# Patient Record
Sex: Male | Born: 1955 | Race: White | Hispanic: No | Marital: Single | Smoking: Current every day smoker
Health system: Southern US, Community
[De-identification: ages and names within clinical notes are randomized; demographics above are authoritative.]

## PROBLEM LIST (undated history)

## (undated) DIAGNOSIS — I319 Disease of pericardium, unspecified: Secondary | ICD-10-CM

## (undated) DIAGNOSIS — I5020 Unspecified systolic (congestive) heart failure: Secondary | ICD-10-CM

## (undated) DIAGNOSIS — J984 Other disorders of lung: Secondary | ICD-10-CM

## (undated) DIAGNOSIS — Z7901 Long term (current) use of anticoagulants: Secondary | ICD-10-CM

## (undated) DIAGNOSIS — N400 Enlarged prostate without lower urinary tract symptoms: Secondary | ICD-10-CM

## (undated) DIAGNOSIS — I639 Cerebral infarction, unspecified: Secondary | ICD-10-CM

## (undated) DIAGNOSIS — M5030 Other cervical disc degeneration, unspecified cervical region: Secondary | ICD-10-CM

## (undated) DIAGNOSIS — I7123 Aneurysm of the descending thoracic aorta, without rupture: Secondary | ICD-10-CM

## (undated) DIAGNOSIS — F329 Major depressive disorder, single episode, unspecified: Secondary | ICD-10-CM

## (undated) DIAGNOSIS — Q245 Malformation of coronary vessels: Secondary | ICD-10-CM

## (undated) DIAGNOSIS — E785 Hyperlipidemia, unspecified: Secondary | ICD-10-CM

## (undated) DIAGNOSIS — M51379 Other intervertebral disc degeneration, lumbosacral region without mention of lumbar back pain or lower extremity pain: Secondary | ICD-10-CM

## (undated) DIAGNOSIS — M419 Scoliosis, unspecified: Secondary | ICD-10-CM

## (undated) DIAGNOSIS — F32A Depression, unspecified: Secondary | ICD-10-CM

## (undated) DIAGNOSIS — I499 Cardiac arrhythmia, unspecified: Secondary | ICD-10-CM

## (undated) DIAGNOSIS — Z87442 Personal history of urinary calculi: Secondary | ICD-10-CM

## (undated) DIAGNOSIS — R51 Headache: Secondary | ICD-10-CM

## (undated) DIAGNOSIS — I712 Thoracic aortic aneurysm, without rupture, unspecified: Secondary | ICD-10-CM

## (undated) DIAGNOSIS — M4320 Fusion of spine, site unspecified: Secondary | ICD-10-CM

## (undated) DIAGNOSIS — R519 Headache, unspecified: Secondary | ICD-10-CM

## (undated) DIAGNOSIS — M1990 Unspecified osteoarthritis, unspecified site: Secondary | ICD-10-CM

## (undated) DIAGNOSIS — B1920 Unspecified viral hepatitis C without hepatic coma: Secondary | ICD-10-CM

## (undated) DIAGNOSIS — K759 Inflammatory liver disease, unspecified: Secondary | ICD-10-CM

## (undated) DIAGNOSIS — N529 Male erectile dysfunction, unspecified: Secondary | ICD-10-CM

## (undated) DIAGNOSIS — A310 Pulmonary mycobacterial infection: Secondary | ICD-10-CM

## (undated) DIAGNOSIS — T8859XA Other complications of anesthesia, initial encounter: Secondary | ICD-10-CM

## (undated) DIAGNOSIS — Z8619 Personal history of other infectious and parasitic diseases: Secondary | ICD-10-CM

## (undated) DIAGNOSIS — J449 Chronic obstructive pulmonary disease, unspecified: Secondary | ICD-10-CM

## (undated) DIAGNOSIS — K5790 Diverticulosis of intestine, part unspecified, without perforation or abscess without bleeding: Secondary | ICD-10-CM

## (undated) DIAGNOSIS — J189 Pneumonia, unspecified organism: Secondary | ICD-10-CM

## (undated) DIAGNOSIS — I739 Peripheral vascular disease, unspecified: Secondary | ICD-10-CM

## (undated) DIAGNOSIS — F319 Bipolar disorder, unspecified: Secondary | ICD-10-CM

## (undated) DIAGNOSIS — E46 Unspecified protein-calorie malnutrition: Secondary | ICD-10-CM

## (undated) DIAGNOSIS — I2510 Atherosclerotic heart disease of native coronary artery without angina pectoris: Secondary | ICD-10-CM

## (undated) DIAGNOSIS — M069 Rheumatoid arthritis, unspecified: Secondary | ICD-10-CM

## (undated) DIAGNOSIS — I700 Atherosclerosis of aorta: Secondary | ICD-10-CM

## (undated) DIAGNOSIS — E119 Type 2 diabetes mellitus without complications: Secondary | ICD-10-CM

## (undated) DIAGNOSIS — T4145XA Adverse effect of unspecified anesthetic, initial encounter: Secondary | ICD-10-CM

## (undated) DIAGNOSIS — I4891 Unspecified atrial fibrillation: Secondary | ICD-10-CM

## (undated) DIAGNOSIS — K219 Gastro-esophageal reflux disease without esophagitis: Secondary | ICD-10-CM

## (undated) DIAGNOSIS — I7143 Infrarenal abdominal aortic aneurysm, without rupture: Secondary | ICD-10-CM

## (undated) DIAGNOSIS — I10 Essential (primary) hypertension: Secondary | ICD-10-CM

## (undated) DIAGNOSIS — Z8611 Personal history of tuberculosis: Secondary | ICD-10-CM

## (undated) DIAGNOSIS — F17200 Nicotine dependence, unspecified, uncomplicated: Secondary | ICD-10-CM

## (undated) DIAGNOSIS — R0600 Dyspnea, unspecified: Secondary | ICD-10-CM

## (undated) HISTORY — DX: Major depressive disorder, single episode, unspecified: F32.9

## (undated) HISTORY — PX: OTHER SURGICAL HISTORY: SHX169

## (undated) HISTORY — DX: Depression, unspecified: F32.A

## (undated) HISTORY — DX: Hyperlipidemia, unspecified: E78.5

## (undated) HISTORY — DX: Bipolar disorder, unspecified: F31.9

## (undated) HISTORY — DX: Fusion of spine, site unspecified: M43.20

## (undated) HISTORY — PX: VASECTOMY: SHX75

## (undated) HISTORY — DX: Unspecified osteoarthritis, unspecified site: M19.90

## (undated) HISTORY — DX: Unspecified viral hepatitis C without hepatic coma: B19.20

---

## 2002-09-03 HISTORY — PX: BACK SURGERY: SHX140

## 2002-09-03 HISTORY — PX: ANTERIOR CERVICAL DECOMP/DISCECTOMY FUSION: SHX1161

## 2005-02-14 ENCOUNTER — Ambulatory Visit: Payer: Self-pay | Admitting: Pain Medicine

## 2005-02-27 ENCOUNTER — Ambulatory Visit: Payer: Self-pay | Admitting: Pain Medicine

## 2005-03-13 ENCOUNTER — Ambulatory Visit: Payer: Self-pay | Admitting: Pain Medicine

## 2005-04-03 ENCOUNTER — Ambulatory Visit: Payer: Self-pay | Admitting: Physician Assistant

## 2005-04-19 ENCOUNTER — Ambulatory Visit: Payer: Self-pay | Admitting: Family Medicine

## 2005-05-02 ENCOUNTER — Emergency Department: Payer: Self-pay | Admitting: Internal Medicine

## 2005-05-24 ENCOUNTER — Ambulatory Visit (HOSPITAL_COMMUNITY): Admission: RE | Admit: 2005-05-24 | Discharge: 2005-05-24 | Payer: Self-pay | Admitting: Neurosurgery

## 2005-06-12 ENCOUNTER — Ambulatory Visit (HOSPITAL_COMMUNITY): Admission: RE | Admit: 2005-06-12 | Discharge: 2005-06-13 | Payer: Self-pay | Admitting: Neurosurgery

## 2005-07-04 ENCOUNTER — Encounter: Admission: RE | Admit: 2005-07-04 | Discharge: 2005-07-04 | Payer: Self-pay | Admitting: Neurosurgery

## 2007-04-28 IMAGING — CR DG CHEST 2V
1 series · 2 of 2 positions shown · non-contrast
Comparison: none

REASON FOR EXAM: COUGH
COMMENTS:

[Series 1076: postero_anterior · 0.11mm/px · 2 of 2 slices shown]
[im 1/2]
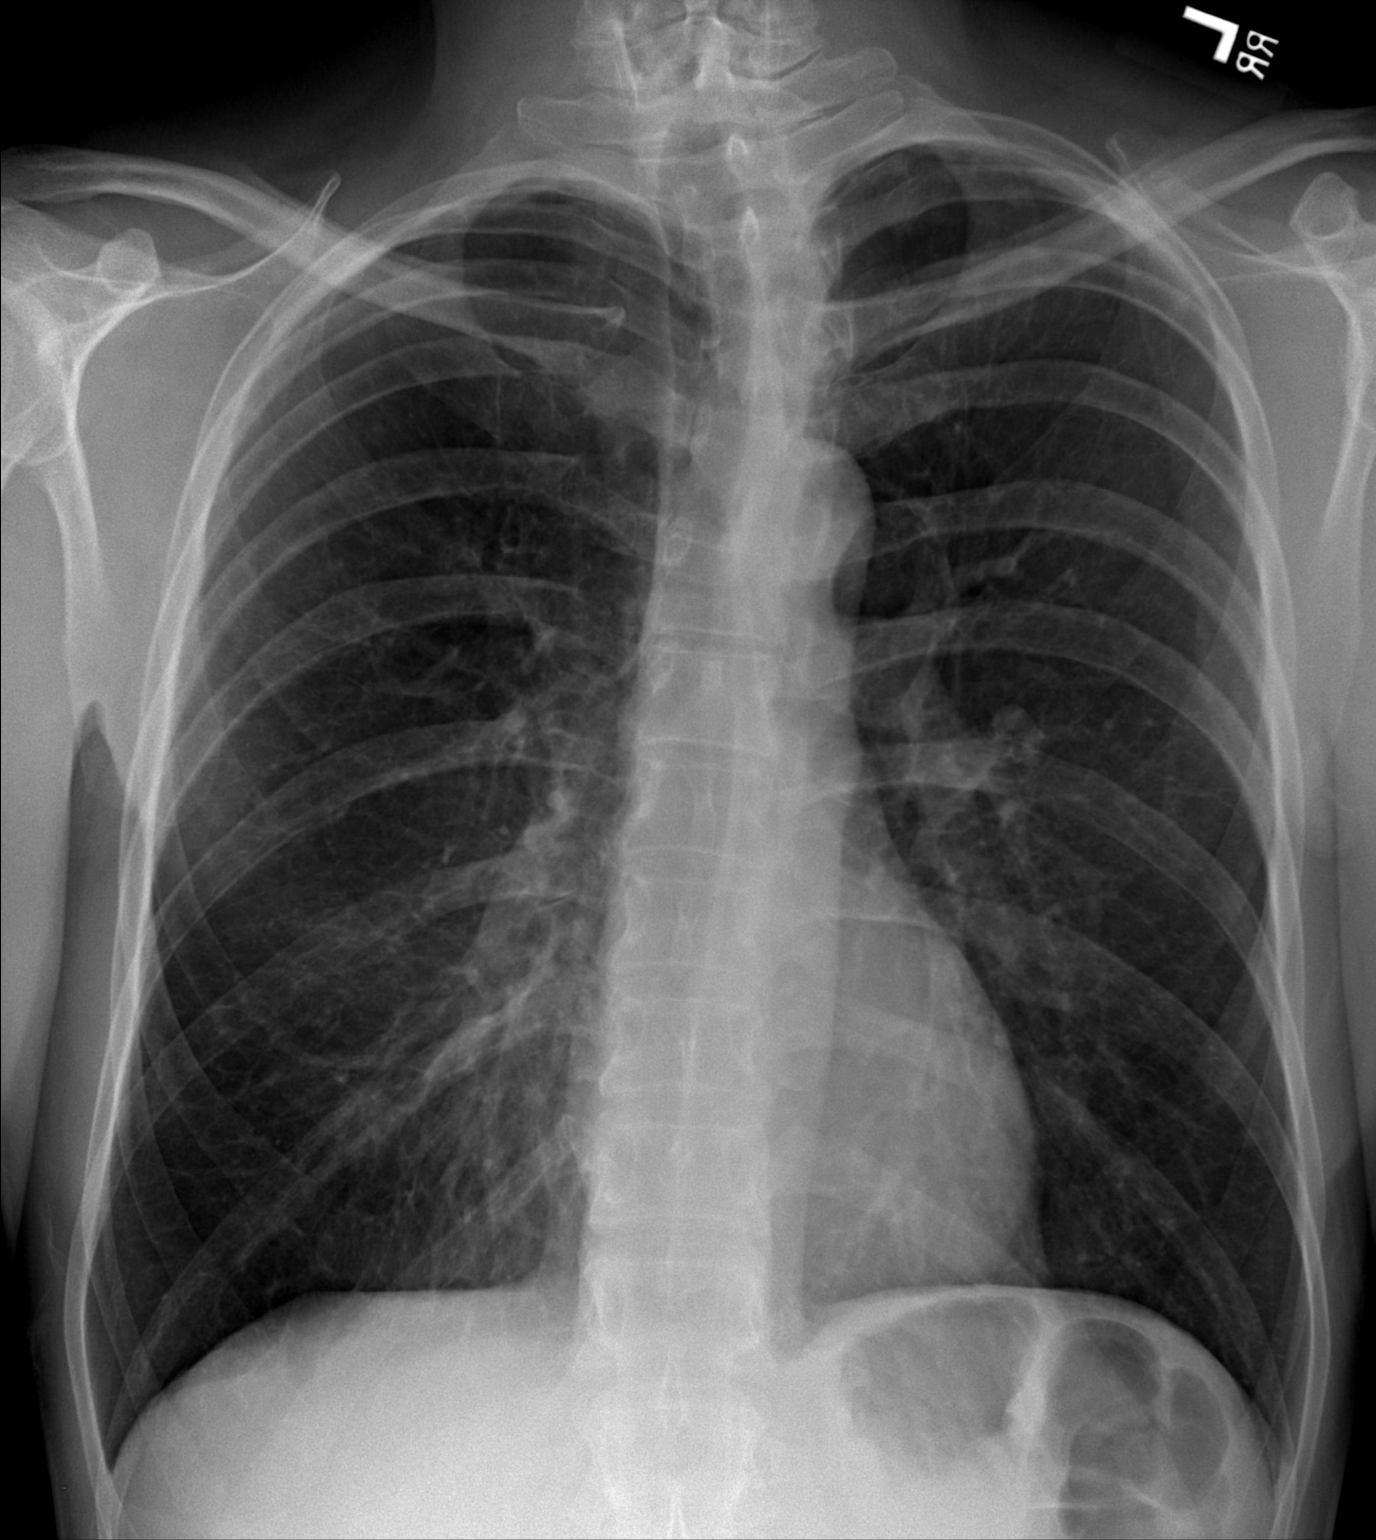
[im 2/2]
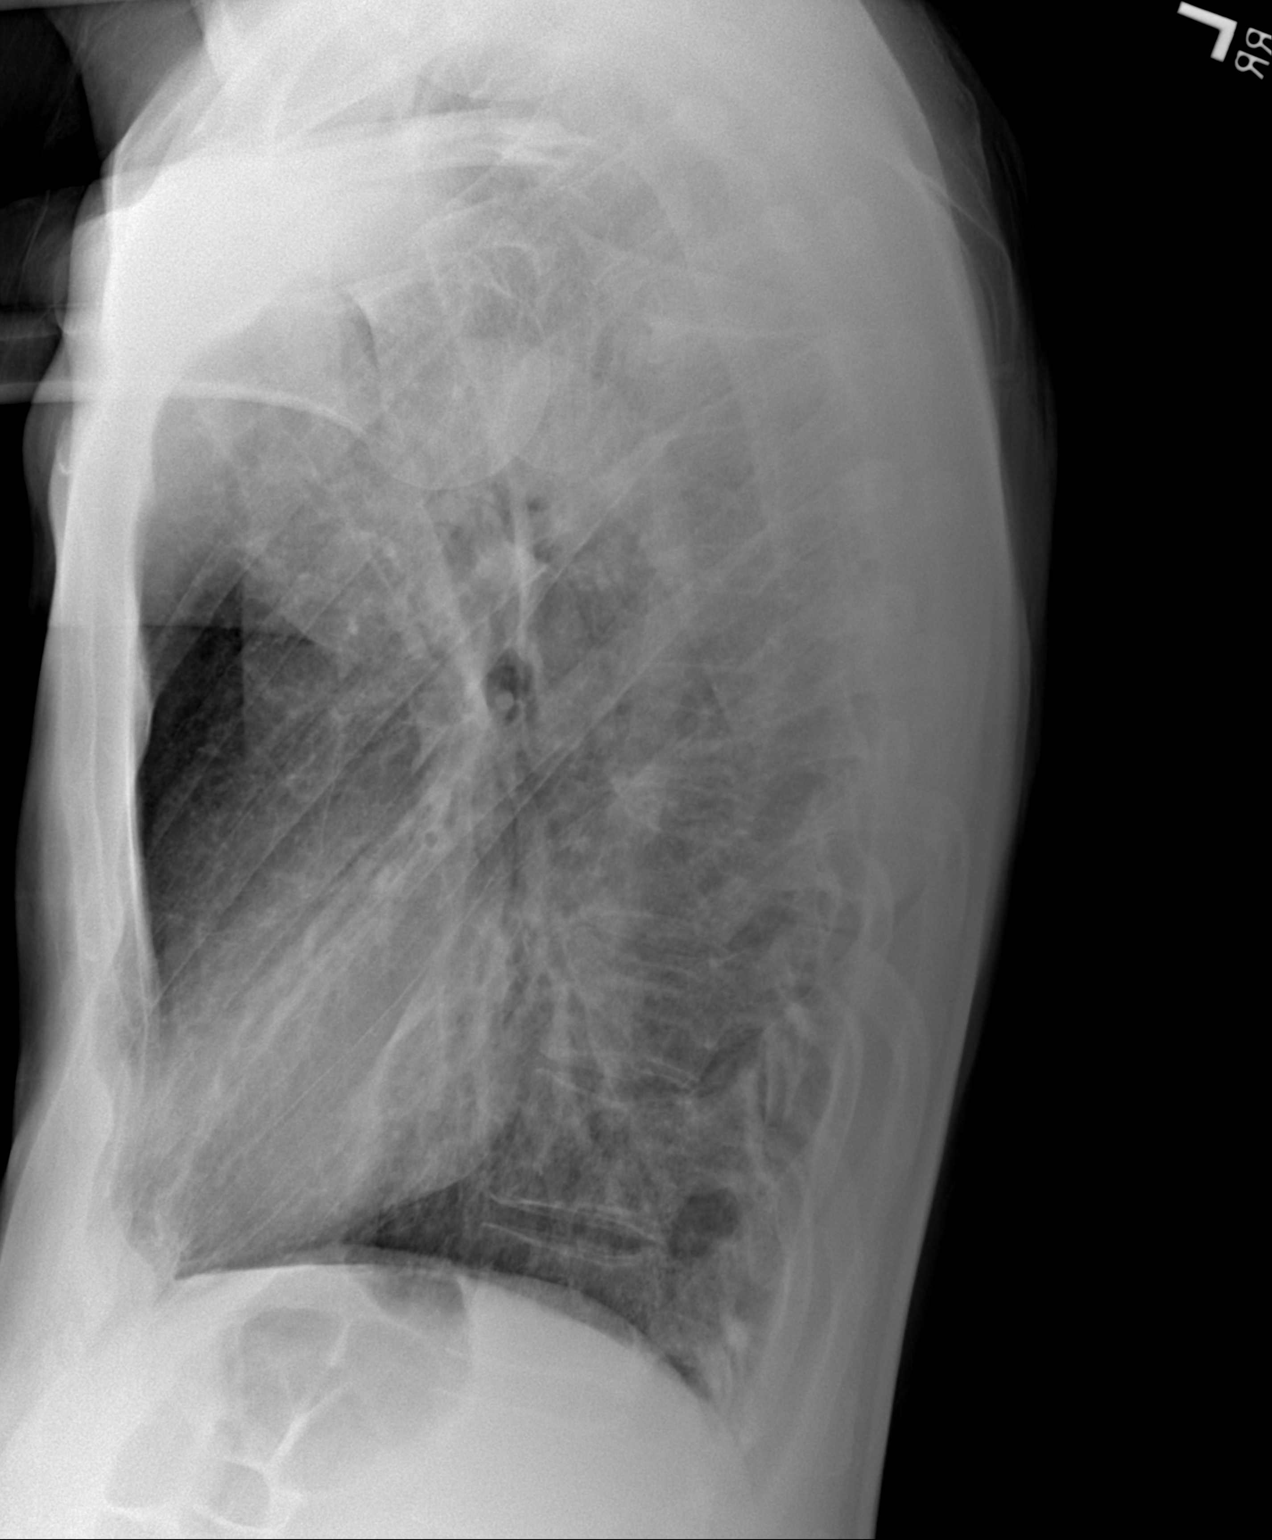

[2 of 2 positions shown; findings below may reference images not displayed]

PROCEDURE:     DXR - DXR CHEST PA (OR AP) AND LATERAL  - April 19, 2005  [DATE]

RESULT:        The lung fields are clear.  No pneumonia, pneumothorax or
pleural effusion is seen.  The chest appears hyperexpanded compatible with a
history of COPD or asthma.   The heart and pulmonary vasculature show no
significant abnormalities.  There is a slight thoracic scoliosis.
IMPRESSION: 1.     The lung fields are clear.
2.     The chest is mildly hyperexpanded compatible with a history of asthma
or COPD.

## 2007-05-14 ENCOUNTER — Ambulatory Visit: Payer: Self-pay | Admitting: Pain Medicine

## 2007-05-23 ENCOUNTER — Ambulatory Visit: Payer: Self-pay | Admitting: Pain Medicine

## 2007-06-02 ENCOUNTER — Ambulatory Visit: Payer: Self-pay | Admitting: Pain Medicine

## 2009-05-06 ENCOUNTER — Emergency Department: Payer: Self-pay | Admitting: Emergency Medicine

## 2009-10-10 ENCOUNTER — Inpatient Hospital Stay: Payer: Self-pay | Admitting: Psychiatry

## 2010-02-02 ENCOUNTER — Inpatient Hospital Stay: Payer: Self-pay | Admitting: Psychiatry

## 2011-04-26 ENCOUNTER — Encounter: Payer: Self-pay | Admitting: Anesthesiology

## 2011-05-05 ENCOUNTER — Encounter: Payer: Self-pay | Admitting: Anesthesiology

## 2013-05-26 DIAGNOSIS — G8929 Other chronic pain: Secondary | ICD-10-CM | POA: Diagnosis not present

## 2013-05-26 DIAGNOSIS — R5381 Other malaise: Secondary | ICD-10-CM | POA: Diagnosis not present

## 2013-05-26 DIAGNOSIS — F312 Bipolar disorder, current episode manic severe with psychotic features: Secondary | ICD-10-CM | POA: Diagnosis not present

## 2013-05-26 DIAGNOSIS — M542 Cervicalgia: Secondary | ICD-10-CM | POA: Diagnosis not present

## 2013-05-27 DIAGNOSIS — R5381 Other malaise: Secondary | ICD-10-CM | POA: Diagnosis not present

## 2013-05-27 DIAGNOSIS — R946 Abnormal results of thyroid function studies: Secondary | ICD-10-CM | POA: Diagnosis not present

## 2013-06-23 DIAGNOSIS — M542 Cervicalgia: Secondary | ICD-10-CM | POA: Diagnosis not present

## 2013-06-23 DIAGNOSIS — G540 Brachial plexus disorders: Secondary | ICD-10-CM | POA: Diagnosis not present

## 2013-06-23 DIAGNOSIS — G541 Lumbosacral plexus disorders: Secondary | ICD-10-CM | POA: Diagnosis not present

## 2013-06-23 DIAGNOSIS — M545 Low back pain: Secondary | ICD-10-CM | POA: Diagnosis not present

## 2013-06-23 DIAGNOSIS — G608 Other hereditary and idiopathic neuropathies: Secondary | ICD-10-CM | POA: Diagnosis not present

## 2013-06-23 DIAGNOSIS — R209 Unspecified disturbances of skin sensation: Secondary | ICD-10-CM | POA: Diagnosis not present

## 2013-06-23 DIAGNOSIS — Z79899 Other long term (current) drug therapy: Secondary | ICD-10-CM | POA: Diagnosis not present

## 2013-06-23 DIAGNOSIS — M961 Postlaminectomy syndrome, not elsewhere classified: Secondary | ICD-10-CM | POA: Diagnosis not present

## 2013-06-26 DIAGNOSIS — N529 Male erectile dysfunction, unspecified: Secondary | ICD-10-CM | POA: Diagnosis not present

## 2013-06-26 DIAGNOSIS — F312 Bipolar disorder, current episode manic severe with psychotic features: Secondary | ICD-10-CM | POA: Diagnosis not present

## 2013-06-26 DIAGNOSIS — M539 Dorsopathy, unspecified: Secondary | ICD-10-CM | POA: Diagnosis not present

## 2013-06-26 DIAGNOSIS — B009 Herpesviral infection, unspecified: Secondary | ICD-10-CM | POA: Diagnosis not present

## 2013-07-02 ENCOUNTER — Encounter: Payer: Self-pay | Admitting: Anesthesiology

## 2013-07-02 DIAGNOSIS — M542 Cervicalgia: Secondary | ICD-10-CM | POA: Diagnosis not present

## 2013-07-02 DIAGNOSIS — M2560 Stiffness of unspecified joint, not elsewhere classified: Secondary | ICD-10-CM | POA: Diagnosis not present

## 2013-07-02 DIAGNOSIS — M62838 Other muscle spasm: Secondary | ICD-10-CM | POA: Diagnosis not present

## 2013-07-02 DIAGNOSIS — IMO0001 Reserved for inherently not codable concepts without codable children: Secondary | ICD-10-CM | POA: Diagnosis not present

## 2013-07-04 ENCOUNTER — Encounter: Payer: Self-pay | Admitting: Anesthesiology

## 2013-07-04 DIAGNOSIS — IMO0001 Reserved for inherently not codable concepts without codable children: Secondary | ICD-10-CM | POA: Diagnosis not present

## 2013-07-04 DIAGNOSIS — M542 Cervicalgia: Secondary | ICD-10-CM | POA: Diagnosis not present

## 2013-07-04 DIAGNOSIS — M62838 Other muscle spasm: Secondary | ICD-10-CM | POA: Diagnosis not present

## 2013-07-04 DIAGNOSIS — M2560 Stiffness of unspecified joint, not elsewhere classified: Secondary | ICD-10-CM | POA: Diagnosis not present

## 2013-07-10 DIAGNOSIS — M5030 Other cervical disc degeneration, unspecified cervical region: Secondary | ICD-10-CM | POA: Diagnosis not present

## 2013-07-22 DIAGNOSIS — M961 Postlaminectomy syndrome, not elsewhere classified: Secondary | ICD-10-CM | POA: Diagnosis not present

## 2013-07-22 DIAGNOSIS — G541 Lumbosacral plexus disorders: Secondary | ICD-10-CM | POA: Diagnosis not present

## 2013-07-22 DIAGNOSIS — M5030 Other cervical disc degeneration, unspecified cervical region: Secondary | ICD-10-CM | POA: Diagnosis not present

## 2013-07-22 DIAGNOSIS — S139XXA Sprain of joints and ligaments of unspecified parts of neck, initial encounter: Secondary | ICD-10-CM | POA: Diagnosis not present

## 2013-07-22 DIAGNOSIS — H81399 Other peripheral vertigo, unspecified ear: Secondary | ICD-10-CM | POA: Diagnosis not present

## 2013-07-22 DIAGNOSIS — Z9181 History of falling: Secondary | ICD-10-CM | POA: Diagnosis not present

## 2013-07-22 DIAGNOSIS — G608 Other hereditary and idiopathic neuropathies: Secondary | ICD-10-CM | POA: Diagnosis not present

## 2013-07-22 DIAGNOSIS — G540 Brachial plexus disorders: Secondary | ICD-10-CM | POA: Diagnosis not present

## 2013-07-28 DIAGNOSIS — R6889 Other general symptoms and signs: Secondary | ICD-10-CM | POA: Diagnosis not present

## 2013-08-03 ENCOUNTER — Encounter: Payer: Self-pay | Admitting: Anesthesiology

## 2013-08-11 DIAGNOSIS — M542 Cervicalgia: Secondary | ICD-10-CM | POA: Diagnosis not present

## 2013-08-19 DIAGNOSIS — Z79899 Other long term (current) drug therapy: Secondary | ICD-10-CM | POA: Diagnosis not present

## 2013-08-19 DIAGNOSIS — M5137 Other intervertebral disc degeneration, lumbosacral region: Secondary | ICD-10-CM | POA: Diagnosis not present

## 2013-08-19 DIAGNOSIS — M542 Cervicalgia: Secondary | ICD-10-CM | POA: Diagnosis not present

## 2013-08-19 DIAGNOSIS — G608 Other hereditary and idiopathic neuropathies: Secondary | ICD-10-CM | POA: Diagnosis not present

## 2013-08-19 DIAGNOSIS — M545 Low back pain: Secondary | ICD-10-CM | POA: Diagnosis not present

## 2013-08-19 DIAGNOSIS — M961 Postlaminectomy syndrome, not elsewhere classified: Secondary | ICD-10-CM | POA: Diagnosis not present

## 2013-08-19 DIAGNOSIS — S335XXA Sprain of ligaments of lumbar spine, initial encounter: Secondary | ICD-10-CM | POA: Diagnosis not present

## 2013-09-15 DIAGNOSIS — M542 Cervicalgia: Secondary | ICD-10-CM | POA: Diagnosis not present

## 2013-09-16 DIAGNOSIS — R51 Headache: Secondary | ICD-10-CM | POA: Diagnosis not present

## 2013-09-16 DIAGNOSIS — M542 Cervicalgia: Secondary | ICD-10-CM | POA: Diagnosis not present

## 2013-10-26 DIAGNOSIS — J209 Acute bronchitis, unspecified: Secondary | ICD-10-CM | POA: Diagnosis not present

## 2014-07-06 DIAGNOSIS — R300 Dysuria: Secondary | ICD-10-CM | POA: Diagnosis not present

## 2014-08-11 DIAGNOSIS — B182 Chronic viral hepatitis C: Secondary | ICD-10-CM | POA: Diagnosis not present

## 2014-08-12 ENCOUNTER — Ambulatory Visit: Payer: Self-pay | Admitting: Gastroenterology

## 2014-08-12 DIAGNOSIS — K8020 Calculus of gallbladder without cholecystitis without obstruction: Secondary | ICD-10-CM | POA: Diagnosis not present

## 2014-08-12 DIAGNOSIS — B182 Chronic viral hepatitis C: Secondary | ICD-10-CM | POA: Diagnosis not present

## 2014-08-12 DIAGNOSIS — B1920 Unspecified viral hepatitis C without hepatic coma: Secondary | ICD-10-CM | POA: Diagnosis not present

## 2014-09-03 DIAGNOSIS — Z87442 Personal history of urinary calculi: Secondary | ICD-10-CM

## 2014-09-03 HISTORY — DX: Personal history of urinary calculi: Z87.442

## 2014-11-18 DIAGNOSIS — B182 Chronic viral hepatitis C: Secondary | ICD-10-CM | POA: Diagnosis not present

## 2014-11-23 ENCOUNTER — Emergency Department: Payer: Self-pay | Admitting: Emergency Medicine

## 2014-11-23 DIAGNOSIS — Z720 Tobacco use: Secondary | ICD-10-CM | POA: Diagnosis not present

## 2014-11-23 DIAGNOSIS — N23 Unspecified renal colic: Secondary | ICD-10-CM | POA: Diagnosis not present

## 2014-11-23 DIAGNOSIS — N201 Calculus of ureter: Secondary | ICD-10-CM | POA: Diagnosis not present

## 2014-11-23 DIAGNOSIS — N400 Enlarged prostate without lower urinary tract symptoms: Secondary | ICD-10-CM | POA: Diagnosis not present

## 2014-11-23 DIAGNOSIS — N132 Hydronephrosis with renal and ureteral calculous obstruction: Secondary | ICD-10-CM | POA: Diagnosis not present

## 2015-05-26 ENCOUNTER — Telehealth: Payer: Self-pay

## 2015-05-26 ENCOUNTER — Other Ambulatory Visit: Payer: Self-pay

## 2015-05-26 DIAGNOSIS — B182 Chronic viral hepatitis C: Secondary | ICD-10-CM

## 2015-05-26 NOTE — Telephone Encounter (Signed)
-----   Message from Otilio Jefferson sent at 02/21/2015  2:23 PM EDT ----- Regarding: Re: Result Follow-up Sent: 05/23/2015   Office Visit: 11/18/2014  Call pt to remind him he needs his viral load repeated. GF  > From: Delilah Mulgrew > To: Ceola Para > Sent: 11/23/2014 3:11 PM > Pt notified. Will contact pt in 6 months for a repeat HCV viral load. GF  > From: Lucilla Lame MD > To: Lamarkus Nebel > Sent: 11/22/2014 6:12 PM > Let the patient know the HCV was negative.

## 2015-05-26 NOTE — Telephone Encounter (Signed)
Called and advise pt he is due for his repeat viral load and Hepatic function. Order faxed to [facility] on [address] per pt request. He will go tomorrow.

## 2015-06-01 ENCOUNTER — Telehealth: Payer: Self-pay

## 2015-06-01 NOTE — Telephone Encounter (Signed)
Returned [employer] call regarding this pt's repeat labs. Advised him I have faxed [facility] ([address]) the lab order and informed pt he needs to get this done.

## 2015-06-01 NOTE — Telephone Encounter (Signed)
-----   Message from Maudie Flakes sent at 05/25/2015  2:05 PM EDT ----- Call Alveta Heimlich from [employer] at [phone] x [phone]

## 2015-06-08 DIAGNOSIS — B182 Chronic viral hepatitis C: Secondary | ICD-10-CM | POA: Diagnosis not present

## 2015-06-10 ENCOUNTER — Telehealth: Payer: Self-pay

## 2015-06-10 NOTE — Telephone Encounter (Signed)
Pt notified of labs results for HCV viral load and Hepatic function.

## 2015-12-23 ENCOUNTER — Ambulatory Visit (INDEPENDENT_AMBULATORY_CARE_PROVIDER_SITE_OTHER): Payer: Medicare Other | Admitting: Family Medicine

## 2015-12-23 ENCOUNTER — Encounter: Payer: Self-pay | Admitting: Family Medicine

## 2015-12-23 VITALS — BP 130/72 | HR 95 | Temp 98.8°F | Resp 16 | Ht 74.0 in | Wt 178.6 lb

## 2015-12-23 DIAGNOSIS — E785 Hyperlipidemia, unspecified: Secondary | ICD-10-CM | POA: Diagnosis not present

## 2015-12-23 DIAGNOSIS — N529 Male erectile dysfunction, unspecified: Secondary | ICD-10-CM | POA: Diagnosis not present

## 2015-12-23 DIAGNOSIS — Z125 Encounter for screening for malignant neoplasm of prostate: Secondary | ICD-10-CM

## 2015-12-23 DIAGNOSIS — M79672 Pain in left foot: Secondary | ICD-10-CM

## 2015-12-23 DIAGNOSIS — Z8619 Personal history of other infectious and parasitic diseases: Secondary | ICD-10-CM | POA: Diagnosis not present

## 2015-12-23 MED ORDER — SILDENAFIL CITRATE 20 MG PO TABS: ORAL_TABLET | ORAL | Status: DC

## 2015-12-23 NOTE — Assessment & Plan Note (Signed)
Treated with harvoni by Dr. Allen Norris.

## 2015-12-23 NOTE — Patient Instructions (Signed)
Erectile Dysfunction  Erectile dysfunction is the inability to get or sustain a good enough erection to have sexual intercourse. Erectile dysfunction may involve:   Inability to get an erection.   Lack of enough hardness to allow penetration.   Loss of the erection before sex is finished.   Premature ejaculation.  CAUSES   Certain drugs, such as:    Pain relievers.    Antihistamines.    Antidepressants.    Blood pressure medicines.    Water pills (diuretics).    Ulcer medicines.    Muscle relaxants.    Illegal drugs.   Excessive drinking.   Psychological causes, such as:    Anxiety.    Depression.    Sadness.    Exhaustion.    Performance fear.    Stress.   Physical causes, such as:    Artery problems. This may include diabetes, smoking, liver disease, or atherosclerosis.    High blood pressure.    Hormonal problems, such as low testosterone.    Obesity.    Nerve problems. This may include back or pelvic injuries, diabetes mellitus, multiple sclerosis, or Parkinson disease.  SYMPTOMS   Inability to get an erection.   Lack of enough hardness to allow penetration.   Loss of the erection before sex is finished.   Premature ejaculation.   Normal erections at some times, but with frequent unsatisfactory episodes.   Orgasms that are not satisfactory in sensation or frequency.   Low sexual satisfaction in either partner because of erection problems.   A curved penis occurring with erection. The curve may cause pain or may be too curved to allow for intercourse.   Never having nighttime erections.  DIAGNOSIS  Your caregiver can often diagnose this condition by:   Performing a physical exam to find other diseases or specific problems with the penis.   Asking you detailed questions about the problem.   Performing blood tests to check for diabetes mellitus or to measure hormone levels.   Performing urine tests to find other underlying health conditions.   Performing an ultrasound exam to check for  scarring.   Performing a test to check blood flow to the penis.   Doing a sleep study at home to measure nighttime erections.  TREATMENT    You may be prescribed medicines by mouth.   You may be given medicine injections into the penis.   You may be prescribed a vacuum pump with a ring.   Penile implant surgery may be performed. You may receive:    An inflatable implant.    A semirigid implant.   Blood vessel surgery may be performed.  HOME CARE INSTRUCTIONS   If you are prescribed oral medicine, you should take the medicine as prescribed. Do not increase the dosage without first discussing it with your physician.   If you are using self-injections, be careful to avoid any veins that are on the surface of the penis. Apply pressure to the injection site for 5 minutes.   If you are using a vacuum pump, make sure you have read the instructions before using it. Discuss any questions with your physician before taking the pump home.  SEEK MEDICAL CARE IF:   You experience pain that is not responsive to the pain medicine you have been prescribed.   You experience nausea or vomiting.  SEEK IMMEDIATE MEDICAL CARE IF:    When taking oral or injectable medications, you experience an erection that lasts longer than 4 hours. If your   physician is unavailable, go to the nearest emergency room for evaluation. An erection that lasts much longer than 4 hours can result in permanent damage to your penis.   You have pain that is severe.   You develop redness, severe pain, or severe swelling of your penis.   You have redness spreading up into your groin or lower abdomen.   You are unable to pass your urine.     This information is not intended to replace advice given to you by your health care provider. Make sure you discuss any questions you have with your health care provider.     Document Released: 08/17/2000 Document Revised: 04/22/2013 Document Reviewed: 01/22/2013  Elsevier Interactive Patient Education 2016  [employer]vier [employer]

## 2015-12-23 NOTE — Assessment & Plan Note (Signed)
Symptoms consistent with Morton's neuroma. Pt had seen podiatry in the past- will refer there today.

## 2015-12-23 NOTE — Progress Notes (Signed)
Subjective:    Patient ID: Ricardo Folks Sr., male    DOB: [birth year], 58 y.o.   MRN: [id number]  HPI: Ricardo ILACQUA Sr. is a 61 y.o. male presenting on 12/23/2015 for Foot Pain and Erectile Dysfunction   HPI  Pt presents for foot pain- L foot. Symptoms have been present for about 6 years- but has not had it looked at in several year.  He would like a referral to a podiatrist. Was previously getting injections in the foot. L foot is stabbing pain. Has difficulty wearing shoes. No pattern to pain. Hurts more after wearing shoes.   Erectile dysfunction: Has trouble maintaining an erection. Has been slowly depreciating over past 5 years. Libidio is present. Does have history of BPH. No chest pain or SOB. No known heart issues.   Treated for Hep C- with harvoni by Dr. Allen Norris last year. 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 Not currently seeing psychiatry for depression or bipolar disorder.   Past Medical History  Diagnosis Date  . Hyperlipidemia   . Bipolar disorder ([place])   . Depression   . Fusion of spine   . Hepatitis C   . Chronic kidney disease     kidney stone   Social History   Social History  . Marital Status: Married    Spouse Name: N/A  . Number of Children: N/A  . Years of Education: N/A   Occupational History  . Not on file.   Social History Main Topics  . Smoking status: Current Every Day Smoker -- 0.50 packs/day for 0 years  . Smokeless tobacco: Not on file  . Alcohol Use: Yes  . Drug Use: No  . Sexual Activity: Not on file   Other Topics Concern  . Not on file   Social History Narrative  . No narrative on file   Family History  Problem Relation Age of Onset  . Cancer Mother     liver  . Heart disease Father   . Diabetes Father    No current outpatient prescriptions on file prior to visit.   No current facility-administered medications on file prior to visit.    Review of Systems  Constitutional: Negative for fever and chills.  Genitourinary: Negative for  urgency and frequency.       Erectile dysfunction   Musculoskeletal: Positive for arthralgias (foot pain).   Per HPI unless specifically indicated above     Objective:    BP 130/72 mmHg  Pulse 95  Temp(Src) 98.8 F (37.1 C) (Oral)  Resp 16  Ht 6\' 2"  (1.88 m)  Wt 178 lb 9.6 oz (81.012 kg)  BMI 22.92 kg/m2  Wt Readings from Last 3 Encounters:  12/23/15 178 lb 9.6 oz (81.012 kg)    Physical Exam  Constitutional: He is oriented to person, place, and time. He appears well-developed and well-nourished. No distress.  HENT:  Head: Normocephalic and atraumatic.  Neck: Neck supple. No thyromegaly present.  Cardiovascular: Normal rate, regular rhythm and normal heart sounds.  Exam reveals no gallop and no friction rub.   No murmur heard. Pulmonary/Chest: Effort normal and breath sounds normal. He has no wheezes.  Abdominal: Soft. Bowel sounds are normal. He exhibits no distension. There is no tenderness. There is no rebound.  Musculoskeletal: Normal range of motion. He exhibits no edema or tenderness.       Left foot: There is normal range of motion, no tenderness, no bony tenderness and no swelling.  Neurological: He is alert and  oriented to person, place, and time. He has normal strength and normal reflexes.  Skin: Skin is warm and dry. No rash noted. No erythema.  Psychiatric: He has a normal mood and affect. His behavior is normal. Thought content normal.   No results found for this or any previous visit.    Assessment & Plan:   Problem List Items Addressed This Visit      Genitourinary   Erectile dysfunction - Primary    Check lipid panel, PSA, CMET. Last Testosterone was normal Trial of sildenafil. Nonpharmacologic interventions reviewed. Risks of medication reviewed. Side effects reviewed.       Relevant Medications   sildenafil (REVATIO) 20 MG tablet   Other Relevant Orders   PSA     Other   Left foot pain    Symptoms consistent with Morton's neuroma. Pt had seen  podiatry in the past- will refer there today.       Relevant Orders   Ambulatory referral to Podiatry   History of hepatitis C    Treated with harvoni by Dr. Allen Norris.       Relevant Orders   Comprehensive metabolic panel    Other Visit Diagnoses    Screening for prostate cancer        Relevant Orders    PSA    Mild hyperlipidemia        Relevant Medications    sildenafil (REVATIO) 20 MG tablet    Other Relevant Orders    Lipid Profile       Meds ordered this encounter  Medications  . sildenafil (REVATIO) 20 MG tablet    Sig: Take 3-5 pills as needed prior to sex.    Dispense:  30 tablet    Refill:  0    Order Specific Question:  Supervising Provider    Answer:  Arlis Porta [id number]      Follow up plan: Return in about 4 weeks (around 01/20/2016), or if symptoms worsen or fail to improve, for pending labwork. [name]

## 2015-12-23 NOTE — Assessment & Plan Note (Addendum)
Check lipid panel, PSA, CMET. Last Testosterone was normal Trial of sildenafil. Nonpharmacologic interventions reviewed. Risks of medication reviewed. Side effects reviewed.

## 2015-12-24 LAB — COMPREHENSIVE METABOLIC PANEL
ALBUMIN: 4.5 g/dL (ref 3.5–5.5)
ALT: 14 IU/L (ref 0–44)
AST: 18 IU/L (ref 0–40)
Albumin/Globulin Ratio: 1.6 (ref 1.2–2.2)
Alkaline Phosphatase: 81 IU/L (ref 39–117)
BILIRUBIN TOTAL: 0.6 mg/dL (ref 0.0–1.2)
BUN/Creatinine Ratio: 14 (ref 9–20)
BUN: 15 mg/dL (ref 6–24)
CO2: 24 mmol/L (ref 18–29)
Calcium: 9.5 mg/dL (ref 8.7–10.2)
Chloride: 96 mmol/L (ref 96–106)
Creatinine, Ser: 1.1 mg/dL (ref 0.76–1.27)
GFR calc Af Amer: 85 mL/min/{1.73_m2} (ref >59)
GFR calc non Af Amer: 74 mL/min/{1.73_m2} (ref >59)
GLUCOSE: 98 mg/dL (ref 65–99)
Globulin, Total: 2.8 g/dL (ref 1.5–4.5)
POTASSIUM: 4.1 mmol/L (ref 3.5–5.2)
Sodium: 139 mmol/L (ref 134–144)
Total Protein: 7.3 g/dL (ref 6.0–8.5)

## 2015-12-24 LAB — LIPID PANEL
Chol/HDL Ratio: 7.5 ratio units — ABNORMAL HIGH (ref 0.0–5.0)
Cholesterol, Total: 217 mg/dL — ABNORMAL HIGH (ref 100–199)
HDL: 29 mg/dL — ABNORMAL LOW (ref >39)
LDL Calculated: 138 mg/dL — ABNORMAL HIGH (ref 0–99)
Triglycerides: 248 mg/dL — ABNORMAL HIGH (ref 0–149)
VLDL CHOLESTEROL CAL: 50 mg/dL — AB (ref 5–40)

## 2015-12-24 LAB — PSA: PROSTATE SPECIFIC AG, SERUM: 3.8 ng/mL (ref 0.0–4.0)

## 2015-12-26 ENCOUNTER — Other Ambulatory Visit: Payer: Self-pay | Admitting: Family Medicine

## 2015-12-26 DIAGNOSIS — E785 Hyperlipidemia, unspecified: Secondary | ICD-10-CM

## 2015-12-26 MED ORDER — ATORVASTATIN CALCIUM 20 MG PO TABS: 20.0000 mg | ORAL_TABLET | Freq: Every day | ORAL | Status: DC

## 2016-01-19 ENCOUNTER — Ambulatory Visit (INDEPENDENT_AMBULATORY_CARE_PROVIDER_SITE_OTHER): Payer: Medicare Other

## 2016-01-19 ENCOUNTER — Encounter: Payer: Self-pay | Admitting: Podiatry

## 2016-01-19 ENCOUNTER — Ambulatory Visit (INDEPENDENT_AMBULATORY_CARE_PROVIDER_SITE_OTHER): Payer: Medicare Other | Admitting: Podiatry

## 2016-01-19 VITALS — BP 146/93 | HR 95 | Resp 18

## 2016-01-19 DIAGNOSIS — R52 Pain, unspecified: Secondary | ICD-10-CM

## 2016-01-19 DIAGNOSIS — D3610 Benign neoplasm of peripheral nerves and autonomic nervous system, unspecified: Secondary | ICD-10-CM

## 2016-01-19 DIAGNOSIS — G5762 Lesion of plantar nerve, left lower limb: Secondary | ICD-10-CM

## 2016-01-19 NOTE — Progress Notes (Signed)
   Subjective:    Patient ID: Ricardo Crute Folks Sr., Ricardo Cervantes    DOB: 1956-04-28, 60 y.o.   MRN: [id number]  HPI  60 year old Ricardo Cervantes presents the office today for concerns of a "pinched nerve". He states that he had a previous alcohol sclerosing injectio into the third interspace of left foot which did help however due to insurance issues he did not follow-up. Since then over the last couple years the pain has worsened. Denies any recent injury or trauma. He presents today requesting to "deaden the nerve". No other complaints.   Review of Systems  All other systems reviewed and are negative.      Objective:   Physical Exam General: AAO x3, NAD  Dermatological: Skin is warm, dry and supple bilateral. Nails x 10 are well manicured; remaining integument appears unremarkable at this time. There are no open sores, no preulcerative lesions, no rash or signs of infection present.  Vascular: Dorsalis Pedis artery and Posterior Tibial artery pedal pulses are 2/4 bilateral with immedate capillary fill time. Pedal hair growth present. No varicosities and no lower extremity edema present bilateral. There is no pain with calf compression, swelling, warmth, erythema.   Neruologic: Grossly intact via light touch bilateral. Vibratory intact via tuning fork bilateral. Protective threshold with Semmes Wienstein monofilament intact to all pedal sites bilateral. Patellar and Achilles deep tendon reflexes 2+ bilateral. No Babinski or clonus noted bilateral.   Musculoskeletal: Tenderness present in the 3rd interspace on the left foot and a neuroma is palpable and there is reproduction of numbness/thigling to the 3rd and 4th digits. No area of pinpointment bony tenderness. Muscular strength 5/5 in all groups tested bilateral.  Gait: Unassisted, Nonantalgic.      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 No evidence of acute fracture or stress fracture.  -Discussed both conservative versus surgical treatment options. This point elected to proceed with out call sclerosing injection understanding risks and complications. Today he underwent his first injection without any complications of the total 1 mL. Postoperative condition care was discussed. Neuroma pads were also dispensed. Follow up in 3 weeks or sooner if needed. Call if questions concerns the meantime.  Celesta Gentile, DPM

## 2016-02-09 ENCOUNTER — Ambulatory Visit (INDEPENDENT_AMBULATORY_CARE_PROVIDER_SITE_OTHER): Payer: Medicare Other | Admitting: Podiatry

## 2016-02-09 ENCOUNTER — Encounter: Payer: Self-pay | Admitting: Podiatry

## 2016-02-09 ENCOUNTER — Telehealth: Payer: Self-pay | Admitting: Family Medicine

## 2016-02-09 DIAGNOSIS — D3610 Benign neoplasm of peripheral nerves and autonomic nervous system, unspecified: Secondary | ICD-10-CM

## 2016-02-09 DIAGNOSIS — N529 Male erectile dysfunction, unspecified: Secondary | ICD-10-CM

## 2016-02-09 DIAGNOSIS — G5762 Lesion of plantar nerve, left lower limb: Secondary | ICD-10-CM | POA: Diagnosis not present

## 2016-02-09 MED ORDER — SILDENAFIL CITRATE 20 MG PO TABS: ORAL_TABLET | ORAL | Status: DC

## 2016-02-09 NOTE — Telephone Encounter (Signed)
Done

## 2016-02-09 NOTE — Telephone Encounter (Signed)
Pt needs a refill on sildenafil 20 mg sent to [facility].  His call back number is [phone]

## 2016-02-13 NOTE — Progress Notes (Signed)
Patient ID: Matthew Folks Sr., male   DOB: 1956/06/22, 60 y.o.   MRN: [id number]  Subjective: 60 year old male presents the office they for follow-up evaluation of left third interspace neuroma. He has had no significant improvement of the first alcohol injection however is not worse. He states he has not been wearing shoes he has not had any other treatments. Denies any systemic complaints such as fevers, chills, nausea, vomiting. No acute changes since last appointment, and no other complaints at this time.   Objective: AAO x3, NAD DP/PT pulses palpable bilaterally, CRT less than 3 seconds Continuation tenderness of the third interspace of left foot and there is a neuroma palpable. There is numbness and tingling subjective to the third and fourth toes. There is clicking sensation upon palpation of the interspace. No pinpoint bony tenderness.  MMT 5/5, ROM WNL. No edema, erythema, increase in warmth to bilateral lower extremities.  No open lesions or pre-ulcerative lesions.  No pain with calf compression, swelling, warmth, erythema  Assessment: Left third interspace neuroma  Plan: -All treatment options discussed with the patient including all alternatives, risks, complications.  -He wishes to undergo second dehydrated alcohol sclerosing injection. Today he underwent a second injection for total once cc without complications. Post injection care was discussed. Discussed with him to wear shoes as well as the neuroma pads. -Follow-up 3 weeks or sooner if needed -Patient encouraged to call the office with any questions, concerns, change in symptoms.   Celesta Gentile, DPM

## 2016-03-01 ENCOUNTER — Encounter: Payer: Self-pay | Admitting: Podiatry

## 2016-03-01 ENCOUNTER — Ambulatory Visit (INDEPENDENT_AMBULATORY_CARE_PROVIDER_SITE_OTHER): Payer: Medicare Other | Admitting: Podiatry

## 2016-03-01 DIAGNOSIS — D3610 Benign neoplasm of peripheral nerves and autonomic nervous system, unspecified: Secondary | ICD-10-CM

## 2016-03-01 NOTE — Progress Notes (Signed)
Patient ID: Ricardo Folks Sr., male   DOB: July 20, 1956, 60 y.o.   MRN: [id number]  Subjective: 60 year old male presents the office they for follow-up evaluation of left third interspace neuroma. He states that he is doing better and is not as tender. No swelling or redness. No recent injury or trauma. No swelling. Denies any systemic complaints such as fevers, chills, nausea, vomiting. No acute changes since last appointment, and no other complaints at this time.   Objective: AAO x3, NAD DP/PT pulses palpable bilaterally, CRT less than 3 seconds Decreased tenderness of the third interspace of left foot. There is numbness and tingling subjective to the third and fourth toes but this is improved as well. There is clicking sensation upon palpation of the interspace. No pinpoint bony tenderness.  MMT 5/5, ROM WNL. No edema, erythema, increase in warmth to bilateral lower extremities.  No open lesions or pre-ulcerative lesions.  No pain with calf compression, swelling, warmth, erythema  Assessment: Left third interspace neuroma  Plan: -All treatment options discussed with the patient including all alternatives, risks, complications.  -He wishes to undergo 3rd dehydrated alcohol sclerosing injection. Today he underwent a 3rd injection for total 1cc without complications. Post injection care was discussed. Discussed with him to wear shoes as well as the neuroma pads. -Follow-up 3 weeks or sooner if needed -Patient encouraged to call the office with any questions, concerns, change in symptoms.   Celesta Gentile, DPM

## 2016-03-07 ENCOUNTER — Ambulatory Visit
Admission: RE | Admit: 2016-03-07 | Discharge: 2016-03-07 | Disposition: A | Discharge status: Home / Self Care | Payer: Medicare Other | Source: Ambulatory Visit | Attending: Family Medicine | Admitting: Family Medicine

## 2016-03-07 ENCOUNTER — Ambulatory Visit (INDEPENDENT_AMBULATORY_CARE_PROVIDER_SITE_OTHER): Payer: Medicare Other | Admitting: Family Medicine

## 2016-03-07 VITALS — BP 122/91 | HR 84 | Temp 97.9°F | Resp 16 | Ht 74.0 in | Wt 178.0 lb

## 2016-03-07 DIAGNOSIS — M5090 Cervical disc disorder, unspecified, unspecified cervical region: Secondary | ICD-10-CM | POA: Diagnosis not present

## 2016-03-07 DIAGNOSIS — M7542 Impingement syndrome of left shoulder: Secondary | ICD-10-CM | POA: Insufficient documentation

## 2016-03-07 DIAGNOSIS — G8929 Other chronic pain: Secondary | ICD-10-CM | POA: Diagnosis not present

## 2016-03-07 DIAGNOSIS — M542 Cervicalgia: Secondary | ICD-10-CM

## 2016-03-07 DIAGNOSIS — M25512 Pain in left shoulder: Secondary | ICD-10-CM | POA: Diagnosis not present

## 2016-03-07 MED ORDER — PREDNISONE 10 MG PO TABS: ORAL_TABLET | ORAL | Status: DC

## 2016-03-07 MED ORDER — NAPROXEN 500 MG PO TABS: 500.0000 mg | ORAL_TABLET | Freq: Two times a day (BID) | ORAL | Status: DC

## 2016-03-07 MED ORDER — CYCLOBENZAPRINE HCL 10 MG PO TABS
10.0000 mg | ORAL_TABLET | Freq: Three times a day (TID) | ORAL | Status: DC | PRN
Start: 2016-03-07 — End: 2017-01-01

## 2016-03-07 NOTE — Patient Instructions (Signed)
Neck pain: We will get an XR to make sure nothing changed. Take flexeril as needed for muscle pain- it will make you sleepy- so please don't take and then drive. We have also referred you to physical therapy- they will contact you to make appt.   Shoulder pain: We will get an XR of your shoulder. Try prednisone to help with pain. Then start taking naproxen twice daily as needed. We will also refer you to orthopedics to have your shoulder evaluated.

## 2016-03-07 NOTE — Progress Notes (Signed)
Subjective:    Patient ID: Ricardo Folks Sr., male    DOB: 06-21-1956, 60 y.o.   MRN: [id number]  HPI: Ricardo BENOIT Sr. is a 60 y.o. male presenting on 03/07/2016 for Shoulder Pain   HPI Pt presents for neck and shoulder pain. Has had shoulder issues for 3 mos since a fall down the stairs. Caught his arm on the railing.Caught himself with L arm on railing. Doesn't remember if he felt a lock/click/ pop. Did twist the arm. Cannot lift the arm above shoulder height. Pain is getting worse.   Neck issues are chronic- 15 years or more. Having more pain and HA. 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 Has numbness tingling in L arm- present x 10 years. No worsening. Neck pain is worsening. Pain is constant. Takes advil to help with HA but does not impact the pain. Initial surgery was done at [facility] neurosurgery and spine.  Past Medical History  Diagnosis Date  . Hyperlipidemia   . Bipolar disorder ([place])   . Depression   . Fusion of spine   . Hepatitis C   . Chronic kidney disease     kidney stone    Current Outpatient Prescriptions on File Prior to Visit  Medication Sig  . sildenafil (REVATIO) 20 MG tablet Take 3-5 pills as needed prior to sex.   No current facility-administered medications on file prior to visit.    Review of Systems  Constitutional: Negative for fever and chills.  HENT: Negative.   Respiratory: Negative for chest tightness, shortness of breath and wheezing.   Cardiovascular: Negative for chest pain, palpitations and leg swelling.  Gastrointestinal: Negative for nausea, vomiting and abdominal pain.  Endocrine: Negative.   Genitourinary: Negative for dysuria, urgency, discharge, penile pain and testicular pain.  Musculoskeletal: Positive for arthralgias and neck pain. Negative for back pain and joint swelling.  Skin: Negative.   Neurological: Negative for dizziness, weakness, numbness and headaches.  Psychiatric/Behavioral:  Negative for sleep disturbance and dysphoric mood.   Per HPI unless specifically indicated above     Objective:    BP 122/91 mmHg  Pulse 84  Temp(Src) 97.9 F (36.6 C) (Oral)  Resp 16  Ht 6\' 2"  (1.88 m)  Wt 178 lb (80.74 kg)  BMI 22.84 kg/m2  Wt Readings from Last 3 Encounters:  03/07/16 178 lb (80.74 kg)  12/23/15 178 lb 9.6 oz (81.012 kg)    Physical Exam  Constitutional: He is oriented to person, place, and time. He appears well-developed and well-nourished. No distress.  HENT:  Head: Normocephalic and atraumatic.  Neck: Neck supple. Muscular tenderness (base of the neck R side. ) present. No spinous process tenderness present. No rigidity. Decreased range of motion (unable to fully extend neck 2/2 previous surgery. ) present. No edema and no erythema present. No thyromegaly present.  Cardiovascular: Normal rate, regular rhythm and normal heart sounds.  Exam reveals no gallop and no friction rub.   No murmur heard. Pulmonary/Chest: Effort normal and breath sounds normal. He has no wheezes.  Abdominal: Soft. Bowel sounds are normal. He exhibits no distension. There is no tenderness. There is no rebound.  Musculoskeletal: He exhibits no edema.       Right shoulder: Normal.       Left shoulder: He exhibits decreased range of motion and tenderness. He exhibits no bony tenderness, no swelling, no crepitus and normal strength.  + painful arc test.  + Neer test.   Neurological:  He is alert and oriented to person, place, and time. He has normal reflexes.  Skin: Skin is warm and dry. No rash noted. No erythema.  Psychiatric: He has a normal mood and affect. His behavior is normal. Thought content normal.   Results for orders placed or performed in visit on 12/23/15  Comprehensive metabolic panel  Result Value Ref Range   Glucose 98 65 - 99 mg/dL   BUN 15 6 - 24 mg/dL   Creatinine, Ser 1.10 0.76 - 1.27 mg/dL   GFR calc non Af Amer 74 >59 mL/min/1.73   GFR calc Af Amer 85 >59  mL/min/1.73   BUN/Creatinine Ratio 14 9 - 20   Sodium 139 134 - 144 mmol/L   Potassium 4.1 3.5 - 5.2 mmol/L   Chloride 96 96 - 106 mmol/L   CO2 24 18 - 29 mmol/L   Calcium 9.5 8.7 - 10.2 mg/dL   Total Protein 7.3 6.0 - 8.5 g/dL   Albumin 4.5 3.5 - 5.5 g/dL   Globulin, Total 2.8 1.5 - 4.5 g/dL   Albumin/Globulin Ratio 1.6 1.2 - 2.2   Bilirubin Total 0.6 0.0 - 1.2 mg/dL   Alkaline Phosphatase 81 39 - 117 IU/L   AST 18 0 - 40 IU/L   ALT 14 0 - 44 IU/L  Lipid Profile  Result Value Ref Range   Cholesterol, Total 217 (H) 100 - 199 mg/dL   Triglycerides 248 (H) 0 - 149 mg/dL   HDL 29 (L) >39 mg/dL   VLDL Cholesterol Cal 50 (H) 5 - 40 mg/dL   LDL Calculated 138 (H) 0 - 99 mg/dL   Chol/HDL Ratio 7.5 (H) 0.0 - 5.0 ratio units  PSA  Result Value Ref Range   Prostate Specific Ag, Serum 3.8 0.0 - 4.0 ng/mL      Assessment & Plan:   Problem List Items Addressed This Visit    None    Visit Diagnoses    Shoulder impingement, left    -  Primary    XR to r/o fracture from fall. Prednisone for pain. Refer to ortho for management.     Relevant Medications    predniSONE (DELTASONE) 10 MG tablet    Other Relevant Orders    DG Shoulder Left (Completed)    Ambulatory referral to Orthopedic Surgery    Cervical disc disease        XR to check for progression. Start PT for pain. Naproxen as needed for pain management.     Relevant Medications    naproxen (NAPROSYN) 500 MG tablet    cyclobenzaprine (FLEXERIL) 10 MG tablet    Other Relevant Orders    DG Cervical Spine 2 or 3 views (Completed)    Ambulatory referral to Physical Therapy    Ambulatory referral to Orthopedic Surgery    Neck pain, chronic        PT for pain management.     Relevant Medications    predniSONE (DELTASONE) 10 MG tablet    naproxen (NAPROSYN) 500 MG tablet    cyclobenzaprine (FLEXERIL) 10 MG tablet    Other Relevant Orders    Ambulatory referral to Physical Therapy       Meds ordered this encounter  Medications   . predniSONE (DELTASONE) 10 MG tablet    Sig: Take 6 pills by mouth day 1, 5 pills day 2, 4 pills day 3, 3 pills day 4, 2 pills day 5, 1 pill day 6. STOP.    Dispense:  21 tablet  Refill:  0    Order Specific Question:  Supervising Provider    Answer:  Arlis Porta [id number]  . naproxen (NAPROSYN) 500 MG tablet    Sig: Take 1 tablet (500 mg total) by mouth 2 (two) times daily with a meal.    Dispense:  30 tablet    Refill:  0    Order Specific Question:  Supervising Provider    Answer:  Arlis Porta [phone]  . cyclobenzaprine (FLEXERIL) 10 MG tablet    Sig: Take 1 tablet (10 mg total) by mouth 3 (three) times daily as needed for muscle spasms.    Dispense:  30 tablet    Refill:  0    Order Specific Question:  Supervising Provider    Answer:  Arlis Porta [id number]      Follow up plan: Return if symptoms worsen or fail to improve.

## 2016-03-08 ENCOUNTER — Ambulatory Visit: Payer: Medicare Other | Attending: Family Medicine

## 2016-03-08 DIAGNOSIS — M6281 Muscle weakness (generalized): Secondary | ICD-10-CM | POA: Insufficient documentation

## 2016-03-08 DIAGNOSIS — M542 Cervicalgia: Secondary | ICD-10-CM | POA: Diagnosis not present

## 2016-03-08 NOTE — Patient Instructions (Signed)
  Upper Cervical Flexion / Extension   Sit with lumbar towel roll to promote better posture.   Do not follow first picture.    Give yourself a double chin. Hold ___5_ seconds. Repeat _10___ times per set. Do 3____ sets per session. Do ___1_ sessions per day.  http://orth.exer.us/351   Copyright  VHI. All rights reserved.     Place 2 tennis balls in a sock at the base of your head while lying down on your back in bed.  Hold for 2 min to 5 min.

## 2016-03-08 NOTE — Therapy (Signed)
[place] [facility] [address], [place], [address] Phone: [phone]   Fax:  [phone]  Physical Therapy Evaluation  Patient Details  Name: Ricardo HUGHART Sr. MRN: [id number] Date of Birth: April 20, 1956 Referring Provider: Luciana Axe, NP  Encounter Date: 03/08/2016      PT End of Session - 03/08/16 1456    Visit Number 1   Number of Visits 13   Date for PT Re-Evaluation 04/19/16   Authorization Type 1   Authorization Time Period of 10   PT Start Time 1456   PT Stop Time 1600   PT Time Calculation (min) 64 min   Activity Tolerance Patient tolerated treatment well   Behavior During Therapy [facility] for tasks assessed/performed      Past Medical History  Diagnosis Date  . Hyperlipidemia   . Bipolar disorder ([place])   . Depression   . Fusion of spine   . Hepatitis C   . Chronic kidney disease     kidney stone  . Arthritis     Rheumatoid    Past Surgical History  Procedure Laterality Date  . Back surgery  2004    There were no vitals filed for this visit.       Subjective Assessment - 03/08/16 1459    Subjective Neck pain: 9/10 at worst. 5/10 currently (nagging pain; pt currently sitting). Patient also adds having posterior headaches from the base of his skull.    Pertinent History Cervical disc disease, chronic neck pain. Gradual onset. 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 Had C6/C7 fusion 10 years ago which did not help with the pain. The bones have not fused. The reason for the fusion was deteriorating discs and bone spurs. Had and x-ray yesterday which did not reveal any changes. Pt does not know what that means. Pt states feeling numbness in his L 4th and 5th digits.  Pt also adds feeling L anterior shoulder, tricep pain and numbess at his 4th and 5th digits prior to his fall 3 months ago. Pt slipped off the step and slowed his fall with his L hand  on the rail and injured his L shoulder.  Pt also adds that he has gone to PT before and has found some improvement with the PT working out some knots in his neck, as well as using the TENS unit.  Pt adds that he fractured his L scapula 30 years ago.   Hx of C6/C7 fusion   Patient Stated Goals Feel a little bit better.    Currently in Pain? Yes   Pain Score 5    Pain Location Neck   Pain Descriptors / Indicators Aching;Stabbing;Numbness;Nagging   Pain Type Chronic pain   Pain Onset More than a month ago   Pain Frequency Constant   Aggravating Factors  weather, looking to the L, looking up.            [facility] PT Assessment - 03/08/16 1513    Assessment   Medical Diagnosis Cervical disc disease; chronic neck pain   Referring Provider Amy Overton Mam, NP   Onset Date/Surgical Date 03/07/16  Date PT referral signed. Chronic condition   Prior Therapy Pt had prior PT which involved manual therapy and E-stim which helped.    Precautions   Precaution Comments C6/C7 fusion   Restrictions   Other Position/Activity Restrictions No known restrictions   Balance Screen   Has the  patient fallen in the past 6 months Yes   How many times? 1  3 months ago. Please see subjective    Has the patient had a decrease in activity level because of a fear of falling?  No   Is the patient reluctant to leave their home because of a fear of falling?  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    AROM   Cervical Flexion Full   Cervical Extension limited with pain,  movement preference around C5/C6 area   Cervical - Right Side Bend limited   Cervical - Left Side Bend limited with a feeling of restriction L posterior neck   Cervical - Right Rotation WFL   Cervical - Left Rotation limited with L posterior cervical and central neck pain   Strength   Right Shoulder Flexion 4+/5   Right Shoulder External Rotation 4+/5   Left Shoulder Flexion 4+/5   Left Shoulder External Rotation 4/5   Right Elbow Flexion 4+/5   Right Elbow Extension 5/5   Left Elbow Flexion 4/5   Left Elbow Extension 4-/5  secondary to L shoulder pain   Palpation   Palpation comment Increased R rhomboid muscle tone compared to L, TTP base of neck        Objectives  Manual therapy   STM to L and R rhomboid muscle   Improved L cervical rotation and extension with less pain.   There-ex  Directed patient with seated chin tucks 10x with scapular retraction Then chin tucks sitting on a chair with lumbar towel support for more upright posture 5x (felt better for pt) Supine suboccipital release using 2 tennis balls x 3 min  Reviewed HEP. Pt demonstrated and verbalized understanding.   Improved exercise technique, movement at target joints, use of target muscles after mod verbal, visual, tactile cues.                    PT Education - 03/08/16 1940    Education provided Yes   Education Details Ther-ex, HEP, plan of care   Person(s) Educated Patient   Methods Explanation;Demonstration;Tactile cues;Verbal cues;Handout   Comprehension Returned demonstration;Verbalized understanding             PT Long Term Goals - 03/08/16 1948    PT LONG TERM GOAL #1   Title Patient will have a decrease in neck pain to 5/10 or less at worst to promote ability to look up as well as to the L.    Baseline 9/10 neck pain at worst.    Time 6   Period Weeks   Status New   PT LONG TERM GOAL #2   Title Patient will improve his Neck Disability Index Score by at least 7 points  as a demonstration of improved function.    Baseline 40%   Time 6   Period Weeks   Status New   PT LONG TERM GOAL #3   Title Patient will improve bilateral UE strength to promote ability to perform functional tasks.    Time 6  Period Weeks   Status New   PT LONG TERM GOAL #4   Title Patient will be able to perform cervical extension as well as L cervical rotation with minimal to no complain of neck pain.    Baseline Pain with cervical extension as well as L cervical rotation.   Time 6   Period Weeks   Status New               Plan - 03/08/16 1942    Clinical Impression Statement Patient is a 59 year old male who came to physical therapy secondary to chronic neck pain. He also presents with movement preference to around the C5/C6 area, bilaterally protracted shoulders and neck posture, limited cerivcal AROM with reproduction of symptoms with cervical extension and L rotation; UE weakness, and difficulty performing activities which involve looking up or to the L. Patient will benefit from skilled physical therapy services to address the aforementioned deficits.    Rehab Potential Good   Clinical Impairments Affecting Rehab Potential Chronicity of condition, L UE symptoms which might have worsened from his fall 3 months ago   PT Frequency 2x / week   PT Duration 6 weeks   PT Treatment/Interventions Therapeutic exercise;Therapeutic activities;Manual techniques;Aquatic Therapy;Electrical Stimulation;Iontophoresis 4mg /ml Dexamethasone;Neuromuscular re-education;Dry needling;Patient/family education   PT Next Visit Plan manual therapy, posture, scapular strengthening, pectoral stretches   Consulted and Agree with Plan of Care Patient      Patient will benefit from skilled therapeutic intervention in order to improve the following deficits and impairments:  Pain, Decreased strength, Postural dysfunction  Visit Diagnosis: Cervicalgia - Plan: PT plan of care cert/re-cert  Muscle  weakness (generalized) - Plan: PT plan of care cert/re-cert      G-Codes - [id number] 1956    Functional Assessment Tool Used Neck Disability Index, clinical presentation, patient interview   Functional Limitation Carrying, moving and handling objects   Carrying, Moving and Handling Objects Current Status [id number]) At least 40 percent but less than 60 percent impaired, limited or restricted   Carrying, Moving and Handling Objects Goal Status [id number]) At least 1 percent but less than 20 percent impaired, limited or restricted       Problem List Patient Active Problem List   Diagnosis Date Noted  . Neuroma 01/19/2016  . Erectile dysfunction 12/23/2015  . Left foot pain 12/23/2015  . History of hepatitis C 12/23/2015   Thank you for your referral.  Joneen Boers PT, DPT   03/08/2016, 8:13 PM  [place] [facility] [address] [place], [address] Phone: [phone]   Fax:  [phone]  Name: Ricardo MORDECAI Sr. MRN: [id number] Date of Birth: Apr 30, 1956

## 2016-03-12 ENCOUNTER — Ambulatory Visit: Payer: Medicare Other

## 2016-03-12 DIAGNOSIS — M6281 Muscle weakness (generalized): Secondary | ICD-10-CM | POA: Diagnosis not present

## 2016-03-12 DIAGNOSIS — M542 Cervicalgia: Secondary | ICD-10-CM

## 2016-03-12 NOTE — Therapy (Signed)
[place] [facility] [address], [place], [address] Phone: [phone]   Fax:  [phone]  Physical Therapy Treatment  Patient Details  Name: Ricardo KARNITZ Sr. MRN: [id number] Date of Birth: Jan 16, 1956 Referring Provider: Luciana Axe, NP  Encounter Date: 03/12/2016      PT End of Session - 03/12/16 1647    Visit Number 2   Number of Visits 13   Date for PT Re-Evaluation 04/19/16   Authorization Type 2   Authorization Time Period of 10   PT Start Time 1647   PT Stop Time 1733   PT Time Calculation (min) 46 min   Activity Tolerance Patient tolerated treatment well   Behavior During Therapy [facility] for tasks assessed/performed      Past Medical History  Diagnosis Date  . Hyperlipidemia   . Bipolar disorder ([place])   . Depression   . Fusion of spine   . Hepatitis C   . Chronic kidney disease     kidney stone  . Arthritis     Rheumatoid    Past Surgical History  Procedure Laterality Date  . Back surgery  2004    There were no vitals filed for this visit.      Subjective Assessment - 03/12/16 1648    Subjective Neck is not real bad. Moderate. 6-7/10 neck pain currently.  Finds the most relief from physical therapy.    Pertinent History Cervical disc disease, chronic neck pain. Gradual onset. 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 Had C6/C7 fusion 10 years ago which did not help with the pain. The bones have not fused. The reason for the fusion was deteriorating discs and bone spurs. Had and x-ray yesterday which did not reveal any changes. Pt does not know what that means. Pt states feeling numbness in his L 4th and 5th digits.  Pt also adds feeling L anterior shoulder, tricep pain and numbess at his 4th and 5th digits prior to his fall 3 months ago. Pt slipped off the step and slowed his fall with his L hand on the rail and injured his L shoulder.  Pt also  adds that he has gone to PT before and has found some improvement with the PT working out some knots in his neck, as well as using the TENS unit.  Pt adds that he fractured his L scapula 30 years ago.   Hx of C6/C7 fusion   Patient Stated Goals Feel a little bit better.    Currently in Pain? Yes   Pain Score 7   6-7/10 neck pain   Pain Onset More than a month ago        Objectives  Manual therapy   STM to R rhomboid muscle  Improved L cervical rotation and extension with less pain. Manual suboccipital release Supine STM to suboccipital muscles   Decreased posterior cervical headache Supine STM to posterior upper and mid cervical spine  Decreased headache    There-ex  Directed patient with supine serratus reach with arms at 90 degrees flexion 10x2 each UE Standing bilateral rows with scapular retraction resisting red band 10x Standing L shoulder extension resisting red band 10x  Standing pulleys: shoulder flexion/extension with fixed cervical spine 10x2 each way to promote upper thoracic and lower cervical mobility Seated manually resisted L scapular retraction targeting the lower trap muscles 10x5 seconds  Slight decreased L UT muscle discomfort.  Improved exercise technique, movement at target joints, use of target muscles after mod verbal, visual, tactile cues.      Decreased pain with L cervical rotation after STM to R rhomboid muscle, and decreased posterior cervical headache after STM to posterior cervical spine muscles. Decreased L UT muscle discomfort after utilizing L lower trap muscle.            PT Education - 03/12/16 1852    Education provided Yes   Education Details ther-ex   [employer]) Educated Patient   Methods Explanation;Demonstration;Tactile cues;Verbal cues   Comprehension Verbalized understanding;Returned demonstration             PT Long Term Goals - 03/08/16 1948    PT LONG TERM GOAL #1   Title Patient will have a  decrease in neck pain to 5/10 or less at worst to promote ability to look up as well as to the L.    Baseline 9/10 neck pain at worst.    Time 6   Period Weeks   Status New   PT LONG TERM GOAL #2   Title Patient will improve his Neck Disability Index Score by at least 7 points as a demonstration of improved function.    Baseline 40%   Time 6   Period Weeks   Status New   PT LONG TERM GOAL #3   Title Patient will improve bilateral UE strength to promote ability to perform functional tasks.    Time 6   Period Weeks   Status New   PT LONG TERM GOAL #4   Title Patient will be able to perform cervical extension as well as L cervical rotation with minimal to no complain of neck pain.    Baseline Pain with cervical extension as well as L cervical rotation.   Time 6   Period Weeks   Status New               Plan - 03/12/16 1853    Clinical Impression Statement Decreased pain with L cervical rotation after STM to R rhomboid muscle, and decreased posterior cervical headache after STM to posterior cervical spine muscles. Decreased L UT muscle discomfort after utilizing L lower trap muscle.     Rehab Potential Good   Clinical Impairments Affecting Rehab Potential Chronicity of condition, L UE symptoms which might have worsened from his fall 3 months ago   PT Frequency 2x / week   PT Duration 6 weeks   PT Treatment/Interventions Therapeutic exercise;Therapeutic activities;Manual techniques;Aquatic Therapy;Electrical Stimulation;Iontophoresis 4mg /ml Dexamethasone;Neuromuscular re-education;Dry needling;Patient/family education   PT Next Visit Plan manual therapy, posture, scapular strengthening, pectoral stretches   Consulted and Agree with Plan of Care Patient      Patient will benefit from skilled therapeutic intervention in order to improve the following deficits and impairments:  Pain, Decreased strength, Postural dysfunction  Visit Diagnosis: Cervicalgia  Muscle weakness  (generalized)     Problem List Patient Active Problem List   Diagnosis Date Noted  . Neuroma 01/19/2016  . Erectile dysfunction 12/23/2015  . Left foot pain 12/23/2015  . History of hepatitis C 12/23/2015    Joneen Boers PT, DPT   03/12/2016, 7:00 PM  [place] [facility] [address], [place], [address] Phone: [phone]   Fax:  [phone]  Name: Ricardo GLUTH Sr. MRN: [id number] Date of Birth: [birth year]

## 2016-03-13 DIAGNOSIS — M5412 Radiculopathy, cervical region: Secondary | ICD-10-CM | POA: Diagnosis not present

## 2016-03-13 DIAGNOSIS — M542 Cervicalgia: Secondary | ICD-10-CM | POA: Diagnosis not present

## 2016-03-13 DIAGNOSIS — M7542 Impingement syndrome of left shoulder: Secondary | ICD-10-CM | POA: Diagnosis not present

## 2016-03-14 ENCOUNTER — Ambulatory Visit: Payer: Medicare Other

## 2016-03-14 DIAGNOSIS — M6281 Muscle weakness (generalized): Secondary | ICD-10-CM | POA: Diagnosis not present

## 2016-03-14 DIAGNOSIS — M542 Cervicalgia: Secondary | ICD-10-CM

## 2016-03-14 NOTE — Therapy (Signed)
[facility] [address] [place], [address] Phone: [phone]   Fax:  [phone]  Physical Therapy Treatment  Patient Details  Name: Ricardo MCGAHEE Sr. MRN: [id number] Date of Birth: 09-07-1955 Referring Provider: Luciana Axe, NP  Encounter Date: 03/14/2016      PT End of Session - 03/14/16 0954    Visit Number 3   Number of Visits 13   Date for PT Re-Evaluation 04/19/16   Authorization Type 3   Authorization Time Period of 10   PT Start Time 0954   PT Stop Time 1040   PT Time Calculation (min) 46 min   Activity Tolerance Patient tolerated treatment well   Behavior During Therapy [facility] for tasks assessed/performed      Past Medical History  Diagnosis Date  . Hyperlipidemia   . Bipolar disorder ([place])   . Depression   . Fusion of spine   . Hepatitis C   . Chronic kidney disease     kidney stone  . Arthritis     Rheumatoid    Past Surgical History  Procedure Laterality Date  . Back surgery  2004    There were no vitals filed for this visit.      Subjective Assessment - 03/14/16 0956    Subjective Neck is not too bad. A little loser than it was when I first started. 5/10 neck pain currently.    Pertinent History Cervical disc disease, chronic neck pain. Gradual onset. 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 Had C6/C7 fusion 10 years ago which did not help with the pain. The bones have not fused. The reason for the fusion was deteriorating discs and bone spurs. Had and x-ray yesterday which did not reveal any changes. Pt does not know what that means. Pt states feeling numbness in his L 4th and 5th digits.  Pt also adds feeling L anterior shoulder, tricep pain and numbess at his 4th and 5th digits prior to his fall 3 months ago. Pt slipped off the step and slowed his fall with his L hand on the rail and injured his L shoulder.  Pt also adds that  he has gone to PT before and has found some improvement with the PT working out some knots in his neck, as well as using the TENS unit.  Pt adds that he fractured his L scapula 30 years ago.   Hx of C6/C7 fusion   Patient Stated Goals Feel a little bit better.    Currently in Pain? Yes   Pain Score 5    Pain Onset More than a month ago                  Objectives  There-ex  Seated manually resisted L scapular retraction targeting the lower trap muscles 2x5 with 5 seconds then 10x5 seconds Supine chin tucks 5x5 seconds Standing bilateral rows resisting red band 10x5 seconds  Seated thoracic extension over chair 10x5 seconds for 2 sets Standing pulleys: shoulder flexion/extension with fixed cervical spine 10x2 each way to promote upper thoracic and lower cervical mobility  Improved exercise technique, movement at target joints, use of target muscles after mod verbal, visual, tactile cues.      Manual therapy   STM to R rhomboid muscle   Supine STM to suboccipital muscles  Supine STM to posterior upper and mid cervical spine   Slight decrease in symptoms after  manual therapy. Good upper thoracic extension felt with thoracic extension over chair and bilateral row exercises.            PT Education - 03/14/16 1909    Education provided Yes   Education Details ther-ex   [employer]) Educated Patient   Methods Explanation;Demonstration;Tactile cues;Verbal cues   Comprehension Verbalized understanding;Returned demonstration             PT Long Term Goals - 03/08/16 1948    PT LONG TERM GOAL #1   Title Patient will have a decrease in neck pain to 5/10 or less at worst to promote ability to look up as well as to the L.    Baseline 9/10 neck pain at worst.    Time 6   Period Weeks   Status New   PT LONG TERM GOAL #2   Title Patient will improve his Neck Disability Index Score by at least 7 points as a demonstration of improved  function.    Baseline 40%   Time 6   Period Weeks   Status New   PT LONG TERM GOAL #3   Title Patient will improve bilateral UE strength to promote ability to perform functional tasks.    Time 6   Period Weeks   Status New   PT LONG TERM GOAL #4   Title Patient will be able to perform cervical extension as well as L cervical rotation with minimal to no complain of neck pain.    Baseline Pain with cervical extension as well as L cervical rotation.   Time 6   Period Weeks   Status New               Plan - 03/14/16 1910    Clinical Impression Statement Slight decrease in symptoms after manual therapy. Good upper thoracic extension felt with thoracic extension over chair and bilateral row exercises.    Rehab Potential Good   Clinical Impairments Affecting Rehab Potential Chronicity of condition, L UE symptoms which might have worsened from his fall 3 months ago   PT Frequency 2x / week   PT Duration 6 weeks   PT Treatment/Interventions Therapeutic exercise;Therapeutic activities;Manual techniques;Aquatic Therapy;Electrical Stimulation;Iontophoresis 4mg /ml Dexamethasone;Neuromuscular re-education;Dry needling;Patient/family education   PT Next Visit Plan manual therapy, posture, scapular strengthening, pectoral stretches   Consulted and Agree with Plan of Care Patient      Patient will benefit from skilled therapeutic intervention in order to improve the following deficits and impairments:  Pain, Decreased strength, Postural dysfunction  Visit Diagnosis: Cervicalgia  Muscle weakness (generalized)     Problem List Patient Active Problem List   Diagnosis Date Noted  . Neuroma 01/19/2016  . Erectile dysfunction 12/23/2015  . Left foot pain 12/23/2015  . History of hepatitis C 12/23/2015    Joneen Boers PT, DPT    03/14/2016, 7:59 PM  [facility] [address], [place], [address] Phone:  [phone]   Fax:  [phone]  Name: Ricardo DAMM Sr. MRN: [id number] Date of Birth: 05/18/1956

## 2016-03-19 ENCOUNTER — Telehealth: Payer: Self-pay

## 2016-03-19 ENCOUNTER — Ambulatory Visit: Payer: Medicare Other

## 2016-03-19 NOTE — Telephone Encounter (Signed)
No show. Called patient who said that he forgot. Currently babysitting his grandchildren and it slipped his mind. Will be able to make it to his next appointment.

## 2016-03-22 ENCOUNTER — Ambulatory Visit: Payer: Medicare Other | Admitting: Podiatry

## 2016-03-22 ENCOUNTER — Ambulatory Visit: Payer: Medicare Other

## 2016-03-26 ENCOUNTER — Telehealth: Payer: Self-pay

## 2016-03-26 ENCOUNTER — Ambulatory Visit: Payer: Medicare Other

## 2016-03-26 NOTE — Telephone Encounter (Signed)
Called patient pertaining to his appointments. Pt states that he has to cancel the rest of his sessions because he is out of town due to family business. Will call back to reschedule when able.

## 2016-11-26 ENCOUNTER — Ambulatory Visit (INDEPENDENT_AMBULATORY_CARE_PROVIDER_SITE_OTHER): Payer: Medicare Other | Admitting: Nurse Practitioner

## 2016-11-26 ENCOUNTER — Encounter: Payer: Self-pay | Admitting: Nurse Practitioner

## 2016-11-26 VITALS — BP 121/88 | HR 88 | Ht 74.0 in | Wt 186.6 lb

## 2016-11-26 DIAGNOSIS — M79604 Pain in right leg: Secondary | ICD-10-CM

## 2016-11-26 DIAGNOSIS — Z87891 Personal history of nicotine dependence: Secondary | ICD-10-CM

## 2016-11-26 DIAGNOSIS — Z716 Tobacco abuse counseling: Secondary | ICD-10-CM

## 2016-11-26 DIAGNOSIS — Z720 Tobacco use: Secondary | ICD-10-CM | POA: Diagnosis not present

## 2016-11-26 DIAGNOSIS — F17200 Nicotine dependence, unspecified, uncomplicated: Secondary | ICD-10-CM

## 2016-11-26 NOTE — Progress Notes (Signed)
I have reviewed this encounter including the documentation in this note and/or discussed this patient with the provider, Cassell Smiles, AGPCNP-BC. I am certifying that I agree with the content of this note as supervising physician.  Nobie Putnam, [facility] 11/26/2016, 7:35 PM

## 2016-11-26 NOTE — Progress Notes (Addendum)
Subjective:    Patient ID: Ricardo Folks Sr., male    DOB: [birth year], 61 y.o.   MRN: [id number]  Ricardo MINASYAN Sr. is a 61 y.o. male presenting on 11/26/2016 for Leg Pain (Right side onset week more in calf and standing for prolonged period of times makes feet and toes numb)   HPI  Leg pain in back of right leg that started 1 week ago.  Pain starts as fatigue, then severe burning pain, followed by numbness of toes and foot.  The pain will go away completely if he sits for 10-15 minutes. Activity is limited, but pain can start only with standing for 10 minutes if he is outside standing to smoke a cigarette.  He is only able to walk 25 feet before onset of symptoms.  He denies swelling, redness, heat at the site of pain, pain at rest, shooting pain down leg, weakness, back pain, or sores on his legs.    Social History  Substance Use Topics  . Smoking status: Current Every Day Smoker    Packs/day: 0.50    Years: 40.00  . Smokeless tobacco: Never Used     Comment: refused by patient  . Alcohol use Yes     Comment: 4 drinks per week   3 ppd for 14 years.  In 2011 cut back and has now reduced to 1/2-3/4 ppd. He has some interest in quitting, but doesn't see it happening.  We discussed medical therapy options.  He has failed wellbutrin and Chantix in the past r/t depression and SI.  When he tries to use nicotine replacement, he admits he simply adds more nicotine to his current smoking habit.  Review of Systems  Respiratory: Negative for chest tightness and shortness of breath.   Cardiovascular: Negative for chest pain, palpitations and leg swelling.  Neurological: Negative for dizziness, facial asymmetry, speech difficulty, weakness, light-headedness and numbness.   Per HPI unless specifically indicated above     Objective:    BP 121/88   Pulse 88   Ht 6\' 2"  (1.88 m)   Wt 186 lb 9.6 oz (84.6 kg)   BMI 23.96 kg/m   Wt Readings from Last 3 Encounters:  11/26/16 186 lb 9.6 oz  (84.6 kg)  03/07/16 178 lb (80.7 kg)  12/23/15 178 lb 9.6 oz (81 kg)    Physical Exam  Constitutional: He is oriented to person, place, and time. He appears well-developed and well-nourished. No distress.  HENT:  Head: Normocephalic and atraumatic.  Neck: Normal range of motion. Neck supple. No JVD present.  Cardiovascular: Normal rate, regular rhythm, S1 normal and S2 normal.   Pulses:      Carotid pulses are 2+ on the right side, and 2+ on the left side.      Radial pulses are 1+ on the right side, and 1+ on the left side.       Femoral pulses are 2+ on the right side.      Dorsalis pedis pulses are 1+ on the right side, and 1+ on the left side.       Posterior tibial pulses are 2+ on the right side, and 2+ on the left side.  Abdominal: Soft. Normal appearance, normal aorta and bowel sounds are normal. He exhibits no abdominal bruit. There is no tenderness.  Musculoskeletal: Normal range of motion.  Neurological: He is alert and oriented to person, place, and time. He has normal reflexes.  Skin: Skin is warm, dry and intact. No  cyanosis. Nails show no clubbing.  Bilateral LE skin dry and flaky.  Psychiatric: He has a normal mood and affect. His behavior is normal. Judgment and thought content normal.        Assessment & Plan:   Problem List Items Addressed This Visit    None    Visit Diagnoses    Leg pain, posterior, right    -  Primary   Relevant Orders   VAS [place] ABI WITH/WO TBI   Ambulatory referral to Vascular Surgery   [place] Lower Ext Art Bilat 1. Pain described is consistent with intermittent claudication. 2. Vascular ankle-brachial index to evaluate blood flow to lower extremeties. 3. Bilateral lower extremity doppler study. 4. Referral to vascular surgery for further evaluation and work-up. 5. Return to clinic within 1 month for further evaluation if tests above are negative. 6. Discussion today >5 minutes (<10 minutes) specifically on counseling on risks of tobacco  use, complications, treatment, smoking cessation.          Follow up plan: Return in 1 month if symptoms worsen or fail to improve.   Cassell Smiles, DNP, AGNP-BC Adult Gerontology Nurse Practitioner [facility] 11/26/2016, 1:28 PM

## 2016-11-26 NOTE — Patient Instructions (Signed)
Bill, Thank you for coming in to clinic today.  1. Your pain in your leg is probably arterial in nature based on the way you describe it.  - ankle-brachial index measures blood flow differences between your arms and legs. This is the first step to finding out if you have enough blood going to your right leg and foot. - Ultrasound dopplers of your legs will show any blockages and blood flow through your lower legs. - Referral to Vascular surgery for any additional evaluation at [facility]  If you have chest pain, difficulty bleeding, or facial drooping, arm weakness, speech changes, then seek care at the Emergency Room via ambulance.  Please schedule a follow-up appointment with Cassell Smiles, AGNP in 1 months as needed.  If you have any other questions or concerns, please feel free to call the clinic or send a message through [place]. You may also schedule an earlier appointment if necessary.  Cassell Smiles, DNP, AGNP-BC Adult Gerontology Nurse Practitioner [facility], [facility]   Peripheral Vascular Disease Peripheral vascular disease (PVD) is a disease of the blood vessels that are not part of your heart and brain. A simple term for PVD is poor circulation. In most cases, PVD narrows the blood vessels that carry blood from your heart to the rest of your body. This can result in a decreased supply of blood to your arms, legs, and internal organs, like your stomach or kidneys. However, it most often affects a person's lower legs and feet. There are two types of PVD.  Organic PVD. This is the more common type. It is caused by damage to the structure of blood vessels.  Functional PVD. This is caused by conditions that make blood vessels contract and tighten (spasm). Without treatment, PVD tends to get worse over time. PVD can also lead to acute ischemic limb. This is when an arm or limb suddenly has trouble getting enough blood. This is a medical  emergency. What are the causes? Each type of PVD has many different causes. The most common cause of PVD is buildup of a fatty material (plaque) inside of your arteries (atherosclerosis). Small amounts of plaque can break off from the walls of the blood vessels and become lodged in a smaller artery. This blocks blood flow and can cause acute ischemic limb. Other common causes of PVD include:  Blood clots that form inside of blood vessels.  Injuries to blood vessels.  Diseases that cause inflammation of blood vessels or cause blood vessel spasms.  Health behaviors and health history that increase your risk of developing PVD. What increases the risk? You may have a greater risk of PVD if you:  Have a family history of PVD.  Have certain medical conditions, including:  High cholesterol.  Diabetes.  High blood pressure (hypertension).  Coronary heart disease.  Past problems with blood clots.  Past injury, such as burns or a broken bone. These may have damaged blood vessels in your limbs.  Buerger disease. This is caused by inflamed blood vessels in your hands and feet.  Some forms of arthritis.  Rare birth defects that affect the arteries in your legs.  Use tobacco.  Do not get enough exercise.  Are obese.  Are age 59 or older. What are the signs or symptoms? PVD may cause many different symptoms. Your symptoms depend on what part of your body is not getting enough blood. Some common signs and symptoms include:  Cramps in your lower legs.  This may be a symptom of poor leg circulation (claudication).  Pain and weakness in your legs while you are physically active that goes away when you rest (intermittent claudication).  Leg pain when at rest.  Leg numbness, tingling, or weakness.  Coldness in a leg or foot, especially when compared with the other leg.  Skin or hair changes. These can include:  Hair loss.  Shiny skin.  Pale or bluish skin.  Thick  toenails.  Inability to get or maintain an erection (erectile dysfunction). People with PVD are more prone to developing ulcers and sores on their toes, feet, or legs. These may take longer than normal to heal. How is this diagnosed? Your health care provider may diagnose PVD from your signs and symptoms. The health care provider will also do a physical exam. You may have tests to find out what is causing your PVD and determine its severity. Tests may include:  Blood pressure recordings from your arms and legs and measurements of the strength of your pulses (pulse volume recordings).  Imaging studies using sound waves to take pictures of the blood flow through your blood vessels (Doppler ultrasound).  Injecting a dye into your blood vessels before having imaging studies using:  X-rays (angiogram or arteriogram).  Computer-generated X-rays (CT angiogram).  A powerful electromagnetic field and a computer (magnetic resonance angiogram or MRA). How is this treated? Treatment for PVD depends on the cause of your condition and the severity of your symptoms. It also depends on your age. Underlying causes need to be treated and controlled. These include long-lasting (chronic) conditions, such as diabetes, high cholesterol, and high blood pressure. You may need to first try making lifestyle changes and taking medicines. Surgery may be needed if these do not work. Lifestyle changes may include:  Quitting smoking.  Exercising regularly.  Following a low-fat, low-cholesterol diet. Medicines may include:  Blood thinners to prevent blood clots.  Medicines to improve blood flow.  Medicines to improve your blood cholesterol levels. Surgical procedures may include:  A procedure that uses an inflated balloon to open a blocked artery and improve blood flow (angioplasty).  A procedure to put in a tube (stent) to keep a blocked artery open (stent implant).  Surgery to reroute blood flow around a  blocked artery (peripheral bypass surgery).  Surgery to remove dead tissue from an infected wound on the affected limb.  Amputation. This is surgical removal of the affected limb. This may be necessary in cases of acute ischemic limb that are not improved through medical or surgical treatments. Follow these instructions at home:  Take medicines only as directed by your health care provider.  Do not use any tobacco products, including cigarettes, chewing tobacco, or electronic cigarettes. If you need help quitting, ask your health care provider.  Lose weight if you are overweight, and maintain a healthy weight as directed by your health care provider.  Eat a diet that is low in fat and cholesterol. If you need help, ask your health care provider.  Exercise regularly. Ask your health care provider to suggest some good activities for you.  Use compression stockings or other mechanical devices as directed by your health care provider.  Take good care of your feet.  Wear comfortable shoes that fit well.  Check your feet often for any cuts or sores. Contact a health care provider if:  You have cramps in your legs while walking.  You have leg pain when you are at rest.  You have  coldness in a leg or foot.  Your skin changes.  You have erectile dysfunction.  You have cuts or sores on your feet that are not healing. Get help right away if:  Your arm or leg turns cold and blue.  Your arms or legs become red, warm, swollen, painful, or numb.  You have chest pain or trouble breathing.  You suddenly have weakness in your face, arm, or leg.  You become very confused or lose the ability to speak.  You suddenly have a very bad headache or lose your vision. This information is not intended to replace advice given to you by your health care provider. Make sure you discuss any questions you have with your health care provider. Document Released: 09/27/2004 Document Revised: 01/26/2016  Document Reviewed: 01/28/2014 Elsevier Interactive Patient Education  2017 [employer]  Intermittent Claudication Intermittent claudication is pain in your leg that occurs when you walk or exercise and goes away when you rest. The pain can occur in one or both legs. What are the causes? Intermittent claudication is caused by the buildup of plaque within the major arteries in the body (atherosclerosis). The plaque, which makes arteries stiff and narrow, prevents enough blood from reaching your leg muscles. The pain occurs when you walk or exercise because your muscles need more blood when you are moving and exercising. What increases the risk? Risk factors include:  A family history of atherosclerosis.  A personal history of stroke or heart disease.  Older age.  Being inactive or overweight.  Smoking cigarettes.  Having another health condition such as:  Diabetes.  High blood pressure.  High cholesterol. What are the signs or symptoms? Your hip or leg may:  Ache.  Cramp.  Feel tight.  Feel weak.  Feel heavy. Over time, you may feel pain in your calf, thigh, or hip. How is this diagnosed? Your health care provider may diagnose intermittent claudication based on your symptoms and medical history. Your health care provider may also do tests to learn more about your condition. These may include:  Blood tests.  An ultrasound.  Imaging tests such as angiography, magnetic resonance angiography (MRA), and computed tomography angiography (CTA). How is this treated? You may be treated for problems such as:  High blood pressure.  High cholesterol.  Diabetes. Other treatments may include:  Lifestyle changes such as:  Starting an exercise program.  Losing weight.  Quitting smoking.  Medicines to help restore blood flow through your legs.  Blood vessel surgery (angioplasty) to restore blood flow if your intermittent claudication is caused by severe peripheral  artery disease. Follow these instructions at home:  Manage any other health conditions you have.  Eat a diet low in saturated fats and calories to maintain a healthy weight.  Quit smoking, if you smoke.  Take medicines only as directed by your health care provider.  If your health care provider recommended an exercise program for you, follow it as directed. Your exercise program may involve:  Walking three or more times a week.  Walking until you have certain symptoms of intermittent claudication.  Resting until symptoms go away.  Gradually increasing walking time to about 50 minutes a day. Contact a health care provider if: Your condition is not getting better or is getting worse. Get help right away if:  You have chest pain.  You have difficulty breathing.  You develop arm weakness.  You have trouble speaking.  Your face begins to droop. This information is not intended to  replace advice given to you by your health care provider. Make sure you discuss any questions you have with your health care provider. Document Released: 06/22/2004 Document Revised: 01/26/2016 Document Reviewed: 11/26/2013 Elsevier Interactive Patient Education  2017 [employer].

## 2016-12-24 ENCOUNTER — Telehealth: Payer: Self-pay | Admitting: Nurse Practitioner

## 2016-12-31 ENCOUNTER — Ambulatory Visit (INDEPENDENT_AMBULATORY_CARE_PROVIDER_SITE_OTHER): Payer: Medicare Other | Admitting: Vascular Surgery

## 2016-12-31 ENCOUNTER — Other Ambulatory Visit: Payer: Self-pay | Admitting: Nurse Practitioner

## 2016-12-31 ENCOUNTER — Encounter (INDEPENDENT_AMBULATORY_CARE_PROVIDER_SITE_OTHER): Payer: Self-pay

## 2016-12-31 ENCOUNTER — Other Ambulatory Visit (INDEPENDENT_AMBULATORY_CARE_PROVIDER_SITE_OTHER): Payer: Medicare Other

## 2016-12-31 ENCOUNTER — Encounter (INDEPENDENT_AMBULATORY_CARE_PROVIDER_SITE_OTHER): Payer: Self-pay | Admitting: Vascular Surgery

## 2016-12-31 ENCOUNTER — Ambulatory Visit (INDEPENDENT_AMBULATORY_CARE_PROVIDER_SITE_OTHER): Payer: Medicare Other

## 2016-12-31 DIAGNOSIS — M79604 Pain in right leg: Secondary | ICD-10-CM

## 2016-12-31 DIAGNOSIS — I70211 Atherosclerosis of native arteries of extremities with intermittent claudication, right leg: Secondary | ICD-10-CM

## 2016-12-31 DIAGNOSIS — I70292 Other atherosclerosis of native arteries of extremities, left leg: Secondary | ICD-10-CM | POA: Insufficient documentation

## 2016-12-31 NOTE — Progress Notes (Signed)
MRN : [id number]  Ricardo WESSINGER Sr. is a 61 y.o. (August 11, 1956) male who presents with chief complaint of  Chief Complaint  Patient presents with  . New Patient (Initial Visit)  .  History of Present Illness:  The patient is seen for evaluation of painful lower extremities and diminished pulses. Patient notes the pain is always associated with activity and is very consistent day today. Typically, the pain occurs at less than one block, progress is as activity continues to the point that the patient must stop walking. Resting including standing still for several minutes allowed resumption of the activity and the ability to walk a similar distance before stopping again. Uneven terrain and inclined shorten the distance. The pain has been progressive over the past several years. The patient states the inability to walk is now having a profound negative impact on quality of life and daily activities.  The patient denies rest pain or dangling of an extremity off the side of the bed during the night for relief. No open wounds or sores at this time. No prior interventions or surgeries.  No history of back problems or DJD of the lumbar sacral spine.   The patient denies changes in claudication symptoms or new rest pain symptoms.  No new ulcers or wounds of the foot.  The patient's blood pressure has been stable and relatively well controlled. The patient denies amaurosis fugax or recent TIA symptoms. There are no recent neurological changes noted. The patient denies history of DVT, PE or superficial thrombophlebitis. The patient denies recent episodes of angina or shortness of breath.   Current Meds  Medication Sig  . sildenafil (REVATIO) 20 MG tablet Take 3-5 pills as needed prior to sex.    Past Medical History:  Diagnosis Date  . Arthritis    Rheumatoid  . Bipolar disorder ([place])   . Chronic kidney disease    kidney stone  . Depression   . Fusion of spine   . Hepatitis C   .  Hyperlipidemia     Past Surgical History:  Procedure Laterality Date  . BACK SURGERY  2004    Social History Social History  Substance Use Topics  . Smoking status: Current Every Day Smoker    Packs/day: 0.50    Years: 40.00  . Smokeless tobacco: Never Used     Comment: refused by patient  . Alcohol use Yes     Comment: 4 drinks per week    Family History Family History  Problem Relation Age of Onset  . Cancer Mother     liver  . Heart disease Father   . Diabetes Father   No family history of bleeding/clotting disorders, porphyria or autoimmune disease   No Known Allergies   REVIEW OF SYSTEMS (Negative unless checked)  Constitutional: [] Weight loss  [] Fever  [] Chills Cardiac: [] Chest pain   [] Chest pressure   [] Palpitations   [] Shortness of breath when laying flat   [] Shortness of breath with exertion. Vascular:  [x] Pain in legs with walking   [x] Pain in legs at rest  [] History of DVT   [] Phlebitis   [] Swelling in legs   [] Varicose veins   [] Non-healing ulcers Pulmonary:   [] Uses home oxygen   [] Productive cough   [] Hemoptysis   [] Wheeze  [] COPD   [] Asthma Neurologic:  [] Dizziness   [] Seizures   [] History of stroke   [] History of TIA  [] Aphasia   [] Vissual changes   [] Weakness or numbness in arm   [] Weakness or  numbness in leg Musculoskeletal:   [] Joint swelling   [] Joint pain   [] Low back pain Hematologic:  [] Easy bruising  [] Easy bleeding   [] Hypercoagulable state   [] Anemic Gastrointestinal:  [] Diarrhea   [] Vomiting  [] Gastroesophageal reflux/heartburn   [] Difficulty swallowing. Genitourinary:  [] Chronic kidney disease   [] Difficult urination  [] Frequent urination   [] Blood in urine Skin:  [] Rashes   [] Ulcers  Psychological:  [] History of anxiety   []  History of major depression.  Physical Examination  Vitals:   12/31/16 1419  BP: 130/89  Pulse: 86  Resp: 17  Weight: 182 lb (82.6 kg)  Height: 6\' 2"  (1.88 m)   Body mass index is 23.37 kg/m. Gen: WD/WN,  NAD Head: [place]/AT, No temporalis wasting.  Ear/Nose/Throat: Hearing grossly intact, nares w/o erythema or drainage, poor dentition Eyes: PER, EOMI, sclera nonicteric.  Neck: Supple, no masses.  No bruit or JVD.  Pulmonary:  Good air movement, clear to auscultation bilaterally, no use of accessory muscles.  Cardiac: RRR, normal S1, S2, no Murmurs. Vascular: right foot pale  Cool to touch withpoor cap refil Vessel Right Left  Radial Palpable Palpable  Ulnar Palpable Palpable  Brachial Palpable Palpable  Carotid Palpable Palpable  Femoral Palpable Palpable  Popliteal Not Palpable Palpable  PT Not Palpable Palpable  DP Not Palpable Palpable   Gastrointestinal: soft, non-distended. No guarding/no peritoneal signs.  Musculoskeletal: M/S 5/5 throughout.  No deformity or atrophy.  Neurologic: CN 2-12 intact. Pain and light touch intact in extremities.  Symmetrical.  Speech is fluent. Motor exam as listed above. Psychiatric: Judgment intact, Mood & affect appropriate for pt's clinical situation. Dermatologic: No rashes or ulcers noted.  No changes consistent with cellulitis. Lymph : No Cervical lymphadenopathy, no lichenification or skin changes of chronic lymphedema.  CBC No results found for: WBC, HGB, HCT, MCV, PLT  BMET    Component Value Date/Time   NA 139 12/23/2015 1606   K 4.1 12/23/2015 1606   CL 96 12/23/2015 1606   CO2 24 12/23/2015 1606   GLUCOSE 98 12/23/2015 1606   BUN 15 12/23/2015 1606   CREATININE 1.10 12/23/2015 1606   CALCIUM 9.5 12/23/2015 1606   GFRNONAA 74 12/23/2015 1606   GFRAA 85 12/23/2015 1606   CrCl cannot be calculated (Patient's most recent lab result is older than the maximum 21 days allowed.).  COAG No results found for: INR, PROTIME  Radiology No results found.  Assessment/Plan 1. Atherosclerosis of native artery of right lower extremity with intermittent claudication (HCC) Recommend:  The patient has evidence of severe atherosclerotic  changes of both lower extremities with rest pain that is associated with preulcerative changes and impending tissue loss of the foot.  This represents a limb threatening ischemia and places the patient at the risk for limb loss.  Patient should undergo angiography of the lower extremities with the hope for intervention for limb salvage.  The risks and benefits as well as the alternative therapies was discussed in detail with the patient.  All questions were answered.  Patient agrees to proceed with angiography.  The patient will follow up with me in the office after the procedure.    A total of 70 minutes was spent with this patient and greater than 50% was spent in counseling and coordination of care with the patient.  Discussion included the treatment options for vascular disease including indications for surgery and intervention.  Also discussed is the appropriate timing of treatment.  In addition medical therapy was discussed.  2. Pain of right lower extremity See #1    [name], MD  12/31/2016 8:56 PM

## 2017-01-01 ENCOUNTER — Other Ambulatory Visit (INDEPENDENT_AMBULATORY_CARE_PROVIDER_SITE_OTHER): Payer: Self-pay | Admitting: Vascular Surgery

## 2017-01-03 ENCOUNTER — Encounter
Admission: RE | Admit: 2017-01-03 | Discharge: 2017-01-03 | Disposition: A | Discharge status: Home / Self Care | Payer: Medicare Other | Source: Ambulatory Visit | Attending: Vascular Surgery | Admitting: Vascular Surgery

## 2017-01-03 DIAGNOSIS — B182 Chronic viral hepatitis C: Secondary | ICD-10-CM | POA: Diagnosis not present

## 2017-01-03 DIAGNOSIS — F329 Major depressive disorder, single episode, unspecified: Secondary | ICD-10-CM | POA: Insufficient documentation

## 2017-01-03 DIAGNOSIS — I70211 Atherosclerosis of native arteries of extremities with intermittent claudication, right leg: Secondary | ICD-10-CM | POA: Insufficient documentation

## 2017-01-03 DIAGNOSIS — M79604 Pain in right leg: Secondary | ICD-10-CM | POA: Diagnosis not present

## 2017-01-03 DIAGNOSIS — Z8249 Family history of ischemic heart disease and other diseases of the circulatory system: Secondary | ICD-10-CM | POA: Diagnosis not present

## 2017-01-03 DIAGNOSIS — F17210 Nicotine dependence, cigarettes, uncomplicated: Secondary | ICD-10-CM | POA: Insufficient documentation

## 2017-01-03 DIAGNOSIS — Z01812 Encounter for preprocedural laboratory examination: Secondary | ICD-10-CM | POA: Diagnosis not present

## 2017-01-03 DIAGNOSIS — N289 Disorder of kidney and ureter, unspecified: Secondary | ICD-10-CM | POA: Diagnosis not present

## 2017-01-03 DIAGNOSIS — Z833 Family history of diabetes mellitus: Secondary | ICD-10-CM | POA: Diagnosis not present

## 2017-01-03 DIAGNOSIS — E785 Hyperlipidemia, unspecified: Secondary | ICD-10-CM | POA: Insufficient documentation

## 2017-01-03 DIAGNOSIS — Z808 Family history of malignant neoplasm of other organs or systems: Secondary | ICD-10-CM | POA: Diagnosis not present

## 2017-01-03 DIAGNOSIS — Z9889 Other specified postprocedural states: Secondary | ICD-10-CM | POA: Diagnosis not present

## 2017-01-03 HISTORY — DX: Disease of pericardium, unspecified: I31.9

## 2017-01-03 HISTORY — DX: Peripheral vascular disease, unspecified: I73.9

## 2017-01-03 HISTORY — DX: Personal history of urinary calculi: Z87.442

## 2017-01-03 LAB — CREATININE, SERUM
Creatinine, Ser: 1.1 mg/dL (ref 0.61–1.24)
GFR calc non Af Amer: >60 mL/min (ref >60)

## 2017-01-03 LAB — BUN: BUN: 28 mg/dL — AB (ref 6–20)

## 2017-01-04 ENCOUNTER — Encounter: Admission: RE | Payer: Self-pay | Source: Ambulatory Visit

## 2017-01-04 ENCOUNTER — Ambulatory Visit: Admission: RE | Admit: 2017-01-04 | Payer: Medicare Other | Source: Ambulatory Visit | Admitting: Vascular Surgery

## 2017-01-04 SURGERY — LOWER EXTREMITY ANGIOGRAPHY
Anesthesia: Moderate Sedation | Laterality: Right

## 2017-01-04 MED ORDER — CEFAZOLIN SODIUM-DEXTROSE 1-4 GM/50ML-% IV SOLN: 1.0000 g | Freq: Once | INTRAVENOUS | Status: DC

## 2017-01-07 ENCOUNTER — Encounter (INDEPENDENT_AMBULATORY_CARE_PROVIDER_SITE_OTHER): Payer: Self-pay

## 2017-01-07 ENCOUNTER — Ambulatory Visit (INDEPENDENT_AMBULATORY_CARE_PROVIDER_SITE_OTHER): Payer: Medicare Other | Admitting: Nurse Practitioner

## 2017-01-07 ENCOUNTER — Encounter: Payer: Self-pay | Admitting: Nurse Practitioner

## 2017-01-07 VITALS — BP 129/84 | HR 94 | Temp 98.0°F | Resp 16 | Ht 74.0 in | Wt 185.0 lb

## 2017-01-07 DIAGNOSIS — N5089 Other specified disorders of the male genital organs: Secondary | ICD-10-CM

## 2017-01-07 NOTE — Progress Notes (Signed)
I have reviewed this encounter including the documentation in this note and/or discussed this patient with the provider, Cassell Smiles, AGPCNP-BC. I am certifying that I agree with the content of this note as supervising physician.  Nobie Putnam, [facility] 01/07/2017, 11:53 PM

## 2017-01-07 NOTE — Patient Instructions (Addendum)
Ricardo Cervantes, Thank you for coming in to clinic today.  1. You likely have epididymitis.  We are recommending an urgent referral to urology.   Please schedule a follow-up appointment with Cassell Smiles, AGNP in 3-5 days if symptoms worsen before urology appointment.  If you have any other questions or concerns, please feel free to call the clinic or send a message through [facility]. You may also schedule an earlier appointment if necessary.  Cassell Smiles, DNP, AGNP-BC Adult Gerontology Nurse Practitioner [place]   Epididymitis Epididymitis is swelling (inflammation) of the epididymis. The epididymis is a cord-like structure that is located along the top and back part of the testicle. It collects and stores sperm from the testicle. This condition can also cause pain and swelling of the testicle and scrotum. Symptoms usually start suddenly (acute epididymitis). Sometimes epididymitis starts gradually and lasts for a while (chronic epididymitis). This type may be harder to treat. What are the causes? In men 38 and younger, this condition is usually caused by a bacterial infection or sexually transmitted disease (STD), such as:  Gonorrhea.  Chlamydia. In men 6 and older who do not have anal sex, this condition is usually caused by bacteria from a blockage or abnormalities in the urinary system. These can result from:  Having a tube placed into the bladder (urinary catheter).  Having an enlarged or inflamed prostate gland.  Having recent urinary tract surgery. In men who have a condition that weakens the body's defense system (immune system), such as HIV, this condition can be caused by:  Other bacteria, including tuberculosis and syphilis.  Viruses.  Fungi. Sometimes this condition occurs without infection. That may happen if urine flows backward into the epididymis after heavy lifting or straining. What increases the risk? This condition is more likely to  develop in men:  Who have unprotected sex with more than one partner.  Who have anal sex.  Who have recently had surgery.  Who have a urinary catheter.  Who have urinary problems.  Who have a suppressed immune system. What are the signs or symptoms? This condition usually begins suddenly with chills, fever, and pain behind the scrotum and in the testicle. Other symptoms include:  Swelling of the scrotum, testicle, or both.  Pain whenejaculatingor urinating.  Pain in the back or belly.  Nausea.  Itching and discharge from the penis.  Frequent need to pass urine.  Redness and tenderness of the scrotum. How is this diagnosed? Your health care provider can diagnose this condition based on your symptoms and medical history. Your health care provider will also do a physical exam to ask about your symptoms and check your scrotum and testicle for swelling, pain, and redness. You may also have other tests, including:  Examination of discharge from the penis.  Urine tests for infections, such as STDs. Your health care provider may test you for other STDs, including HIV. How is this treated? Treatment for this condition depends on the cause. If your condition is caused by a bacterial infection, oral antibiotic medicine may be prescribed. If the bacterial infection has spread to your blood, you may need to receive IV antibiotics. Nonbacterial epididymitis is treated with home care that includes bed rest and elevation of the scrotum. Surgery may be needed to treat:  Bacterial epididymitis that causes pus to build up in the scrotum (abscess).  Chronic epididymitis that has not responded to other treatments. Follow these instructions at home: Medicines   Take over-the-counter and prescription  medicines only as told by your health care provider.  If you were prescribed an antibiotic medicine, take it as told by your health care provider. Do not stop taking the antibiotic even if  your condition improves. Sexual Activity   If your epididymitis was caused by an STD, avoid sexual activity until your treatment is complete.  Inform your sexual partner or partners if you test positive for an STD. They may need to be treated.Do not engage in sexual activity with your partner or partners until their treatment is completed. General instructions   Return to your normal activities as told by your health care provider. Ask your health care provider what activities are safe for you.  Keep your scrotum elevated and supported while resting. Ask your health care provider if you should wear a scrotal support, such as a jockstrap. Wear it as told by your health care provider.  If directed, apply ice to the affected area:  Put ice in a plastic bag.  Place a towel between your skin and the bag.  Leave the ice on for 20 minutes, 2-3 times per day.  Try taking a sitz bath to help with discomfort. This is a warm water bath that is taken while you are sitting down. The water should only come up to your hips and should cover your buttocks. Do this 3-4 times per day or as told by your health care provider.  Keep all follow-up visits as told by your health care provider. This is important. Contact a health care provider if:  You have a fever.  Your pain medicine is not helping.  Your pain is getting worse.  Your symptoms do not improve within three days. This information is not intended to replace advice given to you by your health care provider. Make sure you discuss any questions you have with your health care provider. Document Released: 08/17/2000 Document Revised: 01/26/2016 Document Reviewed: 01/05/2015 Elsevier Interactive Patient Education  2017 [employer].

## 2017-01-07 NOTE — Progress Notes (Signed)
Subjective:    Patient ID: Ricardo Folks Sr., male    DOB: [birth year], 61 y.o.   MRN: [id number]  Ricardo ZOBRIST Sr. is a 61 y.o. male presenting on 01/07/2017 for Testicle Pain (Patient here today C/O testicular pain since Wednesday, denies any injuries, blood in urine or any abnormal discharge. )   HPI Testicular pain  R testicle is larger - is swollen or has a growth on distal, posterolateral aspect of testicle.  Noticed first on Wednesday with sudden onset of 9/10 pain that persisted at that intensity through Friday.  Yesterday morning pain was better and now is described as a dull ache 2/10.  He has not had sexual intercourse in the last several months.  He reports being checked for STDs last and having none.  He has only had one sexual contact since that check and has not noticed any drainage or sores since then.  He does masturbate daily.  He has had no pain with ejaculation and no change in erectile function or ejaculate character with blood or other color/consistency.  History of prostate enlargement. Denies urological symptoms of blood in urine, urinary frequency or urgency.  He notes no changes except pain and swelling.  Denies fever, chills, sweats, nausea, vomiting, diarrhea.   Brief follow-up for intermittent claudication: - Confirmed femoral artery blockage.  Scheduled for angiography and possible intervention last Friday and had it cancelled by [facility].  Not yet rescheduled.  Social History  Substance Use Topics  . Smoking status: Current Every Day Smoker    Packs/day: 0.50    Years: 40.00  . Smokeless tobacco: Never Used     Comment: refused by patient  . Alcohol use Yes     Comment: 4 drinks per week    Review of Systems Per HPI unless specifically indicated above     Objective:    BP 129/84 (BP Location: Left Arm, Patient Position: Sitting, Cuff Size: Large)   Pulse 94   Temp 98 F (36.7 C)   Resp 16   Ht 6\' 2"  (1.88 m)   Wt 185 lb (83.9 kg)    SpO2 96%   BMI 23.75 kg/m   Wt Readings from Last 3 Encounters:  01/07/17 185 lb (83.9 kg)  12/31/16 182 lb (82.6 kg)  11/26/16 186 lb 9.6 oz (84.6 kg)    Physical Exam  Abdominal: Hernia confirmed negative in the right inguinal area and confirmed negative in the left inguinal area.  Genitourinary: Penis normal.    Right testis shows no mass, no swelling and no tenderness. Right testis is descended. Cremasteric reflex is not absent on the right side. Left testis shows no mass, no swelling and no tenderness. Left testis is descended. Cremasteric reflex is not absent on the left side.  Lymphadenopathy:       Right: No inguinal adenopathy present.       Left: No inguinal adenopathy present.   Results for orders placed or performed during the hospital encounter of 01/03/17  BUN  Result Value Ref Range   BUN 28 (H) 6 - 20 mg/dL  Creatinine, serum  Result Value Ref Range   Creatinine, Ser 1.10 0.61 - 1.24 mg/dL   GFR calc non Af Amer >60 >60 mL/min   GFR calc Af Amer >60 >60 mL/min      Assessment & Plan:   Problem List Items Addressed This Visit    None    Visit Diagnoses    Swollen testicle    -  Primary Acute pain onset with swollen testicle.  Possible epididymitis vs hydrocele.  No changes in sexual function or ejaculate.  Plan: 1. Defer antibiotics r/t resolving nature of pain.  Will treat empirically if worsening symptoms. 2. Urgent referral placed to urology.  Pt given instructions to call clinic with worsening pain, fever, chills, sweats in next 3-5 days if before urology appointment.   Relevant Orders   Ambulatory referral to Urology       Follow up plan: Return 3-5 days if symptoms worsen before urology appointment.   Cassell Smiles, DNP, AGPCNP-BC Adult Gerontology Primary Care Nurse Practitioner [facility] 01/07/2017, 9:04 AM

## 2017-01-08 ENCOUNTER — Ambulatory Visit (INDEPENDENT_AMBULATORY_CARE_PROVIDER_SITE_OTHER): Payer: Medicare Other | Admitting: Urology

## 2017-01-08 ENCOUNTER — Encounter: Payer: Self-pay | Admitting: Urology

## 2017-01-08 VITALS — BP 118/80 | HR 82 | Ht 74.0 in | Wt 185.5 lb

## 2017-01-08 DIAGNOSIS — N5089 Other specified disorders of the male genital organs: Secondary | ICD-10-CM | POA: Diagnosis not present

## 2017-01-08 DIAGNOSIS — N453 Epididymo-orchitis: Secondary | ICD-10-CM

## 2017-01-08 DIAGNOSIS — I70211 Atherosclerosis of native arteries of extremities with intermittent claudication, right leg: Secondary | ICD-10-CM

## 2017-01-08 NOTE — Progress Notes (Signed)
01/08/2017 11:17 AM   Ricardo Folks Sr. 1955-12-27 [id number]  Referring provider: Mikey College, NP [place], [place] [address]  Chief Complaint  Patient presents with  . Groin Swelling    HPI: 61 year old male presents today for left-sided testicular pain. The patient states this pain began sometime about 6 days prior. Over the course of the next couple days the pain got significantly worse. The patient denies any associated symptoms including fever, chills, or dysuria. He has not had any hematuria. The patient does not have a history of urinary tract infections. He was seen by a primary care provider on Monday per (yesterday) and noted to have a small  abnormal area "posterior lateral" on his left testicle.  The patient has no significant past medical history, he does take significant amounts of ibuprofen for his neck. He has been taking up to 800 mg 3 times a day.   PMH: Past Medical History:  Diagnosis Date  . Arthritis    Rheumatoid  . Bipolar disorder ([place])   . Chronic kidney disease    kidney stone  . Depression   . Fusion of spine   . Hepatitis C   . History of kidney stones   . Hyperlipidemia   . Pericarditis    20 years ago  . Peripheral vascular disease [facility])     Surgical History: Past Surgical History:  Procedure Laterality Date  . BACK SURGERY  2004   acdf    Home Medications:  Allergies as of 01/08/2017   No Known Allergies     Medication List       Accurate as of 01/08/17 11:17 AM. Always use your most recent med list.          ADVIL 200 MG Caps Generic drug:  Ibuprofen Take 800 mg by mouth every 8 (eight) hours as needed (for pain.).   sildenafil 20 MG tablet Commonly known as:  REVATIO Take 3-5 pills as needed prior to sex.       Allergies: No Known Allergies  Family History: Family History  Problem Relation Age of Onset  . Cancer Mother     liver  . Heart disease Father   . Diabetes Father   . Prostate  cancer Neg Hx   . Bladder Cancer Neg Hx   . Kidney cancer Neg Hx     Social History:  reports that he has been smoking.  He has a 20.00 pack-year smoking history. He has never used smokeless tobacco. He reports that he drinks alcohol. He reports that he does not use drugs.  ROS: UROLOGY Frequent Urination?: No Hard to postpone urination?: Yes Burning/pain with urination?: No Get up at night to urinate?: Yes Leakage of urine?: Yes Urine stream starts and stops?: No Trouble starting stream?: No Do you have to strain to urinate?: No Blood in urine?: No Urinary tract infection?: No Sexually transmitted disease?: No Injury to kidneys or bladder?: No Painful intercourse?: No Weak stream?: No Erection problems?: Yes Penile pain?: No  Gastrointestinal Nausea?: No Vomiting?: No Indigestion/heartburn?: No Diarrhea?: No Constipation?: No  Constitutional Fever: No Night sweats?: No Weight loss?: No Fatigue?: No  Skin Skin rash/lesions?: No Itching?: No  Eyes Blurred vision?: No Double vision?: No  Ears/Nose/Throat Sore throat?: No Sinus problems?: No  Hematologic/Lymphatic Swollen glands?: No Easy bruising?: No  Cardiovascular Leg swelling?: No Chest pain?: No  Respiratory Cough?: No Shortness of breath?: No  Endocrine Excessive thirst?: No  Musculoskeletal Back pain?:  Yes Joint pain?: No  Neurological Headaches?: Yes Dizziness?: No  Psychologic Depression?: No Anxiety?: No  Physical Exam: BP 118/80 (BP Location: Left Arm, Patient Position: Sitting, Cuff Size: Normal)   Pulse 82   Ht 6\' 2"  (1.88 m)   Wt 84.1 kg (185 lb 8 oz)   BMI 23.82 kg/m   Constitutional:  Alert and oriented, No acute distress. HEENT: [place] AT, moist mucus membranes.  Trachea midline, no masses. Cardiovascular: No clubbing, cyanosis, or edema. Respiratory: Normal respiratory effort, no increased work of breathing. GI: Abdomen is soft, nontender, nondistended, no abdominal  masses GU: No CVA tenderness.  The patient has a thickened and tender left epididymis with a small up minimal cyst at the tail of the epididymis. He has no left-sided testicular pain. He has no abnormalities of his right testicle or epididymis. Skin: No rashes, bruises or suspicious lesions. Lymph: No cervical or inguinal adenopathy. Neurologic: Grossly intact, no focal deficits, moving all 4 extremities. Psychiatric: Normal mood and affect.  Laboratory Data: No results found for: WBC, HGB, HCT, MCV, PLT  Lab Results  Component Value Date   CREATININE 1.10 01/03/2017    No results found for: PSA  No results found for: TESTOSTERONE  No results found for: HGBA1C  Urinalysis No results found for: COLORURINE, APPEARANCEUR, LABSPEC, PHURINE, GLUCOSEU, HGBUR, BILIRUBINUR, KETONESUR, PROTEINUR, UROBILINOGEN, NITRITE, LEUKOCYTESUR  Pertinent Imaging: None  Assessment & Plan:  The patient has left-sided epididymitis. I don't think that this is bacterial in origin. It is most likely reflux given his severe obstructive voiding symptoms.  I discussed the treatment for this would be nonsteroidal anti-inflammatories which he is already taking, and likely has had a significant impact on her symptoms as they are improving quite quickly, as well as ice and tight fitting underpants. We also discussed the utility of tamsulosin which the patient is currently declined. However, I did reassure him that this was nothing more significant. I suspect that this will improve if he follows the above instructions. He needs no additional follow-up for this.  1. Testicle swelling  - Urinalysis, Complete  2. Orchitis and epididymitis    Return if symptoms worsen or fail to improve.  Ardis Hughs, [facility] [address] [place], [place] [address] [phone]

## 2017-01-09 LAB — URINALYSIS, COMPLETE
Bilirubin, UA: NEGATIVE
Glucose, UA: NEGATIVE
Ketones, UA: NEGATIVE
LEUKOCYTES UA: NEGATIVE
NITRITE UA: NEGATIVE
PH UA: 6 (ref 5.0–7.5)
Protein, UA: NEGATIVE
RBC UA: NEGATIVE
Specific Gravity, UA: <1.005 — ABNORMAL LOW (ref 1.005–1.030)
Urobilinogen, Ur: 0.2 mg/dL (ref 0.2–1.0)

## 2017-01-10 ENCOUNTER — Other Ambulatory Visit (INDEPENDENT_AMBULATORY_CARE_PROVIDER_SITE_OTHER): Payer: Self-pay | Admitting: Vascular Surgery

## 2017-01-10 NOTE — Telephone Encounter (Signed)
Error

## 2017-01-14 ENCOUNTER — Inpatient Hospital Stay: Admission: RE | Admit: 2017-01-14 | Payer: Medicare Other | Source: Ambulatory Visit

## 2017-01-15 ENCOUNTER — Ambulatory Visit
Admission: RE | Admit: 2017-01-15 | Discharge: 2017-01-15 | Disposition: A | Discharge status: Home / Self Care | Payer: Medicare Other | Source: Ambulatory Visit | Attending: Vascular Surgery | Admitting: Vascular Surgery

## 2017-01-15 ENCOUNTER — Encounter
Admission: RE | Disposition: A | Discharge status: Home / Self Care | Payer: Self-pay | Source: Ambulatory Visit | Attending: Vascular Surgery

## 2017-01-15 DIAGNOSIS — Z800 Family history of malignant neoplasm of digestive organs: Secondary | ICD-10-CM | POA: Diagnosis not present

## 2017-01-15 DIAGNOSIS — I70221 Atherosclerosis of native arteries of extremities with rest pain, right leg: Secondary | ICD-10-CM | POA: Insufficient documentation

## 2017-01-15 DIAGNOSIS — Z8619 Personal history of other infectious and parasitic diseases: Secondary | ICD-10-CM | POA: Diagnosis not present

## 2017-01-15 DIAGNOSIS — Z8249 Family history of ischemic heart disease and other diseases of the circulatory system: Secondary | ICD-10-CM | POA: Diagnosis not present

## 2017-01-15 DIAGNOSIS — Z9889 Other specified postprocedural states: Secondary | ICD-10-CM | POA: Insufficient documentation

## 2017-01-15 DIAGNOSIS — Z833 Family history of diabetes mellitus: Secondary | ICD-10-CM | POA: Diagnosis not present

## 2017-01-15 DIAGNOSIS — E785 Hyperlipidemia, unspecified: Secondary | ICD-10-CM | POA: Diagnosis not present

## 2017-01-15 DIAGNOSIS — F17210 Nicotine dependence, cigarettes, uncomplicated: Secondary | ICD-10-CM | POA: Insufficient documentation

## 2017-01-15 DIAGNOSIS — N189 Chronic kidney disease, unspecified: Secondary | ICD-10-CM | POA: Diagnosis not present

## 2017-01-15 DIAGNOSIS — Z87442 Personal history of urinary calculi: Secondary | ICD-10-CM | POA: Insufficient documentation

## 2017-01-15 DIAGNOSIS — M069 Rheumatoid arthritis, unspecified: Secondary | ICD-10-CM | POA: Diagnosis not present

## 2017-01-15 HISTORY — PX: ABDOMINAL AORTOGRAM W/LOWER EXTREMITY: CATH118223

## 2017-01-15 HISTORY — PX: LOWER EXTREMITY ANGIOGRAPHY: CATH118251

## 2017-01-15 SURGERY — LOWER EXTREMITY ANGIOGRAPHY
Anesthesia: Moderate Sedation | Laterality: Right

## 2017-01-15 MED ORDER — ONDANSETRON HCL 4 MG/2ML IJ SOLN: 4.0000 mg | Freq: Four times a day (QID) | INTRAMUSCULAR | Status: DC | PRN

## 2017-01-15 MED ORDER — MIDAZOLAM HCL 5 MG/5ML IJ SOLN
INTRAMUSCULAR | Status: AC
  Filled 2017-01-15: qty 5

## 2017-01-15 MED ORDER — HYDROMORPHONE HCL 1 MG/ML IJ SOLN: 0.5000 mg | INTRAMUSCULAR | Status: DC | PRN

## 2017-01-15 MED ORDER — FENTANYL CITRATE (PF) 100 MCG/2ML IJ SOLN
INTRAMUSCULAR | Status: AC
  Filled 2017-01-15: qty 4

## 2017-01-15 MED ORDER — METOPROLOL TARTRATE 5 MG/5ML IV SOLN: 2.0000 mg | INTRAVENOUS | Status: DC | PRN

## 2017-01-15 MED ORDER — PHENOL 1.4 % MT LIQD: 1.0000 | OROMUCOSAL | Status: DC | PRN

## 2017-01-15 MED ORDER — SODIUM CHLORIDE 0.9 % IV SOLN: 500.0000 mL | Freq: Once | INTRAVENOUS | Status: DC | PRN

## 2017-01-15 MED ORDER — MIDAZOLAM HCL 2 MG/2ML IJ SOLN
INTRAMUSCULAR | Status: DC | PRN
  Administered 2017-01-15 (×3): 1 mg via INTRAVENOUS
  Administered 2017-01-15 (×2): 2 mg via INTRAVENOUS
  Administered 2017-01-15 (×2): 1 mg via INTRAVENOUS

## 2017-01-15 MED ORDER — FAMOTIDINE 20 MG PO TABS: 40.0000 mg | ORAL_TABLET | ORAL | Status: DC | PRN

## 2017-01-15 MED ORDER — HEPARIN SODIUM (PORCINE) 1000 UNIT/ML IJ SOLN
INTRAMUSCULAR | Status: AC
  Filled 2017-01-15: qty 1

## 2017-01-15 MED ORDER — SODIUM CHLORIDE 0.9 % IV SOLN
INTRAVENOUS | Status: AC | PRN
  Administered 2017-01-15: 500 mL via INTRAVENOUS

## 2017-01-15 MED ORDER — LIDOCAINE HCL (PF) 1 % IJ SOLN
INTRAMUSCULAR | Status: AC
  Filled 2017-01-15: qty 30

## 2017-01-15 MED ORDER — SODIUM CHLORIDE 0.9 % IV SOLN: 1.0000 mL/kg/h | INTRAVENOUS | Status: DC

## 2017-01-15 MED ORDER — FENTANYL CITRATE (PF) 100 MCG/2ML IJ SOLN
INTRAMUSCULAR | Status: DC | PRN
  Administered 2017-01-15: 25 ug via INTRAVENOUS
  Administered 2017-01-15 (×2): 50 ug via INTRAVENOUS
  Administered 2017-01-15: 25 ug via INTRAVENOUS
  Administered 2017-01-15 (×3): 50 ug via INTRAVENOUS

## 2017-01-15 MED ORDER — CEFAZOLIN SODIUM-DEXTROSE 1-4 GM/50ML-% IV SOLN
1.0000 g | Freq: Once | INTRAVENOUS | Status: AC
Start: 2017-01-15 — End: 2017-01-15
  Administered 2017-01-15: 1 g via INTRAVENOUS

## 2017-01-15 MED ORDER — CLOPIDOGREL BISULFATE 75 MG PO TABS
ORAL_TABLET | ORAL | Status: AC
  Filled 2017-01-15: qty 4

## 2017-01-15 MED ORDER — OXYCODONE HCL 5 MG PO TABS: 5.0000 mg | ORAL_TABLET | ORAL | Status: DC | PRN

## 2017-01-15 MED ORDER — LABETALOL HCL 5 MG/ML IV SOLN: 10.0000 mg | INTRAVENOUS | Status: DC | PRN

## 2017-01-15 MED ORDER — CLOPIDOGREL BISULFATE 75 MG PO TABS: 75.0000 mg | ORAL_TABLET | Freq: Every day | ORAL | 11 refills | Status: DC

## 2017-01-15 MED ORDER — HEPARIN SODIUM (PORCINE) 1000 UNIT/ML IJ SOLN
INTRAMUSCULAR | Status: DC | PRN
  Administered 2017-01-15: 5000 [IU] via INTRAVENOUS

## 2017-01-15 MED ORDER — FENTANYL CITRATE (PF) 100 MCG/2ML IJ SOLN
INTRAMUSCULAR | Status: AC
  Filled 2017-01-15: qty 2

## 2017-01-15 MED ORDER — NITROGLYCERIN 1 MG/10 ML FOR IR/CATH LAB
INTRA_ARTERIAL | Status: DC | PRN
  Administered 2017-01-15: 250 ug via INTRA_ARTERIAL
  Administered 2017-01-15: 750 ug via INTRA_ARTERIAL

## 2017-01-15 MED ORDER — DIPHENHYDRAMINE HCL 50 MG/ML IJ SOLN
INTRAMUSCULAR | Status: DC | PRN
  Administered 2017-01-15 (×2): 25 mg via INTRAVENOUS

## 2017-01-15 MED ORDER — GUAIFENESIN-DM 100-10 MG/5ML PO SYRP: 15.0000 mL | ORAL_SOLUTION | ORAL | Status: DC | PRN

## 2017-01-15 MED ORDER — HEPARIN (PORCINE) IN NACL 2-0.9 UNIT/ML-% IJ SOLN
INTRAMUSCULAR | Status: AC
  Filled 2017-01-15: qty 1000

## 2017-01-15 MED ORDER — ACETAMINOPHEN 325 MG PO TABS: 325.0000 mg | ORAL_TABLET | ORAL | Status: DC | PRN

## 2017-01-15 MED ORDER — SODIUM CHLORIDE 0.9 % IV SOLN
INTRAVENOUS | Status: DC
  Administered 2017-01-15: 13:00:00 via INTRAVENOUS

## 2017-01-15 MED ORDER — HYDRALAZINE HCL 20 MG/ML IJ SOLN: 5.0000 mg | INTRAMUSCULAR | Status: DC | PRN

## 2017-01-15 MED ORDER — SODIUM CHLORIDE 0.9 % IJ SOLN
INTRAMUSCULAR | Status: AC
  Filled 2017-01-15: qty 50

## 2017-01-15 MED ORDER — CLOPIDOGREL BISULFATE 300 MG PO TABS
300.0000 mg | ORAL_TABLET | ORAL | Status: AC
  Administered 2017-01-15: 300 mg via ORAL

## 2017-01-15 MED ORDER — METHYLPREDNISOLONE SODIUM SUCC 125 MG IJ SOLR: 125.0000 mg | INTRAMUSCULAR | Status: DC | PRN

## 2017-01-15 MED ORDER — ALUM & MAG HYDROXIDE-SIMETH 200-200-20 MG/5ML PO SUSP: 15.0000 mL | ORAL | Status: DC | PRN

## 2017-01-15 MED ORDER — ACETAMINOPHEN 325 MG RE SUPP: 325.0000 mg | RECTAL | Status: DC | PRN

## 2017-01-15 MED ORDER — DIPHENHYDRAMINE HCL 50 MG/ML IJ SOLN
INTRAMUSCULAR | Status: AC
  Filled 2017-01-15: qty 1

## 2017-01-15 MED ORDER — NITROGLYCERIN 5 MG/ML IV SOLN
INTRAVENOUS | Status: AC
  Filled 2017-01-15: qty 10

## 2017-01-15 SURGICAL SUPPLY — 37 items
BALLN LUTONIX 4X150X130 (BALLOONS) ×4
BALLN LUTONIX 5X150X130 (BALLOONS) ×8
BALLN LUTONIX DCB 4X80X130 (BALLOONS) ×4
BALLN LUTONIX DCB 6X100X130 (BALLOONS) ×4
BALLN ULTRVRSE 4X300X150 (BALLOONS) ×4
BALLN ULTRVRSE 5X300X130 (BALLOONS) ×4
BALLOON LUTONIX 4X150X130 (BALLOONS) ×2 IMPLANT
BALLOON LUTONIX 5X150X130 (BALLOONS) ×4 IMPLANT
BALLOON LUTONIX DCB 4X80X130 (BALLOONS) ×2 IMPLANT
BALLOON LUTONIX DCB 6X100X130 (BALLOONS) ×2 IMPLANT
BALLOON ULTRVRSE 4X300X150 (BALLOONS) ×2 IMPLANT
BALLOON ULTRVRSE 5X300X130 (BALLOONS) ×2 IMPLANT
CATH CROSSER 14S OTW 146CM (CATHETERS) ×4 IMPLANT
CATH G STR 4FX120.038 (CATHETERS) ×4 IMPLANT
CATH PIG 70CM (CATHETERS) ×4 IMPLANT
CATH SKICK ANG 70CM (CATHETERS) ×4 IMPLANT
DEVICE PRESTO INFLATION (MISCELLANEOUS) ×4 IMPLANT
DEVICE STARCLOSE SE CLOSURE (Vascular Products) ×4 IMPLANT
DEVICE TORQUE (MISCELLANEOUS) ×4 IMPLANT
GLIDEWIRE ANGLED SS 035X260CM (WIRE) ×8 IMPLANT
KIT FLOWMATE PROCEDURAL (MISCELLANEOUS) ×4 IMPLANT
LIFESTENT 6X120X130 (Permanent Stent) ×4 IMPLANT
LIFESTENT SOLO 6X200X135 (Permanent Stent) ×4 IMPLANT
NEEDLE ENTRY 21GA 7CM ECHOTIP (NEEDLE) ×4 IMPLANT
PACK ANGIOGRAPHY (CUSTOM PROCEDURE TRAY) ×4 IMPLANT
SET INTRO CAPELLA COAXIAL (SET/KITS/TRAYS/PACK) ×4 IMPLANT
SHEATH BRITE TIP 5FRX11 (SHEATH) ×4 IMPLANT
SHEATH FLEXOR ANSEL2 7FRX45 (SHEATH) ×4 IMPLANT
SHIELD X-DRAPE GOLD 12X17 (MISCELLANEOUS) ×8 IMPLANT
STENT LIFESTENT 5F 5X100X135 (Permanent Stent) ×4 IMPLANT
STENT LIFESTENT 5F 6X100X135 (Permanent Stent) ×3 IMPLANT
SYR MEDRAD MARK V 150ML (SYRINGE) ×4 IMPLANT
TUBING CONTRAST HIGH PRESS 72 (TUBING) ×4 IMPLANT
VALVE HEMO TOUHY BORST Y (VALVE) ×4 IMPLANT
WIRE HI TORQ VERSACORE 300 (WIRE) ×4 IMPLANT
WIRE J 3MM .035X145CM (WIRE) ×4 IMPLANT
WIRE SPARTACORE .014X300CM (WIRE) ×4 IMPLANT

## 2017-01-15 NOTE — Op Note (Signed)
[facility] Percutaneous Study/Intervention Procedural Note   Date of Surgery: 01/15/2017  Surgeon:  Katha Cabal, MD.  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 Introduction catheter into right lower extremity 3rd order catheter placement  2. Contrast injection right lower extremity for distal runoff   3. Crosser atherectomy of the right SFA and popliteal arteries 4.  Percutaneous transluminal angioplasty and stent placement right superficial femoral artery and popliteal              5.   Star close closure left common femoral arteriotomy  Anesthesia: Conscious sedation was administered by the radiology RN under my direct supervision. IV Versed plus fentanyl were utilized. Continuous ECG, pulse oximetry and blood pressure was monitored throughout the entire procedure. Conscious sedation was for a total of 108 minutes.  Sheath: 7 [place] Ansell left common femoral  Contrast: 105 cc  Fluoroscopy Time: 19.7 minutes  Indications: Ricardo Folks Sr. presents with worsening pain of the right lower extremity. Noninvasive studies demonstrated profound atherosclerotic occlusive disease. The risks and benefits are reviewed all questions answered patient agrees to proceed.  Procedure: Ricardo Folks Sr. is a 61 y.o. y.o. male who was identified and appropriate procedural time out was performed. The patient was then placed supine on the table and prepped and draped in the usual sterile fashion.   Ultrasound was placed in the sterile sleeve and the left groin was evaluated the left common femoral artery was echolucent and pulsatile indicating patency.  Image was recorded for the permanent record and under real-time visualization a microneedle was inserted into the common femoral artery microwire  followed by a micro-sheath.  A J-wire was then advanced through the micro-sheath and a  5 [place] sheath was then inserted over a J-wire. J-wire was then advanced and a 5 French pigtail catheter was positioned at the level of T12. AP projection of the aorta was then obtained. Pigtail catheter was repositioned to above the bifurcation and a LAO view of the pelvis was obtained.  Subsequently a pigtail catheter with the stiff angle Glidewire was used to cross the aortic bifurcation the catheter wire were advanced down into the right distal external iliac artery. Oblique view of the femoral bifurcation was then obtained and subsequently the wire was reintroduced and the pigtail catheter negotiated into the SFA representing third order catheter placement. Distal runoff was then performed.  5000 units of heparin was then given and allowed to circulate and a 7 [place] Ansell sheath was advanced up and over the bifurcation and positioned in the femoral artery  The 14 asked Crosser catheter was then prepped on the field and a angled micro- catheter was advanced into the cul-de-sac of the SFA under magnified imaging in the RAO projection. Using the Crosser catheter the occlusion of the SFA and popliteal was negotiated.  Injection of contrast through the Crosser side port confirmed intraluminal positioning.  [place] core wire was then advanced through the port of the Crosser catheter.  Distal runoff was then completed by hand injection.    A 4 x 200 all traverse balloon was used to angioplasty the superficial femoral and popliteal arteries. Inflations were to 14 atmospheres for 1 minutes. Follow-up imaging demonstrated no flow.  Subsequently, a series of Lutonix balloons were utilized beginning with a 4 mm diameter balloon and the popliteal and then increasing to 5 mm diameter within the SFA and 6 mm  at the origin of the SFA inflating to 12 atm for 2 full minutes. Follow-up imaging demonstrated very poor flow with greater  than 50% residual stenosis and therefore a 5 x 100 life stent followed by a 6 x 200 life stent and then a 6 x 100 life stent  was deployed and subsequently postdilated with a 5 mm balloon.  Distal runoff was then reassessed.  After review of these images the sheath is pulled into the left external iliac oblique of the common femoral is obtained and a Star close device deployed. There no immediate Complications.  Findings: The abdominal aorta is opacified with a bolus injection contrast. Renal arteries are patent. The aorta itself has diffuse disease but no hemodynamically significant lesions. The common and external iliac arteries are widely patent bilaterally.  The right common femoral is widely patent as is the profunda femoris.  The SFA and popliteal are occluded there is a flush occlusion at the origin of the SFA.  The distal popliteal reconstitutes and there is significant disease of the trifurcation, with occlusion of the anterior tibial and the posterior tibial. The peroneal peroneal demonstrates non-flow limiting disease throughout its course.   Following angioplasty there is persistent high-grade stenosis within the SFA and popliteal. Therefore, stents or deployed and postdilated to 5 mm with an excellent result. Distal runoff is reassessed and is maintained.   Disposition: Patient was taken to the recovery room in stable condition having tolerated the procedure well.  Ricardo Cervantes 01/15/2017,4:35 PM

## 2017-01-15 NOTE — H&P (Signed)
[facility] History & Physical Update  The patient was interviewed and re-examined.  The patient's previous History and Physical has been reviewed and is unchanged.  There is no change in the plan of care. We plan to proceed with the scheduled procedure.  [name], MD  01/15/2017, 1:45 PM

## 2017-01-15 NOTE — Discharge Instructions (Signed)
Groin Insertion Instructions-If you lose feeling or develop tingling or pain in your leg or foot after the procedure, please walk around first.  If the discomfort does not improve , contact your physician and proceed to the nearest emergency room.  Loss of feeling in your leg might mean that a blockage has formed in the artery and this can be appropriately treated.  Limit your activity for the next two days after your procedure.  Avoid stooping, bending, heavy lifting or exertion as this may put pressure on the insertion site.  Resume normal activities in 48 hours.  You may shower after 24 hours but avoid excessive warm water and do not scrub the site.  Remove clear dressing in 48 hours.  If you have had a closure device inserted, do not soak in a tub bath or a hot tub for at least one week. ° °No driving for 48 hours after discharge.  After the procedure, check the insertion site occasionally.  If any oozing occurs or there is apparent swelling, firm pressure over the site will prevent a bruise from forming.  You can not hurt anything by pressing directly on the site.  The pressure stops the bleeding by allowing a small clot to form.  If the bleeding continues after the pressure has been applied for more than 15 minutes, call 911 or go to the nearest emergency room.   ° °The x-ray dye causes you to pass a considerate amount of urine.  For this reason, you will be asked to drink plenty of liquids after the procedure to prevent dehydration.  You may resume you regular diet.  Avoid caffeine products.   ° °For pain at the site of your procedure, take non-aspirin medicines such as Tylenol. ° °Medications: A. Hold Metformin for 48 hours if applicable.  B. Continue taking all your present medications at home unless your doctor prescribes any changes. ° °Moderate Conscious Sedation, Adult, Care After °These instructions provide you with information about caring for yourself after your procedure. Your health care provider  may also give you more specific instructions. Your treatment has been planned according to current medical practices, but problems sometimes occur. Call your health care provider if you have any problems or questions after your procedure. °What can I expect after the procedure? °After your procedure, it is common: °· To feel sleepy for several hours. °· To feel clumsy and have poor balance for several hours. °· To have poor judgment for several hours. °· To vomit if you eat too soon. °Follow these instructions at home: °For at least 24 hours after the procedure:  ° °· Do not: °¨ Participate in activities where you could fall or become injured. °¨ Drive. °¨ Use heavy machinery. °¨ Drink alcohol. °¨ Take sleeping pills or medicines that cause drowsiness. °¨ Make important decisions or sign legal documents. °¨ Take care of children on your own. °· Rest. °Eating and drinking  °· Follow the diet recommended by your health care provider. °· If you vomit: °¨ Drink water, juice, or soup when you can drink without vomiting. °¨ Make sure you have little or no nausea before eating solid foods. °General instructions  °· Have a responsible adult stay with you until you are awake and alert. °· Take over-the-counter and prescription medicines only as told by your health care provider. °· If you smoke, do not smoke without supervision. °· Keep all follow-up visits as told by your health care provider. This is important. °Contact a health   care provider if: °· You keep feeling nauseous or you keep vomiting. °· You feel light-headed. °· You develop a rash. °· You have a fever. °Get help right away if: °· You have trouble breathing. °This information is not intended to replace advice given to you by your health care provider. Make sure you discuss any questions you have with your health care provider. °Document Released: 06/10/2013 Document Revised: 01/23/2016 Document Reviewed: 12/10/2015 °Elsevier Interactive Patient Education © 2017  [employer] ° °

## 2017-01-15 NOTE — Progress Notes (Signed)
Patient remains clinically stable post procedure,no bleeding nor hematoma at left groin site. Dr Delana Meyer out to speak with patient post op with questions answered. Given loading dose of of plavix with script given to begin daily dose. Pad off with no bleeding. Discharge teaching done with questions answered. To call office tomorrow for return appointment since office now closed.

## 2017-01-24 ENCOUNTER — Encounter (INDEPENDENT_AMBULATORY_CARE_PROVIDER_SITE_OTHER): Payer: Medicare Other

## 2017-01-24 ENCOUNTER — Encounter (INDEPENDENT_AMBULATORY_CARE_PROVIDER_SITE_OTHER): Payer: Medicare Other | Admitting: Vascular Surgery

## 2017-02-07 ENCOUNTER — Other Ambulatory Visit (INDEPENDENT_AMBULATORY_CARE_PROVIDER_SITE_OTHER): Payer: Self-pay | Admitting: Vascular Surgery

## 2017-02-07 ENCOUNTER — Encounter (INDEPENDENT_AMBULATORY_CARE_PROVIDER_SITE_OTHER): Payer: Self-pay | Admitting: Vascular Surgery

## 2017-02-07 ENCOUNTER — Encounter (INDEPENDENT_AMBULATORY_CARE_PROVIDER_SITE_OTHER): Payer: Self-pay

## 2017-02-07 ENCOUNTER — Ambulatory Visit (INDEPENDENT_AMBULATORY_CARE_PROVIDER_SITE_OTHER): Payer: Medicare Other | Admitting: Vascular Surgery

## 2017-02-07 ENCOUNTER — Other Ambulatory Visit (INDEPENDENT_AMBULATORY_CARE_PROVIDER_SITE_OTHER): Payer: Medicare Other

## 2017-02-07 VITALS — BP 135/86 | HR 70 | Resp 16 | Wt 181.0 lb

## 2017-02-07 DIAGNOSIS — I70211 Atherosclerosis of native arteries of extremities with intermittent claudication, right leg: Secondary | ICD-10-CM

## 2017-02-07 DIAGNOSIS — M79604 Pain in right leg: Secondary | ICD-10-CM | POA: Diagnosis not present

## 2017-02-07 NOTE — Progress Notes (Signed)
MRN : [id number]  Ricardo DUGUE Sr. is a 61 y.o. (1956/01/20) male who presents with chief complaint of  Chief Complaint  Patient presents with  . Follow-up  .  History of Present Illness: The patient returns to the office for followup and review of the noninvasive studies. He is s/p right leg crosser atherectomy with PTA and stent on 01/15/2017. There has been any improvement in the lower extremity symptoms.  The patient notes interval shortening of their claudication distance and development of mild rest pain symptoms. No new ulcers or wounds have occurred since the last visit.  There have been no significant changes to the patient's overall health care.  The patient denies amaurosis fugax or recent TIA symptoms. There are no recent neurological changes noted. The patient denies history of DVT, PE or superficial thrombophlebitis. The patient denies recent episodes of angina or shortness of breath.   ABI's Rt=0.60 and Lt=1.11 (previous ABI's Rt=0.55 and Lt=1.15)   Current Meds  Medication Sig  . clopidogrel (PLAVIX) 75 MG tablet Take 1 tablet (75 mg total) by mouth daily.  . Ibuprofen (ADVIL) 200 MG CAPS Take 800 mg by mouth every 8 (eight) hours as needed (for pain.).  [name] sildenafil (REVATIO) 20 MG tablet Take 3-5 pills as needed prior to sex.    Past Medical History:  Diagnosis Date  . Arthritis    Rheumatoid  . Bipolar disorder ([place])   . Chronic kidney disease    kidney stone  . Depression   . Fusion of spine   . Hepatitis C   . History of kidney stones   . Hyperlipidemia   . Pericarditis    20 years ago  . Peripheral vascular disease [facility])     Past Surgical History:  Procedure Laterality Date  . ABDOMINAL AORTOGRAM W/LOWER EXTREMITY N/A 01/15/2017   Procedure: Abdominal Aortogram w/Lower Extremity;  Surgeon: Katha Cabal, MD;  Location: [facility];  Service: Cardiovascular;  Laterality: N/A;  . BACK SURGERY  2004   acdf  . LOWER EXTREMITY  ANGIOGRAPHY Right 01/15/2017   Procedure: Lower Extremity Angiography;  Surgeon: Katha Cabal, MD;  Location: [facility];  Service: Cardiovascular;  Laterality: Right;    Social History Social History  Substance Use Topics  . Smoking status: Current Every Day Smoker    Packs/day: 0.50    Years: 40.00  . Smokeless tobacco: Never Used     Comment: refused by patient  . Alcohol use Yes     Comment: 4 drinks per week    Family History Family History  Problem Relation Age of Onset  . Cancer Mother        liver  . Heart disease Father   . Diabetes Father   . Prostate cancer Neg Hx   . Bladder Cancer Neg Hx   . Kidney cancer Neg Hx     No Known Allergies   REVIEW OF SYSTEMS (Negative unless checked)  Constitutional: [] Weight loss  [] Fever  [] Chills Cardiac: [] Chest pain   [] Chest pressure   [] Palpitations   [] Shortness of breath when laying flat   [] Shortness of breath with exertion. Vascular:  [x] Pain in legs with walking   [x] Pain in legs at rest  [] History of DVT   [] Phlebitis   [] Swelling in legs   [] Varicose veins   [] Non-healing ulcers Pulmonary:   [] Uses home oxygen   [] Productive cough   [] Hemoptysis   [] Wheeze  [] COPD   [] Asthma Neurologic:  []   Dizziness   [] Seizures   [] History of stroke   [] History of TIA  [] Aphasia   [] Vissual changes   [] Weakness or numbness in arm   [] Weakness or numbness in leg Musculoskeletal:   [] Joint swelling   [x] Joint pain   [] Low back pain Hematologic:  [] Easy bruising  [] Easy bleeding   [] Hypercoagulable state   [] Anemic Gastrointestinal:  [] Diarrhea   [] Vomiting  [] Gastroesophageal reflux/heartburn   [] Difficulty swallowing. Genitourinary:  [] Chronic kidney disease   [] Difficult urination  [] Frequent urination   [] Blood in urine Skin:  [] Rashes   [] Ulcers  Psychological:  [] History of anxiety   []  History of major depression.  Physical Examination  Vitals:   02/07/17 1142  BP: 135/86  Pulse: 70  Resp: 16  Weight: 181  lb (82.1 kg)   Body mass index is 23.24 kg/m. Gen: WD/WN, NAD Head: [place]/AT, No temporalis wasting.  Ear/Nose/Throat: Hearing grossly intact, nares w/o erythema or drainage Eyes: PER, EOMI, sclera nonicteric.  Neck: Supple, no large masses.   Pulmonary:  Good air movement, no audible wheezing bilaterally, no use of accessory muscles.  Cardiac: RRR, no JVD Vascular:  Right foot sluggish cap refill no ulcers Vessel Right Left  Radial Palpable Palpable  Ulnar Palpable Palpable  Brachial Palpable Palpable  Carotid Palpable Palpable  Femoral Palpable Palpable  Popliteal Not Palpable Palpable  PT Not Palpable Palpable  DP Not Palpable Palpable  Gastrointestinal: Non-distended. No guarding/no peritoneal signs.  Musculoskeletal: M/S 5/5 throughout.  No deformity or atrophy.  Neurologic: CN 2-12 intact. Symmetrical.  Speech is fluent. Motor exam as listed above. Psychiatric: Judgment intact, Mood & affect appropriate for pt's clinical situation. Dermatologic: No rashes or ulcers noted.  No changes consistent with cellulitis. Lymph : No lichenification or skin changes of chronic lymphedema.  CBC No results found for: WBC, HGB, HCT, MCV, PLT  BMET    Component Value Date/Time   NA 139 12/23/2015 1606   K 4.1 12/23/2015 1606   CL 96 12/23/2015 1606   CO2 24 12/23/2015 1606   GLUCOSE 98 12/23/2015 1606   BUN 28 (H) 01/03/2017 1154   BUN 15 12/23/2015 1606   CREATININE 1.10 01/03/2017 1154   CALCIUM 9.5 12/23/2015 1606   GFRNONAA >60 01/03/2017 1154   GFRAA >60 01/03/2017 1154   CrCl cannot be calculated (Patient's most recent lab result is older than the maximum 21 days allowed.).  COAG No results found for: INR, PROTIME  Radiology No results found.  Assessment/Plan 1. Atherosclerosis of native artery of right lower extremity with intermittent claudication (HCC) Recommend:  The patient has experienced increased symptoms and is now describing lifestyle limiting claudication  and mild rest pain.  He is not improved after his angiogram.   Given the severity of the patient's lower extremity symptoms the patient should undergo angiography and intervention.  Risk and benefits were reviewed the patient.  Indications for the procedure were reviewed.  All questions were answered, the patient agrees to proceed.   The patient should continue walking and begin a more formal exercise program.  The patient should continue antiplatelet therapy and aggressive treatment of the lipid abnormalities  The patient will follow up with me after the angiogram.   2. Pain of right lower extremity See #1    [name], MD  02/07/2017 12:06 PM

## 2017-02-11 ENCOUNTER — Other Ambulatory Visit (INDEPENDENT_AMBULATORY_CARE_PROVIDER_SITE_OTHER): Payer: Self-pay | Admitting: Vascular Surgery

## 2017-02-12 ENCOUNTER — Encounter
Admission: RE | Admit: 2017-02-12 | Discharge: 2017-02-12 | Disposition: A | Discharge status: Home / Self Care | Payer: Medicare Other | Source: Ambulatory Visit | Attending: Vascular Surgery | Admitting: Vascular Surgery

## 2017-02-12 DIAGNOSIS — F17210 Nicotine dependence, cigarettes, uncomplicated: Secondary | ICD-10-CM | POA: Diagnosis not present

## 2017-02-12 DIAGNOSIS — F319 Bipolar disorder, unspecified: Secondary | ICD-10-CM | POA: Diagnosis not present

## 2017-02-12 DIAGNOSIS — Z981 Arthrodesis status: Secondary | ICD-10-CM | POA: Diagnosis not present

## 2017-02-12 DIAGNOSIS — I70221 Atherosclerosis of native arteries of extremities with rest pain, right leg: Secondary | ICD-10-CM | POA: Diagnosis not present

## 2017-02-12 DIAGNOSIS — E785 Hyperlipidemia, unspecified: Secondary | ICD-10-CM | POA: Diagnosis present

## 2017-02-12 DIAGNOSIS — M069 Rheumatoid arthritis, unspecified: Secondary | ICD-10-CM | POA: Diagnosis not present

## 2017-02-12 DIAGNOSIS — Z800 Family history of malignant neoplasm of digestive organs: Secondary | ICD-10-CM

## 2017-02-12 DIAGNOSIS — Z8249 Family history of ischemic heart disease and other diseases of the circulatory system: Secondary | ICD-10-CM | POA: Diagnosis not present

## 2017-02-12 DIAGNOSIS — Z833 Family history of diabetes mellitus: Secondary | ICD-10-CM

## 2017-02-12 DIAGNOSIS — M79604 Pain in right leg: Secondary | ICD-10-CM | POA: Diagnosis present

## 2017-02-12 HISTORY — DX: Headache: R51

## 2017-02-12 HISTORY — DX: Headache, unspecified: R51.9

## 2017-02-12 LAB — CREATININE, SERUM
Creatinine, Ser: 1.06 mg/dL (ref 0.61–1.24)
GFR calc Af Amer: >60 mL/min (ref >60)
GFR calc non Af Amer: >60 mL/min (ref >60)

## 2017-02-12 LAB — BUN: BUN: 19 mg/dL (ref 6–20)

## 2017-02-12 MED ORDER — CEFAZOLIN SODIUM-DEXTROSE 1-4 GM/50ML-% IV SOLN
1.0000 g | Freq: Once | INTRAVENOUS | Status: DC
Start: 2017-02-13 — End: 2017-02-13

## 2017-02-13 ENCOUNTER — Encounter
Admission: RE | Disposition: A | Discharge status: Home / Self Care | Payer: Self-pay | Source: Ambulatory Visit | Attending: Vascular Surgery

## 2017-02-13 ENCOUNTER — Inpatient Hospital Stay
Admission: RE | Admit: 2017-02-13 | Discharge: 2017-02-14 | DRG: 272 | Disposition: A | Discharge status: Home / Self Care | Payer: Medicare Other | Source: Ambulatory Visit | Attending: Vascular Surgery | Admitting: Vascular Surgery

## 2017-02-13 ENCOUNTER — Encounter: Payer: Self-pay | Admitting: Vascular Surgery

## 2017-02-13 DIAGNOSIS — F319 Bipolar disorder, unspecified: Secondary | ICD-10-CM | POA: Diagnosis present

## 2017-02-13 DIAGNOSIS — M79604 Pain in right leg: Secondary | ICD-10-CM | POA: Diagnosis not present

## 2017-02-13 DIAGNOSIS — I70221 Atherosclerosis of native arteries of extremities with rest pain, right leg: Secondary | ICD-10-CM | POA: Diagnosis present

## 2017-02-13 DIAGNOSIS — F17210 Nicotine dependence, cigarettes, uncomplicated: Secondary | ICD-10-CM | POA: Diagnosis present

## 2017-02-13 DIAGNOSIS — Z800 Family history of malignant neoplasm of digestive organs: Secondary | ICD-10-CM | POA: Diagnosis not present

## 2017-02-13 DIAGNOSIS — Z833 Family history of diabetes mellitus: Secondary | ICD-10-CM | POA: Diagnosis not present

## 2017-02-13 DIAGNOSIS — M069 Rheumatoid arthritis, unspecified: Secondary | ICD-10-CM | POA: Diagnosis present

## 2017-02-13 DIAGNOSIS — E785 Hyperlipidemia, unspecified: Secondary | ICD-10-CM | POA: Diagnosis present

## 2017-02-13 DIAGNOSIS — Z8249 Family history of ischemic heart disease and other diseases of the circulatory system: Secondary | ICD-10-CM | POA: Diagnosis not present

## 2017-02-13 DIAGNOSIS — Z981 Arthrodesis status: Secondary | ICD-10-CM | POA: Diagnosis not present

## 2017-02-13 HISTORY — PX: LOWER EXTREMITY ANGIOGRAPHY: CATH118251

## 2017-02-13 SURGERY — LOWER EXTREMITY ANGIOGRAPHY
Anesthesia: Moderate Sedation | Laterality: Right

## 2017-02-13 MED ORDER — CEFAZOLIN SODIUM-DEXTROSE 2-4 GM/100ML-% IV SOLN
2.0000 g | Freq: Once | INTRAVENOUS | Status: AC
  Administered 2017-02-13: 2 g via INTRAVENOUS

## 2017-02-13 MED ORDER — SODIUM CHLORIDE 0.9 % IV SOLN
500.0000 mL | Freq: Once | INTRAVENOUS | Status: AC | PRN
  Administered 2017-02-13: 500 mL via INTRAVENOUS

## 2017-02-13 MED ORDER — HEPARIN SODIUM (PORCINE) 1000 UNIT/ML IJ SOLN
INTRAMUSCULAR | Status: AC
  Filled 2017-02-13: qty 1

## 2017-02-13 MED ORDER — MIDAZOLAM HCL 2 MG/2ML IJ SOLN
INTRAMUSCULAR | Status: DC | PRN
  Administered 2017-02-13 (×2): 2 mg via INTRAVENOUS
  Administered 2017-02-13 (×4): 1 mg via INTRAVENOUS

## 2017-02-13 MED ORDER — ALTEPLASE 2 MG IJ SOLR
INTRAMUSCULAR | Status: DC | PRN
  Administered 2017-02-13: 10 mg

## 2017-02-13 MED ORDER — OXYCODONE HCL 5 MG PO TABS
5.0000 mg | ORAL_TABLET | ORAL | Status: DC | PRN
  Administered 2017-02-14 (×2): 10 mg via ORAL
  Filled 2017-02-13 (×2): qty 2

## 2017-02-13 MED ORDER — MAGNESIUM CITRATE PO SOLN: 1.0000 | Freq: Once | ORAL | Status: DC | PRN

## 2017-02-13 MED ORDER — SODIUM CHLORIDE 0.9 % IJ SOLN
INTRAMUSCULAR | Status: AC
  Filled 2017-02-13: qty 50

## 2017-02-13 MED ORDER — PANTOPRAZOLE SODIUM 40 MG PO TBEC: 40.0000 mg | DELAYED_RELEASE_TABLET | Freq: Every day | ORAL | Status: DC

## 2017-02-13 MED ORDER — POLYETHYLENE GLYCOL 3350 17 G PO PACK: 17.0000 g | PACK | Freq: Every day | ORAL | Status: DC | PRN

## 2017-02-13 MED ORDER — IBUPROFEN 800 MG PO TABS
800.0000 mg | ORAL_TABLET | Freq: Three times a day (TID) | ORAL | Status: DC | PRN
  Filled 2017-02-13: qty 1

## 2017-02-13 MED ORDER — CEFAZOLIN SODIUM-DEXTROSE 1-4 GM/50ML-% IV SOLN
1.0000 g | Freq: Three times a day (TID) | INTRAVENOUS | Status: DC
  Administered 2017-02-13 – 2017-02-14 (×2): 1 g via INTRAVENOUS
  Filled 2017-02-13 (×2): qty 50

## 2017-02-13 MED ORDER — METHYLPREDNISOLONE SODIUM SUCC 125 MG IJ SOLR: 125.0000 mg | INTRAMUSCULAR | Status: DC | PRN

## 2017-02-13 MED ORDER — FENTANYL CITRATE (PF) 100 MCG/2ML IJ SOLN
INTRAMUSCULAR | Status: DC | PRN
  Administered 2017-02-13 (×2): 50 ug via INTRAVENOUS
  Administered 2017-02-13: 100 ug via INTRAVENOUS
  Administered 2017-02-13 (×3): 50 ug via INTRAVENOUS

## 2017-02-13 MED ORDER — DIPHENHYDRAMINE HCL 50 MG/ML IJ SOLN
INTRAMUSCULAR | Status: AC
  Filled 2017-02-13: qty 1

## 2017-02-13 MED ORDER — TIROFIBAN (AGGRASTAT) BOLUS VIA INFUSION
25.0000 ug/kg | Freq: Once | INTRAVENOUS | Status: AC
  Administered 2017-02-13: 2097.5 ug via INTRAVENOUS
  Filled 2017-02-13: qty 42

## 2017-02-13 MED ORDER — HEPARIN (PORCINE) IN NACL 2-0.9 UNIT/ML-% IJ SOLN
INTRAMUSCULAR | Status: AC
  Filled 2017-02-13: qty 500

## 2017-02-13 MED ORDER — DOCUSATE SODIUM 100 MG PO CAPS: 100.0000 mg | ORAL_CAPSULE | Freq: Every day | ORAL | Status: DC

## 2017-02-13 MED ORDER — HEPARIN SODIUM (PORCINE) 1000 UNIT/ML IJ SOLN
INTRAMUSCULAR | Status: DC | PRN
  Administered 2017-02-13 (×2): 1000 [IU] via INTRAVENOUS

## 2017-02-13 MED ORDER — IOPAMIDOL (ISOVUE-300) INJECTION 61%
INTRAVENOUS | Status: DC | PRN
  Administered 2017-02-13: 75 mL via INTRA_ARTERIAL

## 2017-02-13 MED ORDER — NITROGLYCERIN 5 MG/ML IV SOLN
INTRAVENOUS | Status: AC
  Filled 2017-02-13: qty 10

## 2017-02-13 MED ORDER — ONDANSETRON HCL 4 MG/2ML IJ SOLN: 4.0000 mg | Freq: Four times a day (QID) | INTRAMUSCULAR | Status: DC | PRN

## 2017-02-13 MED ORDER — FENTANYL CITRATE (PF) 100 MCG/2ML IJ SOLN
INTRAMUSCULAR | Status: AC
  Filled 2017-02-13: qty 2

## 2017-02-13 MED ORDER — NITROGLYCERIN 1 MG/10 ML FOR IR/CATH LAB
INTRA_ARTERIAL | Status: DC | PRN
  Administered 2017-02-13: 500 ug
  Administered 2017-02-13: 750 ug

## 2017-02-13 MED ORDER — SODIUM CHLORIDE 0.9 % IV SOLN
INTRAVENOUS | Status: AC | PRN
  Administered 2017-02-13: 250 mL via INTRAVENOUS

## 2017-02-13 MED ORDER — LIDOCAINE HCL (PF) 1 % IJ SOLN
INTRAMUSCULAR | Status: AC
  Filled 2017-02-13: qty 30

## 2017-02-13 MED ORDER — TIROFIBAN HCL IN NACL 5-0.9 MG/100ML-% IV SOLN
0.1500 ug/kg/min | INTRAVENOUS | Status: AC
  Administered 2017-02-13 – 2017-02-14 (×3): 0.15 ug/kg/min via INTRAVENOUS
  Filled 2017-02-13 (×5): qty 100

## 2017-02-13 MED ORDER — FAMOTIDINE 20 MG PO TABS: 40.0000 mg | ORAL_TABLET | ORAL | Status: DC | PRN

## 2017-02-13 MED ORDER — MIDAZOLAM HCL 5 MG/5ML IJ SOLN
INTRAMUSCULAR | Status: AC
  Filled 2017-02-13: qty 5

## 2017-02-13 MED ORDER — HYDROMORPHONE HCL 1 MG/ML IJ SOLN: 1.0000 mg | Freq: Once | INTRAMUSCULAR | Status: DC | PRN

## 2017-02-13 MED ORDER — SODIUM CHLORIDE 0.9 % IV SOLN: INTRAVENOUS | Status: DC

## 2017-02-13 MED ORDER — DIPHENHYDRAMINE HCL 50 MG/ML IJ SOLN
INTRAMUSCULAR | Status: DC | PRN
  Administered 2017-02-13 (×2): 25 mg via INTRAVENOUS

## 2017-02-13 MED ORDER — SORBITOL 70 % SOLN: 30.0000 mL | Freq: Every day | Status: DC | PRN

## 2017-02-13 MED ORDER — LABETALOL HCL 5 MG/ML IV SOLN: 10.0000 mg | INTRAVENOUS | Status: DC | PRN

## 2017-02-13 MED ORDER — METOPROLOL TARTRATE 5 MG/5ML IV SOLN: 5.0000 mg | Freq: Four times a day (QID) | INTRAVENOUS | Status: DC

## 2017-02-13 MED ORDER — SODIUM CHLORIDE 0.9 % IV SOLN
INTRAVENOUS | Status: DC
  Administered 2017-02-13: 10:00:00 via INTRAVENOUS

## 2017-02-13 MED ORDER — HYDRALAZINE HCL 20 MG/ML IJ SOLN: 5.0000 mg | INTRAMUSCULAR | Status: DC | PRN

## 2017-02-13 MED ORDER — ALUM & MAG HYDROXIDE-SIMETH 200-200-20 MG/5ML PO SUSP: 15.0000 mL | ORAL | Status: DC | PRN

## 2017-02-13 MED ORDER — ACETAMINOPHEN 325 MG RE SUPP: 325.0000 mg | RECTAL | Status: DC | PRN

## 2017-02-13 MED ORDER — HEPARIN (PORCINE) IN NACL 2-0.9 UNIT/ML-% IJ SOLN
INTRAMUSCULAR | Status: AC
  Filled 2017-02-13: qty 1000

## 2017-02-13 MED ORDER — ACETAMINOPHEN 325 MG PO TABS: 325.0000 mg | ORAL_TABLET | ORAL | Status: DC | PRN

## 2017-02-13 MED ORDER — MORPHINE SULFATE (PF) 4 MG/ML IV SOLN
2.0000 mg | INTRAVENOUS | Status: DC | PRN
Start: 2017-02-13 — End: 2017-02-14

## 2017-02-13 MED ORDER — CLOPIDOGREL BISULFATE 75 MG PO TABS: 75.0000 mg | ORAL_TABLET | Freq: Every day | ORAL | Status: DC

## 2017-02-13 MED ORDER — ALTEPLASE 2 MG IJ SOLR
INTRAMUSCULAR | Status: AC
  Filled 2017-02-13: qty 10

## 2017-02-13 SURGICAL SUPPLY — 34 items
BALLN LUTONIX 5X150X130 (BALLOONS) ×3
BALLN LUTONIX 6X150X130 (BALLOONS) ×9
BALLN LUTONIX DCB 4X80X130 (BALLOONS) ×3
BALLN LUTONIX DCB 7X60X130 (BALLOONS) ×3
BALLN ULTRVRSE 3X40X130C (BALLOONS) ×3
BALLOON LUTONIX 5X150X130 (BALLOONS) ×1 IMPLANT
BALLOON LUTONIX 6X150X130 (BALLOONS) ×3 IMPLANT
BALLOON LUTONIX DCB 4X80X130 (BALLOONS) ×1 IMPLANT
BALLOON LUTONIX DCB 7X60X130 (BALLOONS) ×1 IMPLANT
BALLOON ULTRVRSE 3X40X130C (BALLOONS) ×1 IMPLANT
CANISTER PENUMBRA MAX (MISCELLANEOUS) ×3 IMPLANT
CATH BEACON 5 .035 65 RIM TIP (CATHETERS) ×3 IMPLANT
CATH BEACON 5 .038 100 VERT TP (CATHETERS) ×3 IMPLANT
CATH CXI SUPP ANG 4FR 135 (MICROCATHETER) ×1 IMPLANT
CATH CXI SUPP ANG 4FR 135CM (MICROCATHETER) ×3
CATH INDIGO 6 ST-TIP 135CM (CATHETERS) ×3 IMPLANT
DEVICE PRESTO INFLATION (MISCELLANEOUS) ×3 IMPLANT
DEVICE STARCLOSE SE CLOSURE (Vascular Products) ×3 IMPLANT
DEVICE TORQUE (MISCELLANEOUS) ×3 IMPLANT
GLIDEWIRE ANGLED SS 035X260CM (WIRE) ×3 IMPLANT
GOWN STRL XL  DISP (MISCELLANEOUS) ×6 IMPLANT
NEEDLE ENTRY 21GA 7CM ECHOTIP (NEEDLE) ×3 IMPLANT
PACK ANGIOGRAPHY (CUSTOM PROCEDURE TRAY) ×3 IMPLANT
SET INTRO CAPELLA COAXIAL (SET/KITS/TRAYS/PACK) ×3 IMPLANT
SHEATH ANL2 6FRX45 HC (SHEATH) ×3 IMPLANT
SHEATH BRITE TIP 7FRX11 (SHEATH) ×3 IMPLANT
SHEATH PINNACLE ST 6F 45CM (SHEATH) ×3 IMPLANT
SHIELD RADPAD SCOOP 12X17 (MISCELLANEOUS) ×3 IMPLANT
STENT LIFESTENT 5F 7X30X135 (Permanent Stent) ×3 IMPLANT
TUBING ASPIRATION INDIGO (MISCELLANEOUS) ×3 IMPLANT
WIRE HI TORQ VERSACORE 300 (WIRE) ×3 IMPLANT
WIRE J 3MM .035X145CM (WIRE) ×3 IMPLANT
WIRE ROSEN-J .035X260CM (WIRE) ×3 IMPLANT
WIRE SPARTACORE .014X300CM (WIRE) ×3 IMPLANT

## 2017-02-13 NOTE — Progress Notes (Signed)
PAD in place. No hematoma noted. Minimal drainage. Patient is without complaint. Continues Aggrastat.

## 2017-02-13 NOTE — Op Note (Signed)
[facility] Percutaneous Study/Intervention Procedural Note   Date of Surgery: 02/13/2017  Surgeon: [name]  Pre-operative Diagnosis: Atherosclerotic occlusive disease bilateral lower extremities with right lower extremity rest pain  Post-operative diagnosis: Same  Procedure(s) Performed: 1. Introduction catheter into right lower extremity 3rd order catheter placement  2. Contrast injection right lower extremity for distal runoff  3. Percutaneous transluminal angioplasty and stent placement right superficial femoral artery to 7 mm with a 7 x 30 mm life stent at the origin of the SFA 4. Percutaneous transluminal angioplasty right peroneal to 3 mm  5. Mechanical thrombectomy right SFA popliteal and peroneal using the penumbra 6 catheter with 10 mg of TPA.             6.  Star close closure left common femoral arteriotomy  Anesthesia: Conscious sedation was administered under my direct supervision by the interventional radiology RN. IV Versed plus fentanyl were utilized. Continuous ECG, pulse oximetry and blood pressure was monitored throughout the entire procedure.  Conscious sedation was for a total of 2 hour 21 minutes.  Sheath: 6 French destination sheath left common femoral  Contrast: 75 cc  Fluoroscopy Time: 24.6 minutes  Indications: Matthew Folks Sr. presents with increasing pain of the right lower extremity. Proximally 1 month ago he underwent revascularization of his right lower extremity. At that time he was found to have an occlusion of his SFA at the origin which extended down to the mid popliteal. He was noted to have single vessel runoff at that time as well via the peroneal. Last week he presented to the office for routine follow-up complaining of the recurrence of his rest pain. Noninvasive studies demonstrated an ABI of 0.44. This suggests the patient is having limb  threatening ischemia. The risks and benefits are reviewed all questions answered patient agrees to proceed.  Procedure:Matthew Folks Sr. is a 61 y.o. y.o. male who was identified and appropriate procedural time out was performed. The patient was then placed supine on the table and prepped and draped in the usual sterile fashion.   Ultrasound was placed in the sterile sleeve and the left groin was evaluated the left common femoral artery was echolucent and pulsatile indicating patency. Image was recorded for the permanent record and under real-time visualization a microneedle was inserted into the common femoral artery followed by the microwire and then the micro-sheath. A J-wire was then advanced through the micro-sheath and a 5 [place] sheath was then inserted over a J-wire. J-wire was then advanced and a 6 [place] Ansell sheath was advanced into the distal aorta. Hand injection contrast was used to demonstrate the aortic bifurcation and an LAO projection. Subsequently a rim catheter with the stiff angle Glidewire was used to cross the aortic bifurcation the catheter wire were advanced down into the right distal external iliac artery. Oblique view of the femoral bifurcation was then obtained and subsequently the wire was reintroduced and the pigtail catheter negotiated into the SFA representing third order catheter placement. Distal runoff was then performed.  5000 units of heparin was then given and allowed to circulate and a destination sheath by [employer] 6 [place] was exchanged for the [employer]   Straight catheter and stiff angle Glidewire were then negotiated down into the distal popliteal. Catheter was then advanced. Hand injection contrast demonstrated the tibial anatomy in detail, which showed thrombus in the distal popliteal as well as thrombus within the peritoneal.  10 mg of TPA was then reconstituted in 20  cc and this was laced from the distal popliteal up to the origin of the SFA. 30 minute  12 time was then allowed. The penumbra cat 6 catheter was then advanced from the origin the SFA down into the popliteal was then used to perform thrombectomy within the peroneal as well. After several passes and both vessels were made follow-up imaging demonstrated that there now was forward flow. It also appeared there was a subtotal occlusion of the peroneal in its proximal one third at several locations as well as critical stenosis within the distal popliteal. There also appears to be a greater than 70% abnormality associated with residual thrombus at the origin of the SFA. There is diffuse small nonflow limiting segments of thrombus noted throughout the SFA and popliteal in the previously stented segments.  A first-quarter wires then advanced down and positioned with its tip in the distal peroneal and a 3 mm x 10 cm Ultraverse balloon was used to angioplasty the peroneal.. Each inflation was for 2 minutes at 12 atm. Follow-up imaging demonstrated excellent patency with preservation of the single-vessel distal runoff.  The detector was then repositioned and the SFA and popliteal was reimaged and the multiple stenoses within the popliteal extending more proximally as described above were present. Initially a 4 mm Lutonix balloon was used to angioplasty the distal popliteal subsequent layer 5 mm Lutonix balloon was used. A 6 mm Lutonix balloons were then used to angioplasty the entire length of the above-the-knee popliteal SFA. These inflations were to 12 atm for 1-2 minutes. After and plasty with 6 mm residual stenosis was still noted at the origin and a 7 mm x 30 mm life stent is deployed to the origin of the SFA. This is then postdilated with a 7 x 6 Lutonix balloon inflated to 12 atm for 1 minute. is noted in this area and after appropriate measurements are made a 6 x 80 Lutonix balloon was used to angioplasty the superficial femoral and popliteal arteries. Inflations were to 12 atmospheres for 2 minutes.  Follow-up imaging demonstrated patency with excellent result. Distal runoff was then reassessed and noted to be patent with sluggish flow throughout but no focal abnormalities identified. Nitroglycerin has been administered intra-arterially several times during the procedure another aliquot is given. And the decision is made to initiate him on Aggrastat therapy overnight.   After review of these images the sheath is pulled into the left external iliac oblique of the common femoral is obtained and a Star close device deployed. There no immediate Complications.  Findings: The distal aorta itself has diffuse disease but no hemodynamically significant lesions. The common and external iliac arteries are widely patent bilaterally.  The right common femoral is widely patent as is the profunda femoris.  The SFA does indeed have occlusion throughout the stented segment including the popliteal..  The distal popliteal demonstrates increasing disease and the trifurcation is heavily diseased with occlusion of the anterior tibial and posterior tibial. The peroneal demonstrates a subtotal occlusion in its proximal portion.  Following mechanical thrombectomy we have reestablished flow.   Following angioplasty peroneal there is now in-line flow and looks quite nice. Angioplasty and stent placement of the SFA at Hunter's canal yields an excellent result with less than 15% residual stenosis.  Summary: Successful recanalization right lower extremity for limb salvage   Disposition: Patient was taken to the recovery room in stable condition having tolerated the procedure well.  Belenda Cruise Rahim Astorga 02/13/2017,2:30 PM

## 2017-02-13 NOTE — Progress Notes (Signed)
Report called to [facility] on 2 A. Pt transported to room 248, awake following commands. Hemodynamically stable. Left groin accessed with two RNs at bedside on floor with no bleeding at this time. Pt left in care of 2A RN. Dr. Ronalee Belts aware of transport to floor, Per MD will see Pt this evening.

## 2017-02-13 NOTE — Progress Notes (Addendum)
Pt left groin site slowly oozing small amount of blood/ PAD remains in place- 54ml of additional air added / manual pressure held also per MD order/ Dr. Delana Meyer made aware/ Sr. Schner's PA also made aware/ stated she would assess pt/ orders to continue gtt and monitor site closely/ pt informed to notify RN if bleeding worsen

## 2017-02-13 NOTE — H&P (Signed)
[facility] History & Physical Update  The patient was interviewed and re-examined.  The patient's previous History and Physical has been reviewed and is unchanged.  There is no change in the plan of care. We plan to proceed with the scheduled procedure.  [name], MD  02/13/2017, 9:27 AM

## 2017-02-14 ENCOUNTER — Encounter: Payer: Self-pay | Admitting: Vascular Surgery

## 2017-02-14 DIAGNOSIS — I70221 Atherosclerosis of native arteries of extremities with rest pain, right leg: Secondary | ICD-10-CM | POA: Diagnosis not present

## 2017-02-14 LAB — BASIC METABOLIC PANEL
ANION GAP: 3 — AB (ref 5–15)
BUN: 22 mg/dL — ABNORMAL HIGH (ref 6–20)
CO2: 27 mmol/L (ref 22–32)
Calcium: 8.2 mg/dL — ABNORMAL LOW (ref 8.9–10.3)
Chloride: 104 mmol/L (ref 101–111)
Creatinine, Ser: 0.93 mg/dL (ref 0.61–1.24)
GFR calc non Af Amer: >60 mL/min (ref >60)
Glucose, Bld: 102 mg/dL — ABNORMAL HIGH (ref 65–99)
POTASSIUM: 4 mmol/L (ref 3.5–5.1)
SODIUM: 134 mmol/L — AB (ref 135–145)

## 2017-02-14 LAB — CBC
HEMATOCRIT: 36.9 % — AB (ref 40.0–52.0)
Hemoglobin: 12.6 g/dL — ABNORMAL LOW (ref 13.0–18.0)
MCH: 32.5 pg (ref 26.0–34.0)
MCHC: 34.3 g/dL (ref 32.0–36.0)
MCV: 94.8 fL (ref 80.0–100.0)
Platelets: 115 10*3/uL — ABNORMAL LOW (ref 150–440)
RBC: 3.89 MIL/uL — AB (ref 4.40–5.90)
RDW: 13.7 % (ref 11.5–14.5)
WBC: 5.5 10*3/uL (ref 3.8–10.6)

## 2017-02-14 MED ORDER — OXYCODONE HCL 5 MG PO TABS: 5.0000 mg | ORAL_TABLET | ORAL | 0 refills | Status: DC | PRN

## 2017-02-14 MED ORDER — APIXABAN 5 MG PO TABS
5.0000 mg | ORAL_TABLET | Freq: Two times a day (BID) | ORAL | Status: DC
  Administered 2017-02-14: 5 mg via ORAL
  Filled 2017-02-14: qty 1

## 2017-02-14 MED ORDER — LIDOCAINE-EPINEPHRINE (PF) 2 %-1:200000 IJ SOLN
INTRAMUSCULAR | Status: AC
  Filled 2017-02-14: qty 20

## 2017-02-14 MED ORDER — APIXABAN 5 MG PO TABS: 5.0000 mg | ORAL_TABLET | Freq: Two times a day (BID) | ORAL | 5 refills | Status: DC

## 2017-02-14 NOTE — Progress Notes (Signed)
Left groin site continues to ooze slowly. No complaints from pt other than generalized soreness from "being in bed all day and not moving around much".  PAD in place, drainage assessed with second RN. Will continue to monitor.

## 2017-02-14 NOTE — Discharge Summary (Signed)
[facility]    Discharge Summary    Patient ID:  Ricardo PAULE Sr. MRN: [id number] DOB/AGE: 11-07-1955 61 y.o.  Admit date: 02/13/2017 Discharge date: 02/14/2017 Date of Surgery: 02/13/2017 Surgeon: Surgeon(s): Jamoni Hewes, Dolores Lory, MD  Admission Diagnosis: Atherosclerosis of native artery of right lower extremity with rest pain [facility]) [I70.221]  Discharge Diagnoses:  Atherosclerosis of native artery of right lower extremity with rest pain ([place]) [I70.221]  Secondary Diagnoses: Past Medical History:  Diagnosis Date  . Arthritis    Rheumatoid  . Bipolar disorder ([place])   . Depression   . Fusion of spine   . Headache   . Hepatitis C    HEP "C"  . History of kidney stones   . Hyperlipidemia   . Pericarditis    20 years ago  . Peripheral vascular disease ([place])     Procedure(s): Lower Extremity Angiography  Discharged Condition: good  HPI:  Patient presented yesterday for revascularization of the right lower extremity. He was taken to special procedures where successful intervention was performed. There was a mild to moderate diffuse thrombus burden noted at the conclusion as well as sluggish flow secondary to distal outflow and therefore it was elected to initiate Aggrastat therapy.  Patient was maintained on Aggrastat per protocol and did well. This morning he notes his foot is feeling much better. By physical exam he has a 1+ palpable dorsalis pedis pulse on the right. His left groin has some skin edge ooze but there is no evidence of hematoma. He was treated with a lidocaine with epinephrine injection and mild pressure this resolved the using. Aggrastat was discontinued and he was placed on [facility] Course:  Ricardo Folks Sr. is a 61 y.o. male is S/P Right Procedure(s): Lower Extremity Angiography Extubated: POD # 0 Physical exam: 1+ palpable DP pulse on the right Post-op wounds clean, dry, intact or healing well Pt. Ambulating,  voiding and taking PO diet without difficulty. Pt pain controlled with PO pain meds. Labs as below Complications:none  Consults:    Significant Diagnostic Studies: CBC Lab Results  Component Value Date   WBC 5.5 02/14/2017   HGB 12.6 (L) 02/14/2017   HCT 36.9 (L) 02/14/2017   MCV 94.8 02/14/2017   PLT 115 (L) 02/14/2017    BMET    Component Value Date/Time   NA 134 (L) 02/14/2017 0354   NA 139 12/23/2015 1606   K 4.0 02/14/2017 0354   CL 104 02/14/2017 0354   CO2 27 02/14/2017 0354   GLUCOSE 102 (H) 02/14/2017 0354   BUN 22 (H) 02/14/2017 0354   BUN 15 12/23/2015 1606   CREATININE 0.93 02/14/2017 0354   CALCIUM 8.2 (L) 02/14/2017 0354   GFRNONAA >60 02/14/2017 0354   GFRAA >60 02/14/2017 0354   COAG No results found for: INR, PROTIME   Disposition:  Discharge to :Home Discharge Instructions    Call MD for:  redness, tenderness, or signs of infection (pain, swelling, bleeding, redness, odor or green/yellow discharge around incision site)    Complete by:  As directed    Call MD for:  severe or increased pain, loss or decreased feeling  in affected limb(s)    Complete by:  As directed    Call MD for:  temperature >100.5    Complete by:  As directed    Discharge instructions    Complete by:  As directed    OK to shower after 24 hours   Driving Restrictions  Complete by:  As directed    No driving for 48 hours   Lifting restrictions    Complete by:  As directed    No lifting > 20 pounds for 1 week   Resume previous diet    Complete by:  As directed      Allergies as of 02/14/2017   No Known Allergies     Medication List    STOP taking these medications   clopidogrel 75 MG tablet Commonly known as:  PLAVIX     TAKE these medications   ADVIL 200 MG Caps Generic drug:  Ibuprofen Take 800 mg by mouth every 8 (eight) hours as needed (for pain).   apixaban 5 MG Tabs tablet Commonly known as:  ELIQUIS Take 1 tablet (5 mg total) by mouth 2 (two)  times daily.   oxyCODONE 5 MG immediate release tablet Commonly known as:  Oxy IR/ROXICODONE Take 1-2 tablets (5-10 mg total) by mouth every 4 (four) hours as needed for moderate pain.   sildenafil 20 MG tablet Commonly known as:  REVATIO Take 3-5 pills as needed prior to sex. What changed:  how much to take  how to take this  when to take this  reasons to take this  additional instructions      Verbal and written Discharge instructions given to the patient. Wound care per Discharge AVS Follow-up Information    Kennan Detter, Dolores Lory, MD. Go on 04/01/2017.   Specialties:  Vascular Surgery, Cardiology, Radiology, Vascular Surgery Why:  with ABI, Appointment Time: 7:30am, this was the first available appointment. Thanks  Contact information: [place] [address] [phone]           Signed: [name], MD  02/14/2017, 7:05 PM

## 2017-02-14 NOTE — Discharge Instructions (Signed)
Per Dr. Delana Meyer, take a Bayer Aspirin (Enteric Coated) 81mg  tablet daily.  May get this over the counter.    Do not take Advil for pain until you follow up with Dr. Delana Meyer.  You may take Tylenol.

## 2017-02-14 NOTE — Progress Notes (Signed)
Patient alert and oriented, vss, no complaints of pain.  Given eliquis.  No questions at this time.  Escorted out of hospital.    Groin site intact.

## 2017-02-14 NOTE — Care Management (Signed)
Patient to discharge today with new Eliquis.  Vascular is to give patient "starter" samples.  CM provided patient with 30 day trial coupon for medicare patients.  Patient verbally confirms he has part D coverage and only has to pay 3 dollars copay for his medications.

## 2017-03-12 ENCOUNTER — Ambulatory Visit (INDEPENDENT_AMBULATORY_CARE_PROVIDER_SITE_OTHER): Payer: Medicare Other

## 2017-03-12 VITALS — BP 126/70 | HR 74 | Temp 97.7°F | Ht 74.0 in | Wt 178.0 lb

## 2017-03-12 DIAGNOSIS — Z0000 Encounter for general adult medical examination without abnormal findings: Secondary | ICD-10-CM

## 2017-03-12 NOTE — Patient Instructions (Addendum)
Mr. Schara , Thank you for taking time to come for your Medicare Wellness Visit. I appreciate your ongoing commitment to your health goals. Please review the following plan we discussed and let me know if I can assist you in the future.   Screening recommendations/referrals: Colonoscopy: due- provided with cologuard information to look over  Recommended yearly ophthalmology/optometry visit for glaucoma screening and checkup Recommended yearly dental visit for hygiene and checkup  Vaccinations: Influenza vaccine: due 05/2017 Pneumococcal vaccine: due at age 80 Tdap vaccine: due, check with your insurance company for coverage Shingles vaccine: due at age 70  Advanced directives:Advance directive discussed with you today. I have provided a copy for you to complete at home and have notarized. Once this is complete please bring a copy in to our office so we can scan it into your chart.  Conditions/risks identified: Smoking cessation disscussed  Next appointment: Follow up in one year for your annual wellness exam.   Preventive Care 40-64 Years, Male Preventive care refers to lifestyle choices and visits with your health care provider that can promote health and wellness. What does preventive care include?  A yearly physical exam. This is also called an annual well check.  Dental exams once or twice a year.  Routine eye exams. Ask your health care provider how often you should have your eyes checked.  Personal lifestyle choices, including:  Daily care of your teeth and gums.  Regular physical activity.  Eating a healthy diet.  Avoiding tobacco and drug use.  Limiting alcohol use.  Practicing safe sex.  Taking low-dose aspirin every day starting at age 31. What happens during an annual well check? The services and screenings done by your health care provider during your annual well check will depend on your age, overall health, lifestyle risk factors, and family history of  disease. Counseling  Your health care provider may ask you questions about your:  Alcohol use.  Tobacco use.  Drug use.  Emotional well-being.  Home and relationship well-being.  Sexual activity.  Eating habits.  Work and work Statistician. Screening  You may have the following tests or measurements:  Height, weight, and BMI.  Blood pressure.  Lipid and cholesterol levels. These may be checked every 5 years, or more frequently if you are over 73 years old.  Skin check.  Lung cancer screening. You may have this screening every year starting at age 62 if you have a 30-pack-year history of smoking and currently smoke or have quit within the past 15 years.  Fecal occult blood test (FOBT) of the stool. You may have this test every year starting at age 64.  Flexible sigmoidoscopy or colonoscopy. You may have a sigmoidoscopy every 5 years or a colonoscopy every 10 years starting at age 45.  Prostate cancer screening. Recommendations will vary depending on your family history and other risks.  Hepatitis C blood test.  Hepatitis B blood test.  Sexually transmitted disease (STD) testing.  Diabetes screening. This is done by checking your blood sugar (glucose) after you have not eaten for a while (fasting). You may have this done every 1-3 years. Discuss your test results, treatment options, and if necessary, the need for more tests with your health care provider. Vaccines  Your health care provider may recommend certain vaccines, such as:  Influenza vaccine. This is recommended every year.  Tetanus, diphtheria, and acellular pertussis (Tdap, Td) vaccine. You may need a Td booster every 10 years.  Zoster vaccine. You may need this  after age 73.  Pneumococcal 13-valent conjugate (PCV13) vaccine. You may need this if you have certain conditions and have not been vaccinated.  Pneumococcal polysaccharide (PPSV23) vaccine. You may need one or two doses if you smoke cigarettes  or if you have certain conditions. Talk to your health care provider about which screenings and vaccines you need and how often you need them. This information is not intended to replace advice given to you by your health care provider. Make sure you discuss any questions you have with your health care provider. Document Released: 09/16/2015 Document Revised: 05/09/2016 Document Reviewed: 06/21/2015 Elsevier Interactive Patient Education  2017 [employer] Prevention in the Home Falls can cause injuries. They can happen to people of all ages. There are many things you can do to make your home safe and to help prevent falls. What can I do on the outside of my home?  Regularly fix the edges of walkways and driveways and fix any cracks.  Remove anything that might make you trip as you walk through a door, such as a raised step or threshold.  Trim any bushes or trees on the path to your home.  Use bright outdoor lighting.  Clear any walking paths of anything that might make someone trip, such as rocks or tools.  Regularly check to see if handrails are loose or broken. Make sure that both sides of any steps have handrails.  Any raised decks and porches should have guardrails on the edges.  Have any leaves, snow, or ice cleared regularly.  Use sand or salt on walking paths during winter.  Clean up any spills in your garage right away. This includes oil or grease spills. What can I do in the bathroom?  Use night lights.  Install grab bars by the toilet and in the tub and shower. Do not use towel bars as grab bars.  Use non-skid mats or decals in the tub or shower.  If you need to sit down in the shower, use a plastic, non-slip stool.  Keep the floor dry. Clean up any water that spills on the floor as soon as it happens.  Remove soap buildup in the tub or shower regularly.  Attach bath mats securely with double-sided non-slip rug tape.  Do not have throw rugs and other  things on the floor that can make you trip. What can I do in the bedroom?  Use night lights.  Make sure that you have a light by your bed that is easy to reach.  Do not use any sheets or blankets that are too big for your bed. They should not hang down onto the floor.  Have a firm chair that has side arms. You can use this for support while you get dressed.  Do not have throw rugs and other things on the floor that can make you trip. What can I do in the kitchen?  Clean up any spills right away.  Avoid walking on wet floors.  Keep items that you use a lot in easy-to-reach places.  If you need to reach something above you, use a strong step stool that has a grab bar.  Keep electrical cords out of the way.  Do not use floor polish or wax that makes floors slippery. If you must use wax, use non-skid floor wax.  Do not have throw rugs and other things on the floor that can make you trip. What can I do with my stairs?  Do not leave  any items on the stairs.  Make sure that there are handrails on both sides of the stairs and use them. Fix handrails that are broken or loose. Make sure that handrails are as long as the stairways.  Check any carpeting to make sure that it is firmly attached to the stairs. Fix any carpet that is loose or worn.  Avoid having throw rugs at the top or bottom of the stairs. If you do have throw rugs, attach them to the floor with carpet tape.  Make sure that you have a light switch at the top of the stairs and the bottom of the stairs. If you do not have them, ask someone to add them for you. What else can I do to help prevent falls?  Wear shoes that:  Do not have high heels.  Have rubber bottoms.  Are comfortable and fit you well.  Are closed at the toe. Do not wear sandals.  If you use a stepladder:  Make sure that it is fully opened. Do not climb a closed stepladder.  Make sure that both sides of the stepladder are locked into place.  Ask  someone to hold it for you, if possible.  Clearly mark and make sure that you can see:  Any grab bars or handrails.  First and last steps.  Where the edge of each step is.  Use tools that help you move around (mobility aids) if they are needed. These include:  Canes.  Walkers.  Scooters.  Crutches.  Turn on the lights when you go into a dark area. Replace any light bulbs as soon as they burn out.  Set up your furniture so you have a clear path. Avoid moving your furniture around.  If any of your floors are uneven, fix them.  If there are any pets around you, be aware of where they are.  Review your medicines with your doctor. Some medicines can make you feel dizzy. This can increase your chance of falling. Ask your doctor what other things that you can do to help prevent falls. This information is not intended to replace advice given to you by your health care provider. Make sure you discuss any questions you have with your health care provider. Document Released: 06/16/2009 Document Revised: 01/26/2016 Document Reviewed: 09/24/2014 Elsevier Interactive Patient Education  2017 [employer].    Steps to Quit Smoking Smoking tobacco can be bad for your health. It can also affect almost every organ in your body. Smoking puts you and people around you at risk for many serious long-lasting (chronic) diseases. Quitting smoking is hard, but it is one of the best things that you can do for your health. It is never too late to quit. What are the benefits of quitting smoking? When you quit smoking, you lower your risk for getting serious diseases and conditions. They can include:  Lung cancer or lung disease.  Heart disease.  Stroke.  Heart attack.  Not being able to have children (infertility).  Weak bones (osteoporosis) and broken bones (fractures).  If you have coughing, wheezing, and shortness of breath, those symptoms may get better when you quit. You may also get  sick less often. If you are pregnant, quitting smoking can help to lower your chances of having a baby of low birth weight. What can I do to help me quit smoking? Talk with your doctor about what can help you quit smoking. Some things you can do (strategies) include:  Quitting smoking totally, instead  of slowly cutting back how much you smoke over a period of time.  Going to in-person counseling. You are more likely to quit if you go to many counseling sessions.  Using resources and support systems, such as: ? Database administrator with a Social worker. ? Phone quitlines. ? Careers information officer. ? Support groups or group counseling. ? Text messaging programs. ? Mobile phone apps or applications.  Taking medicines. Some of these medicines may have nicotine in them. If you are pregnant or breastfeeding, do not take any medicines to quit smoking unless your doctor says it is okay. Talk with your doctor about counseling or other things that can help you.  Talk with your doctor about using more than one strategy at the same time, such as taking medicines while you are also going to in-person counseling. This can help make quitting easier. What things can I do to make it easier to quit? Quitting smoking might feel very hard at first, but there is a lot that you can do to make it easier. Take these steps:  Talk to your family and friends. Ask them to support and encourage you.  Call phone quitlines, reach out to support groups, or work with a Social worker.  Ask people who smoke to not smoke around you.  Avoid places that make you want (trigger) to smoke, such as: ? Bars. ? Parties. ? Smoke-break areas at work.  Spend time with people who do not smoke.  Lower the stress in your life. Stress can make you want to smoke. Try these things to help your stress: ? Getting regular exercise. ? Deep-breathing exercises. ? Yoga. ? Meditating. ? Doing a body scan. To do this, close your eyes, focus on one  area of your body at a time from head to toe, and notice which parts of your body are tense. Try to relax the muscles in those areas.  Download or buy apps on your mobile phone or tablet that can help you stick to your quit plan. There are many free apps, such as QuitGuide from the [employer] Office manager for Disease Control and Prevention). You can find more support from smokefree.gov and other websites.  This information is not intended to replace advice given to you by your health care provider. Make sure you discuss any questions you have with your health care provider. Document Released: 06/16/2009 Document Revised: 04/17/2016 Document Reviewed: 01/04/2015 Elsevier Interactive Patient Education  2018 [employer].

## 2017-03-12 NOTE — Progress Notes (Signed)
Subjective:   Ricardo Folks Sr. is a 61 y.o. male who presents for an Initial Medicare Annual Wellness Visit.  Review of Systems   Cardiac Risk Factors include: male gender;advanced age (>45men, >15 women)    Objective:    Today's Vitals   03/12/17 1451  BP: 126/70  Pulse: 74  Temp: 97.7 F (36.5 C)  Weight: 178 lb (80.7 kg)  Height: 6\' 2"  (1.88 m)   Body mass index is 22.85 kg/m.  Current Medications (verified) Outpatient Encounter Prescriptions as of 03/12/2017  Medication Sig  . apixaban (ELIQUIS) 5 MG TABS tablet Take 1 tablet (5 mg total) by mouth 2 (two) times daily.  [name] aspirin EC 81 MG tablet Take 81 mg by mouth daily.  . sildenafil (REVATIO) 20 MG tablet Take 3-5 pills as needed prior to sex. (Patient taking differently: Take 60-100 mg by mouth daily as needed. Sexual activity)  . Ibuprofen (ADVIL) 200 MG CAPS Take 800 mg by mouth every 8 (eight) hours as needed (for pain).   [name] oxyCODONE (OXY IR/ROXICODONE) 5 MG immediate release tablet Take 1-2 tablets (5-10 mg total) by mouth every 4 (four) hours as needed for moderate pain. (Patient not taking: Reported on 03/12/2017)   No facility-administered encounter medications on file as of 03/12/2017.     Allergies (verified) Patient has no known allergies.   History: Past Medical History:  Diagnosis Date  . Arthritis    Rheumatoid  . Bipolar disorder ([place])   . Depression   . Fusion of spine   . Headache   . Hepatitis C    HEP "C"  . History of kidney stones   . Hyperlipidemia   . Pericarditis    20 years ago  . Peripheral vascular disease [facility])    Past Surgical History:  Procedure Laterality Date  . ABDOMINAL AORTOGRAM W/LOWER EXTREMITY N/A 01/15/2017   Procedure: Abdominal Aortogram w/Lower Extremity;  Surgeon: Katha Cabal, MD;  Location: [place] [facility];  Service: Cardiovascular;  Laterality: N/A;  . BACK SURGERY  2004   acdf  . LOWER EXTREMITY ANGIOGRAPHY Right 01/15/2017   Procedure:  Lower Extremity Angiography;  Surgeon: Katha Cabal, MD;  Location: [place] [facility];  Service: Cardiovascular;  Laterality: Right;  . LOWER EXTREMITY ANGIOGRAPHY Right 02/13/2017   Procedure: Lower Extremity Angiography;  Surgeon: Katha Cabal, MD;  Location: [place] [facility];  Service: Cardiovascular;  Laterality: Right;   Family History  Problem Relation Age of Onset  . Cancer Mother        liver  . Heart disease Father   . Diabetes Father   . Prostate cancer Neg Hx   . Bladder Cancer Neg Hx   . Kidney cancer Neg Hx    Social History   Occupational History  . Not on file.   Social History Main Topics  . Smoking status: Current Every Day Smoker    Packs/day: 1.00    Years: 40.00    Types: Cigarettes  . Smokeless tobacco: Never Used  . Alcohol use 1.2 oz/week    2 Shots of liquor per week     Comment: occasional   . Drug use: No     Comment: polysubstance approx 2 years ago  . Sexual activity: Not on file   Tobacco Counseling Ready to quit: Yes Counseling given: Yes   Activities of Daily Living In your present state of health, do you have any difficulty performing the following activities: 03/12/2017 02/13/2017  Hearing? N N  Vision? N N  Difficulty concentrating or making decisions? N N  Walking or climbing stairs? Y N  Dressing or bathing? N N  Doing errands, shopping? N N  Preparing Food and eating ? N -  Using the Toilet? N -  In the past six months, have you accidently leaked urine? N -  Do you have problems with loss of bowel control? N -  Managing your Medications? N -  Managing your Finances? N -  Housekeeping or managing your Housekeeping? N -  Some recent data might be hidden    Immunizations and Health Maintenance  There is no immunization history on file for this patient. There are no preventive care reminders to display for this patient.  Patient Care Team: Mikey College, NP as PCP - General (Nurse  Practitioner)  Indicate any recent Medical Services you may have received from other than [facility] providers in the past year (date may be approximate).    Assessment:   This is a routine wellness examination for Ricardo Cervantes.   Hearing/Vision screen Vision Screening Comments: Hasnt seen eye dr recently  Dietary issues and exercise activities discussed: Current Exercise Habits: The patient does not participate in regular exercise at present, Exercise limited by: Other - see comments (has blood clot in right leg)  Goals    . Quit smoking / using tobacco          Smoking cessation discussed      Depression Screen PHQ 2/9 Scores 03/12/2017 12/23/2015  PHQ - 2 Score 0 2  PHQ- 9 Score - 4    Fall Risk Fall Risk  03/12/2017 12/23/2015 12/23/2015  Falls in the past year? No No No    Cognitive Function:     6CIT Screen 03/12/2017  What Year? 0 points  What month? 0 points  What time? 0 points  Count back from 20 0 points  Months in reverse 0 points  Repeat phrase 0 points  Total Score 0    Screening Tests Health Maintenance  Topic Date Due  . INFLUENZA VACCINE  05/04/2017 (Originally 04/03/2017)  . COLONOSCOPY  09/03/2017 (Originally 10/24/2007)  . TETANUS/TDAP  11/27/2026 (Originally 10/23/1976)  . Hepatitis C Screening  Completed  . HIV Screening  Completed        Plan:    I have personally reviewed and addressed the Medicare Annual Wellness questionnaire and have noted the following in the patient's chart:  A. Medical and social history B. Use of alcohol, tobacco or illicit drugs  C. Current medications and supplements D. Functional ability and status E.  Nutritional status F.  Physical activity G. Advance directives H. List of other physicians I.  Hospitalizations, surgeries, and ER visits in previous 12 months J.  [place] such as hearing and vision if needed, cognitive and depression L. Referrals and appointments  In addition, I have reviewed and discussed  with patient certain preventive protocols, quality metrics, and best practice recommendations. A written personalized care plan for preventive services as well as general preventive health recommendations were provided to patient.   Signed,  Tyler Aas, LPN Nurse Health Advisor   MD Recommendations: none

## 2017-03-31 NOTE — Progress Notes (Signed)
MRN : [id number]  Ricardo TORTORELLA Sr. is a 61 y.o. ([birth year]) male who presents with chief complaint of leg pain.  History of Present Illness: The patient returns to the office for followup and review status post angiogram with intervention. This was a follow-up angiography after successful revascularization of his SFA in May and subsequent rapid rethrombosis. The second angiogram was performed 02/13/2017 and the procedure as listed below.  Procedure(s) Performed: 1. Introduction catheter into right lower extremity 3rd order catheter placement  2. Contrast injection right lower extremity for distal runoff  3. Percutaneous transluminal angioplasty and stent placement right superficial femoral artery to 7 mm with a 7 x 30 mm life stent at the origin of the SFA 4. Percutaneous transluminal angioplasty right peroneal to 3 mm  5. Mechanical thrombectomy right SFA popliteal and peroneal using the penumbra 6 catheter with 10 mg of TPA.             6.  Star close closure left common femoral arteriotomy  The patient notes that his leg felt great for approximately 1 week and then the sudden onset of severe pain returned. His claudication distance is now back to lifestyle limitations he notes his vacation in [place] was "miserable"   There have been no significant changes to the patient's overall health care.  The patient denies amaurosis fugax or recent TIA symptoms. There are no recent neurological changes noted. The patient denies history of DVT, PE or superficial thrombophlebitis. The patient denies recent episodes of angina or shortness of breath.   ABI's Rt= 0.55 and Lt= 1.05     No outpatient prescriptions have been marked as taking for the 04/01/17 encounter (Appointment) with Delana Meyer, Dolores Lory, MD.    Past Medical History:  Diagnosis Date  . Arthritis    Rheumatoid  . Bipolar disorder ([place])   . Depression   .  Fusion of spine   . Headache   . Hepatitis C    HEP "C"  . History of kidney stones   . Hyperlipidemia   . Pericarditis    20 years ago  . Peripheral vascular disease [facility])     Past Surgical History:  Procedure Laterality Date  . ABDOMINAL AORTOGRAM W/LOWER EXTREMITY N/A 01/15/2017   Procedure: Abdominal Aortogram w/Lower Extremity;  Surgeon: Katha Cabal, MD;  Location: [place] [facility];  Service: Cardiovascular;  Laterality: N/A;  . BACK SURGERY  2004   acdf  . LOWER EXTREMITY ANGIOGRAPHY Right 01/15/2017   Procedure: Lower Extremity Angiography;  Surgeon: Katha Cabal, MD;  Location: [place] [facility];  Service: Cardiovascular;  Laterality: Right;  . LOWER EXTREMITY ANGIOGRAPHY Right 02/13/2017   Procedure: Lower Extremity Angiography;  Surgeon: Katha Cabal, MD;  Location: [place] [facility];  Service: Cardiovascular;  Laterality: Right;    Social History Social History  Substance Use Topics  . Smoking status: Current Every Day Smoker    Packs/day: 1.00    Years: 40.00    Types: Cigarettes  . Smokeless tobacco: Never Used  . Alcohol use 1.2 oz/week    2 Shots of liquor per week     Comment: occasional     Family History Family History  Problem Relation Age of Onset  . Cancer Mother        liver  . Heart disease Father   . Diabetes Father   . Prostate cancer Neg Hx   . Bladder Cancer Neg Hx   . Kidney cancer  Neg Hx     No Known Allergies   REVIEW OF SYSTEMS (Negative unless checked)  Constitutional: [] Weight loss  [] Fever  [] Chills Cardiac: [] Chest pain   [] Chest pressure   [] Palpitations   [] Shortness of breath when laying flat   [] Shortness of breath with exertion. Vascular:  [x] Pain in legs with walking   [x] Pain in legs at rest  [] History of DVT   [] Phlebitis   [] Swelling in legs   [] Varicose veins   [] Non-healing ulcers Pulmonary:   [] Uses home oxygen   [] Productive cough   [] Hemoptysis   [] Wheeze  [] COPD   [] Asthma Neurologic:   [] Dizziness   [] Seizures   [] History of stroke   [] History of TIA  [] Aphasia   [] Vissual changes   [] Weakness or numbness in arm   [] Weakness or numbness in leg Musculoskeletal:   [] Joint swelling   [] Joint pain   [] Low back pain Hematologic:  [] Easy bruising  [] Easy bleeding   [] Hypercoagulable state   [] Anemic Gastrointestinal:  [] Diarrhea   [] Vomiting  [] Gastroesophageal reflux/heartburn   [] Difficulty swallowing. Genitourinary:  [] Chronic kidney disease   [] Difficult urination  [] Frequent urination   [] Blood in urine Skin:  [] Rashes   [] Ulcers  Psychological:  [] History of anxiety   []  History of major depression.  Physical Examination  There were no vitals filed for this visit. There is no height or weight on file to calculate BMI. Gen: WD/WN, NAD Head: [place]/AT, No temporalis wasting.  Ear/Nose/Throat: Hearing grossly intact, nares w/o erythema or drainage Eyes: PER, EOMI, sclera nonicteric.  Neck: Supple, no large masses.   Pulmonary:  Good air movement, no audible wheezing bilaterally, no use of accessory muscles.  Cardiac: RRR, no JVD Vascular: Right foot cool to the touch with sluggish capillary refill and mild cyanosis of the toes Vessel Right Left  Popliteal Not Palpable Palpable  PT Not Palpable Palpable  DP Not Palpable Palpable  Gastrointestinal: Non-distended. No guarding/no peritoneal signs.  Musculoskeletal: M/S 5/5 throughout.  No deformity or atrophy.  Neurologic: CN 2-12 intact. Symmetrical.  Speech is fluent. Motor exam as listed above. Psychiatric: Judgment intact, Mood & affect appropriate for pt's clinical situation. Dermatologic: No rashes or ulcers noted.  No changes consistent with cellulitis. Lymph : No lichenification or skin changes of chronic lymphedema.  CBC Lab Results  Component Value Date   WBC 5.5 02/14/2017   HGB 12.6 (L) 02/14/2017   HCT 36.9 (L) 02/14/2017   MCV 94.8 02/14/2017   PLT 115 (L) 02/14/2017    BMET    Component Value  Date/Time   NA 134 (L) 02/14/2017 0354   NA 139 12/23/2015 1606   K 4.0 02/14/2017 0354   CL 104 02/14/2017 0354   CO2 27 02/14/2017 0354   GLUCOSE 102 (H) 02/14/2017 0354   BUN 22 (H) 02/14/2017 0354   BUN 15 12/23/2015 1606   CREATININE 0.93 02/14/2017 0354   CALCIUM 8.2 (L) 02/14/2017 0354   GFRNONAA >60 02/14/2017 0354   GFRAA >60 02/14/2017 0354   CrCl cannot be calculated (Patient's most recent lab result is older than the maximum 21 days allowed.).  COAG No results found for: INR, PROTIME  Radiology No results found.  Assessment/Plan 1. Atherosclerosis of native artery of right lower extremity with rest pain [facility]) The patient has had 2 successful angiograms with recanalization of this occluded SFA. In both instances he had a palpable pedal pulse post procedure. Likewise, in both instances he rethrombosed within 10 days. Given the review of the  images I see no technical abnormality the fact that he had an easily palpable pulse at his pedal level also suggests that there was no residual technical abnormality. Therefore I believe we are dealing with a significant hypercoagulable state. I will ask the hematologists to evaluate him before we move forward with any further revascularizations. I've also discussed with the patient the possibility that surgery with a femoral to below-knee bypass using vein only may be the best option.   A total of 35 minutes was spent with this patient and greater than 50% was spent in counseling and coordination of care with the patient.  Discussion included the treatment options for vascular disease including indications for surgery and intervention.  Also discussed is the appropriate timing of treatment.  In addition medical therapy was discussed.  2. Pain of right lower extremity Compensated ischemic leg see  #1  3. Primary hypercoagulable state ([place]) I communicated with the cancer center. We'll arrange for evaluation and I will see him back in 1  month. - Ambulatory referral to Hematology    [name], MD  03/31/2017 4:41 PM

## 2017-04-01 ENCOUNTER — Other Ambulatory Visit (INDEPENDENT_AMBULATORY_CARE_PROVIDER_SITE_OTHER): Payer: Medicare Other

## 2017-04-01 ENCOUNTER — Ambulatory Visit (INDEPENDENT_AMBULATORY_CARE_PROVIDER_SITE_OTHER): Payer: Medicare Other | Admitting: Vascular Surgery

## 2017-04-01 ENCOUNTER — Other Ambulatory Visit (INDEPENDENT_AMBULATORY_CARE_PROVIDER_SITE_OTHER): Payer: Self-pay | Admitting: Vascular Surgery

## 2017-04-01 ENCOUNTER — Encounter (INDEPENDENT_AMBULATORY_CARE_PROVIDER_SITE_OTHER): Payer: Self-pay | Admitting: Vascular Surgery

## 2017-04-01 VITALS — BP 137/88 | HR 76 | Resp 16 | Ht 74.0 in | Wt 182.0 lb

## 2017-04-01 DIAGNOSIS — D6859 Other primary thrombophilia: Secondary | ICD-10-CM

## 2017-04-01 DIAGNOSIS — M79604 Pain in right leg: Secondary | ICD-10-CM

## 2017-04-01 DIAGNOSIS — I739 Peripheral vascular disease, unspecified: Secondary | ICD-10-CM

## 2017-04-01 DIAGNOSIS — I70221 Atherosclerosis of native arteries of extremities with rest pain, right leg: Secondary | ICD-10-CM | POA: Diagnosis not present

## 2017-04-01 DIAGNOSIS — Z95820 Peripheral vascular angioplasty status with implants and grafts: Secondary | ICD-10-CM

## 2017-04-01 MED ORDER — OXYCODONE HCL 10 MG PO TABS: 10.0000 mg | ORAL_TABLET | Freq: Three times a day (TID) | ORAL | 0 refills | Status: DC

## 2017-04-08 ENCOUNTER — Other Ambulatory Visit: Payer: Self-pay

## 2017-04-08 DIAGNOSIS — N529 Male erectile dysfunction, unspecified: Secondary | ICD-10-CM

## 2017-04-08 MED ORDER — SILDENAFIL CITRATE 20 MG PO TABS: ORAL_TABLET | ORAL | 1 refills | Status: DC

## 2017-04-09 ENCOUNTER — Inpatient Hospital Stay: Payer: Medicare Other

## 2017-04-09 ENCOUNTER — Encounter: Payer: Self-pay | Admitting: Oncology

## 2017-04-09 ENCOUNTER — Inpatient Hospital Stay: Payer: Medicare Other | Attending: Oncology | Admitting: Oncology

## 2017-04-09 DIAGNOSIS — Z87442 Personal history of urinary calculi: Secondary | ICD-10-CM | POA: Diagnosis not present

## 2017-04-09 DIAGNOSIS — D649 Anemia, unspecified: Secondary | ICD-10-CM | POA: Diagnosis not present

## 2017-04-09 DIAGNOSIS — Z7982 Long term (current) use of aspirin: Secondary | ICD-10-CM | POA: Insufficient documentation

## 2017-04-09 DIAGNOSIS — F319 Bipolar disorder, unspecified: Secondary | ICD-10-CM | POA: Insufficient documentation

## 2017-04-09 DIAGNOSIS — Z7901 Long term (current) use of anticoagulants: Secondary | ICD-10-CM

## 2017-04-09 DIAGNOSIS — B1920 Unspecified viral hepatitis C without hepatic coma: Secondary | ICD-10-CM | POA: Insufficient documentation

## 2017-04-09 DIAGNOSIS — F17210 Nicotine dependence, cigarettes, uncomplicated: Secondary | ICD-10-CM | POA: Insufficient documentation

## 2017-04-09 DIAGNOSIS — D6859 Other primary thrombophilia: Secondary | ICD-10-CM

## 2017-04-09 DIAGNOSIS — I749 Embolism and thrombosis of unspecified artery: Secondary | ICD-10-CM

## 2017-04-09 DIAGNOSIS — Z716 Tobacco abuse counseling: Secondary | ICD-10-CM

## 2017-04-09 DIAGNOSIS — I70211 Atherosclerosis of native arteries of extremities with intermittent claudication, right leg: Secondary | ICD-10-CM | POA: Diagnosis not present

## 2017-04-09 DIAGNOSIS — I739 Peripheral vascular disease, unspecified: Secondary | ICD-10-CM

## 2017-04-09 DIAGNOSIS — E785 Hyperlipidemia, unspecified: Secondary | ICD-10-CM | POA: Insufficient documentation

## 2017-04-09 DIAGNOSIS — Z981 Arthrodesis status: Secondary | ICD-10-CM | POA: Insufficient documentation

## 2017-04-09 DIAGNOSIS — D6851 Activated protein C resistance: Secondary | ICD-10-CM | POA: Diagnosis not present

## 2017-04-09 LAB — CBC WITH DIFFERENTIAL/PLATELET
BASOS ABS: 0 10*3/uL (ref 0–0.1)
BASOS PCT: 1 %
EOS ABS: 0.1 10*3/uL (ref 0–0.7)
Eosinophils Relative: 1 %
HEMATOCRIT: 45.9 % (ref 40.0–52.0)
HEMOGLOBIN: 15.9 g/dL (ref 13.0–18.0)
Lymphocytes Relative: 21 %
Lymphs Abs: 1.2 10*3/uL (ref 1.0–3.6)
MCH: 32.9 pg (ref 26.0–34.0)
MCHC: 34.7 g/dL (ref 32.0–36.0)
MCV: 94.8 fL (ref 80.0–100.0)
Monocytes Absolute: 0.3 10*3/uL (ref 0.2–1.0)
Monocytes Relative: 6 %
NEUTROS ABS: 4 10*3/uL (ref 1.4–6.5)
Neutrophils Relative %: 71 %
Platelets: 185 10*3/uL (ref 150–440)
RBC: 4.85 MIL/uL (ref 4.40–5.90)
RDW: 14.2 % (ref 11.5–14.5)
WBC: 5.6 10*3/uL (ref 3.8–10.6)

## 2017-04-09 LAB — PLATELET FUNCTION ASSAY: COLLAGEN / EPINEPHRINE: 81 s (ref 0–193)

## 2017-04-09 LAB — VITAMIN B12: VITAMIN B 12: 362 pg/mL (ref 180–914)

## 2017-04-09 LAB — COMPREHENSIVE METABOLIC PANEL
ALBUMIN: 4.7 g/dL (ref 3.5–5.0)
ALK PHOS: 70 U/L (ref 38–126)
ALT: 15 U/L — AB (ref 17–63)
AST: 19 U/L (ref 15–41)
Anion gap: 11 (ref 5–15)
BILIRUBIN TOTAL: 0.7 mg/dL (ref 0.3–1.2)
BUN: 21 mg/dL — AB (ref 6–20)
CALCIUM: 9.4 mg/dL (ref 8.9–10.3)
CO2: 26 mmol/L (ref 22–32)
CREATININE: 0.93 mg/dL (ref 0.61–1.24)
Chloride: 94 mmol/L — ABNORMAL LOW (ref 101–111)
GFR calc Af Amer: >60 mL/min (ref >60)
GFR calc non Af Amer: >60 mL/min (ref >60)
GLUCOSE: 110 mg/dL — AB (ref 65–99)
Potassium: 3.9 mmol/L (ref 3.5–5.1)
SODIUM: 131 mmol/L — AB (ref 135–145)
TOTAL PROTEIN: 8.1 g/dL (ref 6.5–8.1)

## 2017-04-09 LAB — FOLATE: Folate: 8.2 ng/mL (ref >5.9)

## 2017-04-09 LAB — LACTATE DEHYDROGENASE: LDH: 152 U/L (ref 98–192)

## 2017-04-09 NOTE — Progress Notes (Signed)
Patient here today as a new patient  

## 2017-04-09 NOTE — Progress Notes (Signed)
[facility] Initial Visit:  Patient Care Team: Mikey College, NP as PCP - General (Nurse Practitioner)  CHIEF COMPLAINTS/PURPOSE OF CONSULTATION: My surgeon asked me to see you  HISTORY OF PRESENTING ILLNESS: Ricardo Folks Sr. 61 y.o. male with past medical history as below is here for evaluation of hypercoagulable state work up.patient has peripheral vascular disease of right lower extremity with leg pain. He follows up with vascular surgeon Dr. Delana Meyer. He is status post right leg atherectomy with PTA and stent on 5/156/2018. Patient reports that hardly improved after his procedure. On 02/14/2017, angiogram showed moderate severe stenosis. Vascular surgeon did percutaneous transluminal angioplasty and stent placement. Dr. Delana Meyer felt that patient has had2 successful with recannulization of this concluded SFA, in both instances, he had a palpable pulse post procedure. Likewise, he rethrombosis within 10 days. Suspecting that he has hypercoagulable state and was referred to me for further workup.  Patient currently is on Eliquis 5 mg twice a day, aspirin 81 mg. He reports lower extremity pain with walking, and his symptoms have not improved much since his stent placement. He is active current smoker, a pack a day for many years. He denies any history of previous vein clots, bleeding events, weight loss, fever/chills, night sweats,chest pain, abdominal pain, shortness of breath. Patient feels at his baseline health  Review of Systems  Constitutional: Negative.   HENT:  Negative.   Eyes: Negative.   Respiratory: Negative.   Cardiovascular: Negative.   Gastrointestinal: Negative.   Endocrine: Negative.   Genitourinary: Negative.    Musculoskeletal:       Lower extremity pain.   Skin: Negative.   Neurological: Negative.   Hematological: Negative.   Psychiatric/Behavioral: Negative.     MEDICAL HISTORY: Past Medical History:  Diagnosis Date  . Arthritis     Rheumatoid  . Bipolar disorder ([place])   . Depression   . Fusion of spine   . Headache   . Hepatitis C    HEP "C"  . History of kidney stones   . Hyperlipidemia   . Pericarditis    20 years ago  . Peripheral vascular disease ([place])     SURGICAL HISTORY: Past Surgical History:  Procedure Laterality Date  . ABDOMINAL AORTOGRAM W/LOWER EXTREMITY N/A 01/15/2017   Procedure: Abdominal Aortogram w/Lower Extremity;  Surgeon: Katha Cabal, MD;  Location: [place] [facility];  Service: Cardiovascular;  Laterality: N/A;  . BACK SURGERY  2004   acdf  . LOWER EXTREMITY ANGIOGRAPHY Right 01/15/2017   Procedure: Lower Extremity Angiography;  Surgeon: Katha Cabal, MD;  Location: [place] [facility];  Service: Cardiovascular;  Laterality: Right;  . LOWER EXTREMITY ANGIOGRAPHY Right 02/13/2017   Procedure: Lower Extremity Angiography;  Surgeon: Katha Cabal, MD;  Location: [place] [facility];  Service: Cardiovascular;  Laterality: Right;    SOCIAL HISTORY: Social History   Social History  . Marital status: Married    Spouse name: N/A  . Number of children: N/A  . Years of education: N/A   Occupational History  . Not on file.   Social History Main Topics  . Smoking status: Current Every Day Smoker    Packs/day: 1.00    Years: 40.00    Types: Cigarettes  . Smokeless tobacco: Never Used  . Alcohol use 1.2 oz/week    2 Shots of liquor per week     Comment: occasional (every 3-4 weeks)   . Drug use: No  Comment: polysubstance approx 2 years ago  . Sexual activity: Not Currently   Other Topics Concern  . Not on file   Social History Narrative  . No narrative on file    FAMILY HISTORY Family History  Problem Relation Age of Onset  . Cancer Mother        liver  . Heart disease Father   . Diabetes Father   . Breast cancer Sister   . Brain cancer Sister   . Prostate cancer Neg Hx   . Bladder Cancer Neg Hx   . Kidney cancer Neg Hx     ALLERGIES:  has  No Known Allergies.  MEDICATIONS:  Current Outpatient Prescriptions  Medication Sig Dispense Refill  . apixaban (ELIQUIS) 5 MG TABS tablet Take 1 tablet (5 mg total) by mouth 2 (two) times daily. 60 tablet 5  . aspirin EC 81 MG tablet Take 81 mg by mouth daily.    . Ibuprofen (ADVIL) 200 MG CAPS Take 800 mg by mouth every 8 (eight) hours as needed (for pain).     . Oxycodone HCl 10 MG TABS Take 1 tablet (10 mg total) by mouth every 8 (eight) hours. 50 tablet 0  . sildenafil (REVATIO) 20 MG tablet Take 3-5 pills as needed prior to sex. 30 tablet 1   No current facility-administered medications for this visit.     PHYSICAL EXAMINATION:  ECOG PERFORMANCE STATUS: 0 - Asymptomatic   There were no vitals filed for this visit.  There were no vitals filed for this visit.   Physical Exam GENERAL: No distress, well nourished.  SKIN:  No rashes or significant lesions  HEAD: Normocephalic, No masses, lesions, tenderness or abnormalities  EYES: Conjunctiva are pink, non icteric ENT: External ears normal ,lips , buccal mucosa, and tongue normal and mucous membranes are moist  LYMPH: No palpable cervical and axillary lymphadenopathy  LUNGS: Clear to auscultation, no crackles or wheezes HEART: Regular rate & rhythm, no murmurs, no gallops, S1 normal and S2 normal  ABDOMEN: Abdomen soft, non-tender, normal bowel sounds, I did not appreciate any  masses or organomegaly  MUSCULOSKELETAL: No CVA tenderness and no tenderness on percussion of the back or rib cage.  EXTREMITIES: No edema, no skin discoloration or tenderness NEURO: Alert & oriented, no focal motor/sensory deficits.    LABORATORY DATA: I have personally reviewed the data as listed:  Appointment on 04/09/2017  Component Date Value Ref Range Status  . Folate 04/09/2017 8.2  >5.9 ng/mL Final  . WBC 04/09/2017 5.6  3.8 - 10.6 K/uL Final  . RBC 04/09/2017 4.85  4.40 - 5.90 MIL/uL Final  . Hemoglobin 04/09/2017 15.9  13.0 - 18.0  g/dL Final  . HCT 04/09/2017 45.9  40.0 - 52.0 % Final  . MCV 04/09/2017 94.8  80.0 - 100.0 fL Final  . MCH 04/09/2017 32.9  26.0 - 34.0 pg Final  . MCHC 04/09/2017 34.7  32.0 - 36.0 g/dL Final  . RDW 04/09/2017 14.2  11.5 - 14.5 % Final  . Platelets 04/09/2017 185  150 - 440 K/uL Final  . Neutrophils Relative % 04/09/2017 71  % Final  . Neutro Abs 04/09/2017 4.0  1.4 - 6.5 K/uL Final  . Lymphocytes Relative 04/09/2017 21  % Final  . Lymphs Abs 04/09/2017 1.2  1.0 - 3.6 K/uL Final  . Monocytes Relative 04/09/2017 6  % Final  . Monocytes Absolute 04/09/2017 0.3  0.2 - 1.0 K/uL Final  . Eosinophils Relative 04/09/2017 1  %  Final  . Eosinophils Absolute 04/09/2017 0.1  0 - 0.7 K/uL Final  . Basophils Relative 04/09/2017 1  % Final  . Basophils Absolute 04/09/2017 0.0  0 - 0.1 K/uL Final  . Sodium 04/09/2017 131* 135 - 145 mmol/L Final  . Potassium 04/09/2017 3.9  3.5 - 5.1 mmol/L Final  . Chloride 04/09/2017 94* 101 - 111 mmol/L Final  . CO2 04/09/2017 26  22 - 32 mmol/L Final  . Glucose, Bld 04/09/2017 110* 65 - 99 mg/dL Final  . BUN 04/09/2017 21* 6 - 20 mg/dL Final  . Creatinine, Ser 04/09/2017 0.93  0.61 - 1.24 mg/dL Final  . Calcium 04/09/2017 9.4  8.9 - 10.3 mg/dL Final  . Total Protein 04/09/2017 8.1  6.5 - 8.1 g/dL Final  . Albumin 04/09/2017 4.7  3.5 - 5.0 g/dL Final  . AST 04/09/2017 19  15 - 41 U/L Final  . ALT 04/09/2017 15* 17 - 63 U/L Final  . Alkaline Phosphatase 04/09/2017 70  38 - 126 U/L Final  . Total Bilirubin 04/09/2017 0.7  0.3 - 1.2 mg/dL Final  . GFR calc non Af Amer 04/09/2017 >60  >60 mL/min Final  . GFR calc Af Amer 04/09/2017 >60  >60 mL/min Final   Comment: (NOTE) The eGFR has been calculated using the CKD EPI equation. This calculation has not been validated in all clinical situations. eGFR's persistently <60 mL/min signify possible Chronic Kidney Disease.   . Anion gap 04/09/2017 11  5 - 15 Final  . LDH 04/09/2017 152  98 - 192 U/L Final     RADIOGRAPHIC STUDIES: I have personally reviewed the radiological images as listed and agree with the findings in the report  No results found.  ASSESSMENT/PLAN 1. Tobacco abuse counseling   2. Atherosclerosis of native artery of right lower extremity with intermittent claudication ([place])   3. Arterial thrombosis ([place])   4. Current use of long term anticoagulation   5. Primary hypercoagulable state ([place])   6. Anemia, unspecified type    I discussed with patient that his current arterial thrombosis most likely due to severe peripheral vascular disease/atherosclerotic changes. Having congenital condition of hyper coagulable state is unlikely.I will send hyper coagulable workup including factor V Leiden mutation, prothrombin mutation, antiphospholipid syndrome antibody panel.he is on Eliquis which may interfere with some of the hypercoagulable workup antibodies. We'll also check homocystine, B12 folate levels.  High homocysteine levels related to increased risk of atherosclerotic thrombosis of arteries however it is uncertain thatcorrecting the level supplementation of folate reduce the risk.   The patient has not had age-appropriate cancer screening including colonoscopy. He also fits criteria for lung cancer screening. I offered to prescribe in the CAT scan prescription as well as referring him to GI physician for colonoscopy. I explained to him that cancer can sometimes causes hyper coagulable state. Patient is not interested and declined. He will follow up in 2 weeks to discuss about that test\ Smoking cessation was also discussed in detail. Patient currently is not interested.   Orders Placed This Encounter  Procedures  . Factor 5 leiden    Standing Status:   Future    Number of Occurrences:   1    Standing Expiration Date:   04/09/2018  . Prothrombin gene mutation    Standing Status:   Future    Number of Occurrences:   1    Standing Expiration Date:   04/09/2018  . ANA, IFA (with  reflex)    Standing Status:  Future    Number of Occurrences:   1    Standing Expiration Date:   04/09/2018  . Platelet function assay    Standing Status:   Future    Number of Occurrences:   1    Standing Expiration Date:   04/09/2018  . ANTIPHOSPHOLIPID SYNDROME PROF    Standing Status:   Future    Number of Occurrences:   1    Standing Expiration Date:   04/09/2018  . Homocysteine    Standing Status:   Future    Number of Occurrences:   1    Standing Expiration Date:   04/09/2018  . Vitamin B12    Standing Status:   Future    Number of Occurrences:   1    Standing Expiration Date:   04/09/2018  . Folate    Standing Status:   Future    Number of Occurrences:   1    Standing Expiration Date:   04/09/2018  . CBC with Differential/Platelet    Standing Status:   Future    Number of Occurrences:   1    Standing Expiration Date:   04/09/2018  . Comprehensive metabolic panel    Standing Status:   Future    Number of Occurrences:   1    Standing Expiration Date:   04/09/2018  . PNH Profile (-High Sensitivity)    Standing Status:   Future    Number of Occurrences:   1    Standing Expiration Date:   04/09/2018  . Lactate dehydrogenase    Standing Status:   Future    Number of Occurrences:   1    Standing Expiration Date:   04/09/2018    All questions were answered. The patient knows to call the clinic with any problems, questions or concerns. Thank you for this kind referral and the opportunity to participate in the care of this patient. A copy of today's note is routed to referring provider Dr. Delana Meyer.  Total face to face encounter time for this patient visit was 45 min. >50% of the time was  spent in counseling and coordination of care.    Earlie Server, MD  04/09/2017 1:39 PM

## 2017-04-10 LAB — HOMOCYSTEINE: HOMOCYSTEINE-NORM: 16.2 umol/L — AB (ref 0.0–15.0)

## 2017-04-10 LAB — ANTIPHOSPHOLIPID SYNDROME PROF
DRVVT: 41.6 s (ref 0.0–47.0)
PTT Lupus Anticoagulant: 34.5 s (ref 0.0–51.9)

## 2017-04-10 LAB — ANTINUCLEAR ANTIBODIES, IFA: ANA Ab, IFA: NEGATIVE

## 2017-04-15 LAB — FACTOR 5 LEIDEN

## 2017-04-15 LAB — PROTHROMBIN GENE MUTATION

## 2017-04-16 LAB — PNH PROFILE (-HIGH SENSITIVITY): Viability:: 98

## 2017-04-23 ENCOUNTER — Encounter: Payer: Self-pay | Admitting: Oncology

## 2017-04-23 ENCOUNTER — Inpatient Hospital Stay (HOSPITAL_BASED_OUTPATIENT_CLINIC_OR_DEPARTMENT_OTHER): Payer: Medicare Other | Admitting: Oncology

## 2017-04-23 VITALS — BP 144/90 | HR 94 | Temp 95.2°F | Resp 18 | Wt 182.2 lb

## 2017-04-23 DIAGNOSIS — D6851 Activated protein C resistance: Secondary | ICD-10-CM

## 2017-04-23 DIAGNOSIS — Z7901 Long term (current) use of anticoagulants: Secondary | ICD-10-CM | POA: Diagnosis not present

## 2017-04-23 DIAGNOSIS — F17210 Nicotine dependence, cigarettes, uncomplicated: Secondary | ICD-10-CM | POA: Diagnosis not present

## 2017-04-23 DIAGNOSIS — I749 Embolism and thrombosis of unspecified artery: Secondary | ICD-10-CM

## 2017-04-23 DIAGNOSIS — D649 Anemia, unspecified: Secondary | ICD-10-CM | POA: Diagnosis not present

## 2017-04-23 DIAGNOSIS — I70211 Atherosclerosis of native arteries of extremities with intermittent claudication, right leg: Secondary | ICD-10-CM | POA: Diagnosis not present

## 2017-04-23 DIAGNOSIS — Z79899 Other long term (current) drug therapy: Secondary | ICD-10-CM

## 2017-04-23 DIAGNOSIS — Z716 Tobacco abuse counseling: Secondary | ICD-10-CM

## 2017-04-23 MED ORDER — CYANOCOBALAMIN 500 MCG PO TABS: 500.0000 ug | ORAL_TABLET | Freq: Every day | ORAL | 3 refills | Status: DC

## 2017-04-23 MED ORDER — FOLIC ACID 1 MG PO TABS: 1.0000 mg | ORAL_TABLET | Freq: Every day | ORAL | 3 refills | Status: DC

## 2017-04-23 MED ORDER — PYRIDOXINE HCL 25 MG PO TABS: 25.0000 mg | ORAL_TABLET | Freq: Every day | ORAL | 3 refills | Status: DC

## 2017-04-23 NOTE — Progress Notes (Signed)
Here for follow up

## 2017-04-23 NOTE — Progress Notes (Signed)
[facility] follow up Visit:  Patient Care Team: Mikey College, NP as PCP - General (Nurse Practitioner)  CHIEF COMPLAINTS/PURPOSE OF CONSULTATION: My surgeon asked me to see you  HISTORY OF PRESENTING ILLNESS: Ricardo Cervantes. 61 y.o. male with past medical history as below is here for evaluation of hypercoagulable state work up.patient has peripheral vascular disease of right lower extremity with leg pain. He follows up with vascular surgeon Dr. Delana Meyer. He is status post right leg atherectomy with PTA and stent on 5/156/2018. Patient reports that hardly improved after his procedure. On 02/14/2017, angiogram showed moderate severe stenosis. Vascular surgeon did percutaneous transluminal angioplasty and stent placement. Dr. Delana Meyer felt that patient has had2 successful with recannulization of this concluded SFA, in both instances, he had a palpable pulse post procedure. Likewise, he rethrombosis within 10 days. Suspecting that he has hypercoagulable state and was referred to me for further workup.  Patient currently is on Eliquis 5 mg twice a day, aspirin 81 mg. He reports lower extremity pain with walking, and his symptoms have not improved much since his stent placement. He is active current smoker, a pack a day for many years. He denies any history of previous vein clots, bleeding events, weight loss, fever/chills, night sweats,chest pain, abdominal pain, shortness of breath. Patient feels at his baseline health  INTERVAL HISTORY Patient presents to discuss about the results. No new complaints.     Review of Systems  Constitutional: Negative.   HENT:  Negative.   Eyes: Negative.   Respiratory: Negative.   Cardiovascular: Negative.   Gastrointestinal: Negative.   Endocrine: Negative.   Genitourinary: Negative.    Musculoskeletal:       Lower extremity pain.   Skin: Negative.   Neurological: Negative.   Hematological: Negative.    Psychiatric/Behavioral: Negative.     MEDICAL HISTORY: Past Medical History:  Diagnosis Date  . Arthritis    Rheumatoid  . Bipolar disorder ([place])   . Depression   . Fusion of spine   . Headache   . Hepatitis C    HEP "C"  . History of kidney stones   . Hyperlipidemia   . Pericarditis    20 years ago  . Peripheral vascular disease ([place])     SURGICAL HISTORY: Past Surgical History:  Procedure Laterality Date  . ABDOMINAL AORTOGRAM W/LOWER EXTREMITY N/A 01/15/2017   Procedure: Abdominal Aortogram w/Lower Extremity;  Surgeon: Katha Cabal, MD;  Location: [facility];  Service: Cardiovascular;  Laterality: N/A;  . BACK SURGERY  2004   acdf  . LOWER EXTREMITY ANGIOGRAPHY Right 01/15/2017   Procedure: Lower Extremity Angiography;  Surgeon: Katha Cabal, MD;  Location: [facility];  Service: Cardiovascular;  Laterality: Right;  . LOWER EXTREMITY ANGIOGRAPHY Right 02/13/2017   Procedure: Lower Extremity Angiography;  Surgeon: Katha Cabal, MD;  Location: [facility];  Service: Cardiovascular;  Laterality: Right;    SOCIAL HISTORY: Social History   Social History  . Marital status: Married    Spouse name: N/A  . Number of children: N/A  . Years of education: N/A   Occupational History  . Not on file.   Social History Main Topics  . Smoking status: Current Every Day Smoker    Packs/day: 1.00    Years: 40.00    Types: Cigarettes  . Smokeless tobacco: Never Used  . Alcohol use 1.2 oz/week    2 Shots of liquor per week  Comment: occasional (every 3-4 weeks)   . Drug use: No     Comment: polysubstance approx 2 years ago  . Sexual activity: Not Currently   Other Topics Concern  . Not on file   Social History Narrative  . No narrative on file    FAMILY HISTORY Family History  Problem Relation Age of Onset  . Cancer Mother        liver  . Heart disease Father   . Diabetes Father   . Breast cancer Sister   . Brain  cancer Sister   . Prostate cancer Neg Hx   . Bladder Cancer Neg Hx   . Kidney cancer Neg Hx     ALLERGIES:  has No Known Allergies.  MEDICATIONS:  Current Outpatient Prescriptions  Medication Sig Dispense Refill  . apixaban (ELIQUIS) 5 MG TABS tablet Take 1 tablet (5 mg total) by mouth 2 (two) times daily. 60 tablet 5  . aspirin EC 81 MG tablet Take 81 mg by mouth daily.    . Ibuprofen (ADVIL) 200 MG CAPS Take 800 mg by mouth every 8 (eight) hours as needed (for pain).     . Oxycodone HCl 10 MG TABS Take 1 tablet (10 mg total) by mouth every 8 (eight) hours. 50 tablet 0  . cyanocobalamin 500 MCG tablet Take 1 tablet (500 mcg total) by mouth daily. 30 tablet 3  . folic acid (FOLVITE) 1 MG tablet Take 1 tablet (1 mg total) by mouth daily. 30 tablet 3  . pyridOXINE (VITAMIN B-6) 25 MG tablet Take 1 tablet (25 mg total) by mouth daily. 30 tablet 3  . sildenafil (REVATIO) 20 MG tablet Take 3-5 pills as needed prior to sex. (Patient not taking: Reported on 04/23/2017) 30 tablet 1   No current facility-administered medications for this visit.     PHYSICAL EXAMINATION:  ECOG PERFORMANCE STATUS: 0 - Asymptomatic  Vitals:   04/23/17 1040  BP: (!) 144/90  Pulse: 94  Resp: 18  Temp: (!) 95.2 F (35.1 C)    Filed Weights   04/23/17 1040  Weight: 182 lb 3.2 oz (82.6 kg)     Physical Exam GENERAL: No distress, well nourished.  SKIN:  No rashes or significant lesions  HEAD: Normocephalic, No masses, lesions, tenderness or abnormalities  EYES: Conjunctiva are pink, non icteric ENT: External ears normal ,lips , buccal mucosa, and tongue normal and mucous membranes are moist  LYMPH: No palpable cervical and axillary lymphadenopathy  LUNGS: Clear to auscultation, no crackles or wheezes HEART: Regular rate & rhythm, no murmurs, no gallops, S1 normal and S2 normal  ABDOMEN: Abdomen soft, non-tender, normal bowel sounds, I did not appreciate any  masses or organomegaly  MUSCULOSKELETAL:  No CVA tenderness and no tenderness on percussion of the back or rib cage.  EXTREMITIES: No edema, no skin discoloration or tenderness. Pedal pulses diminished left. NEURO: Alert & oriented, no focal motor/sensory deficits.    LABORATORY DATA: I have personally reviewed the data as listed:  Appointment on 04/09/2017  Component Date Value Ref Range Status  . Recommendations-F5LEID: 04/09/2017 Comment   Final   Comment: (NOTE) Result:  Negative (no mutation found) Factor V Leiden is a specific mutation (R506Q) in the factor V gene that is associated with an increased risk of venous thrombosis. Factor V Leiden is more resistant to inactivation by activated protein C.  As a result, factor V persists in the circulation leading to a mild hyper- coagulable state.  The Leiden mutation  accounts for 90% - 95% of APC resistance.  Factor V Leiden has been reported in patients with deep vein thrombosis, pulmonary embolus, central retinal vein occlusion, cerebral sinus thrombosis and hepatic vein thrombosis. Other risk factors to be considered in the workup for venous thrombosis include the G20210A mutation in the factor II (prothrombin) gene, protein S and C deficiency, and antithrombin deficiencies. Anticardiolipin antibody and lupus anticoagulant analysis may be appropriate for certain patients, as well as homocysteine levels. Contact your local [facility] for information on how to order additi                          onal testing if desired. **Genetic counselors are available for health care providers to**  discuss results at 1-800-345-GENE [phone]). Methodology: DNA analysis of the Factor V gene was performed by allele-specific PCR. The diagnostic sensitivity and specificity is >99% for both. Molecular-based testing is highly accurate, but as in any laboratory test, diagnostic errors may occur. All test results must be combined with clinical information for the most  accurate interpretation. This test was developed and its performance characteristics determined by [facility]. It has not been cleared or approved by the Food and Drug Administration. References: Voelkerding K (1996).  Clin Lab Med [phone]. Allison Quarry, PhD, [facility] Ruben Reason, PhD, [facility] Annetta Maw, M.S., PhD, [facility] Alfredo Bach, PhD, [facility] Norva Riffle, PhD, [facility] Earlean Polka PhD, [facility] Performed At: [facility] [address] [place], [place] [id number] Nechama Guard MD [id number]                          1   . Recommendations-PTGENE: 04/09/2017 Comment   Final   Comment: (NOTE) NEGATIVE No mutation identified. Comment: A point mutation (G20210A) in the factor II (prothrombin) gene is the second most common cause of inherited thrombophilia. The incidence of this mutation in the [place] Caucasian population is about 2% and in the [place] American population it is approximately 0.5%. This mutation is rare in the [place] and Native American population. Being heterozygous for a prothrombin mutation increases the risk for developing venous thrombosis about 2 to 3 times above the general population risk. Being homozygous for the prothrombin gene mutation increases the relative risk for venous thrombosis further, although it is not yet known how much further the risk is increased. In women heterozygous for the prothrombin gene mutation, the use of estrogen containing oral contraceptives increases the relative risk of venous thrombosis about 16 times and the risk of developing cerebral thrombosis is also significantly increased. In pregnancy the pr                          othrombin gene mutation increases risk for venous thrombosis and may increase risk for stillbirth, placental abruption, pre-eclampsia and fetal growth restriction. If the patient possesses two or more congenital or acquired thrombophilic risk factors, the risk for thrombosis may rise  to more than the sum of the risk ratios for the individual mutations. This assay detects only the prothrombin G20210A mutation and does not measure genetic abnormalities elsewhere in the genome. Other thrombotic risk factors may be pursued through systematic clinical laboratory analysis. These factors include the R506Q (Leiden) mutation in the Factor V gene, plasma homocysteine levels, as well as testing for deficiencies of antithrombin III, protein C and protein S. Genetic Counselors are available for health care providers to  discuss results at 1-800-345-GENE [phone]). Methodology: DNA analysis of the Factor II gene was performed by PCR amplification followed by restriction analysis. The di                          agnostic sensitivity is >99% for both. All the tests must be combined with clinical information for the most accurate interpretation. Molecular-based testing is highly accurate, but as in any laboratory test, diagnostic errors may occur. This test was developed and its performance characteristics determined by [facility]. It has not been cleared or approved by the Food and Drug Administration. Poort Cervantes, et al. Blood. 1996; 34:3735-7897. Varga EA. Circulation. 2004; 847:Q41-Q82. Mervin Hack, et [place]; 19:700-703. Allison Quarry, PhD, [facility] Ruben Reason, PhD, [facility] Annetta Maw, M.S., PhD, [facility] Alfredo Bach, PhD, [facility] Norva Riffle, PhD, [facility] Earlean Polka, PhD, [facility] Performed At: [facility] [address] [place], [place] [id number] Nechama Guard MD LV:[id number]   . ANA Ab, IFA 04/09/2017 Negative   Final   Comment: (NOTE)                                     Negative   <1:80                                     Borderline  1:80                                     Positive   >1:80 Performed At: [facility] [place], [place] [id number] Lindon Romp MD [id number]   . PFA  Interpretation 04/09/2017          Final   Comment: Platelet function is normal. If patient history/physical examination give strong indication of a bleeding disorder repeat testing for confirmation.        Results of the test should always be interpreted in conjunction with the patient's medical history, clinical presentation and medication history. Patients with Hematocrit values <35.0% or Platelet counts <150,000/uL may result in values above the Laboratory established reference range.   . Collagen / Epinephrine 04/09/2017 81  0 - 193 seconds Final   Performed at [facility], [address] [place], [place] [address]  . Anticardiolipin IgG 04/09/2017 <9  0 - 14 GPL U/mL Final   Comment: (NOTE)                          Negative:              <15                          Indeterminate:     15 - 20                          Low-Med Positive: >20 - 80                          High Positive:         >80   . Anticardiolipin IgM 04/09/2017 <9  0 - 12 MPL U/mL Final   Comment: (NOTE)                          Negative:              <13                          Indeterminate:     13 - 20                          Low-Med Positive: >20 - 80                          High Positive:         >80   . PTT Lupus Anticoagulant 04/09/2017 34.5  0.0 - 51.9 sec Final  . DRVVT 04/09/2017 41.6  0.0 - 47.0 sec Final  . Lupus Anticoag Interp 04/09/2017 Comment:   Final   Comment: (NOTE) No lupus anticoagulant was detected. Performed At: [facility] [place], [place] [id number] Lindon Romp MD IH:[id number]   . Homocysteine 04/09/2017 16.2* 0.0 - 15.0 umol/L Final   Comment: (NOTE) Performed At: [facility] [place], [place] [id number] Lindon Romp MD [id number]   . Vitamin B-12 04/09/2017 362  180 - 914 pg/mL Final   Comment: (NOTE) This assay is not validated for testing neonatal or myeloproliferative syndrome specimens for Vitamin  B12 levels. Performed at [facility], [place] [address] [place], [place] [address]   . Folate 04/09/2017 8.2  >5.9 ng/mL Final  . WBC 04/09/2017 5.6  3.8 - 10.6 K/uL Final  . RBC 04/09/2017 4.85  4.40 - 5.90 MIL/uL Final  . Hemoglobin 04/09/2017 15.9  13.0 - 18.0 g/dL Final  . HCT 04/09/2017 45.9  40.0 - 52.0 % Final  . MCV 04/09/2017 94.8  80.0 - 100.0 fL Final  . MCH 04/09/2017 32.9  26.0 - 34.0 pg Final  . MCHC 04/09/2017 34.7  32.0 - 36.0 g/dL Final  . RDW 04/09/2017 14.2  11.5 - 14.5 % Final  . Platelets 04/09/2017 185  150 - 440 K/uL Final  . Neutrophils Relative % 04/09/2017 71  % Final  . Neutro Abs 04/09/2017 4.0  1.4 - 6.5 K/uL Final  . Lymphocytes Relative 04/09/2017 21  % Final  . Lymphs Abs 04/09/2017 1.2  1.0 - 3.6 K/uL Final  . Monocytes Relative 04/09/2017 6  % Final  . Monocytes Absolute 04/09/2017 0.3  0.2 - 1.0 K/uL Final  . Eosinophils Relative 04/09/2017 1  % Final  . Eosinophils Absolute 04/09/2017 0.1  0 - 0.7 K/uL Final  . Basophils Relative 04/09/2017 1  % Final  . Basophils Absolute 04/09/2017 0.0  0 - 0.1 K/uL Final  . Sodium 04/09/2017 131* 135 - 145 mmol/L Final  . Potassium 04/09/2017 3.9  3.5 - 5.1 mmol/L Final  . Chloride 04/09/2017 94* 101 - 111 mmol/L Final  . CO2 04/09/2017 26  22 - 32 mmol/L Final  . Glucose, Bld 04/09/2017 110* 65 - 99 mg/dL Final  . BUN 04/09/2017 21* 6 - 20 mg/dL Final  . Creatinine, Ser 04/09/2017 0.93  0.61 - 1.24 mg/dL Final  . Calcium 04/09/2017 9.4  8.9 - 10.3 mg/dL Final  . Total Protein 04/09/2017 8.1  6.5 - 8.1 g/dL Final  .  Albumin 04/09/2017 4.7  3.5 - 5.0 g/dL Final  . AST 04/09/2017 19  15 - 41 U/L Final  . ALT 04/09/2017 15* 17 - 63 U/L Final  . Alkaline Phosphatase 04/09/2017 70  38 - 126 U/L Final  . Total Bilirubin 04/09/2017 0.7  0.3 - 1.2 mg/dL Final  . GFR calc non Af Amer 04/09/2017 >60  >60 mL/min Final  . GFR calc Af Amer 04/09/2017 >60  >60 mL/min Final   Comment: (NOTE) The eGFR has been  calculated using the CKD EPI equation. This calculation has not been validated in all clinical situations. eGFR's persistently <60 mL/min signify possible Chronic Kidney Disease.   . Anion gap 04/09/2017 11  5 - 15 Final  . Interpretation 04/09/2017 Comment   Final   Comment: (NOTE) Peripheral Blood: No evidence of paroxysmal nocturnal hemoglobinuria (PNH).   . Comment: 04/09/2017 Comment   Final   Comment: (NOTE) At the sensitivity level of this assay (typically 0.01-0.1%), these results do not support a diagnosis of paroxysmal nocturnal hemoglobinuria (PNH). Correlation with all available clinical, laboratory, and morphologic data is recommended. (sensitivity typically 0.01-0.1%; lower limit of detection dependent on number of cells present and events acquired, typically 90,000+ events acquired)   . Specimen: 04/09/2017 Peripheral Blood   Final  . Submitted Dx: 04/09/2017 Comment   Final   Evaluation for paroxysmal nocturnal hemoglobinuria (PNH)  . Viability: 04/09/2017 98%   Final   (7AAD exclusion)  . Cell Population 04/09/2017 Comment   Final                            Population Analysis  . Granulocytes: 04/09/2017 anchor deficiency   Final   Comment: Comment No GPI   . Monocytes: 04/09/2017 anchor deficiency   Final   Comment: Comment No GPI   . Antibodies Performed: 04/09/2017 Comment   Final   CD14, CD15, CD24, CD45, CD64, FLAER  . Comment: 04/09/2017 Comment   Final   Comment: (NOTE) This test was developed and its performance characteristics determined by [facility]. It has not been cleared or approved by the Food and Drug Administration (FDA). The FDA has determined that such clearance or approval is not necessary. Any image(s) that accompany this report is/are a representative image(s) only and should not be used to render a diagnosis.   . Director Review PNH 04/09/2017 Comment   Corrected   Comment: (NOTE) Reviewed By: Dimas Millin, M.D. Performed  At: ;# [facility] [address] [place], [place] [id number] Ebony Hail MD [id number]   . LDH 04/09/2017 152  98 - 192 U/L Final    RADIOGRAPHIC STUDIES: I have personally reviewed the radiological images as listed and agree with the findings in the report No recent images. No results found.  ASSESSMENT/PLAN 1. Atherosclerosis of native artery of right lower extremity with intermittent claudication ([place])   2. Arterial thrombosis (HCC)   3. Current use of long term anticoagulation   4. Tobacco abuse counseling    # I discussed with patient that his current arterial thrombosis most likely due to severe peripheral vascular disease/atherosclerotic changes/Extensive smoking history.. hyper coagulable workup including factor V Leiden mutation, prothrombin mutation, antiphospholipid syndrome antibody panel were negative.. He has slightly elevated homocystine, normal B12 folate levels.  High homocysteine levels related to increased risk of atherosclerotic thrombosis of arteries however it is uncertain thatcorrecting the level supplementation of folate reduce the risk.   #  I advised patient to start taking vitamin supplementation with folic acid, B6 and B1 vitamin. Prescriptions are sent over to his pharmacy. Although it Is not clear whether reducing homocystine level can help risk of thrombosis, it does not hurt to take the vitamin supplementation.  # follow up with vascular surgeon for management of his peripheral vascular disease.   The patien  has not had age-appropriate cancer screening including colonoscopy. He also fits criteria for lung cancer screening. I offered to prescribe in the CAT scan prescription as well as referring him to GI physician for colonoscopy. I explained to him that cancer can sometimes causes hyper coagulable state. Patient is not interested and declined.  Smoking cessation was also discussed in detail. Patient currently is not  interested.  offered him to follow-up with me in 3 months with reevaluation of his homocystine level. Patient feels does not necessary. He will call us if he wanted to follow-up with [place]. As of now no appointment is set up.   No orders of the defined types were placed in this encounter.   All questions were answered. The patient knows to call the clinic with any problems, questions or concerns. Thank you for this kind referral and the opportunity to participate in the care of this patient. A copy of today's note is routed to referring provider Dr. Delana Meyer.  Total face to face encounter time for this patient visit was 25 min. >50% of the time was  spent in counseling and coordination of care.    Earlie Server, MD  04/23/2017 1:49 PM

## 2017-05-09 ENCOUNTER — Encounter (INDEPENDENT_AMBULATORY_CARE_PROVIDER_SITE_OTHER): Payer: Self-pay | Admitting: Vascular Surgery

## 2017-05-09 ENCOUNTER — Ambulatory Visit (INDEPENDENT_AMBULATORY_CARE_PROVIDER_SITE_OTHER): Payer: Medicare Other | Admitting: Vascular Surgery

## 2017-05-09 VITALS — BP 144/81 | HR 104 | Resp 17 | Ht 74.0 in | Wt 182.0 lb

## 2017-05-09 DIAGNOSIS — Z716 Tobacco abuse counseling: Secondary | ICD-10-CM | POA: Diagnosis not present

## 2017-05-09 DIAGNOSIS — Z8619 Personal history of other infectious and parasitic diseases: Secondary | ICD-10-CM | POA: Diagnosis not present

## 2017-05-09 DIAGNOSIS — D6859 Other primary thrombophilia: Secondary | ICD-10-CM | POA: Diagnosis not present

## 2017-05-09 DIAGNOSIS — I70221 Atherosclerosis of native arteries of extremities with rest pain, right leg: Secondary | ICD-10-CM

## 2017-05-09 DIAGNOSIS — M79604 Pain in right leg: Secondary | ICD-10-CM

## 2017-05-09 DIAGNOSIS — I749 Embolism and thrombosis of unspecified artery: Secondary | ICD-10-CM

## 2017-05-12 NOTE — Progress Notes (Signed)
MRN : [id number]  Ricardo DRAHEIM Sr. is a 61 y.o. (06-Dec-1955) male who presents with chief complaint of  Chief Complaint  Patient presents with  . Follow-up    1 month f/u  .  History of Present Illness: The patient returns to the office for followup and review of the noninvasive studies. There has been a significant deterioration in the lower extremity symptoms.  The patient notes interval shortening of their claudication distance and development of moderate rest pain symptoms. No new ulcers or wounds have occurred since the last visit.  There have been no significant changes to the patient's overall health care.  The patient denies amaurosis fugax or recent TIA symptoms. There are no recent neurological changes noted. The patient denies history of DVT, PE or superficial thrombophlebitis. The patient denies recent episodes of angina or shortness of breath.     Current Meds  Medication Sig  . apixaban (ELIQUIS) 5 MG TABS tablet Take 1 tablet (5 mg total) by mouth 2 (two) times daily.  [name] aspirin EC 81 MG tablet Take 81 mg by mouth daily.  . Ibuprofen (ADVIL) 200 MG CAPS Take 800 mg by mouth every 8 (eight) hours as needed (for pain).   . Oxycodone HCl 10 MG TABS Take 1 tablet (10 mg total) by mouth every 8 (eight) hours.  . sildenafil (REVATIO) 20 MG tablet Take 3-5 pills as needed prior to sex.    Past Medical History:  Diagnosis Date  . Arthritis    Rheumatoid  . Bipolar disorder ([place])   . Depression   . Fusion of spine   . Headache   . Hepatitis C    HEP "C"  . History of kidney stones   . Hyperlipidemia   . Pericarditis    20 years ago  . Peripheral vascular disease [facility])     Past Surgical History:  Procedure Laterality Date  . ABDOMINAL AORTOGRAM W/LOWER EXTREMITY N/A 01/15/2017   Procedure: Abdominal Aortogram w/Lower Extremity;  Surgeon: Katha Cabal, MD;  Location: [place] [facility];  Service: Cardiovascular;  Laterality: N/A;  . BACK SURGERY   2004   acdf  . LOWER EXTREMITY ANGIOGRAPHY Right 01/15/2017   Procedure: Lower Extremity Angiography;  Surgeon: Katha Cabal, MD;  Location: [place] [facility];  Service: Cardiovascular;  Laterality: Right;  . LOWER EXTREMITY ANGIOGRAPHY Right 02/13/2017   Procedure: Lower Extremity Angiography;  Surgeon: Katha Cabal, MD;  Location: [place] [facility];  Service: Cardiovascular;  Laterality: Right;    Social History Social History  Substance Use Topics  . Smoking status: Current Every Day Smoker    Packs/day: 1.00    Years: 40.00    Types: Cigarettes  . Smokeless tobacco: Never Used  . Alcohol use 1.2 oz/week    2 Shots of liquor per week     Comment: occasional (every 3-4 weeks)     Family History Family History  Problem Relation Age of Onset  . Cancer Mother        liver  . Heart disease Father   . Diabetes Father   . Breast cancer Sister   . Brain cancer Sister   . Prostate cancer Neg Hx   . Bladder Cancer Neg Hx   . Kidney cancer Neg Hx     No Known Allergies   REVIEW OF SYSTEMS (Negative unless checked)  Constitutional: [] Weight loss  [] Fever  [] Chills Cardiac: [] Chest pain   [] Chest pressure   [] Palpitations   []   Shortness of breath when laying flat   [] Shortness of breath with exertion. Vascular:  [x] Pain in legs with walking   [x] Pain in legs at rest  [] History of DVT   [] Phlebitis   [] Swelling in legs   [] Varicose veins   [] Non-healing ulcers Pulmonary:   [] Uses home oxygen   [] Productive cough   [] Hemoptysis   [] Wheeze  [] COPD   [] Asthma Neurologic:  [] Dizziness   [] Seizures   [] History of stroke   [] History of TIA  [] Aphasia   [] Vissual changes   [] Weakness or numbness in arm   [] Weakness or numbness in leg Musculoskeletal:   [] Joint swelling   [] Joint pain   [] Low back pain Hematologic:  [] Easy bruising  [] Easy bleeding   [x] Hypercoagulable state   [] Anemic Gastrointestinal:  [] Diarrhea   [] Vomiting  [] Gastroesophageal reflux/heartburn    [] Difficulty swallowing. Genitourinary:  [] Chronic kidney disease   [] Difficult urination  [] Frequent urination   [] Blood in urine Skin:  [] Rashes   [] Ulcers  Psychological:  [x] History of anxiety   []  History of major depression.  Physical Examination  Vitals:   05/09/17 1354  BP: (!) 144/81  Pulse: (!) 104  Resp: 17  Weight: 82.6 kg (182 lb)  Height: 6\' 2"  (1.88 m)   Body mass index is 23.37 kg/m. Gen: WD/WN, NAD Head: [place]/AT, No temporalis wasting.  Ear/Nose/Throat: Hearing grossly intact, nares w/o erythema or drainage Eyes: PER, EOMI, sclera nonicteric.  Neck: Supple, no large masses.   Pulmonary:  Good air movement, no audible wheezing bilaterally, no use of accessory muscles.  Cardiac: RRR, no JVD Vascular: right leg is cool to touch with 4+ sec cap refill Vessel Right Left  Radial Palpable Palpable  PT Not Palpable 1+ Palpable  DP Not Palpable 1+ Palpable  Gastrointestinal: Non-distended. No guarding/no peritoneal signs.  Musculoskeletal: M/S 5/5 throughout.  No deformity or atrophy.  Neurologic: CN 2-12 intact. Symmetrical.  Speech is fluent. Motor exam as listed above. Psychiatric: Judgment intact, Mood & affect appropriate for pt's clinical situation. Dermatologic: No rashes or ulcers noted.  No changes consistent with cellulitis. Lymph : No lichenification or skin changes of chronic lymphedema.  CBC Lab Results  Component Value Date   WBC 5.6 04/09/2017   HGB 15.9 04/09/2017   HCT 45.9 04/09/2017   MCV 94.8 04/09/2017   PLT 185 04/09/2017    BMET    Component Value Date/Time   NA 131 (L) 04/09/2017 1130   NA 139 12/23/2015 1606   K 3.9 04/09/2017 1130   CL 94 (L) 04/09/2017 1130   CO2 26 04/09/2017 1130   GLUCOSE 110 (H) 04/09/2017 1130   BUN 21 (H) 04/09/2017 1130   BUN 15 12/23/2015 1606   CREATININE 0.93 04/09/2017 1130   CALCIUM 9.4 04/09/2017 1130   GFRNONAA >60 04/09/2017 1130   GFRAA >60 04/09/2017 1130   CrCl cannot be calculated  (Patient's most recent lab result is older than the maximum 21 days allowed.).  COAG No results found for: INR, PROTIME  Radiology No results found.  Assessment/Plan 1. Atherosclerosis of native artery of right lower extremity with rest pain (HCC)  Recommend:  The patient has evidence of severe atherosclerotic changes of the right lower extremity associated with rest pain.  This represents a limb threatening ischemia and places the patient at the risk for limb loss.  Angiography has been performed and the situation is not ideal for intervention.  Given this finding open surgical repair is recommended.   Patient should undergo arterial  reconstruction of the lower extremity with the hope for limb salvage.  The risks and benefits as well as the alternative therapies was discussed in detail with the patient.  All questions were answered.  Patient agrees to proceed with bypass surgery.  The patient will follow up with me in the office after the procedure.    2. History of hepatitis C Continue antiviral medications as needed  3. Primary hypercoagulable state ([place]) Continue anticoagulation for now  4. Pain of right lower extremity See #1  5. Tobacco abuse counseling The need for smoking cessation was stressed    [name], MD  05/12/2017 2:50 PM

## 2017-05-20 ENCOUNTER — Other Ambulatory Visit (INDEPENDENT_AMBULATORY_CARE_PROVIDER_SITE_OTHER): Payer: Self-pay | Admitting: Vascular Surgery

## 2017-05-20 DIAGNOSIS — N185 Chronic kidney disease, stage 5: Secondary | ICD-10-CM

## 2017-05-21 ENCOUNTER — Other Ambulatory Visit (INDEPENDENT_AMBULATORY_CARE_PROVIDER_SITE_OTHER): Payer: Self-pay | Admitting: Vascular Surgery

## 2017-05-21 DIAGNOSIS — I70221 Atherosclerosis of native arteries of extremities with rest pain, right leg: Secondary | ICD-10-CM

## 2017-05-22 ENCOUNTER — Ambulatory Visit (INDEPENDENT_AMBULATORY_CARE_PROVIDER_SITE_OTHER): Payer: Medicare Other

## 2017-05-22 ENCOUNTER — Ambulatory Visit (INDEPENDENT_AMBULATORY_CARE_PROVIDER_SITE_OTHER): Payer: Medicare Other | Admitting: Vascular Surgery

## 2017-05-22 ENCOUNTER — Encounter (INDEPENDENT_AMBULATORY_CARE_PROVIDER_SITE_OTHER): Payer: Self-pay | Admitting: Vascular Surgery

## 2017-05-22 VITALS — BP 133/84 | HR 75 | Resp 17 | Wt 179.0 lb

## 2017-05-22 DIAGNOSIS — M79604 Pain in right leg: Secondary | ICD-10-CM | POA: Diagnosis not present

## 2017-05-22 DIAGNOSIS — I70221 Atherosclerosis of native arteries of extremities with rest pain, right leg: Secondary | ICD-10-CM

## 2017-05-22 DIAGNOSIS — Z716 Tobacco abuse counseling: Secondary | ICD-10-CM | POA: Diagnosis not present

## 2017-05-22 DIAGNOSIS — I749 Embolism and thrombosis of unspecified artery: Secondary | ICD-10-CM

## 2017-05-23 DIAGNOSIS — Z8659 Personal history of other mental and behavioral disorders: Secondary | ICD-10-CM | POA: Diagnosis not present

## 2017-05-23 DIAGNOSIS — F17200 Nicotine dependence, unspecified, uncomplicated: Secondary | ICD-10-CM | POA: Diagnosis not present

## 2017-05-23 DIAGNOSIS — E782 Mixed hyperlipidemia: Secondary | ICD-10-CM | POA: Diagnosis not present

## 2017-05-23 DIAGNOSIS — R0602 Shortness of breath: Secondary | ICD-10-CM | POA: Diagnosis not present

## 2017-05-23 DIAGNOSIS — F319 Bipolar disorder, unspecified: Secondary | ICD-10-CM | POA: Diagnosis not present

## 2017-05-23 DIAGNOSIS — I208 Other forms of angina pectoris: Secondary | ICD-10-CM | POA: Diagnosis not present

## 2017-05-23 DIAGNOSIS — I10 Essential (primary) hypertension: Secondary | ICD-10-CM | POA: Diagnosis not present

## 2017-05-23 DIAGNOSIS — I739 Peripheral vascular disease, unspecified: Secondary | ICD-10-CM | POA: Diagnosis not present

## 2017-05-23 DIAGNOSIS — Z01818 Encounter for other preprocedural examination: Secondary | ICD-10-CM | POA: Diagnosis not present

## 2017-05-23 NOTE — Progress Notes (Signed)
Subjective:    Patient ID: Ricardo Folks Sr., male    DOB: [birth year], 61 y.o.   MRN: [id number] Chief Complaint  Patient presents with  . Follow-up    vein mapping   Patient presents today to review vascular studies. Underwent bilateral lower extremity vein mapping in preparation for his upcoming right lower extremity bypass surgery for peripheral artery disease with rest pain. Symptoms remain stable. There has been no ulcerations or lower extremity. Vein mapping with bilateral lower greater saphenous and small saphenous veins measuring less then 0.30cm. Denies any fever, nausea or vomiting.   Review of Systems  Constitutional: Negative.   HENT: Negative.   Eyes: Negative.   Respiratory: Negative.   Cardiovascular:       Right lower extremity pain  Gastrointestinal: Negative.   Endocrine: Negative.   Genitourinary: Negative.   Musculoskeletal: Negative.   Skin: Negative.   Allergic/Immunologic: Negative.   Neurological: Negative.   Hematological: Negative.   Psychiatric/Behavioral: Negative.       Objective:   Physical Exam  Constitutional: He is oriented to person, place, and time. He appears well-developed and well-nourished. No distress.  HENT:  Head: Normocephalic and atraumatic.  Eyes: Pupils are equal, round, and reactive to light. Conjunctivae are normal.  Neck: Normal range of motion.  Cardiovascular: Normal rate, regular rhythm, normal heart sounds and intact distal pulses.   Pulses:      Radial pulses are 2+ on the right side, and 2+ on the left side.       Dorsalis pedis pulses are 0 on the right side, and 2+ on the left side.       Posterior tibial pulses are 0 on the right side, and 2+ on the left side.  Pulmonary/Chest: Effort normal and breath sounds normal. No respiratory distress. He has no wheezes. He has no rales.  Abdominal: Soft. He exhibits no distension. There is no tenderness. There is no rebound and no guarding.  Musculoskeletal: Normal range  of motion. He exhibits no edema.  Neurological: He is alert and oriented to person, place, and time.  Skin: Skin is warm and dry. He is not diaphoretic.  Psychiatric: He has a normal mood and affect. His behavior is normal. Judgment and thought content normal.  Vitals reviewed.  BP 133/84   Pulse 75   Resp 17   Wt 179 lb (81.2 kg)   BMI 22.98 kg/m   Past Medical History:  Diagnosis Date  . Arthritis    Rheumatoid  . Bipolar disorder ([place])   . Depression   . Fusion of spine   . Headache   . Hepatitis C    HEP "C"  . History of kidney stones   . Hyperlipidemia   . Pericarditis    20 years ago  . Peripheral vascular disease [facility])     Social History   Social History  . Marital status: Married    Spouse name: N/A  . Number of children: N/A  . Years of education: N/A   Occupational History  . Not on file.   Social History Main Topics  . Smoking status: Current Every Day Smoker    Packs/day: 1.00    Years: 40.00    Types: Cigarettes  . Smokeless tobacco: Never Used  . Alcohol use 1.2 oz/week    2 Shots of liquor per week     Comment: occasional (every 3-4 weeks)   . Drug use: No     Comment: polysubstance approx 2  years ago  . Sexual activity: Not Currently   Other Topics Concern  . Not on file   Social History Narrative  . No narrative on file    Past Surgical History:  Procedure Laterality Date  . ABDOMINAL AORTOGRAM W/LOWER EXTREMITY N/A 01/15/2017   Procedure: Abdominal Aortogram w/Lower Extremity;  Surgeon: Katha Cabal, MD;  Location: [place] [facility];  Service: Cardiovascular;  Laterality: N/A;  . BACK SURGERY  2004   acdf  . LOWER EXTREMITY ANGIOGRAPHY Right 01/15/2017   Procedure: Lower Extremity Angiography;  Surgeon: Katha Cabal, MD;  Location: [place] [facility];  Service: Cardiovascular;  Laterality: Right;  . LOWER EXTREMITY ANGIOGRAPHY Right 02/13/2017   Procedure: Lower Extremity Angiography;  Surgeon: Katha Cabal,  MD;  Location: [place] [facility];  Service: Cardiovascular;  Laterality: Right;    Family History  Problem Relation Age of Onset  . Cancer Mother        liver  . Heart disease Father   . Diabetes Father   . Breast cancer Sister   . Brain cancer Sister   . Prostate cancer Neg Hx   . Bladder Cancer Neg Hx   . Kidney cancer Neg Hx     No Known Allergies     Assessment & Plan:  Patient presents today to review vascular studies. Underwent bilateral lower extremity vein mapping in preparation for his upcoming right lower extremity bypass surgery for peripheral artery disease with rest pain. Symptoms remain stable. There has been no ulcerations or lower extremity. Vein mapping with bilateral lower greater saphenous and small saphenous veins measuring less then 0.30cm. Denies any fever, nausea or vomiting.  1. Atherosclerosis of native artery of right lower extremity with rest pain ([place]) - Stable Patient with right lower extremity rest pain symptoms. This is stable. No new health issues since last visit. No new medications since last visit. H&P updated. Patient to undergo cardiac clearance with Dr. Satira Mccallum today. Bilateral vein mapping notable for bilateral greater saphenous and small saphenous veins measuring less than 0.30cm We'll need to use graft consisted of patient's own veins due to small size. Procedure, risk and benefits explained to the patient. All questions answered. Patient wishes to proceed.  2. Tobacco abuse counseling - Stable Encouraged good control as its slows the progression of atherosclerotic disease  3. Pain of right lower extremity - Stable This is stable. No ulcerations to the lower extremity.  Current Outpatient Prescriptions on File Prior to Visit  Medication Sig Dispense Refill  . apixaban (ELIQUIS) 5 MG TABS tablet Take 1 tablet (5 mg total) by mouth 2 (two) times daily. 60 tablet 5  . aspirin EC 81 MG tablet Take 81 mg by mouth daily.    .  cyanocobalamin 500 MCG tablet Take 1 tablet (500 mcg total) by mouth daily. 30 tablet 3  . folic acid (FOLVITE) 1 MG tablet Take 1 tablet (1 mg total) by mouth daily. 30 tablet 3  . Ibuprofen (ADVIL) 200 MG CAPS Take 800 mg by mouth every 8 (eight) hours as needed (for pain).     . Oxycodone HCl 10 MG TABS Take 1 tablet (10 mg total) by mouth every 8 (eight) hours. 50 tablet 0  . pyridOXINE (VITAMIN B-6) 25 MG tablet Take 1 tablet (25 mg total) by mouth daily. 30 tablet 3  . sildenafil (REVATIO) 20 MG tablet Take 3-5 pills as needed prior to sex. 30 tablet 1   No current facility-administered medications on  file prior to visit.     There are no Patient Instructions on file for this visit. No Follow-up on file.   Arisbeth Purrington A Auston Halfmann, PA-C

## 2017-05-30 DIAGNOSIS — I208 Other forms of angina pectoris: Secondary | ICD-10-CM | POA: Diagnosis not present

## 2017-05-30 DIAGNOSIS — R0602 Shortness of breath: Secondary | ICD-10-CM | POA: Diagnosis not present

## 2017-06-04 DIAGNOSIS — I739 Peripheral vascular disease, unspecified: Secondary | ICD-10-CM | POA: Diagnosis not present

## 2017-06-04 DIAGNOSIS — Z01818 Encounter for other preprocedural examination: Secondary | ICD-10-CM | POA: Diagnosis not present

## 2017-06-04 DIAGNOSIS — I10 Essential (primary) hypertension: Secondary | ICD-10-CM | POA: Diagnosis not present

## 2017-06-04 DIAGNOSIS — F17200 Nicotine dependence, unspecified, uncomplicated: Secondary | ICD-10-CM | POA: Diagnosis not present

## 2017-06-04 DIAGNOSIS — I208 Other forms of angina pectoris: Secondary | ICD-10-CM | POA: Diagnosis not present

## 2017-06-04 DIAGNOSIS — Z8659 Personal history of other mental and behavioral disorders: Secondary | ICD-10-CM | POA: Diagnosis not present

## 2017-06-04 DIAGNOSIS — R0602 Shortness of breath: Secondary | ICD-10-CM | POA: Diagnosis not present

## 2017-06-04 DIAGNOSIS — E782 Mixed hyperlipidemia: Secondary | ICD-10-CM | POA: Diagnosis not present

## 2017-06-04 DIAGNOSIS — F319 Bipolar disorder, unspecified: Secondary | ICD-10-CM | POA: Diagnosis not present

## 2017-06-06 ENCOUNTER — Other Ambulatory Visit (INDEPENDENT_AMBULATORY_CARE_PROVIDER_SITE_OTHER): Payer: Self-pay | Admitting: Vascular Surgery

## 2017-06-06 ENCOUNTER — Encounter (INDEPENDENT_AMBULATORY_CARE_PROVIDER_SITE_OTHER): Payer: Self-pay

## 2017-06-13 ENCOUNTER — Encounter
Admission: RE | Admit: 2017-06-13 | Discharge: 2017-06-13 | Disposition: A | Discharge status: Home / Self Care | Payer: Medicare Other | Source: Ambulatory Visit | Attending: Vascular Surgery | Admitting: Vascular Surgery

## 2017-06-13 DIAGNOSIS — Z01812 Encounter for preprocedural laboratory examination: Secondary | ICD-10-CM | POA: Insufficient documentation

## 2017-06-13 DIAGNOSIS — Z01810 Encounter for preprocedural cardiovascular examination: Secondary | ICD-10-CM | POA: Insufficient documentation

## 2017-06-13 HISTORY — DX: Other complications of anesthesia, initial encounter: T88.59XA

## 2017-06-13 HISTORY — DX: Nicotine dependence, unspecified, uncomplicated: F17.200

## 2017-06-13 HISTORY — DX: Adverse effect of unspecified anesthetic, initial encounter: T41.45XA

## 2017-06-13 HISTORY — DX: Gastro-esophageal reflux disease without esophagitis: K21.9

## 2017-06-13 LAB — CBC WITH DIFFERENTIAL/PLATELET
BASOS ABS: 0 10*3/uL (ref 0–0.1)
BASOS PCT: 1 %
Eosinophils Absolute: 0.1 10*3/uL (ref 0–0.7)
Eosinophils Relative: 2 %
HEMATOCRIT: 48.9 % (ref 40.0–52.0)
HEMOGLOBIN: 16.6 g/dL (ref 13.0–18.0)
LYMPHS PCT: 24 %
Lymphs Abs: 1.5 10*3/uL (ref 1.0–3.6)
MCH: 31.4 pg (ref 26.0–34.0)
MCHC: 33.9 g/dL (ref 32.0–36.0)
MCV: 92.6 fL (ref 80.0–100.0)
Monocytes Absolute: 0.4 10*3/uL (ref 0.2–1.0)
Monocytes Relative: 6 %
NEUTROS ABS: 4.4 10*3/uL (ref 1.4–6.5)
NEUTROS PCT: 67 %
Platelets: 154 10*3/uL (ref 150–440)
RBC: 5.28 MIL/uL (ref 4.40–5.90)
RDW: 14.2 % (ref 11.5–14.5)
WBC: 6.5 10*3/uL (ref 3.8–10.6)

## 2017-06-13 LAB — BASIC METABOLIC PANEL
ANION GAP: 10 (ref 5–15)
BUN: 31 mg/dL — ABNORMAL HIGH (ref 6–20)
CALCIUM: 10.1 mg/dL (ref 8.9–10.3)
CO2: 31 mmol/L (ref 22–32)
Chloride: 95 mmol/L — ABNORMAL LOW (ref 101–111)
Creatinine, Ser: 1.17 mg/dL (ref 0.61–1.24)
Glucose, Bld: 97 mg/dL (ref 65–99)
POTASSIUM: 4 mmol/L (ref 3.5–5.1)
Sodium: 136 mmol/L (ref 135–145)

## 2017-06-13 LAB — APTT: APTT: 33 s (ref 24–36)

## 2017-06-13 LAB — PROTIME-INR
INR: 1.1
PROTHROMBIN TIME: 14.1 s (ref 11.4–15.2)

## 2017-06-13 LAB — SURGICAL PCR SCREEN
MRSA, PCR: NEGATIVE
Staphylococcus aureus: NEGATIVE

## 2017-06-13 NOTE — Patient Instructions (Addendum)
Your procedure is scheduled on: June 19, 2017  Report to [place]  To find out your arrival time please call [phone] between 1PM - 3PM on Tuesday, June 18, 2017    Remember: Instructions that are not followed completely may result in serious medical risk, up to and including death, or upon the discretion of your surgeon and anesthesiologist your surgery may need to be rescheduled.     _X__ 1. Do not eat food after midnight the night before your procedure.                 No gum chewing or hard candies. You may drink clear liquids up to 2 hours                 before you are scheduled to arrive for your surgery- DO not drink clear                 liquids within 2 hours of the start of your surgery.                 Clear Liquids include:  water, apple juice without pulp, clear carbohydrate                 drink such as Clearfast of [place], Black Coffee or Tea (Do not add                 anything to coffee or tea).     _X__ 2.  No Alcohol for 24 hours before or after surgery.   _X__ 3.  Do Not Smoke or use e-cigarettes For 24 Hours Prior to Your Surgery.                 Do not use any chewable tobacco products for at least 6 hours prior to                 surgery.  ____  4.  Bring all medications with you on the day of surgery if instructed.   __X__  5.  Notify your doctor if there is any change in your medical condition      (cold, fever, infections).     Do not wear jewelry, make-up, hairpins, clips or nail polish. Do not wear lotions, powders, or perfumes. You may wear deodorant. Do not shave 48 hours prior to surgery. Men may shave face and neck. Do not bring valuables to the hospital.    [facility] is not responsible for any belongings or valuables.  Contacts, dentures or bridgework may not be worn into surgery. Leave your suitcase in the car. After surgery it may be brought to your room. For patients admitted to the  hospital, discharge time is determined by your treatment team.   Patients discharged the day of surgery will not be allowed to drive home.   Please read over the following fact sheets that you were given:   PREPARING FOR SURGERY/[facility] [place]   ____ Take these medicines the morning of surgery with A SIP OF WATER:    1. ELIQUIS (as per Dr. Delana Meyer)  2. ASPIRIN (as per Dr. Delana Meyer)  3. METOPROLOL  4.  5.  6.  ____ Fleet Enema (as directed)   __X__ Use CHG Soap as directed  ____ Use inhalers on the day of surgery  ____ Stop metformin 2 days prior to surgery  ____ Take 1/2 of usual insulin dose the night before surgery. No insulin the morning          of surgery.   __X__ Stop Anti-inflammatories AS OF TODAY... This includes ibuprofen/motrin/advil/aleve   ____ Stop supplements until after surgery.    ____ Bring C-Pap to the hospital.   Wear comfortable clothes to the hospital and shoes that are easy to slip on.  Please be patient with the discharge process so you can receive ALL of the important information that we want to send you home with!!!

## 2017-06-18 MED ORDER — CEFAZOLIN SODIUM-DEXTROSE 2-4 GM/100ML-% IV SOLN
2.0000 g | INTRAVENOUS | Status: AC
  Administered 2017-06-19: 2 g via INTRAVENOUS

## 2017-06-19 ENCOUNTER — Inpatient Hospital Stay: Payer: Medicare Other | Admitting: Anesthesiology

## 2017-06-19 ENCOUNTER — Encounter
Admission: RE | Disposition: A | Discharge status: Home / Self Care | Payer: Self-pay | Source: Ambulatory Visit | Attending: Vascular Surgery

## 2017-06-19 ENCOUNTER — Inpatient Hospital Stay
Admission: RE | Admit: 2017-06-19 | Discharge: 2017-06-22 | DRG: 253 | Disposition: A | Discharge status: Home / Self Care | Payer: Medicare Other | Source: Ambulatory Visit | Attending: Vascular Surgery | Admitting: Vascular Surgery

## 2017-06-19 DIAGNOSIS — Z981 Arthrodesis status: Secondary | ICD-10-CM

## 2017-06-19 DIAGNOSIS — F17210 Nicotine dependence, cigarettes, uncomplicated: Secondary | ICD-10-CM | POA: Diagnosis present

## 2017-06-19 DIAGNOSIS — D6859 Other primary thrombophilia: Secondary | ICD-10-CM | POA: Diagnosis present

## 2017-06-19 DIAGNOSIS — I739 Peripheral vascular disease, unspecified: Secondary | ICD-10-CM | POA: Diagnosis not present

## 2017-06-19 DIAGNOSIS — I70221 Atherosclerosis of native arteries of extremities with rest pain, right leg: Principal | ICD-10-CM | POA: Diagnosis present

## 2017-06-19 DIAGNOSIS — M79604 Pain in right leg: Secondary | ICD-10-CM | POA: Diagnosis not present

## 2017-06-19 DIAGNOSIS — E785 Hyperlipidemia, unspecified: Secondary | ICD-10-CM | POA: Diagnosis present

## 2017-06-19 DIAGNOSIS — Z79899 Other long term (current) drug therapy: Secondary | ICD-10-CM | POA: Diagnosis not present

## 2017-06-19 DIAGNOSIS — F319 Bipolar disorder, unspecified: Secondary | ICD-10-CM | POA: Diagnosis present

## 2017-06-19 DIAGNOSIS — M1990 Unspecified osteoarthritis, unspecified site: Secondary | ICD-10-CM | POA: Diagnosis not present

## 2017-06-19 DIAGNOSIS — M79671 Pain in right foot: Secondary | ICD-10-CM | POA: Diagnosis not present

## 2017-06-19 DIAGNOSIS — B1920 Unspecified viral hepatitis C without hepatic coma: Secondary | ICD-10-CM | POA: Diagnosis present

## 2017-06-19 DIAGNOSIS — K219 Gastro-esophageal reflux disease without esophagitis: Secondary | ICD-10-CM | POA: Diagnosis present

## 2017-06-19 DIAGNOSIS — M069 Rheumatoid arthritis, unspecified: Secondary | ICD-10-CM | POA: Diagnosis present

## 2017-06-19 DIAGNOSIS — I70219 Atherosclerosis of native arteries of extremities with intermittent claudication, unspecified extremity: Secondary | ICD-10-CM | POA: Diagnosis present

## 2017-06-19 HISTORY — PX: FEMORAL-POPLITEAL BYPASS GRAFT: SHX937

## 2017-06-19 LAB — CBC
HEMATOCRIT: 48.4 % (ref 40.0–52.0)
HEMOGLOBIN: 16.2 g/dL (ref 13.0–18.0)
MCH: 32 pg (ref 26.0–34.0)
MCHC: 33.5 g/dL (ref 32.0–36.0)
MCV: 95.6 fL (ref 80.0–100.0)
Platelets: 140 10*3/uL — ABNORMAL LOW (ref 150–440)
RBC: 5.06 MIL/uL (ref 4.40–5.90)
RDW: 14.1 % (ref 11.5–14.5)
WBC: 9.5 10*3/uL (ref 3.8–10.6)

## 2017-06-19 LAB — URINE DRUG SCREEN, QUALITATIVE (ARMC ONLY)
Amphetamines, Ur Screen: NOT DETECTED
BARBITURATES, UR SCREEN: NOT DETECTED
Benzodiazepine, Ur Scrn: NOT DETECTED
CANNABINOID 50 NG, UR ~~LOC~~: NOT DETECTED
Cocaine Metabolite,Ur ~~LOC~~: NOT DETECTED
MDMA (Ecstasy)Ur Screen: NOT DETECTED
Methadone Scn, Ur: NOT DETECTED
Opiate, Ur Screen: NOT DETECTED
Phencyclidine (PCP) Ur S: NOT DETECTED
TRICYCLIC, UR SCREEN: NOT DETECTED

## 2017-06-19 LAB — CREATININE, SERUM: Creatinine, Ser: 1.09 mg/dL (ref 0.61–1.24)

## 2017-06-19 LAB — GLUCOSE, CAPILLARY: GLUCOSE-CAPILLARY: 143 mg/dL — AB (ref 65–99)

## 2017-06-19 LAB — ABO/RH: ABO/RH(D): A POS

## 2017-06-19 SURGERY — BYPASS GRAFT FEMORAL-POPLITEAL ARTERY
Anesthesia: General | Laterality: Right

## 2017-06-19 MED ORDER — HEPARIN SODIUM (PORCINE) 5000 UNIT/ML IJ SOLN
INTRAMUSCULAR | Status: AC
  Filled 2017-06-19: qty 1

## 2017-06-19 MED ORDER — LACTATED RINGERS IV SOLN: INTRAVENOUS | Status: DC

## 2017-06-19 MED ORDER — SUGAMMADEX SODIUM 200 MG/2ML IV SOLN
INTRAVENOUS | Status: AC
  Filled 2017-06-19: qty 2

## 2017-06-19 MED ORDER — ONDANSETRON HCL 4 MG/2ML IJ SOLN: 4.0000 mg | Freq: Once | INTRAMUSCULAR | Status: DC | PRN

## 2017-06-19 MED ORDER — ENOXAPARIN SODIUM 40 MG/0.4ML ~~LOC~~ SOLN
40.0000 mg | SUBCUTANEOUS | Status: DC
  Administered 2017-06-20 – 2017-06-21 (×2): 40 mg via SUBCUTANEOUS
  Filled 2017-06-19 (×2): qty 0.4

## 2017-06-19 MED ORDER — DEXAMETHASONE SODIUM PHOSPHATE 10 MG/ML IJ SOLN
INTRAMUSCULAR | Status: DC | PRN
  Administered 2017-06-19: 5 mg via INTRAVENOUS

## 2017-06-19 MED ORDER — SORBITOL 70 % SOLN
30.0000 mL | Freq: Every day | Status: DC | PRN
  Filled 2017-06-19: qty 30

## 2017-06-19 MED ORDER — CEFAZOLIN SODIUM-DEXTROSE 1-4 GM/50ML-% IV SOLN
INTRAVENOUS | Status: DC | PRN
  Administered 2017-06-19: 1 g via INTRAVENOUS

## 2017-06-19 MED ORDER — DOPAMINE-DEXTROSE 3.2-5 MG/ML-% IV SOLN: 3.0000 ug/kg/min | INTRAVENOUS | Status: DC

## 2017-06-19 MED ORDER — FENTANYL CITRATE (PF) 100 MCG/2ML IJ SOLN
25.0000 ug | INTRAMUSCULAR | Status: DC | PRN
  Administered 2017-06-19 (×3): 25 ug via INTRAVENOUS

## 2017-06-19 MED ORDER — SODIUM CHLORIDE 0.9 % IV SOLN
INTRAVENOUS | Status: DC | PRN
  Administered 2017-06-19: 20 ug/min via INTRAVENOUS

## 2017-06-19 MED ORDER — ONDANSETRON HCL 4 MG/2ML IJ SOLN: 4.0000 mg | Freq: Four times a day (QID) | INTRAMUSCULAR | Status: DC | PRN

## 2017-06-19 MED ORDER — ESMOLOL HCL-SODIUM CHLORIDE 2000 MG/100ML IV SOLN
25.0000 ug/kg/min | INTRAVENOUS | Status: DC
  Filled 2017-06-19: qty 100

## 2017-06-19 MED ORDER — GUAIFENESIN-DM 100-10 MG/5ML PO SYRP
15.0000 mL | ORAL_SOLUTION | ORAL | Status: DC | PRN
  Filled 2017-06-19: qty 15

## 2017-06-19 MED ORDER — POTASSIUM CHLORIDE CRYS ER 20 MEQ PO TBCR: 20.0000 meq | EXTENDED_RELEASE_TABLET | Freq: Every day | ORAL | Status: DC | PRN

## 2017-06-19 MED ORDER — ONDANSETRON HCL 4 MG/2ML IJ SOLN
INTRAMUSCULAR | Status: AC
  Filled 2017-06-19: qty 2

## 2017-06-19 MED ORDER — METOPROLOL SUCCINATE ER 25 MG PO TB24
25.0000 mg | ORAL_TABLET | Freq: Every day | ORAL | Status: DC
  Administered 2017-06-21 – 2017-06-22 (×2): 25 mg via ORAL
  Filled 2017-06-19 (×2): qty 1

## 2017-06-19 MED ORDER — FENTANYL CITRATE (PF) 250 MCG/5ML IJ SOLN
INTRAMUSCULAR | Status: AC
  Filled 2017-06-19: qty 5

## 2017-06-19 MED ORDER — ACETAMINOPHEN 325 MG PO TABS: 325.0000 mg | ORAL_TABLET | ORAL | Status: DC | PRN

## 2017-06-19 MED ORDER — OXYCODONE-ACETAMINOPHEN 5-325 MG PO TABS
1.0000 | ORAL_TABLET | ORAL | Status: DC | PRN
  Administered 2017-06-19: 2 via ORAL
  Administered 2017-06-20: 1 via ORAL
  Administered 2017-06-20 – 2017-06-22 (×11): 2 via ORAL
  Filled 2017-06-19 (×13): qty 2

## 2017-06-19 MED ORDER — SODIUM CHLORIDE 0.9 % IV SOLN
INTRAVENOUS | Status: DC
  Administered 2017-06-19: 18:00:00 via INTRAVENOUS

## 2017-06-19 MED ORDER — FAMOTIDINE 20 MG PO TABS
ORAL_TABLET | ORAL | Status: AC
  Administered 2017-06-19: 20 mg via ORAL
  Filled 2017-06-19: qty 1

## 2017-06-19 MED ORDER — LABETALOL HCL 5 MG/ML IV SOLN: 10.0000 mg | INTRAVENOUS | Status: DC | PRN

## 2017-06-19 MED ORDER — SODIUM CHLORIDE 0.9 % IV SOLN
INTRAVENOUS | Status: DC | PRN
  Administered 2017-06-19: 300 mL

## 2017-06-19 MED ORDER — ALUM & MAG HYDROXIDE-SIMETH 200-200-20 MG/5ML PO SUSP
15.0000 mL | ORAL | Status: DC | PRN
  Filled 2017-06-19: qty 30

## 2017-06-19 MED ORDER — HYDRALAZINE HCL 20 MG/ML IJ SOLN: 5.0000 mg | INTRAMUSCULAR | Status: DC | PRN

## 2017-06-19 MED ORDER — MORPHINE SULFATE (PF) 2 MG/ML IV SOLN
2.0000 mg | INTRAVENOUS | Status: DC | PRN
  Administered 2017-06-19 (×2): 2 mg via INTRAVENOUS
  Administered 2017-06-19 (×2): 4 mg via INTRAVENOUS
  Administered 2017-06-20 (×2): 5 mg via INTRAVENOUS
  Administered 2017-06-21: 2 mg via INTRAVENOUS
  Filled 2017-06-19 (×2): qty 1
  Filled 2017-06-19: qty 2
  Filled 2017-06-19: qty 1
  Filled 2017-06-19 (×2): qty 3
  Filled 2017-06-19: qty 2

## 2017-06-19 MED ORDER — SUGAMMADEX SODIUM 200 MG/2ML IV SOLN
INTRAVENOUS | Status: DC | PRN
  Administered 2017-06-19: 175 mg via INTRAVENOUS

## 2017-06-19 MED ORDER — FENTANYL CITRATE (PF) 100 MCG/2ML IJ SOLN
INTRAMUSCULAR | Status: AC
  Administered 2017-06-19: 25 ug via INTRAVENOUS
  Filled 2017-06-19: qty 2

## 2017-06-19 MED ORDER — PROPOFOL 10 MG/ML IV BOLUS
INTRAVENOUS | Status: DC | PRN
  Administered 2017-06-19: 150 mg via INTRAVENOUS

## 2017-06-19 MED ORDER — ROCURONIUM BROMIDE 50 MG/5ML IV SOLN
INTRAVENOUS | Status: AC
  Filled 2017-06-19: qty 1

## 2017-06-19 MED ORDER — DEXTROSE 5 % IV SOLN
1.5000 g | Freq: Two times a day (BID) | INTRAVENOUS | Status: AC
  Administered 2017-06-19 – 2017-06-20 (×2): 1.5 g via INTRAVENOUS
  Filled 2017-06-19 (×2): qty 1.5

## 2017-06-19 MED ORDER — EPHEDRINE SULFATE 50 MG/ML IJ SOLN
INTRAMUSCULAR | Status: DC | PRN
  Administered 2017-06-19 (×2): 5 mg via INTRAVENOUS

## 2017-06-19 MED ORDER — CEFAZOLIN SODIUM-DEXTROSE 2-4 GM/100ML-% IV SOLN
INTRAVENOUS | Status: AC
  Filled 2017-06-19: qty 100

## 2017-06-19 MED ORDER — CHLORHEXIDINE GLUCONATE CLOTH 2 % EX PADS
6.0000 | MEDICATED_PAD | Freq: Once | CUTANEOUS | Status: DC
Start: 2017-06-19 — End: 2017-06-19

## 2017-06-19 MED ORDER — CEFAZOLIN SODIUM 1 G IJ SOLR
INTRAMUSCULAR | Status: AC
  Filled 2017-06-19: qty 10

## 2017-06-19 MED ORDER — FENTANYL CITRATE (PF) 100 MCG/2ML IJ SOLN
INTRAMUSCULAR | Status: DC | PRN
  Administered 2017-06-19: 50 ug via INTRAVENOUS
  Administered 2017-06-19: 100 ug via INTRAVENOUS

## 2017-06-19 MED ORDER — LACTATED RINGERS IV SOLN: INTRAVENOUS | Status: DC | PRN

## 2017-06-19 MED ORDER — EVICEL 2 ML EX KIT
PACK | CUTANEOUS | Status: DC | PRN
  Administered 2017-06-19: 2 mL

## 2017-06-19 MED ORDER — PROPOFOL 10 MG/ML IV BOLUS
INTRAVENOUS | Status: AC
  Filled 2017-06-19: qty 20

## 2017-06-19 MED ORDER — MIDAZOLAM HCL 2 MG/2ML IJ SOLN
INTRAMUSCULAR | Status: DC | PRN
  Administered 2017-06-19: 2 mg via INTRAVENOUS

## 2017-06-19 MED ORDER — METOPROLOL TARTRATE 25 MG PO TABS: 25.0000 mg | ORAL_TABLET | Freq: Every day | ORAL | Status: DC

## 2017-06-19 MED ORDER — FAMOTIDINE 20 MG PO TABS
20.0000 mg | ORAL_TABLET | Freq: Once | ORAL | Status: AC
  Administered 2017-06-19: 20 mg via ORAL

## 2017-06-19 MED ORDER — ASPIRIN EC 81 MG PO TBEC
81.0000 mg | DELAYED_RELEASE_TABLET | Freq: Every day | ORAL | Status: DC
  Administered 2017-06-20 – 2017-06-22 (×3): 81 mg via ORAL
  Filled 2017-06-19 (×3): qty 1

## 2017-06-19 MED ORDER — MAGNESIUM SULFATE 2 GM/50ML IV SOLN: 2.0000 g | Freq: Every day | INTRAVENOUS | Status: DC | PRN

## 2017-06-19 MED ORDER — NITROGLYCERIN IN D5W 200-5 MCG/ML-% IV SOLN: 5.0000 ug/min | INTRAVENOUS | Status: DC

## 2017-06-19 MED ORDER — LACTATED RINGERS IV SOLN
INTRAVENOUS | Status: DC
  Administered 2017-06-19: 10:00:00 via INTRAVENOUS

## 2017-06-19 MED ORDER — DOCUSATE SODIUM 100 MG PO CAPS
100.0000 mg | ORAL_CAPSULE | Freq: Every day | ORAL | Status: DC
  Administered 2017-06-20 – 2017-06-22 (×3): 100 mg via ORAL
  Filled 2017-06-19 (×3): qty 1

## 2017-06-19 MED ORDER — HEPARIN SODIUM (PORCINE) 1000 UNIT/ML IJ SOLN
INTRAMUSCULAR | Status: DC | PRN
  Administered 2017-06-19: 5000 [IU] via INTRAVENOUS
  Administered 2017-06-19 (×2): 1000 [IU] via INTRAVENOUS

## 2017-06-19 MED ORDER — PHENOL 1.4 % MT LIQD
1.0000 | OROMUCOSAL | Status: DC | PRN
  Filled 2017-06-19: qty 177

## 2017-06-19 MED ORDER — DEXAMETHASONE SODIUM PHOSPHATE 10 MG/ML IJ SOLN
INTRAMUSCULAR | Status: AC
  Filled 2017-06-19: qty 1

## 2017-06-19 MED ORDER — SENNOSIDES-DOCUSATE SODIUM 8.6-50 MG PO TABS: 1.0000 | ORAL_TABLET | Freq: Every evening | ORAL | Status: DC | PRN

## 2017-06-19 MED ORDER — IBUPROFEN 400 MG PO TABS: 800.0000 mg | ORAL_TABLET | Freq: Three times a day (TID) | ORAL | Status: DC | PRN

## 2017-06-19 MED ORDER — ACETAMINOPHEN 650 MG RE SUPP: 325.0000 mg | RECTAL | Status: DC | PRN

## 2017-06-19 MED ORDER — LIDOCAINE HCL (PF) 2 % IJ SOLN
INTRAMUSCULAR | Status: AC
  Filled 2017-06-19: qty 10

## 2017-06-19 MED ORDER — ONDANSETRON HCL 4 MG/2ML IJ SOLN
INTRAMUSCULAR | Status: DC | PRN
  Administered 2017-06-19: 4 mg via INTRAVENOUS

## 2017-06-19 MED ORDER — LIDOCAINE HCL (CARDIAC) 20 MG/ML IV SOLN
INTRAVENOUS | Status: DC | PRN
  Administered 2017-06-19: 50 mg via INTRAVENOUS

## 2017-06-19 MED ORDER — SODIUM CHLORIDE 0.9 % IV SOLN: 500.0000 mL | Freq: Once | INTRAVENOUS | Status: DC | PRN

## 2017-06-19 MED ORDER — SODIUM CHLORIDE 0.9 % IJ SOLN
INTRAMUSCULAR | Status: AC
  Filled 2017-06-19: qty 10

## 2017-06-19 MED ORDER — PHENYLEPHRINE HCL 10 MG/ML IJ SOLN
INTRAMUSCULAR | Status: DC | PRN
  Administered 2017-06-19 (×3): 100 ug via INTRAVENOUS

## 2017-06-19 MED ORDER — HEPARIN SODIUM (PORCINE) 1000 UNIT/ML IJ SOLN
INTRAMUSCULAR | Status: AC
  Filled 2017-06-19: qty 1

## 2017-06-19 MED ORDER — CHLORHEXIDINE GLUCONATE CLOTH 2 % EX PADS: 6.0000 | MEDICATED_PAD | Freq: Once | CUTANEOUS | Status: DC

## 2017-06-19 MED ORDER — SUCCINYLCHOLINE CHLORIDE 20 MG/ML IJ SOLN
INTRAMUSCULAR | Status: DC | PRN
  Administered 2017-06-19: 100 mg via INTRAVENOUS

## 2017-06-19 MED ORDER — ROCURONIUM BROMIDE 100 MG/10ML IV SOLN
INTRAVENOUS | Status: DC | PRN
  Administered 2017-06-19 (×2): 20 mg via INTRAVENOUS
  Administered 2017-06-19: 10 mg via INTRAVENOUS
  Administered 2017-06-19: 20 mg via INTRAVENOUS
  Administered 2017-06-19 (×3): 10 mg via INTRAVENOUS

## 2017-06-19 MED ORDER — MIDAZOLAM HCL 2 MG/2ML IJ SOLN
INTRAMUSCULAR | Status: AC
  Filled 2017-06-19: qty 2

## 2017-06-19 MED ORDER — FAMOTIDINE IN NACL 20-0.9 MG/50ML-% IV SOLN
20.0000 mg | Freq: Two times a day (BID) | INTRAVENOUS | Status: DC
  Administered 2017-06-19 – 2017-06-21 (×4): 20 mg via INTRAVENOUS
  Filled 2017-06-19 (×5): qty 50

## 2017-06-19 MED ORDER — METOPROLOL TARTRATE 5 MG/5ML IV SOLN: 2.0000 mg | INTRAVENOUS | Status: DC | PRN

## 2017-06-19 SURGICAL SUPPLY — 88 items
APPLIER CLIP 11 MED OPEN (CLIP)
APPLIER CLIP 9.375 SM OPEN (CLIP)
BAG COUNTER SPONGE EZ (MISCELLANEOUS) ×2 IMPLANT
BAG DECANTER FOR FLEXI CONT (MISCELLANEOUS) ×3 IMPLANT
BAG ISOLATATION DRAPE 20X20 ST (DRAPES) ×1 IMPLANT
BLADE SURG 15 STRL LF DISP TIS (BLADE) ×1 IMPLANT
BLADE SURG 15 STRL SS (BLADE) ×2
BLADE SURG SZ11 CARB STEEL (BLADE) ×3 IMPLANT
BOOT SUTURE AID YELLOW STND (SUTURE) ×3 IMPLANT
BRUSH SCRUB EZ  4% CHG (MISCELLANEOUS) ×2
BRUSH SCRUB EZ 4% CHG (MISCELLANEOUS) ×1 IMPLANT
CANISTER SUCT 1200ML W/VALVE (MISCELLANEOUS) ×3 IMPLANT
CATH EMBOLECTOMY 2X60 (CATHETERS) ×3 IMPLANT
CLIP APPLIE 11 MED OPEN (CLIP) IMPLANT
CLIP APPLIE 9.375 SM OPEN (CLIP) IMPLANT
COUNTER SPONGE BAG EZ (MISCELLANEOUS) ×1
DERMABOND ADVANCED (GAUZE/BANDAGES/DRESSINGS) ×4
DERMABOND ADVANCED .7 DNX12 (GAUZE/BANDAGES/DRESSINGS) ×2 IMPLANT
DRAPE INCISE IOBAN 66X45 STRL (DRAPES) ×3 IMPLANT
DRAPE ISOLATE BAG 20X20 STRL (DRAPES) ×2
DRAPE SHEET LG 3/4 BI-LAMINATE (DRAPES) ×3 IMPLANT
DRESSING SURGICEL FIBRLLR 1X2 (HEMOSTASIS) ×2 IMPLANT
DRSG OPSITE POSTOP 4X10 (GAUZE/BANDAGES/DRESSINGS) ×6 IMPLANT
DRSG OPSITE POSTOP 4X8 (GAUZE/BANDAGES/DRESSINGS) ×3 IMPLANT
DRSG SURGICEL FIBRILLAR 1X2 (HEMOSTASIS) ×6
DRSG TEGADERM 4X10 (GAUZE/BANDAGES/DRESSINGS) IMPLANT
DRSG TEGADERM 4X4.75 (GAUZE/BANDAGES/DRESSINGS) IMPLANT
DRSG TELFA 3X8 NADH (GAUZE/BANDAGES/DRESSINGS) IMPLANT
DURAPREP 26ML APPLICATOR (WOUND CARE) ×6 IMPLANT
ELECT CAUTERY BLADE 6.4 (BLADE) ×6 IMPLANT
ELECT REM PT RETURN 9FT ADLT (ELECTROSURGICAL) ×3
ELECTRODE REM PT RTRN 9FT ADLT (ELECTROSURGICAL) ×1 IMPLANT
GEL ULTRASOUND 20GR AQUASONIC (MISCELLANEOUS) IMPLANT
GLOVE BIO SURGEON STRL SZ7 (GLOVE) ×12 IMPLANT
GLOVE INDICATOR 7.5 STRL GRN (GLOVE) ×6 IMPLANT
GLOVE SURG SYN 8.0 (GLOVE) ×3 IMPLANT
GOWN STRL REUS W/ TWL LRG LVL3 (GOWN DISPOSABLE) ×3 IMPLANT
GOWN STRL REUS W/ TWL XL LVL3 (GOWN DISPOSABLE) ×2 IMPLANT
GOWN STRL REUS W/TWL LRG LVL3 (GOWN DISPOSABLE) ×6
GOWN STRL REUS W/TWL XL LVL3 (GOWN DISPOSABLE) ×4
HEAD CUTTING 'VALVULOTOME URSL (MISCELLANEOUS) ×1 IMPLANT
IV NS 500ML (IV SOLUTION) ×2
IV NS 500ML BAXH (IV SOLUTION) ×1 IMPLANT
KIT RM TURNOVER STRD PROC AR (KITS) ×3 IMPLANT
LABEL OR SOLS (LABEL) ×3 IMPLANT
LOOP RED MAXI  1X406MM (MISCELLANEOUS) ×8
LOOP VESSEL MAXI 1X406 RED (MISCELLANEOUS) ×4 IMPLANT
LOOP VESSEL MINI 0.8X406 BLUE (MISCELLANEOUS) ×3 IMPLANT
LOOPS BLUE MINI 0.8X406MM (MISCELLANEOUS) ×6
NEEDLE FILTER BLUNT 18X 1/2SAF (NEEDLE) ×2
NEEDLE FILTER BLUNT 18X1 1/2 (NEEDLE) ×1 IMPLANT
NS IRRIG 1000ML POUR BTL (IV SOLUTION) ×3 IMPLANT
PACK BASIN MAJOR ARMC (MISCELLANEOUS) ×3 IMPLANT
PACK UNIVERSAL (MISCELLANEOUS) ×3 IMPLANT
PAD PREP 24X41 OB/GYN DISP (PERSONAL CARE ITEMS) IMPLANT
PATCH CAROTID ECM VASC 1X10 (Prosthesis & Implant Heart) ×3 IMPLANT
PENCIL ELECTRO HAND CTR (MISCELLANEOUS) ×3 IMPLANT
SPONGE LAP 18X18 5 PK (GAUZE/BANDAGES/DRESSINGS) ×6 IMPLANT
SPONGE XRAY 4X4 16PLY STRL (MISCELLANEOUS) IMPLANT
STAPLER SKIN PROX 35W (STAPLE) ×6 IMPLANT
SUT ETHIBOND CT1 BRD #0 30IN (SUTURE) IMPLANT
SUT MNCRL+ 5-0 UNDYED PC-3 (SUTURE) ×1 IMPLANT
SUT MONOCRYL 5-0 (SUTURE) ×2
SUT PROLENE 3 0 SH DA (SUTURE) ×3 IMPLANT
SUT PROLENE 5 0 RB 1 DA (SUTURE) IMPLANT
SUT PROLENE 5 0 RB 2 (SUTURE) ×3 IMPLANT
SUT PROLENE 6 0 BV (SUTURE) ×33 IMPLANT
SUT PROLENE 7 0 BV 1 (SUTURE) ×12 IMPLANT
SUT SILK 2 0 (SUTURE) ×2
SUT SILK 2 0 SH (SUTURE) ×3 IMPLANT
SUT SILK 2-0 18XBRD TIE 12 (SUTURE) ×1 IMPLANT
SUT SILK 3 0 (SUTURE) ×6
SUT SILK 3-0 18XBRD TIE 12 (SUTURE) ×3 IMPLANT
SUT SILK 4 0 (SUTURE) ×2
SUT SILK 4-0 18XBRD TIE 12 (SUTURE) ×1 IMPLANT
SUT VIC AB 2-0 CT1 (SUTURE) ×15 IMPLANT
SUT VIC AB 2-0 SH 27 (SUTURE)
SUT VIC AB 2-0 SH 27XBRD (SUTURE) IMPLANT
SUT VIC AB 3-0 SH 27 (SUTURE) ×2
SUT VIC AB 3-0 SH 27X BRD (SUTURE) ×1 IMPLANT
SUT VICRYL+ 3-0 36IN CT-1 (SUTURE) ×6 IMPLANT
SYR 20CC LL (SYRINGE) ×3 IMPLANT
SYR 3ML LL SCALE MARK (SYRINGE) ×3 IMPLANT
TAPE UMBIL 1/8X18 RADIOPA (MISCELLANEOUS) ×3 IMPLANT
TOWEL OR 17X26 4PK STRL BLUE (TOWEL DISPOSABLE) ×3 IMPLANT
TRAY FOLEY W/METER SILVER 16FR (SET/KITS/TRAYS/PACK) ×3 IMPLANT
VALVULOTOME URESIL (MISCELLANEOUS) ×3
VCP944 ×6 IMPLANT

## 2017-06-19 NOTE — Op Note (Signed)
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  Right femoral artery to tibioperoneal artery bypass with in situ saphenous vein graft 2.  Right common femoral artery, profunda femoris artery, and superficial femoral artery endarterectomies with removal of previously placed stent in the proximal right superficial femoral artery and separate core matrix patch angioplasty of the profunda femoris artery 3.  Right tibioperoneal trunk and peroneal artery endarterectomy  SURGEON: [name], MD and [name], MD - cosurgeons  ASSISTANT(S): none  ANESTHESIA: general  ESTIMATED BLOOD LOSS: 300 cc  FINDING(S): Severe occlusive disease on multiple levels  SPECIMEN(S): Plaque from the common femoral artery, profunda femoris artery, and the superficial femoral artery as well as the previously placed stent in the right superficial femoral artery Plaque from the tibioperoneal trunk and peroneal artery  INDICATIONS:   Ricardo Folks Sr. is a 61 y.o. male who presents with rest pain of the right foot and failure of his previous percutaneous interventions.  The patient required surgical bypass for revascularization.  Risks and benefits were discussed including but not limited to bleeding, infection, thrombosis, limb loss, injury to nearby structures, cardiopulmonary complications, and death.  DESCRIPTION: After full informed written consent was obtained, the patient was brought back to the operating room and placed supine upon the operating table.  Prior to induction, the patient was given intravenous antibiotics.  After obtaining adequate anesthesia, the patient was prepped and draped in the standard fashion for a femoral to popliteal bypass operation.  Attention was turned to the right  groin.  A longitudinal incision was made over the right common femoral artery.  Using blunt dissection and electrocautery, the artery was dissected out from the inguinal ligament down to the femoral bifurcation.  The superficial femoral artery, profunda femoral artery, and external iliac artery were dissected out and vessel loops applied.  Circumflex branches were also dissected and controlled with vessel loops.  This common femoral artery was found on exam to be calcified and there was some thick fibroplasia in the area.  The stent in the proximal superficial femoral artery also abutted up to the level of the origin of the superficial femoral artery.       At this point, attention was turned to the calf.  An longitudinal incision was made one finger-width posterior to the tibia.  Using blunt dissection and electrocautery, a plane was developed through the subcutaneous tissue and fascia down to the popliteal space.  The popliteal vein was dissected out and retracted medially and posteriorly.  The below-the-knee popliteal artery was dissected away from the popliteal vein.   At this point, the patient's ipsilateral, right greater saphenous vein was dissected out for harvest for conduit.  Skip incisions was made over the greater saphenous vein from the saphenofemoral junction down to mid calf.  The vein conduit was found to be adequate for bypass conduit.   At this point, the patient was given 5000 units of Heparin intravenously.  An additional 2000 units of heparin were given later in the procedure.  After waiting three minutes, the external iliac artery, superficial femoral artery and profunda femoral artery were clamped.  An incision was made in the common femoral artery and extended proximally and distally with a Potts scissor.  This had to be taken down to the proximal superficial femoral artery  to allow the proximal bypass anastomosis to be created.  The location of the saphenofemoral junction required  placement of the bypass and the distal common femoral artery and proximal SFA.  The profunda femoris artery was quite lateral, and was not in a good orientation for an in situ saphenous vein bypass. There was significant femoral disease that had to be addressed before proceeding with bypass.  The elevator was used to create a plane to remove the thick plaque from the common femoral artery.  As we had to extend the arteriotomy down into the proximal superficial femoral artery, the fibrous plaque associated with the stent was then removed and sent for a specimen as well in the superficial femoral artery.  Proximal portion of the stent had to be removed to allow proximal anastomosis and the stent was taken out of the proximal SFA with gentle traction.  All loose flecks were removed and the elevator was dissection down into the proximal profunda femoris artery about 1 cm gentle traction was done and good backbleeding was seen from the profunda femoris artery.  The vein was taken off of the saphenofemoral junction after clamping the junction.  The junction was closed with two layers of 5-0 Prolene suture.  The proximal conduit was cut and bevelled to match the arteriotomy.  Again, due to the location of the in situ vein bypass, the anastomosis was going to be created to the distal common femoral artery and proximal superficial femoral artery to allow a tension-free anastomosis.  The conduit was sewn to the common femoral artery with a running stitch of 6-0 Prolene.  Prior to completing this anastomosis, all vessels were backbled.  No thrombus was noted from any vessels and backbleeding was good.  The anastomosis was completed in the usual fashion.  We then passed the valvulotome to remove all valves and allow for flow of blood through the vein graft.  3 passes with the Urseal valvulotome were made, and brisk pulsatile inflow was then seen.  Attention was then turned to the popliteal exposure.   We reset the  exposure of the popliteal space.  I verified the popliteal artery was appropriately marked.  We determined the target segment for the anastomosis.  The saphenous vein was controlled with.  I made an incision with a 11-blade in the artery and extended it proximally and distally with a Potts scissor.  Unfortunately, the popliteal artery in the below-knee location was found to be inadequate for distal bypass anastomosis as there was a thick plaque and essentially no lumen seen for distal anastomosis.  The popliteal arteriotomy was then oversewn with 5-0 Prolene suture.  At this point, we carried our dissection down to the tibial trifurcation is seen previously on the area he essentially had a true trifurcation with a origin of the posterior tibial arteries coming off the same area with the tibioperoneal trunk and peroneal artery continuing distally.  It was at this location and the tibioperoneal trunk and proximal peroneal artery that we dissected down and prepare the artery for distal bypass anastomosis.  All 3 tibial vessels were controlled with Vesseloops and an anterior wall arteriotomy was created with an 11 blade and extended with Potts scissors.  There was still a thick, dense atherosclerotic plaque in the tibioperoneal trunk and proximal peroneal artery and we elected to perform an endarterectomy in this location to facilitate the distal bypass anastomosis.  The freer elevator was used to create a plane.  The proximal endpoint was pulled with hemostats  several centimeters proximal to the anastomosis.  The distal endpoint was created with nice gentle traction down to the peroneal artery and the plaque was sent off for specimen.  At this location, there was backbleeding from all 3 tibial vessels and the vessel was locally heparinized. The distal vein had been ligated with a 2-0 silk tie with the valvulotome. I pulled the conduit to appropriate tension and length, taking into account straightening out the leg.  I  adjusted the length of the conduit sharply.  We spatulated this conduit to meet the dimensions of the arteriotomy.  The conduit was sewn to the tibioperoneal trunk and proximal peroneal artery with a running stitch of 6-0 Prolene.  Prior to completing this anastomosis, all vessels were backbled and flushing maneuvers were performed. No thrombus was noted from any vessels and backbleeding was good.  The bypass conduit was allowed to bleed in an antegrade fashion with excellent pulsatile flow.  The anastomosis was completed in the usual fashion.  A separate incision in the medial mid thigh was then performed and 2 medium to large saphenous vein branches were ligated and divided between silk ties to avoid large AV fistula creation using the Doppler, and care was taken that no other large branches would be present that would threaten the bypass.  The Doppler was used to interrogate the arteries distal to the anastomosis with good triphasic signal seen as well as within unfortunately, the profunda femoris although it had a proximal pulse had weak signals beyond the proximal segment.  We then dissected out the profunda femoris artery down to the 3 primary branches and controlled these with Vesseloops.  The proximal profunda femoris artery was then clamped with a profunda clamp.  The bypass remained open and was providing flow to the foot.  An arteriotomy was then created in the profunda femoris artery on the anterior wall.  A thick plaque was seen with a shelf that was certainly limiting blood flow and explain the poor distal signals.  An endarterectomy was then performed with the freer elevator and the profunda femoris artery.  This was taken down to the primary branches the profunda femoris artery and then cut flush with tenotomy scissors.  All loose flecks were removed.  A core matrix patch was then used to patch the anterior wall of the profunda femoris artery with continuous suture of 6-0 Prolene in the usual  fashion started first proximally and then a second suture started at the distal endpoint.  The vessel was flushed in the air prior to release control and on release control there was an excellent pulse the primary branches of the profunda femoris artery as well as triphasic Doppler signals present. At this point, all incisions were washed out and Surgicel and Evicel were placed into both incisions.    At this point, bleeding in both incisions were controlled with electrocautery and suture ligature.  The calf incision was closed with interrupted deep sutures to reapproximate the deep muscles and a running stitch of 3-0 Vicryl in the subcutaneous tissue.  The skin was reapproximated with staples. .  Attention was turned to the groin.  The groin was repaired with a double layer of 2-0 Vicryl immediately superficial to the bypass conduit.  The superficial subcutaneous tissue was reapproximated with two layers of 3-0 Vicryl.  The skin was reapproximated with a running subcuticular of 4-0 Monocryl.  The skin was cleaned, dried, and reinforced with Dermabond.  The vein harvest incisions were closed with a layer  of 3-0 Vicryl in the subcutaneous tissue.  The skin was then reapproximated with staples.  The skin was cleaned, dried, and sterile bandages applied.   The patient was then awakened from anesthesia and taken to the recovery room in stable condition having tolerated the procedure well.     COMPLICATIONS: none  CONDITION: stable   [name] 06/19/2017 6:49 PM   This note was created with Dragon Medical transcription system. Any errors in dictation are purely unintentional.

## 2017-06-19 NOTE — Anesthesia Procedure Notes (Signed)
Procedure Name: Intubation Performed by: Hedda Slade Pre-anesthesia Checklist: Patient identified, Patient being monitored, Timeout performed, Emergency Drugs available and Suction available Patient Re-evaluated:Patient Re-evaluated prior to induction Oxygen Delivery Method: Circle system utilized Preoxygenation: Pre-oxygenation with 100% oxygen Induction Type: IV induction Ventilation: Mask ventilation without difficulty Laryngoscope Size: Miller and 2 Grade View: Grade I Tube type: Oral Tube size: 7.5 mm Number of attempts: 1 Airway Equipment and Method: Stylet Placement Confirmation: ETT inserted through vocal cords under direct vision,  positive ETCO2 and breath sounds checked- equal and bilateral Secured at: 22 cm Tube secured with: Tape Dental Injury: Teeth and Oropharynx as per pre-operative assessment

## 2017-06-19 NOTE — Anesthesia Post-op Follow-up Note (Signed)
Anesthesia QCDR form completed.        

## 2017-06-19 NOTE — Op Note (Signed)
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  Right femoral artery to tibioperoneal artery bypass with in-situ saphenous vein graft 2.  Right common femoral endarterectomy 3.  Right profunda femoris endarterectomy with core matrix patch angioplasty 4.  Right tibioperoneal trunk and peroneal artery endarterectomy 5.  Right proximal SFA endarterectomy and removal of previously placed stent  CO-SURGEONS: [name], MD and [name], MD   ANESTHESIA: general  ESTIMATED BLOOD LOSS: 300 cc  FINDING(S): Severe occlusive calcific atherosclerotic plaque multiple levels  SPECIMEN(S): Here on seen next week going to learn about fistulousPlaque from the common femoral, profunda femoris, tibioperoneal trunk and peroneal as well as plaque associated with a intravascular stent from the SFA   INDICATIONS:   Ricardo Folks Sr. is a 61 y.o. male who presents with atherosclerotic occlusive disease of the right lower extremity associated with rest pain.  The patient required surgical bypass for revascularization.  Risks and benefits were discussed including but not limited to bleeding, infection, thrombosis, limb loss, injury to nearby structures, cardiopulmonary complications, and death.  DESCRIPTION: After full informed written consent was obtained, the patient was brought back to the operating room and placed supine upon the operating table.  Prior to induction, the patient was given intravenous antibiotics.  After obtaining adequate anesthesia, the patient was prepped and draped in the standard fashion for a femoral to popliteal bypass operation.  Attention was turned to the right groin.  A longitudinal incision was made over the right common femoral artery.   Using blunt dissection and electrocautery, the artery was dissected out from the inguinal ligament down to the femoral bifurcation.  The superficial femoral artery, profunda femoral artery, and external iliac artery were dissected out and vessel loops applied.  Circumflex branches were also dissected and controlled with vessel loops.  This common femoral artery was minimally pulsatile on exam and found to be calcified.    Working from the vertical groin incision the common femoral vein was identified and explored to identify the saphenofemoral femoral junction.  Saphenofemoral junction was then skeletonized with tributaries ligated between 3-0 silk ties and then divided.  The great saphenous vein itself was dissected for a distance of approximately 4-5 cm distally.  At this point, attention was turned to the calf.  An longitudinal incision was made posterior to the tibia.  The skin incision was made carefully and dissection of the subcutaneous tissues was performed to identify the great saphenous vein.  Great saphenous vein was then dissected circumferentially across the length of this incision ligating and dividing tributaries between 4-0 and 3-0 silk ties.  The great saphenous vein was then reflected posteriorly and using blunt dissection and electrocautery, a plane was developed through the subcutaneous tissue and fascia down to the popliteal space.  The popliteal vein was dissected out and retracted medially and posteriorly.  The below-the-knee popliteal artery was dissected away from the popliteal vein.  This popliteal artery was found to be somewhat sclerotic and of questionable suitability as a target for the distal bypass anastomosis.      The vein conduit was found to be adequate for bypass conduit.  The great saphenous vein in the calf area was dissected down beyond the site of planned distal anastomosis to allow it to swing over and perform an anastomosis  without tension.  At the end of this  process, I felt the conduit to be adequate in quality and size for use.  At this point, the patient was given 5000 units of Heparin intravenously.  After waiting three minutes, the external iliac artery, superficial femoral artery and profunda femoral artery were clamped.  An incision was made in the common femoral artery and extended proximally and distally with a Potts scissor.  Endarterectomy of the common femoral artery was then performed using a freer elevator the plaque was removed all the way up to the clamp at the level of the circumflex vessels.  Endarterectomy was then performed of the profunda femoris using the eversion technique.  Looking at the saphenofemoral junction and where the vein graft would lie when rotated over to the arterial side it was clear that the previously placed SFA stent would be problematic and therefore eversion endarterectomy of the SFA with extraction of the most proximal stent was performed so that there would be no interference with the bypass by ostial plaque or the struts from a stent.  Once this area had been completely cleaned attention was returned to the saphenofemoral junction.  The vein was taken off of the saphenofemoral junction after clamping the junction.  The junction was closed with two layers of 5-0 Prolene suture.  The proximal great saphenous vein was cut and bevelled to match the arteriotomy.  The conduit was sewn to the common femoral artery with a running stitch of 6-0 Prolene.  Prior to completing this anastomosis, all vessels were backbled.  No thrombus was noted from any vessels and backbleeding was good.  The anastomosis was completed in the usual fashion.  The saphenous vein was then marked distally at the calf incision and ligated with a 2-0 silk and then transected.  A urocele valvulotome was then advanced through the saphenous vein from the open distal and up to the anastomosis in the common femoral artery.  Positioning of the valvulotome was  verified by palpation.  A total of 3 passes were made using the small valvulotome and then the larger valvulotome.  After the third pass strongly pulsatile blood flow is noted out the end of the vein.  The vein is controlled with a vascular clamp.  He has  Attention was then turned to the popliteal exposure.   I reset the exposure of the popliteal space.  I verified the popliteal artery was appropriately marked.  I determine the target segment for the anastomosis.  I applied tension to control the artery proximally and distally with vessel loops.  I made an incision in the distal popliteal artery with a 11-blade in the artery and extended it proximally and distally with a Potts scissor.  However, the popliteal artery appeared to be quite sclerotic with a poor lumen and would not be adequate as the distal target for the bypass.  Therefore, the arterial dissection was carried more distally to identify the anterior tibial and posterior tibial arteries.  The peroneal anterior tibial posterior tibial and a large collateral branch were all looped with Silastic Vesseloops.  Arteriotomy was made with an 11 blade scalpel in the tibioperoneal trunk and extended down onto the peroneal.  A large bulky plaque was noted at this level and endarterectomy of the tibioperoneal trunk and proximal peroneal was performed under direct visualization.  Freer elevator was used and a feathered edge was obtained within the peroneal.  The site was irrigated with heparinized saline.  The leg was then straightened  and the conduit was measured to the appropriate tension and length.  WhatI spatulated this conduit to meet the dimensions of the arteriotomy.  The conduit was sewn to the popliteal artery with a running stitch of 6-0 Prolene.  Prior to completing this anastomosis, all vessels were backbled. No thrombus was noted from any vessels and backbleeding was good.  The bypass conduit was allowed to bleed in an antegrade fashion with  excellent pulsatile flow.  The anastomosis was completed in the usual fashion.  Interrogation with the Doppler demonstrated a triphasic signal both within the vein graft as well as in the peroneal and tibial arteries.  However, interrogation of the profunda femoris with the Doppler demonstrated a static signal.  Therefore the profunda femoris was now dissected down to the fourth order branches all branches were looped with blue Silastic Vesseloops.  Arteriotomy was then performed and extended with Potts scissors large bulky occlusive plaque was noted within the profunda femoris is main trunk.  This was then endarterectomized under direct visualization obtaining feathered edges in both main branches.  Core matrix patch was then delivered onto the field beveled and applied to repair the profunda femoris artery using running 6-0 Prolene.  Flushing maneuvers were performed and flow was reestablished of the profunda.  Interrogation with the Doppler now demonstrated triphasic signals in all 3 main branches of the profunda femoris.  Linear incision was made in the medial thigh where the great saphenous vein was not explored several large branches were identified and these were ligated to prevent AV fistulas.  At this point, all incisions were washed out and Surgicel and Evicel were placed into both incisions.    At this point, bleeding in both incisions were controlled with electrocautery and suture ligature.  The calf incision was closed with interrupted deep sutures to reapproximate the deep muscles and a running stitch of 3-0 Vicryl in the subcutaneous tissue.  The skin was reapproximated with staples. .  Attention was turned to the groin.  The groin was repaired with a double layer of 2-0 Vicryl immediately superficial to the bypass conduit.  The superficial subcutaneous tissue was reapproximated with two layers of 3-0 Vicryl.  The skin was reapproximated with a running subcuticular of 4-0 Monocryl.  The skin  was cleaned, dried, and reinforced with Dermabond.  The vein harvest incisions were closed with a layer of 3-0 Vicryl in the subcutaneous tissue.  The skin was then reapproximated with staples.  The skin was cleaned, dried, and sterile bandages applied.   The patient was then awakened from anesthesia and taken to the recovery room in stable condition having tolerated the procedure well.     COMPLICATIONS: none  CONDITION: stable   [name] 06/19/2017 5:25 PM   This note was created with Dragon Medical transcription system. Any errors in dictation are purely unintentional.

## 2017-06-19 NOTE — Anesthesia Preprocedure Evaluation (Addendum)
Anesthesia Evaluation  Patient identified by MRN, date of birth, ID band Patient awake    Reviewed: Allergy & Precautions, NPO status , Patient's Chart, lab work & pertinent test results, reviewed documented beta blocker date and time   History of Anesthesia Complications (+) history of anesthetic complications  Airway Mallampati: III  TM Distance: >3 FB     Dental  (+) Chipped, Upper Dentures, Partial Lower   Pulmonary Current Smoker,           Cardiovascular + Peripheral Vascular Disease       Neuro/Psych  Headaches, PSYCHIATRIC DISORDERS Depression Bipolar Disorder    GI/Hepatic GERD  Controlled,(+) Hepatitis -, C  Endo/Other    Renal/GU      Musculoskeletal  (+) Arthritis , Rheumatoid disorders,    Abdominal   Peds  Hematology   Anesthesia Other Findings Cervical fusion and RA.  Reproductive/Obstetrics                            Anesthesia Physical Anesthesia Plan  ASA: III  Anesthesia Plan: General   Post-op Pain Management:    Induction: Intravenous  PONV Risk Score and Plan:   Airway Management Planned: Oral ETT  Additional Equipment:   Intra-op Plan:   Post-operative Plan:   Informed Consent: I have reviewed the patients History and Physical, chart, labs and discussed the procedure including the risks, benefits and alternatives for the proposed anesthesia with the patient or authorized representative who has indicated his/her understanding and acceptance.     Plan Discussed with: CRNA  Anesthesia Plan Comments:         Anesthesia Quick Evaluation

## 2017-06-19 NOTE — Transfer of Care (Signed)
Immediate Anesthesia Transfer of Care Note  Patient: Ricardo Folks Sr.  Procedure(s) Performed: BYPASS GRAFT FEMORAL-POPLITEAL ARTERY (Right )  Patient Location: PACU  Anesthesia Type:General  Level of Consciousness: awake, alert  and oriented  Airway & Oxygen Therapy: Patient Spontanous Breathing and Patient connected to face mask oxygen  Post-op Assessment: Report given to RN and Post -op Vital signs reviewed and stable  Post vital signs: Reviewed and stable  Last Vitals:  Vitals:   06/19/17 0918  BP: (!) 129/93  Pulse: 78  Resp: 18  Temp: 36.6 C  SpO2: 99%    Last Pain:  Vitals:   06/19/17 0918  TempSrc: Oral  PainSc: 4          Complications: No apparent anesthesia complications

## 2017-06-19 NOTE — H&P (Signed)
[facility] Admission History & Physical  MRN : [id number]  Ricardo MANGUAL Sr. is a 61 y.o. ([birth year]) male who presents with chief complaint of No chief complaint on file. Ricardo Cervantes  History of Present Illness: The patient is a 61 year old gentleman with right lower extremity rest pain.  The patient is well-known to our practice.  In the past he is undergone several successful interventions for his right SFA occlusion.  At the conclusion of these.  He had a palpable pedal pulse.  Unfortunately these have not proven to be durable and in fairly short order he returns with recurrence of his symptoms and is found to have occlusion of his previously placed stents.  His wrist pain has been increasing.  He also is unable to walk far enough and is in danger of losing his job.  Therefore we are moving forward with bypass surgery.  There is been no significant changes to the patient's overall health.  Patient denies amaurosis fugax or recent TIA symptoms.  No history of CVA. Patient denies recent episodes of angina or shortness of breath.  He denies chest pain.  Current Facility-Administered Medications  Medication Dose Route Frequency Provider Last Rate Last Dose  . ceFAZolin (ANCEF) 2-4 GM/100ML-% IVPB           . ceFAZolin (ANCEF) IVPB 2g/100 mL premix  2 g Intravenous On Call to [place], PA-C      . Chlorhexidine Gluconate Cloth 2 % PADS 6 each  6 each Topical Once Stegmayer, Kimberly A, PA-C       And  . Chlorhexidine Gluconate Cloth 2 % PADS 6 each  6 each Topical Once Stegmayer, Kimberly A, PA-C      . lactated ringers infusion   Intravenous Continuous Alvin Critchley, MD 100 mL/hr at 06/19/17 0939    . lactated ringers infusion   Intravenous Continuous Alvin Critchley, MD        Past Medical History:  Diagnosis Date  . Arthritis    Rheumatoid  . Bipolar disorder ([place])   . Complication of anesthesia    difficulty keeping patient asleep. fentanyl makes  patient mean, grumpy and difficult  . Depression   . Fusion of spine   . GERD (gastroesophageal reflux disease)    slightly uncomfortable. takes otc antacids occasionally  . Headache    deteriorating discs causing headaches. uses advil  . Hepatitis C    HEP "C". treated 2 years ago  . History of kidney stones 2016  . Hyperlipidemia   . Pericarditis    20 years ago  . Peripheral vascular disease ([place])   . Smoker     Past Surgical History:  Procedure Laterality Date  . ABDOMINAL AORTOGRAM W/LOWER EXTREMITY N/A 01/15/2017   Procedure: Abdominal Aortogram w/Lower Extremity;  Surgeon: Katha Cabal, MD;  Location: [facility];  Service: Cardiovascular;  Laterality: N/A;  . BACK SURGERY  2004   acdf, titanium c5-6  . LOWER EXTREMITY ANGIOGRAPHY Right 01/15/2017   Procedure: Lower Extremity Angiography;  Surgeon: Katha Cabal, MD;  Location: [facility];  Service: Cardiovascular;  Laterality: Right;  . LOWER EXTREMITY ANGIOGRAPHY Right 02/13/2017   Procedure: Lower Extremity Angiography;  Surgeon: Katha Cabal, MD;  Location: [facility];  Service: Cardiovascular;  Laterality: Right;    Social History Social History  Substance Use Topics  . Smoking status: Current Every Day Smoker    Packs/day: 0.75    Years:  40.00    Types: Cigarettes  . Smokeless tobacco: Never Used  . Alcohol use 1.2 oz/week    2 Shots of liquor per week     Comment: occasional (every 3-4 weeks)     Family History Family History  Problem Relation Age of Onset  . Cancer Mother        liver  . Heart disease Father   . Diabetes Father   . Breast cancer Sister   . Brain cancer Sister   No family history of bleeding/clotting disorders, porphyria or autoimmune disease   No Known Allergies   REVIEW OF SYSTEMS (Negative unless checked)  Constitutional: [] Weight loss  [] Fever  [] Chills Cardiac: [] Chest pain   [] Chest pressure   [] Palpitations   [] Shortness of  breath when laying flat   [] Shortness of breath at rest   [] Shortness of breath with exertion. Vascular:  [x] Pain in legs with walking   [x] Pain in legs at rest   [] Pain in legs when laying flat   [] Claudication   [] Pain in feet when walking  [] Pain in feet at rest  [] Pain in feet when laying flat   [] History of DVT   [] Phlebitis   [] Swelling in legs   [] Varicose veins   [] Non-healing ulcers Pulmonary:   [] Uses home oxygen   [] Productive cough   [] Hemoptysis   [] Wheeze  [] COPD   [] Asthma Neurologic:  [] Dizziness  [] Blackouts   [] Seizures   [] History of stroke   [] History of TIA  [] Aphasia   [] Temporary blindness   [] Dysphagia   [] Weakness or numbness in arms   [] Weakness or numbness in legs Musculoskeletal:  [] Arthritis   [] Joint swelling   [] Joint pain   [] Low back pain Hematologic:  [] Easy bruising  [] Easy bleeding   [] Hypercoagulable state   [] Anemic  [] Hepatitis Gastrointestinal:  [] Blood in stool   [] Vomiting blood  [] Gastroesophageal reflux/heartburn   [] Difficulty swallowing. Genitourinary:  [] Chronic kidney disease   [] Difficult urination  [] Frequent urination  [] Burning with urination   [] Blood in urine Skin:  [] Rashes   [] Ulcers   [] Wounds Psychological:  [] History of anxiety   []  History of major depression.  Physical Examination  Vitals:   06/19/17 0918  BP: (!) 129/93  Pulse: 78  Resp: 18  Temp: 97.9 F (36.6 C)  TempSrc: Oral  SpO2: 99%   There is no height or weight on file to calculate BMI. Gen: WD/WN, NAD Head: [place]/AT, No temporalis wasting. Prominent temp pulse not noted. Ear/Nose/Throat: Hearing grossly intact, nares w/o erythema or drainage, oropharynx w/o Erythema/Exudate,  Eyes: Conjunctiva clear, sclera non-icteric Neck: Trachea midline.  No JVD.  Pulmonary:  Good air movement, respirations not labored, no use of accessory muscles.  Cardiac: RRR, normal S1, S2. Vascular: Right foot is cool to the touch with sluggish capillary refill Vessel Right Left  Radial  Palpable Palpable  Popliteal  not palpable Palpable  PT  not palpable Palpable  DP  not palpable Palpable  Gastrointestinal: soft, non-tender/non-distended. No guarding/reflex.  Musculoskeletal: M/S 5/5 throughout.  Extremities without ischemic changes.  No deformity or atrophy.  Neurologic: Sensation grossly intact in extremities.  Symmetrical.  Speech is fluent. Motor exam as listed above. Psychiatric: Judgment intact, Mood & affect appropriate for pt's clinical situation. Dermatologic: No rashes or ulcers noted.  No cellulitis or open wounds. Lymph : No Cervical, Axillary, or Inguinal lymphadenopathy.     CBC Lab Results  Component Value Date   WBC 6.5 06/13/2017   HGB 16.6 06/13/2017  HCT 48.9 06/13/2017   MCV 92.6 06/13/2017   PLT 154 06/13/2017    BMET    Component Value Date/Time   NA 136 06/13/2017 1502   NA 139 12/23/2015 1606   K 4.0 06/13/2017 1502   CL 95 (L) 06/13/2017 1502   CO2 31 06/13/2017 1502   GLUCOSE 97 06/13/2017 1502   BUN 31 (H) 06/13/2017 1502   BUN 15 12/23/2015 1606   CREATININE 1.17 06/13/2017 1502   CALCIUM 10.1 06/13/2017 1502   GFRNONAA >60 06/13/2017 1502   GFRAA >60 06/13/2017 1502   Estimated Creatinine Clearance: 78.5 mL/min (by C-G formula based on SCr of 1.17 mg/dL).  COAG Lab Results  Component Value Date   INR 1.10 06/13/2017    Radiology No results found.   Assessment/Plan 1. Atherosclerosis of native artery of right lower extremity with rest pain (HCC)  Recommend:  The patient has evidence of severe atherosclerotic changes of the right lower extremity associated with rest pain.  This represents a limb threatening ischemia and places the patient at the risk for limb loss.  Angiography has been performed and the situation is not ideal for intervention.  Given this finding open surgical repair is recommended.   Patient should undergo arterial reconstruction of the lower extremity with the hope for limb salvage.  The  risks and benefits as well as the alternative therapies was discussed in detail with the patient.  All questions were answered.  Patient agrees to proceed with bypass surgery.  The patient will follow up with me in the office after the procedure.    2. History of hepatitis C Continue antiviral medications as needed  3. Primary hypercoagulable state ([place]) Continue anticoagulation for now  4. Pain of right lower extremity See #1  5. Tobacco abuse counseling The need for smoking cessation was stressed     [name], MD  06/19/2017 10:54 AM

## 2017-06-20 ENCOUNTER — Encounter: Payer: Self-pay | Admitting: Vascular Surgery

## 2017-06-20 LAB — BASIC METABOLIC PANEL
ANION GAP: 7 (ref 5–15)
BUN: 18 mg/dL (ref 6–20)
CHLORIDE: 103 mmol/L (ref 101–111)
CO2: 22 mmol/L (ref 22–32)
CREATININE: 0.85 mg/dL (ref 0.61–1.24)
Calcium: 8 mg/dL — ABNORMAL LOW (ref 8.9–10.3)
GFR calc non Af Amer: >60 mL/min (ref >60)
Glucose, Bld: 186 mg/dL — ABNORMAL HIGH (ref 65–99)
Potassium: 4.2 mmol/L (ref 3.5–5.1)
SODIUM: 132 mmol/L — AB (ref 135–145)

## 2017-06-20 LAB — CBC
HEMATOCRIT: 40.3 % (ref 40.0–52.0)
HEMOGLOBIN: 13.5 g/dL (ref 13.0–18.0)
MCH: 31.6 pg (ref 26.0–34.0)
MCHC: 33.5 g/dL (ref 32.0–36.0)
MCV: 94.3 fL (ref 80.0–100.0)
Platelets: 148 10*3/uL — ABNORMAL LOW (ref 150–440)
RBC: 4.27 MIL/uL — ABNORMAL LOW (ref 4.40–5.90)
RDW: 14.2 % (ref 11.5–14.5)
WBC: 9.8 10*3/uL (ref 3.8–10.6)

## 2017-06-20 NOTE — Evaluation (Signed)
Physical Therapy Evaluation Patient Details Name: Ricardo BERNASCONI Sr. MRN: [id number] DOB: June 01, 1956 Today's Date: 06/20/2017   History of Present Illness  61 y/o male POD #1 s/p right fem distal, endarterectomies.    Clinical Impression  Pt is able to ambulate well with slight limp on the R, mostly having issues with R groin but able to ambulate 150 ft despite this.  He did not have any LOBs, safety issues and though is O2 remained in the 90s it did drop with the activity of ambulation.  He showed good effort and is eager to get out of the hospital and get home.     Follow Up Recommendations No PT follow up    Equipment Recommendations       Recommendations for Other Services       Precautions / Restrictions Precautions Precautions: Fall Restrictions Weight Bearing Restrictions: No      Mobility  Bed Mobility Overal bed mobility: Independent                Transfers Overall transfer level: Independent Equipment used: None             General transfer comment: Pt intially had hard time fully straightening up secondary to groin pain.  able to rise/maintain balance w/o assist  Ambulation/Gait Ambulation/Gait assistance: Supervision Ambulation Distance (Feet): 150 Feet Assistive device: None       General Gait Details: Pt with hesitant R LE use, but was able to ambulate and maintain balance safety and w/o issue.  Overall he did well, showed good effort and mobility.  He had slight limp but was safe and should be able to handle in-home distances w/o issue.   Stairs            Wheelchair Mobility    Modified Rankin (Stroke Patients Only)       Balance Overall balance assessment: Modified Independent                                           Pertinent Vitals/Pain Pain Assessment: 0-10 Pain Score: 6  Pain Location: LE in general, mostly in the R groin    Home Living Family/patient expects to be discharged to:: Private  residence Living Arrangements: Children   Type of Home: House Home Access: Stairs to enter Entrance Stairs-Rails: None Entrance Stairs-Number of Steps: 3     Additional Comments: son is working t/o the day, pt is able to generally stay active and do all he needs    Prior Function Level of Independence: Independent         Comments: Pt is able to do all he needs w/o issue     Hand Dominance        Extremity/Trunk Assessment   Upper Extremity Assessment Upper Extremity Assessment: Overall WFL for tasks assessed    Lower Extremity Assessment Lower Extremity Assessment:  (R LE pain limited, grossly 4-/5)       Communication   Communication: No difficulties  Cognition Arousal/Alertness: Awake/alert Behavior During Therapy: WFL for tasks assessed/performed Overall Cognitive Status: Within Functional Limits for tasks assessed                                        General Comments      Exercises  Assessment/Plan    PT Assessment Patent does not need any further PT services  PT Problem List Decreased strength;Decreased range of motion;Decreased activity tolerance;Decreased balance;Decreased mobility;Decreased safety awareness;Pain       PT Treatment Interventions      PT Goals (Current goals can be found in the Care Plan section)  Acute Rehab PT Goals Patient Stated Goal: Go home as soon as possible. PT Goal Formulation: All assessment and education complete, DC therapy    Frequency     Barriers to discharge        Co-evaluation               AM-PAC PT "6 Clicks" Daily Activity  Outcome Measure Difficulty turning over in bed (including adjusting bedclothes, sheets and blankets)?: None Difficulty moving from lying on back to sitting on the side of the bed? : None Difficulty sitting down on and standing up from a chair with arms (e.g., wheelchair, bedside commode, etc,.)?: None Help needed moving to and from a bed to chair  (including a wheelchair)?: None Help needed walking in hospital room?: None Help needed climbing 3-5 steps with a railing? : A Little 6 Click Score: 23    End of Session Equipment Utilized During Treatment: Gait belt Activity Tolerance: Patient tolerated treatment well Patient left: with call bell/phone within reach;with nursing/sitter in room;in bed Nurse Communication: Mobility status PT Visit Diagnosis: Difficulty in walking, not elsewhere classified (R26.2);Pain Pain - Right/Left: Right Pain - part of body: Leg    Time: 1140-1201 PT Time Calculation (min) (ACUTE ONLY): 21 min   Charges:   PT Evaluation $PT Eval Low Complexity: 1 Low     PT G Codes:   PT G-Codes **NOT FOR INPATIENT CLASS** Functional Assessment Tool Used: AM-PAC 6 Clicks Basic Mobility Functional Limitation: Mobility: Walking and moving around Mobility: Walking and Moving Around Current Status (M6294): At least 1 percent but less than 20 percent impaired, limited or restricted Mobility: Walking and Moving Around Goal Status [phone]): At least 1 percent but less than 20 percent impaired, limited or restricted Mobility: Walking and Moving Around Discharge Status [phone]): At least 1 percent but less than 20 percent impaired, limited or restricted    Kreg Shropshire, DPT 06/20/2017, 3:18 PM

## 2017-06-20 NOTE — Anesthesia Postprocedure Evaluation (Signed)
Anesthesia Post Note  Patient: Ricardo Folks Sr.  Procedure(s) Performed: BYPASS GRAFT FEMORAL-POPLITEAL ARTERY (Right )  Patient location during evaluation: ICU Anesthesia Type: General Level of consciousness: awake and alert Pain management: pain level controlled Vital Signs Assessment: post-procedure vital signs reviewed and stable Respiratory status: spontaneous breathing, nonlabored ventilation, respiratory function stable and patient connected to nasal cannula oxygen Cardiovascular status: blood pressure returned to baseline and stable Postop Assessment: no apparent nausea or vomiting Anesthetic complications: no     Last Vitals:  Vitals:   06/20/17 0600 06/20/17 0800  BP: 93/64   Pulse: 63   Resp: 15   Temp:  36.6 C  SpO2: 95%     Last Pain:  Vitals:   06/20/17 0800  TempSrc: Oral  PainSc:                  Estill Batten

## 2017-06-20 NOTE — Progress Notes (Signed)
[facility]  Daily Progress Note   Subjective  - 1 Day Post-Op  Pain in the groin Otherwise doing well No events overnight  Objective Vitals:   06/20/17 0500 06/20/17 0600 06/20/17 0800 06/20/17 0900  BP: 93/64 93/64 90/63  112/70  Pulse: 68 63 60 64  Resp: 12 15 10 15   Temp:   97.9 F (36.6 C)   TempSrc:   Oral   SpO2: 93% 95% 94% 92%  Weight:      Height:        Intake/Output Summary (Last 24 hours) at 06/20/17 1058 Last data filed at 06/20/17 0600  Gross per 24 hour  Intake          3063.33 ml  Output             1635 ml  Net          1428.33 ml    PULM  CTAB CV  RRR VASC  2+ PT pulse, foot warm, all incisions C/D/I  Laboratory CBC    Component Value Date/Time   WBC 9.8 06/20/2017 0006   HGB 13.5 06/20/2017 0006   HCT 40.3 06/20/2017 0006   PLT 148 (L) 06/20/2017 0006    BMET    Component Value Date/Time   NA 132 (L) 06/20/2017 0006   NA 139 12/23/2015 1606   K 4.2 06/20/2017 0006   CL 103 06/20/2017 0006   CO2 22 06/20/2017 0006   GLUCOSE 186 (H) 06/20/2017 0006   BUN 18 06/20/2017 0006   BUN 15 12/23/2015 1606   CREATININE 0.85 06/20/2017 0006   CALCIUM 8.0 (L) 06/20/2017 0006   GFRNONAA >60 06/20/2017 0006   GFRAA >60 06/20/2017 0006    Assessment/Planning: POD #1 s/p right fem distal, endarterectomies   Doing well  To floor today  Advance diet  PT  Increase activity    [name]  06/20/2017, 10:58 AM

## 2017-06-20 NOTE — Progress Notes (Signed)
Report called to Susan,RN on 2A.

## 2017-06-20 NOTE — Progress Notes (Signed)
Accepted in transfer from [facility] to rm 256. Pt is a/o comfortable. Tele verified. Dressing clean and dry. Ped pulses good. No edema noted. vss.

## 2017-06-21 LAB — BPAM RBC
BLOOD PRODUCT EXPIRATION DATE: 201811142359
Blood Product Expiration Date: 201811142359
UNIT TYPE AND RH: 5100
Unit Type and Rh: 5100

## 2017-06-21 LAB — TYPE AND SCREEN
ABO/RH(D): A POS
Antibody Screen: NEGATIVE
UNIT DIVISION: 0
Unit division: 0

## 2017-06-21 LAB — SURGICAL PATHOLOGY

## 2017-06-21 MED ORDER — ATORVASTATIN CALCIUM 20 MG PO TABS
20.0000 mg | ORAL_TABLET | Freq: Every day | ORAL | Status: DC
  Administered 2017-06-21: 20 mg via ORAL
  Filled 2017-06-21: qty 1

## 2017-06-21 MED ORDER — FAMOTIDINE 20 MG PO TABS
20.0000 mg | ORAL_TABLET | Freq: Two times a day (BID) | ORAL | Status: DC
  Administered 2017-06-21 – 2017-06-22 (×2): 20 mg via ORAL
  Filled 2017-06-21 (×2): qty 1

## 2017-06-21 MED ORDER — APIXABAN 5 MG PO TABS
5.0000 mg | ORAL_TABLET | Freq: Two times a day (BID) | ORAL | Status: DC
  Administered 2017-06-21 – 2017-06-22 (×2): 5 mg via ORAL
  Filled 2017-06-21 (×2): qty 1

## 2017-06-21 NOTE — Progress Notes (Signed)
PHARMACIST - PHYSICIAN COMMUNICATION  DR:   Delana Meyer  CONCERNING: IV to Oral Route Change Policy  RECOMMENDATION: This patient is receiving famotidine by the intravenous route.  Based on criteria approved by the Pharmacy and Therapeutics Committee, the intravenous medication(s) is/are being converted to the equivalent oral dose form(s).   DESCRIPTION: These criteria include:  The patient is eating (either orally or via tube) and/or has been taking other orally administered medications for a least 24 hours  The patient has no evidence of active gastrointestinal bleeding or impaired GI absorption (gastrectomy, short bowel, patient on TNA or NPO).  If you have questions about this conversion, please contact the Pharmacy Department  []   [phone] )  Ricardo Cervantes [x]   [phone] )  [facility] []   [phone] )  Ricardo Cervantes []   [phone] )  [facility] []   [phone] )  [place], [facility] 06/21/2017 2:16 PM

## 2017-06-21 NOTE — Care Management Important Message (Signed)
Important Message  Patient Details  Name: Ricardo GARRAWAY Sr. MRN: [id number] Date of Birth: 07-08-1956   Medicare Important Message Given:  Yes    Beverly Sessions, RN 06/21/2017, 4:38 PM

## 2017-06-21 NOTE — Progress Notes (Signed)
[facility]  Daily Progress Note   Subjective  - 2 Days Post-Op  Doing fairly well Walking Tolerating diet. Cervantes control fair  Objective Vitals:   06/20/17 1418 06/20/17 1945 06/21/17 0416 06/21/17 0811  BP: 108/67 (!) 102/55 116/71 110/79  Pulse: 65 66 72 79  Resp: 14 18 19 16   Temp: (!) 97.5 F (36.4 C) 98.1 F (36.7 C) 98.4 F (36.9 C)   TempSrc: Oral Oral Oral   SpO2: 94% 93% 94% 96%  Weight:      Height:        Intake/Output Summary (Last 24 hours) at 06/21/17 1510 Last data filed at 06/21/17 0958  Gross per 24 hour  Intake              480 ml  Output                0 ml  Net              480 ml    PULM  CTAB CV  RRR VASC  2+ PT pulse, 1-2+ edema.  Incisions C/D/I  Laboratory CBC    Component Value Date/Time   WBC 9.8 06/20/2017 0006   HGB 13.5 06/20/2017 0006   HCT 40.3 06/20/2017 0006   PLT 148 (L) 06/20/2017 0006    BMET    Component Value Date/Time   NA 132 (L) 06/20/2017 0006   NA 139 12/23/2015 1606   K 4.2 06/20/2017 0006   CL 103 06/20/2017 0006   CO2 22 06/20/2017 0006   GLUCOSE 186 (H) 06/20/2017 0006   BUN 18 06/20/2017 0006   BUN 15 12/23/2015 1606   CREATININE 0.85 06/20/2017 0006   CALCIUM 8.0 (L) 06/20/2017 0006   GFRNONAA >60 06/20/2017 0006   GFRAA >60 06/20/2017 0006    Assessment/Planning: POD #2 s/p right fem-distal bypass, femoral endarterectomy and TP trunk endarterectomy   Overall doing well  Likely discharge home tomorrow    Ricardo Cervantes  06/21/2017, 3:10 PM

## 2017-06-22 MED ORDER — ATORVASTATIN CALCIUM 20 MG PO TABS: 20.0000 mg | ORAL_TABLET | Freq: Every day | ORAL | 3 refills | Status: DC

## 2017-06-22 MED ORDER — OXYCODONE-ACETAMINOPHEN 5-325 MG PO TABS: 1.0000 | ORAL_TABLET | ORAL | 0 refills | Status: DC | PRN

## 2017-06-22 MED ORDER — DOCUSATE SODIUM 100 MG PO CAPS: 100.0000 mg | ORAL_CAPSULE | Freq: Every day | ORAL | 0 refills | Status: DC

## 2017-06-22 NOTE — Discharge Summary (Signed)
Physician Discharge Summary  Patient ID: Ricardo Cervantes. MRN: [id number] DOB/AGE: Aug 02, 1956 62 y.o.  Admit date: 06/19/2017 Discharge date: 06/22/2017  Admission Diagnoses: Right lower extremity rest pain and atherosclerosis  Discharge Diagnoses:  Active Problems:   Atherosclerosis of artery of extremity with rest pain [facility])   Discharged Condition: good  Hospital Course: Patient admitted for elective: 1.Right femoral artery to tibioperoneal artery bypass with in situ saphenous vein graft 2.  Right common femoral artery, profunda femoris artery, and superficial femoral artery endarterectomies with removal of previously placed stent in the proximal right superficial femoral artery and separate core matrix patch angioplasty of the profunda femoris artery 3.  Right tibioperoneal trunk and peroneal artery endarterectomy  Postoperatively his pain was well controlled and he was ambulating without difficulty.   Consults: None  Significant Diagnostic Studies: none  Treatments: IV hydration and surgery: as above  Discharge Exam: Blood pressure 126/80, pulse 72, temperature 98 F (36.7 C), temperature source Oral, resp. rate 16, height 6\' 2"  (1.88 m), weight 81.5 kg (179 lb 10.8 oz), SpO2 96 %. General appearance: alert and no distress Resp: clear to auscultation bilaterally Cardio: regular rate and rhythm, S1, S2 normal, no murmur, click, rub or gallop Extremities: edema 1+. Incisions C/D/I, no erythema, appropriately tender Pulses: 2+ PT  Disposition: 01-Home or Self Care  Discharge Instructions    Call MD for:  redness, tenderness, or signs of infection (pain, swelling, bleeding, redness, odor or green/yellow discharge around incision site)    Complete by:  As directed    Call MD for:  severe or increased pain, loss or decreased feeling  in affected limb(s)    Complete by:  As directed    Call MD for:  temperature >100.5    Complete by:  As directed    Discharge  instructions    Complete by:  As directed    Driving Restrictions    Complete by:  As directed    No driving for 4 weeks   Increase activity slowly    Complete by:  As directed    Walk with assistance use walker or cane as needed   Lifting restrictions    Complete by:  As directed    No heavy lifting for 4 weeks   Remove dressing in 48 hours    Complete by:  As directed    Resume previous diet    Complete by:  As directed    may wash over wound with mild soap and water    Complete by:  As directed    May shower once dressing removed     Allergies as of 06/22/2017   No Known Allergies     Medication List    STOP taking these medications   metoprolol tartrate 25 MG tablet Commonly known as:  LOPRESSOR     TAKE these medications   ADVIL 200 MG tablet Generic drug:  ibuprofen Take 800 mg by mouth every 8 (eight) hours as needed for headache or moderate pain.   apixaban 5 MG Tabs tablet Commonly known as:  ELIQUIS Take 1 tablet (5 mg total) by mouth 2 (two) times daily.   aspirin EC 81 MG tablet Take 81 mg by mouth daily.   atorvastatin 20 MG tablet Commonly known as:  LIPITOR Take 1 tablet (20 mg total) by mouth daily at 6 PM.   cyanocobalamin 500 MCG tablet Take 1 tablet (500 mcg total) by mouth daily.   docusate sodium 100 MG capsule Commonly  known as:  COLACE Take 1 capsule (100 mg total) by mouth daily.   folic acid 1 MG tablet Commonly known as:  FOLVITE Take 1 tablet (1 mg total) by mouth daily.   metoprolol succinate 25 MG 24 hr tablet Commonly known as:  TOPROL-XL Take 25 mg by mouth daily.   oxyCODONE-acetaminophen 5-325 MG tablet Commonly known as:  PERCOCET/ROXICET Take 1-2 tablets by mouth every 4 (four) hours as needed for moderate pain.   pyridOXINE 25 MG tablet Commonly known as:  VITAMIN B-6 Take 1 tablet (25 mg total) by mouth daily.   sildenafil 20 MG tablet Commonly known as:  REVATIO Take 3-5 pills as needed prior to sex.       Follow-up Information    Schnier, Dolores Lory, MD. Schedule an appointment as soon as possible for a visit in 2 week(s).   Specialties:  Vascular Surgery, Cardiology, Radiology, Vascular Surgery Contact information: [place] [address] [phone]           Signed: Evaristo Bury 06/22/2017, 10:09 AM

## 2017-06-26 ENCOUNTER — Other Ambulatory Visit: Payer: Self-pay

## 2017-06-26 NOTE — Patient Outreach (Signed)
[facility]) Care Management  06/26/2017  Ricardo Cervantes Sr. 09/22/1955 [id number]  EMMI: General discharge Referral date: 06/26/17 Referral source: EMMI general discharge red alert Referral reason: Got discharge papers: NO, Know who to call about changes in condition: NO Day # 1  Telephone call to patient regarding EMMI general discharge red alert. HIPAA verified. Discussed EMMI general discharge with patient. Patient states he received discharge papers but has not reviewed them. Patient states he knows to call his surgeon for any problems regarding his recent surgery.  Patient states, "What is the need to go to my primary doctor for. What are they going to do." RNCM advised patient of importance of follow up with primary MD. Patient states he will have a follow up appointment with the surgeon within 2 weeks. RNCM reviewed signs/ symptoms of infection.  Patient denies any  Signs of infection. RNCM advised patient to call his doctor if these symptoms develop. Patient states he has his medications and takes them as prescribed. Patient states he has transportation to his doctor appointments. Patient states he does not have any additional needs or questions at this time. RNCM offered to give patient 24 hour nurse line number. Patient refused. Patient states he has a son that is an Therapist, sports and if he has problems he will ask him   PLAN: RNCM will refer patient to care management assistant close due to patient being assessed and having no further needs.   Quinn Plowman RN,BSN,CCM [facility] Telephonic  [phone]

## 2017-07-04 ENCOUNTER — Encounter (INDEPENDENT_AMBULATORY_CARE_PROVIDER_SITE_OTHER): Payer: Self-pay | Admitting: Vascular Surgery

## 2017-07-04 ENCOUNTER — Ambulatory Visit (INDEPENDENT_AMBULATORY_CARE_PROVIDER_SITE_OTHER): Payer: Medicare Other | Admitting: Vascular Surgery

## 2017-07-04 VITALS — BP 118/78 | HR 98 | Resp 16 | Ht 72.0 in | Wt 181.0 lb

## 2017-07-04 DIAGNOSIS — E785 Hyperlipidemia, unspecified: Secondary | ICD-10-CM

## 2017-07-04 DIAGNOSIS — I70221 Atherosclerosis of native arteries of extremities with rest pain, right leg: Secondary | ICD-10-CM

## 2017-07-04 DIAGNOSIS — Z716 Tobacco abuse counseling: Secondary | ICD-10-CM

## 2017-07-04 DIAGNOSIS — E1169 Type 2 diabetes mellitus with other specified complication: Secondary | ICD-10-CM | POA: Insufficient documentation

## 2017-07-04 MED ORDER — OXYCODONE-ACETAMINOPHEN 5-325 MG PO TABS: ORAL_TABLET | ORAL | 0 refills | Status: DC

## 2017-07-04 NOTE — Progress Notes (Signed)
Subjective:    Patient ID: Ricardo Folks Sr., male    DOB: 25-Dec-1955, 61 y.o.   MRN: [id number] Chief Complaint  Patient presents with  . Follow-up    2wks Staple removal   Patient presents for his first postoperative follow-up. Patient underwent right femoral artery to tibioperoneal artery bypass with in-situ saphenous vein graft, right common femoral endarterectomy, right profunda femoris endarterectomy with core matrix patch angioplasty, right tibioperoneal trunk and peroneal artery endarterectomy with right proximal SFA endarterectomy and removal of previously placed stent on 06/19/17. Patient's postoperative course has been uneventful. Patient with some pain to the right lower extremity however he has been ambulating on his treadmill. Patient denies any rest pain or ulceration to the right lower extremity. He presents today for his first staple removal. He denies any drainage from his incision sites. He denies any fever, nausea or vomiting.   Review of Systems  Constitutional: Negative.   HENT: Negative.   Eyes: Negative.   Respiratory: Negative.   Cardiovascular: Negative.   Gastrointestinal: Negative.   Endocrine: Negative.   Genitourinary: Negative.   Musculoskeletal: Negative.   Skin: Negative.   Allergic/Immunologic: Negative.   Neurological: Negative.   Hematological: Negative.   Psychiatric/Behavioral: Negative.       Objective:   Physical Exam  Constitutional: He is oriented to person, place, and time. He appears well-developed and well-nourished. No distress.  HENT:  Head: Normocephalic and atraumatic.  Eyes: Pupils are equal, round, and reactive to light. Conjunctivae are normal.  Neck: Normal range of motion.  Cardiovascular: Normal rate, normal heart sounds and intact distal pulses.   Pulses:      Radial pulses are 2+ on the right side, and 2+ on the left side.       Dorsalis pedis pulses are 2+ on the left side.       Posterior tibial pulses are 2+ on  the left side.  Hard to palpate right pedal pulses however his foot is warm  Pulmonary/Chest: Effort normal.  Musculoskeletal: Normal range of motion. He exhibits edema (mild right lower extremity edema).  Neurological: He is alert and oriented to person, place, and time.  Skin: He is not diaphoretic.  Incisions are healing well. Staples are intact. There is no drainage. There is no cellulitis.  Psychiatric: He has a normal mood and affect. His behavior is normal. Judgment and thought content normal.  Vitals reviewed.  BP 118/78 (BP Location: Right Arm)   Pulse 98   Resp 16   Ht 6' (1.829 m)   Wt 181 lb (82.1 kg)   BMI 24.55 kg/m   Past Medical History:  Diagnosis Date  . Arthritis    Rheumatoid  . Bipolar disorder ([place])   . Complication of anesthesia    difficulty keeping patient asleep. fentanyl makes patient mean, grumpy and difficult  . Depression   . Fusion of spine   . GERD (gastroesophageal reflux disease)    slightly uncomfortable. takes otc antacids occasionally  . Headache    deteriorating discs causing headaches. uses advil  . Hepatitis C    HEP "C". treated 2 years ago  . History of kidney stones 2016  . Hyperlipidemia   . Pericarditis    20 years ago  . Peripheral vascular disease ([place])   . Smoker    Social History   Social History  . Marital status: Single    Spouse name: N/A  . Number of children: N/A  . Years of education: N/A  Occupational History  . Not on file.   Social History Main Topics  . Smoking status: Current Every Day Smoker    Packs/day: 0.75    Years: 40.00    Types: Cigarettes  . Smokeless tobacco: Never Used  . Alcohol use 1.2 oz/week    2 Shots of liquor per week     Comment: occasional (every 3-4 weeks)   . Drug use: No     Comment: polysubstance approx 2 years ago  . Sexual activity: Not Currently   Other Topics Concern  . Not on file   Social History Narrative  . No narrative on file   Past Surgical History:    Procedure Laterality Date  . ABDOMINAL AORTOGRAM W/LOWER EXTREMITY N/A 01/15/2017   Procedure: Abdominal Aortogram w/Lower Extremity;  Surgeon: Katha Cabal, MD;  Location: [facility];  Service: Cardiovascular;  Laterality: N/A;  . BACK SURGERY  2004   acdf, titanium c5-6  . FEMORAL-POPLITEAL BYPASS GRAFT Right 06/19/2017   Procedure: BYPASS GRAFT FEMORAL-POPLITEAL ARTERY;  Surgeon: Katha Cabal, MD;  Location: [facility];  Service: Vascular;  Laterality: Right;  . LOWER EXTREMITY ANGIOGRAPHY Right 01/15/2017   Procedure: Lower Extremity Angiography;  Surgeon: Katha Cabal, MD;  Location: [facility];  Service: Cardiovascular;  Laterality: Right;  . LOWER EXTREMITY ANGIOGRAPHY Right 02/13/2017   Procedure: Lower Extremity Angiography;  Surgeon: Katha Cabal, MD;  Location: [facility];  Service: Cardiovascular;  Laterality: Right;   Family History  Problem Relation Age of Onset  . Cancer Mother        liver  . Heart disease Father   . Diabetes Father   . Breast cancer Sister   . Brain cancer Sister    No Known Allergies     Assessment & Plan:  Patient presents for his first postoperative follow-up. Patient underwent right femoral artery to tibioperoneal artery bypass with in-situ saphenous vein graft, right common femoral endarterectomy, right profunda femoris endarterectomy with core matrix patch angioplasty, right tibioperoneal trunk and peroneal artery endarterectomy with right proximal SFA endarterectomy and removal of previously placed stent on 06/19/17. Patient's postoperative course has been uneventful. Patient with some pain to the right lower extremity however he has been ambulating on his treadmill. Patient denies any rest pain or ulceration to the right lower extremity. He presents today for his first staple removal. He denies any drainage from his incision sites. He denies any fever, nausea or vomiting.  1. Atherosclerosis of  native artery of right lower extremity with rest pain [facility]) s/p right femoral artery to tibioperoneal artery bypass - New Patient is doing postoperatively He is having unremarkable postoperative course Patient has been ambulating on his treadmill with some discomfort to the right lower extremity however this has improved when compared to preoperatively Every other staple removed today without consultation Patient to follow up in one week to have the remainder of his staples removed and undergo his first ABI  - VAS [place] ABI WITH/WO TBI; Future  2. Tobacco abuse counseling - Stable We had a discussion for approximately 10 minutes regarding the absolute need for smoking cessation due to the deleterious nature of tobacco on the vascular system. We discussed the tobacco use would diminish patency of any intervention, and likely significantly worsen progressio of disease. We discussed multiple agents for quitting including replacement therapy or medications to reduce cravings such as Chantix. The patient voices their understanding of the importance of smoking cessation.  3.  Hyperlipidemia, unspecified hyperlipidemia type - Stable Encouraged good control as its slows the progression of atherosclerotic disease  Current Outpatient Prescriptions on File Prior to Visit  Medication Sig Dispense Refill  . apixaban (ELIQUIS) 5 MG TABS tablet Take 1 tablet (5 mg total) by mouth 2 (two) times daily. 60 tablet 5  . aspirin EC 81 MG tablet Take 81 mg by mouth daily.    [name] atorvastatin (LIPITOR) 20 MG tablet Take 1 tablet (20 mg total) by mouth daily at 6 PM. 30 tablet 3  . ibuprofen (ADVIL) 200 MG tablet Take 800 mg by mouth every 8 (eight) hours as needed for headache or moderate pain.     . cyanocobalamin 500 MCG tablet Take 1 tablet (500 mcg total) by mouth daily. (Patient not taking: Reported on 06/07/2017) 30 tablet 3  . docusate sodium (COLACE) 100 MG capsule Take 1 capsule (100 mg total) by mouth daily.  (Patient not taking: Reported on 07/04/2017) 10 capsule 0  . folic acid (FOLVITE) 1 MG tablet Take 1 tablet (1 mg total) by mouth daily. (Patient not taking: Reported on 06/07/2017) 30 tablet 3  . metoprolol succinate (TOPROL-XL) 25 MG 24 hr tablet Take 25 mg by mouth daily.    [name] pyridOXINE (VITAMIN B-6) 25 MG tablet Take 1 tablet (25 mg total) by mouth daily. (Patient not taking: Reported on 06/07/2017) 30 tablet 3  . sildenafil (REVATIO) 20 MG tablet Take 3-5 pills as needed prior to sex. (Patient not taking: Reported on 06/07/2017) 30 tablet 1   No current facility-administered medications on file prior to visit.     There are no Patient Instructions on file for this visit. No Follow-up on file.   Joseline Mccampbell A Taylen Wendland, PA-C

## 2017-07-15 ENCOUNTER — Ambulatory Visit (INDEPENDENT_AMBULATORY_CARE_PROVIDER_SITE_OTHER): Payer: Medicare Other

## 2017-07-15 ENCOUNTER — Encounter (INDEPENDENT_AMBULATORY_CARE_PROVIDER_SITE_OTHER): Payer: Self-pay | Admitting: Vascular Surgery

## 2017-07-15 ENCOUNTER — Ambulatory Visit (INDEPENDENT_AMBULATORY_CARE_PROVIDER_SITE_OTHER): Payer: Medicare Other | Admitting: Vascular Surgery

## 2017-07-15 VITALS — BP 131/81 | HR 97 | Resp 16 | Ht 74.0 in | Wt 181.8 lb

## 2017-07-15 DIAGNOSIS — I70221 Atherosclerosis of native arteries of extremities with rest pain, right leg: Secondary | ICD-10-CM

## 2017-07-15 DIAGNOSIS — I70211 Atherosclerosis of native arteries of extremities with intermittent claudication, right leg: Secondary | ICD-10-CM

## 2017-07-15 DIAGNOSIS — Z716 Tobacco abuse counseling: Secondary | ICD-10-CM

## 2017-07-15 DIAGNOSIS — E785 Hyperlipidemia, unspecified: Secondary | ICD-10-CM

## 2017-07-15 MED ORDER — OXYCODONE-ACETAMINOPHEN 5-325 MG PO TABS: ORAL_TABLET | ORAL | 0 refills | Status: DC

## 2017-07-15 NOTE — Progress Notes (Signed)
Subjective:    Patient ID: Ricardo Folks Sr., male    DOB: 06-27-1956, 61 y.o.   MRN: [id number] Chief Complaint  Patient presents with  . Follow-up    1wk abi   Patient presents for his second post operative follow up.  The patient presents to have the remaining staples removed and undergo his first ABI.  His postoperative course continues to remain uneventful.  The patient denies any worsening lower extreme pain, fever, nausea or vomiting.  Denies any issues with his incision.  The patient underwent an ABI which was notable for a right ABI of 1.05 / Left ABI 1.12.  Previous ABI on April 01, 2017 right ABI 0.55/left 1.05.   Review of Systems  Constitutional: Negative.   HENT: Negative.   Eyes: Negative.   Respiratory: Negative.   Cardiovascular:       Right lower extremity pain  Gastrointestinal: Negative.   Endocrine: Negative.   Genitourinary: Negative.   Musculoskeletal: Negative.   Skin: Negative.   Allergic/Immunologic: Negative.   Neurological: Negative.   Hematological: Negative.   Psychiatric/Behavioral: Negative.       Objective:   Physical Exam  Constitutional: He appears well-developed and well-nourished. No distress.  HENT:  Head: Normocephalic and atraumatic.  Eyes: Conjunctivae are normal. Pupils are equal, round, and reactive to light.  Neck: Normal range of motion.  Cardiovascular: Normal rate, regular rhythm, normal heart sounds and intact distal pulses.  Pulses:      Radial pulses are 2+ on the right side, and 2+ on the left side.       Dorsalis pedis pulses are 2+ on the right side, and 2+ on the left side.       Posterior tibial pulses are 2+ on the right side, and 2+ on the left side.  Right lower extremity incisions: Remaining staples are intact.  Incisions are healing well.  Pulmonary/Chest: Effort normal and breath sounds normal.  Musculoskeletal: Normal range of motion. He exhibits no edema.  Neurological: He is alert.  Skin: Skin is warm and  dry. He is not diaphoretic.  Psychiatric: He has a normal mood and affect. His behavior is normal. Judgment and thought content normal.  Vitals reviewed.  BP 131/81 (BP Location: Right Arm)   Pulse 97   Resp 16   Ht 6\' 2"  (1.88 m)   Wt 181 lb 12.8 oz (82.5 kg)   BMI 23.34 kg/m   Past Medical History:  Diagnosis Date  . Arthritis    Rheumatoid  . Bipolar disorder ([place])   . Complication of anesthesia    difficulty keeping patient asleep. fentanyl makes patient mean, grumpy and difficult  . Depression   . Fusion of spine   . GERD (gastroesophageal reflux disease)    slightly uncomfortable. takes otc antacids occasionally  . Headache    deteriorating discs causing headaches. uses advil  . Hepatitis C    HEP "C". treated 2 years ago  . History of kidney stones 2016  . Hyperlipidemia   . Pericarditis    20 years ago  . Peripheral vascular disease ([place])   . Smoker    Social History   Socioeconomic History  . Marital status: Single    Spouse name: Not on file  . Number of children: Not on file  . Years of education: Not on file  . Highest education level: Not on file  Social Needs  . Financial resource strain: Not on file  . Food insecurity - worry:  Not on file  . Food insecurity - inability: Not on file  . Transportation needs - medical: Not on file  . Transportation needs - non-medical: Not on file  Occupational History  . Not on file  Tobacco Use  . Smoking status: Current Every Day Smoker    Packs/day: 0.75    Years: 40.00    Pack years: 30.00    Types: Cigarettes  . Smokeless tobacco: Never Used  Substance and Sexual Activity  . Alcohol use: Yes    Alcohol/week: 1.2 oz    Types: 2 Shots of liquor per week    Comment: occasional (every 3-4 weeks)   . Drug use: No    Comment: polysubstance approx 2 years ago  . Sexual activity: Not Currently  Other Topics Concern  . Not on file  Social History Narrative  . Not on file   Past Surgical History:    Procedure Laterality Date  . BACK SURGERY  2004   acdf, titanium c5-6   Family History  Problem Relation Age of Onset  . Cancer Mother        liver  . Heart disease Father   . Diabetes Father   . Breast cancer Sister   . Brain cancer Sister    No Known Allergies     Assessment & Plan:  Patient presents for his second post operative follow up.  The patient presents to have the remaining staples removed and undergo his first ABI.  His postoperative course continues to remain uneventful.  The patient denies any worsening lower extreme pain, fever, nausea or vomiting.  Denies any issues with his incision.  The patient underwent an ABI which was notable for a right ABI of 1.05 / Left ABI 1.12.  Previous ABI on April 01, 2017 right ABI 0.55/left 1.05.  1. Atherosclerosis of native artery of right lower extremity with intermittent claudication ([place]) - Stable The patient's postoperative course continues to be uneventful. Physical exam is unremarkable. Pedal pulses are palpable Remaining staples have been removed without complication Patient to follow-up in 3 months with ABI and right lower extremity arterial duplex  - VAS [place] ABI WITH/WO TBI; Future - VAS [place] LOWER EXTREMITY ARTERIAL DUPLEX; Future  2. Hyperlipidemia, unspecified hyperlipidemia type - stable Encouraged good control as its slows the progression of atherosclerotic disease  3. Tobacco abuse counseling - Stable We had a discussion for approximately 10 minutes regarding the absolute need for smoking cessation due to the deleterious nature of tobacco on the vascular system. We discussed the tobacco use would diminish patency of any intervention, and likely significantly worsen progressio of disease. We discussed multiple agents for quitting including replacement therapy or medications to reduce cravings such as Chantix. The patient voices their understanding of the importance of smoking cessation.  Current Outpatient Medications  on File Prior to Visit  Medication Sig Dispense Refill  . apixaban (ELIQUIS) 5 MG TABS tablet Take 1 tablet (5 mg total) by mouth 2 (two) times daily. 60 tablet 5  . aspirin EC 81 MG tablet Take 81 mg by mouth daily.    [name] atorvastatin (LIPITOR) 20 MG tablet Take 1 tablet (20 mg total) by mouth daily at 6 PM. 30 tablet 3  . ibuprofen (ADVIL) 200 MG tablet Take 800 mg by mouth every 8 (eight) hours as needed for headache or moderate pain.     . metoprolol succinate (TOPROL-XL) 25 MG 24 hr tablet Take 25 mg by mouth daily.    [name] pyridOXINE (  VITAMIN B-6) 25 MG tablet Take 1 tablet (25 mg total) by mouth daily. 30 tablet 3  . sildenafil (REVATIO) 20 MG tablet Take 3-5 pills as needed prior to sex. 30 tablet 1  . cyanocobalamin 500 MCG tablet Take 1 tablet (500 mcg total) by mouth daily. (Patient not taking: Reported on 06/07/2017) 30 tablet 3  . docusate sodium (COLACE) 100 MG capsule Take 1 capsule (100 mg total) by mouth daily. (Patient not taking: Reported on 07/04/2017) 10 capsule 0  . folic acid (FOLVITE) 1 MG tablet Take 1 tablet (1 mg total) by mouth daily. (Patient not taking: Reported on 06/07/2017) 30 tablet 3   No current facility-administered medications on file prior to visit.     There are no Patient Instructions on file for this visit. No Follow-up on file.   Anner Baity A Nasire Reali, PA-C

## 2017-08-22 ENCOUNTER — Other Ambulatory Visit: Payer: Self-pay

## 2017-08-22 ENCOUNTER — Encounter: Payer: Self-pay | Admitting: Nurse Practitioner

## 2017-08-22 ENCOUNTER — Ambulatory Visit (INDEPENDENT_AMBULATORY_CARE_PROVIDER_SITE_OTHER): Payer: Medicare HMO | Admitting: Nurse Practitioner

## 2017-08-22 VITALS — BP 123/84 | HR 84 | Temp 97.7°F | Resp 16 | Ht 74.0 in | Wt 181.6 lb

## 2017-08-22 DIAGNOSIS — F17210 Nicotine dependence, cigarettes, uncomplicated: Secondary | ICD-10-CM | POA: Diagnosis not present

## 2017-08-22 DIAGNOSIS — J410 Simple chronic bronchitis: Secondary | ICD-10-CM

## 2017-08-22 DIAGNOSIS — Z716 Tobacco abuse counseling: Secondary | ICD-10-CM

## 2017-08-22 DIAGNOSIS — I70292 Other atherosclerosis of native arteries of extremities, left leg: Secondary | ICD-10-CM | POA: Diagnosis not present

## 2017-08-22 MED ORDER — UMECLIDINIUM BROMIDE 62.5 MCG/INH IN AEPB: 1.0000 | INHALATION_SPRAY | Freq: Every day | RESPIRATORY_TRACT | 3 refills | Status: DC

## 2017-08-22 MED ORDER — VARENICLINE TARTRATE 0.5 MG X 11 & 1 MG X 42 PO MISC: ORAL | 0 refills | Status: DC

## 2017-08-22 MED ORDER — ONDANSETRON HCL 4 MG PO TABS: 4.0000 mg | ORAL_TABLET | Freq: Two times a day (BID) | ORAL | 2 refills | Status: DC | PRN

## 2017-08-22 NOTE — Patient Instructions (Addendum)
Ricardo Cervantes, Thank you for coming in to clinic today.  1. For smoking cessation, we are going to start chantix.   Start varenicline (Chantix) 7 days BEFORE your quit date.    Your quit date: 09/02/2017      START Chantix on: 08/26/2017  - Day 1-3: Take one 0.5mg  tablet once daily WITH FOOD - Days 4-7: increase to one 0.5mg  tablet twice per day.  - Days 7- 12 weeks max: Increase to one 1mg  tablet twice per day for up to 12 weeks  To quit smoking:  - Only start this treatment if you are mentally ready to quit. - Start Chantix 1 week before your quit date.  If unable to quit completely in 7 days, then work to reduce by 50% or more by 4 weeks, then another 50% reduction in 4 more weeks, and lastly quit after a final 4 weeks.  - We can continue Chantix for up to 12 weeks total if needed for maintenance. - Common side effects include nausea (take with food), headaches, insomnia, vivid dreams, mood instability, seizure, and a rare risk of suicidal ideation.  If you have any serious agitation or acute depression with severe mood changes you need to stop taking the medicine immediately   - For the first 4 weeks, you may use ondansetron(Zofran) 4 mg about 15 minutes before your Chantix doses.  If you have improvement of your nausea, you can try stopping the Zofran.  2. START incruse one inhalation once daily.   Please schedule a follow-up appointment with Cassell Smiles, AGNP. Return in about 4 weeks (around 09/19/2017) for Chantix.   If you have any other questions or concerns, please feel free to call the clinic or send a message through [place]. You may also schedule an earlier appointment if necessary.  You will receive a survey after today's visit either digitally by e-mail or paper by [employer]. Your experiences and feedback matter to [place].  Please respond so we know how we are doing as we provide care for you.  Cassell Smiles, DNP, AGNP-BC Adult Gerontology Nurse Practitioner [place]

## 2017-08-22 NOTE — Progress Notes (Signed)
Subjective:    Patient ID: Ricardo Folks Sr., male    DOB: September 21, 1955, 61 y.o.   MRN: [id number]  Ricardo ARGUIJO Sr. is a 61 y.o. male presenting on 08/22/2017 for Nicotine Dependence   HPI Nicotine Dependence Pt presents today independently asking to discuss smoking cessation.  He has been encouraged to quit and is finally ready to try again.  He has had previous attempts to quit smoking in the past using Wellbutrin (has increased agitation and homicidal thoughts with Wellbutrin), cutting back smoking (results in increasing smoking), hypnosis (stopped for 2 weeks), and Chantix.  Liked the effects of Chantix on smoking cravings, but has had severe nausea with Chantix in past and did not take very long at full dose. - Pt is now smoking 1 ppd up from 3/4 ppd 9 months ago. - Denies SI/HI and has no plans to carry out if SI/HI arise.   Chronic Bronchitis He has a smoker's cough daily that is congested and occasionally productive.  He is not currently using any inhalers.  He reports "getting winded very quickly" with activity.  He was mopping and sweeping his home this week and got very short of breath.  Takes about 5 minutes for symptoms to resolve.  Pt reports he had symptoms prior to treatment of intermittent claudication.  And his claudication was not decreasing his mobility enough to mask these symptoms.  Only arising now because he states, "I was in denial that it was bad."   Symptoms have existed for > 12 months.  Intermittent claudication: Pt has had left fem-pop bypass graft and is now doing well without any active claudication symptoms.  Pain is resolving and activity is slowly returning to prior level.  Social History   Tobacco Use  . Smoking status: Current Every Day Smoker    Packs/day: 1.00    Years: 40.00    Pack years: 40.00    Types: Cigarettes  . Smokeless tobacco: Never Used  Substance Use Topics  . Alcohol use: Yes    Alcohol/week: 1.2 oz    Types: 2 Shots of  liquor per week    Comment: occasional (every 3-4 weeks)   . Drug use: No    Comment: polysubstance approx 2 years ago    Review of Systems Per HPI unless specifically indicated above     Objective:    BP 123/84 (BP Location: Right Arm, Patient Position: Sitting, Cuff Size: Normal)   Pulse 84   Temp 97.7 F (36.5 C) (Oral)   Resp 16   Ht 6\' 2"  (1.88 m)   Wt 181 lb 9.6 oz (82.4 kg)   SpO2 98%   BMI 23.32 kg/m   Wt Readings from Last 3 Encounters:  08/22/17 181 lb 9.6 oz (82.4 kg)  07/15/17 181 lb 12.8 oz (82.5 kg)  07/04/17 181 lb (82.1 kg)    Physical Exam  Constitutional: He is oriented to person, place, and time. He appears well-developed and well-nourished. No distress.  HENT:  Head: Normocephalic and atraumatic.  Eyes: Conjunctivae and EOM are normal. Pupils are equal, round, and reactive to light.  Neck: Normal range of motion. Neck supple. No thyromegaly present.  Cardiovascular: Normal rate, regular rhythm, normal heart sounds and intact distal pulses.  Pulmonary/Chest: Effort normal. No respiratory distress. He has no wheezes. He has rales.  Neurological: He is alert and oriented to person, place, and time.  Skin: Skin is warm and dry.  Psychiatric: He has a normal mood and  affect. His behavior is normal. Judgment and thought content normal.  Denies SI/HI and has no plans to carry out if SI/HI arise.  Nursing note and vitals reviewed.     Assessment & Plan:   Problem List Items Addressed This Visit    None    Visit Diagnoses    Encounter for smoking cessation counseling    -  Primary Pt requests to try Chantix today.  Pt has previously declined any assistance with smoking cessation, but states he is now mentally ready to quit.   - Pt requests medication to assist with nausea since this is what caused failure if chantix in past.    1. 7 days prior to quit date, START Chantix starting pack.  Take 0.5 mg tablet once daily w/ food for 3 days.  Take 0.5 mg  tablet twice daily w/ food for 3 days.  Then, take 1 mg tablet twice daily and continue for total treatment w/ Chantix of 4-12 weeks.  Stop smoking or reduce by half on quit date. - Established a mutually set quit date and start date of chantix. - Reviewed side effects of nausea, vivid dreams, depression, possible SI. - START ondansetron 4 mg tablet twice daily approximately 20 minutes prior to taking Chantix.  May have resolution of nausea as pt is on medication.  Encouraged to use ondansetron only if needed after 2  Weeks. 2. Followup 4 weeks to assess continuation.  Call sooner if experiencing significant side effects.  Discussion today >10 minutes specifically on counseling on risks of tobacco use, complications, treatment, smoking cessation and details on non-pharm approaches to use with pharm for successful smoking cessation.   Relevant Medications   varenicline (CHANTIX STARTING MONTH PAK) 0.5 MG X 11 & 1 MG X 42 tablet   ondansetron (ZOFRAN) 4 MG tablet   Simple chronic bronchitis (HCC)     Pt has had worsening of shortness of breath with exertion and chronic cough.  Now is requesting assistance with managing symptoms.  Duration present for approximately 1 year.  Plan: 1. START Incruse one inhalation once daily for maintenance therapy. - Rinse, swish, spit water after use of inhaler. 2. Followup 4 weeks.   Relevant Medications   umeclidinium bromide (INCRUSE ELLIPTA) 62.5 MCG/INH AEPB      Meds ordered this encounter  Medications  . umeclidinium bromide (INCRUSE ELLIPTA) 62.5 MCG/INH AEPB    Sig: Inhale 1 puff into the lungs daily.    Dispense:  30 each    Refill:  3    Order Specific Question:   Supervising Provider    Answer:   Olin Hauser [[id number]]  . varenicline (CHANTIX STARTING MONTH PAK) 0.5 MG X 11 & 1 MG X 42 tablet    Sig: Take one 0.5 mg tab by mouth once daily for 3 days, then take one 0.5 mg tab twice daily for 4 days, then take one 1 mg tab twice daily.      Dispense:  53 tablet    Refill:  0    Order Specific Question:   Supervising Provider    Answer:   Olin Hauser [[id number]]  . ondansetron (ZOFRAN) 4 MG tablet    Sig: Take 1 tablet (4 mg total) by mouth 2 (two) times daily as needed for nausea or vomiting.    Dispense:  60 tablet    Refill:  2    Order Specific Question:   Supervising Provider    Answer:   Nobie Putnam  J [[id number]]    Follow up plan: Return in about 4 weeks (around 09/19/2017) for Chantix.  Cassell Smiles, DNP, AGPCNP-BC Adult Gerontology Primary Care Nurse Practitioner [facility] 08/22/2017, 1:43 PM

## 2017-09-01 ENCOUNTER — Encounter: Payer: Self-pay | Admitting: Nurse Practitioner

## 2017-09-01 DIAGNOSIS — J410 Simple chronic bronchitis: Secondary | ICD-10-CM | POA: Insufficient documentation

## 2017-09-19 ENCOUNTER — Encounter: Payer: Self-pay | Admitting: Nurse Practitioner

## 2017-09-19 ENCOUNTER — Ambulatory Visit (INDEPENDENT_AMBULATORY_CARE_PROVIDER_SITE_OTHER): Payer: Medicare HMO | Admitting: Nurse Practitioner

## 2017-09-19 ENCOUNTER — Other Ambulatory Visit: Payer: Self-pay

## 2017-09-19 DIAGNOSIS — F17200 Nicotine dependence, unspecified, uncomplicated: Secondary | ICD-10-CM | POA: Diagnosis not present

## 2017-09-19 DIAGNOSIS — J410 Simple chronic bronchitis: Secondary | ICD-10-CM | POA: Diagnosis not present

## 2017-09-19 MED ORDER — UMECLIDINIUM BROMIDE 62.5 MCG/INH IN AEPB
1.0000 | INHALATION_SPRAY | Freq: Every day | RESPIRATORY_TRACT | 5 refills | Status: DC
Start: 2017-09-19 — End: 2020-11-10

## 2017-09-19 MED ORDER — BUDESONIDE-FORMOTEROL FUMARATE 80-4.5 MCG/ACT IN AERO: 2.0000 | INHALATION_SPRAY | Freq: Two times a day (BID) | RESPIRATORY_TRACT | 5 refills | Status: DC

## 2017-09-19 NOTE — Patient Instructions (Addendum)
Ricardo Cervantes, Thank you for coming in to clinic today.  1. Continue working on cutting back very slowly.  Possibly try reducing 1 cigarette every 3-4 weeks until you are smoking no cigarettes. - Use your timer and any other techniques you have used in the past. - Consider trying hypnosis again.  We can use this with counseling as well.  2.  Continue Incruse 1 puff once daily 3. START Symbicort 2 puffs twice daily  4. You will be called about getting a pulmonary function test to determine how bad your lung disease is. - Before your lung test, stop taking all inhalers for 7 days.  Please schedule a follow-up appointment with Cassell Smiles, AGNP. Return in about 3 months (around 12/18/2017) for COPD.  If you have any other questions or concerns, please feel free to call the clinic or send a message through [employer]. You may also schedule an earlier appointment if necessary.  You will receive a survey after today's visit either digitally by e-mail or paper by [employer]. Your experiences and feedback matter to [place].  Please respond so we know how we are doing as we provide care for you.  Cassell Smiles, DNP, AGNP-BC Adult Gerontology Nurse Practitioner [place]   Pulmonary Function Tests Pulmonary function tests (PFTs) are used to measure how well your lungs work, find out what is causing your lung problems, and figure out the best treatment for you. You may have PFTs:  When you have an illness involving the lungs.  To follow changes in your lung function over time if you have a chronic lung disease.  If you are an Nature conservation officer. This checks the effects of being exposed to chemicals over a long period of time.  To check lung function before having surgery or other procedures.  To check your lungs if you smoke.  To check if prescribed medicines or treatments are helping your lungs.  Your results will be compared to the expected lung function of someone with  healthy lungs who is similar to you in:  Age.  Gender.  Height.  Weight.  Race or ethnicity.  This is done to show how your lungs compare to normal lung function (percent predicted). This is how your health care provider knows if your lung function is normal or not. If you have had PFTs done before, your health care provider will compare your current results with past results. This shows if your lung function is better, worse, or the same as before. Tell a health care provider about:  Any allergies you have.  All medicines you are taking, including inhaler or nebulizer medicines, vitamins, herbs, eye drops, creams, and over-the-counter medicines.  Any blood disorders you have.  Any surgeries you have had, especially recent eye surgery, abdominal surgery, or chest surgery. These can make PFTs difficult or unsafe.  Any medical conditions you have, including chest pain or heart problems, tuberculosis, or respiratory infections such as pneumonia, a cold, or the flu.  Any fear of being in closed spaces (claustrophobia). Some of your tests may be in a closed space. What are the risks? Generally, this is a safe procedure. However, problems may occur, including:  Light-headedness due to over-breathing (hyperventilation).  An asthma attack from deep breathing.  A collapsed lung.  What happens before the procedure?  Take over-the-counter and prescription medicines only as told by your health care provider. If you take inhaler or nebulizer medicines, ask your health care provider which medicines you  should take on the day of your testing. Some inhaler medicines may interfere with PFTs if they are taken shortly before the tests.  Follow your health care provider's instructions on eating and drinking restrictions. This may include avoiding eating large meals and drinking alcohol before the testing.  Do not use any products that contain nicotine or tobacco, such as cigarettes and  e-cigarettes. If you need help quitting, ask your health care provider.  Wear comfortable clothing that will not interfere with breathing. What happens during the procedure?  You will be given a soft nose clip to wear. This is done so all of your breaths will go through your mouth instead of your nose.  You will be given a germ-free (sterile) mouthpiece. It will be attached to a machine that measures your breathing (spirometer).  You will be asked to do various breathing maneuvers. The maneuvers will be done by breathing in (inhaling) and breathing out (exhaling). You may be asked to repeat the maneuvers several times before the testing is done.  It is important to follow the instructions exactly to get accurate results. Make sure to blow as hard and as fast as you can when you are told to do so.  You may be given a medicine that makes the small air passages in your lungs larger (bronchodilator) after testing has been done. This medicine will make it easier for you to breathe.  The tests will be repeated after the bronchodilator has taken effect.  You will be monitored carefully during the procedure for faintness, dizziness, trouble breathing, or any other problems. The procedure may vary among health care providers and hospitals. What happens after the procedure?  It is up to you to get your test results. Ask your health care provider, or the department that is doing the tests, when your results will be ready. After you have received your test results, talk with your health care provider about treatment options, if necessary. Summary  Pulmonary function tests (PFTs) are used to measure how well your lungs work, find out what is causing your lung problems, and figure out the best treatment for you.  Wear comfortable clothing that will not interfere with breathing.  It is up to you to get your test results. After you have received them, talk with your health care provider about treatment  options, if necessary. This information is not intended to replace advice given to you by your health care provider. Make sure you discuss any questions you have with your health care provider. Document Released: 04/12/2004 Document Revised: 07/12/2016 Document Reviewed: 07/12/2016 Elsevier Interactive Patient Education  [employer].

## 2017-09-19 NOTE — Progress Notes (Signed)
Subjective:    Patient ID: Ricardo Folks Sr., male    DOB: 12/31/1955, 62 y.o.   MRN: [id number]  Ricardo HAYNER Sr. is a 62 y.o. male presenting on 09/19/2017 for Nicotine Dependence (pt d/c taking the chantix, because he didn't notice any improvement )   HPI COPD Incruse helps with cough.  Coughs less, but also makes it truly productive instead of resolving the cough.  He still notes regular shortness of breath, usually with more exertion.  He has not had any episodes of shortness of breath that have not resolved with rest.  He does not have an albuterol inhaler for rescue.  Smoking Cessation Pt notes he experienced significant depression about 2 weeks after starting Chantix.  He notes he stopped the medication as soon as these symptoms occurred, more out of anhedonia than recognition that Chantix was causing his symptoms.  No SI/HI.  Depression is slowly improving since stopping this med.  More severe depression only lasted 3-4 days.  He has since been off Chantix for about 2 weeks.   - Pt smokes about 20 cigs daily currently and is not willing to try nicotine replacement.  Social History   Tobacco Use  . Smoking status: Current Every Day Smoker    Packs/day: 1.00    Years: 40.00    Pack years: 40.00    Types: Cigarettes  . Smokeless tobacco: Never Used  Substance Use Topics  . Alcohol use: Yes    Alcohol/week: 1.2 oz    Types: 2 Shots of liquor per week    Comment: occasional (every 3-4 weeks)   . Drug use: No    Comment: polysubstance approx 2 years ago    Review of Systems Per HPI unless specifically indicated above     Objective:    BP 124/80 (BP Location: Right Arm, Patient Position: Sitting, Cuff Size: Normal)   Pulse 79   Temp 98 F (36.7 C) (Oral)   Resp 16   Ht 6\' 2"  (1.88 m)   Wt 182 lb (82.6 kg)   SpO2 98%   BMI 23.37 kg/m   Wt Readings from Last 3 Encounters:  09/19/17 182 lb (82.6 kg)  08/22/17 181 lb 9.6 oz (82.4 kg)  07/15/17 181 lb 12.8 oz  (82.5 kg)    Physical Exam  General - healthy, well-appearing, NAD HEENT - Normocephalic, atraumatic Neck - supple, non-tender, no LAD,no carotid bruit Heart - RRR, no murmurs heard Lungs - Diminished air movement throughout all lobes, mild wheezing, and rhonchi throughout.  No crackles. Normal work of breathing. Extremeties - non-tender, no edema, cap refill < 2 seconds, peripheral pulses intact +2 radial bilaterally, +1 pedal bilaterally Skin - warm, dry Neuro - awake, alert, oriented x3, normal gait Psych - Normal mood and affect, normal behavior      Assessment & Plan:   Problem List Items Addressed This Visit      Respiratory   Simple chronic bronchitis (HCC)    Pt with smoker's cough and chronic bronchitis.  Symptoms partially improved with use of daily Incruse.  Pt with continued suboptimal control despite initiation of Incruse at last visit.   Plan: 1. START symbicort 80-4.5 mcg/act inhaler.  Use 2 puffs twice daily. 2. Continue incruse once daily 3. Obtain PFTs 4. Pt refused albuterol, but should consider at next visit for rescue symptoms. 5. Followup 3 months      Relevant Medications   umeclidinium bromide (INCRUSE ELLIPTA) 62.5 MCG/INH AEPB   budesonide-formoterol (  SYMBICORT) 80-4.5 MCG/ACT inhaler   Other Relevant Orders   Pulmonary function test     Other   Smoker    Pt with treatment failure after initiation of Chantix and depression.  Pt continues to smoke 1 ppd.  He has previously been as low as 3/4 ppd.    Plan: 1. Encouraged pt to work to decrease cigarette use by about 1 cigarette per day each week.  If unable to continue to reduce, should lengthen interval to 1 cigarette less per day each month. 2. Consider hypnosis again in future as this was successful for short term in past. 3. Stress was trigger for resuming smoking, so work now on stress management techniques to support smoking cessation.  Discussion today >5 minutes (<10 minutes) specifically on  counseling on risks of tobacco use, complications, treatment, smoking cessation as above.         Meds ordered this encounter  Medications  . umeclidinium bromide (INCRUSE ELLIPTA) 62.5 MCG/INH AEPB    Sig: Inhale 1 puff into the lungs daily.    Dispense:  30 each    Refill:  5    Order Specific Question:   Supervising Provider    Answer:   Olin Hauser [[id number]]  . budesonide-formoterol (SYMBICORT) 80-4.5 MCG/ACT inhaler    Sig: Inhale 2 puffs into the lungs 2 (two) times daily.    Dispense:  1 Inhaler    Refill:  5    Order Specific Question:   Supervising Provider    Answer:   Olin Hauser [[id number]]      Follow up plan: Return in about 3 months (around 12/18/2017) for COPD.   Cassell Smiles, DNP, AGPCNP-BC Adult Gerontology Primary Care Nurse Practitioner [facility] 10/07/2017, 7:06 AM

## 2017-10-07 ENCOUNTER — Encounter: Payer: Self-pay | Admitting: Nurse Practitioner

## 2017-10-07 DIAGNOSIS — F17200 Nicotine dependence, unspecified, uncomplicated: Secondary | ICD-10-CM | POA: Insufficient documentation

## 2017-10-07 NOTE — Assessment & Plan Note (Addendum)
Pt with smoker's cough and chronic bronchitis.  Symptoms partially improved with use of daily Incruse.  Pt with continued suboptimal control despite initiation of Incruse at last visit.   Plan: 1. START symbicort 80-4.5 mcg/act inhaler.  Use 2 puffs twice daily. 2. Continue incruse once daily 3. Obtain PFTs 4. Pt refused albuterol, but should consider at next visit for rescue symptoms. 5. Followup 3 months

## 2017-10-07 NOTE — Assessment & Plan Note (Signed)
Pt with treatment failure after initiation of Chantix and depression.  Pt continues to smoke 1 ppd.  He has previously been as low as 3/4 ppd.    Plan: 1. Encouraged pt to work to decrease cigarette use by about 1 cigarette per day each week.  If unable to continue to reduce, should lengthen interval to 1 cigarette less per day each month. 2. Consider hypnosis again in future as this was successful for short term in past. 3. Stress was trigger for resuming smoking, so work now on stress management techniques to support smoking cessation.  Discussion today >5 minutes (<10 minutes) specifically on counseling on risks of tobacco use, complications, treatment, smoking cessation as above.

## 2017-10-16 ENCOUNTER — Other Ambulatory Visit: Payer: Self-pay | Admitting: Nurse Practitioner

## 2017-10-16 DIAGNOSIS — Z716 Tobacco abuse counseling: Secondary | ICD-10-CM

## 2017-10-17 ENCOUNTER — Ambulatory Visit (INDEPENDENT_AMBULATORY_CARE_PROVIDER_SITE_OTHER): Payer: Medicare HMO | Admitting: Vascular Surgery

## 2017-10-17 ENCOUNTER — Ambulatory Visit (INDEPENDENT_AMBULATORY_CARE_PROVIDER_SITE_OTHER): Payer: Medicare HMO

## 2017-10-17 ENCOUNTER — Other Ambulatory Visit: Payer: Self-pay | Admitting: Nurse Practitioner

## 2017-10-17 ENCOUNTER — Encounter (INDEPENDENT_AMBULATORY_CARE_PROVIDER_SITE_OTHER): Payer: Self-pay | Admitting: Vascular Surgery

## 2017-10-17 VITALS — BP 130/78 | HR 96 | Resp 17 | Wt 183.6 lb

## 2017-10-17 DIAGNOSIS — I70211 Atherosclerosis of native arteries of extremities with intermittent claudication, right leg: Secondary | ICD-10-CM | POA: Diagnosis not present

## 2017-10-17 DIAGNOSIS — Z720 Tobacco use: Secondary | ICD-10-CM

## 2017-10-17 DIAGNOSIS — E785 Hyperlipidemia, unspecified: Secondary | ICD-10-CM | POA: Diagnosis not present

## 2017-10-17 DIAGNOSIS — D6859 Other primary thrombophilia: Secondary | ICD-10-CM

## 2017-10-17 DIAGNOSIS — J410 Simple chronic bronchitis: Secondary | ICD-10-CM | POA: Diagnosis not present

## 2017-10-17 MED ORDER — VARENICLINE TARTRATE 1 MG PO TABS: 1.0000 mg | ORAL_TABLET | Freq: Two times a day (BID) | ORAL | 2 refills | Status: DC

## 2017-10-19 ENCOUNTER — Encounter (INDEPENDENT_AMBULATORY_CARE_PROVIDER_SITE_OTHER): Payer: Self-pay | Admitting: Vascular Surgery

## 2017-10-19 NOTE — Progress Notes (Signed)
MRN : [id number]  Ricardo BILLUPS Sr. is a 62 y.o. (05-30-1956) male who presents with chief complaint of  Chief Complaint  Patient presents with  . Follow-up    3 month ABI and RLE Arterial  .  History of Present Illness: The patient returns to the office for followup and review of the noninvasive studies.   The patient is status post right femoral to below-knee popliteal bypass grafting on June 19, 2017.  In situ great saphenous vein was utilized.    There have been no interval changes in lower extremity symptoms. No interval shortening of the patient's claudication distance or development of rest pain symptoms. No new ulcers or wounds have occurred since the last visit.  There have been no significant changes to the patient's overall health care.  The patient denies amaurosis fugax or recent TIA symptoms. There are no recent neurological changes noted. The patient denies history of DVT, PE or superficial thrombophlebitis. The patient denies recent episodes of angina or shortness of breath.   ABI Rt= 1.02 and Lt=1.08   Duplex ultrasound of the Doppler signals bypass graft demonstrates normal velocities with triphasic Doppler signals throughout   Current Meds  Medication Sig  . apixaban (ELIQUIS) 5 MG TABS tablet Take 1 tablet (5 mg total) by mouth 2 (two) times daily.  [name] aspirin EC 81 MG tablet Take 81 mg by mouth daily.  [name] atorvastatin (LIPITOR) 20 MG tablet Take 1 tablet (20 mg total) by mouth daily at 6 PM.  . budesonide-formoterol (SYMBICORT) 80-4.5 MCG/ACT inhaler Inhale 2 puffs into the lungs 2 (two) times daily.  [name] ibuprofen (ADVIL) 200 MG tablet Take 800 mg by mouth every 8 (eight) hours as needed for headache or moderate pain.   . metoprolol succinate (TOPROL-XL) 25 MG 24 hr tablet Take 25 mg by mouth daily.  [name] umeclidinium bromide (INCRUSE ELLIPTA) 62.5 MCG/INH AEPB Inhale 1 puff into the lungs daily.    Past Medical History:  Diagnosis Date  . Arthritis    Rheumatoid  . Bipolar disorder ([place])   . Complication of anesthesia    difficulty keeping patient asleep. fentanyl makes patient mean, grumpy and difficult  . Depression   . Fusion of spine   . GERD (gastroesophageal reflux disease)    slightly uncomfortable. takes otc antacids occasionally  . Headache    deteriorating discs causing headaches. uses advil  . Hepatitis C    HEP "C". treated 2 years ago  . History of kidney stones 2016  . Hyperlipidemia   . Pericarditis    20 years ago  . Peripheral vascular disease ([place])   . Smoker     Past Surgical History:  Procedure Laterality Date  . ABDOMINAL AORTOGRAM W/LOWER EXTREMITY N/A 01/15/2017   Procedure: Abdominal Aortogram w/Lower Extremity;  Surgeon: Katha Cabal, MD;  Location: [facility];  Service: Cardiovascular;  Laterality: N/A;  . BACK SURGERY  2004   acdf, titanium c5-6  . FEMORAL-POPLITEAL BYPASS GRAFT Right 06/19/2017   Procedure: BYPASS GRAFT FEMORAL-POPLITEAL ARTERY;  Surgeon: Katha Cabal, MD;  Location: [facility];  Service: Vascular;  Laterality: Right;  . LOWER EXTREMITY ANGIOGRAPHY Right 01/15/2017   Procedure: Lower Extremity Angiography;  Surgeon: Katha Cabal, MD;  Location: [facility];  Service: Cardiovascular;  Laterality: Right;  . LOWER EXTREMITY ANGIOGRAPHY Right 02/13/2017   Procedure: Lower Extremity Angiography;  Surgeon: Katha Cabal, MD;  Location: [facility];  Service: Cardiovascular;  Laterality: Right;    Social History Social History   Tobacco Use  . Smoking status: Current Every Day Smoker    Packs/day: 1.00    Years: 40.00    Pack years: 40.00    Types: Cigarettes  . Smokeless tobacco: Never Used  Substance Use Topics  . Alcohol use: Yes    Alcohol/week: 1.2 oz    Types: 2 Shots of liquor per week    Comment: occasional (every 3-4 weeks)   . Drug use: No    Comment: polysubstance approx 2 years ago    Family History Family History    Problem Relation Age of Onset  . Cancer Mother        liver  . Heart disease Father   . Diabetes Father   . Breast cancer Sister   . Brain cancer Sister     Allergies  Allergen Reactions  . Chantix [Varenicline] Other (See Comments)    Depression     REVIEW OF SYSTEMS (Negative unless checked)  Constitutional: [] Weight loss  [] Fever  [] Chills Cardiac: [] Chest pain   [] Chest pressure   [] Palpitations   [] Shortness of breath when laying flat   [] Shortness of breath with exertion. Vascular:  [x] Pain in legs with walking   [] Pain in legs at rest  [] History of DVT   [] Phlebitis   [] Swelling in legs   [] Varicose veins   [] Non-healing ulcers Pulmonary:   [] Uses home oxygen   [] Productive cough   [] Hemoptysis   [] Wheeze  [] COPD   [] Asthma Neurologic:  [] Dizziness   [] Seizures   [] History of stroke   [] History of TIA  [] Aphasia   [] Vissual changes   [] Weakness or numbness in arm   [] Weakness or numbness in leg Musculoskeletal:   [] Joint swelling   [] Joint pain   [] Low back pain Hematologic:  [] Easy bruising  [] Easy bleeding   [] Hypercoagulable state   [] Anemic Gastrointestinal:  [] Diarrhea   [] Vomiting  [] Gastroesophageal reflux/heartburn   [] Difficulty swallowing. Genitourinary:  [] Chronic kidney disease   [] Difficult urination  [] Frequent urination   [] Blood in urine Skin:  [] Rashes   [] Ulcers  Psychological:  [] History of anxiety   []  History of major depression.  Physical Examination  Vitals:   10/17/17 1451  BP: 130/78  Pulse: 96  Resp: 17  Weight: 183 lb 9.6 oz (83.3 kg)   Body mass index is 23.57 kg/m. Gen: WD/WN, NAD Head: [place]/AT, No temporalis wasting.  Ear/Nose/Throat: Hearing grossly intact, nares w/o erythema or drainage Eyes: PER, EOMI, sclera nonicteric.  Neck: Supple, no large masses.   Pulmonary:  Good air movement, no audible wheezing bilaterally, no use of accessory muscles.  Cardiac: RRR, no JVD Vascular: Right leg incisions all well-healed palpable pulse in  the graft Vessel Right Left  Radial Palpable Palpable  PT Palpable Palpable  DP Palpable Palpable  Gastrointestinal: Non-distended. No guarding/no peritoneal signs.  Musculoskeletal: M/S 5/5 throughout.  No deformity or atrophy.  Neurologic: CN 2-12 intact. Symmetrical.  Speech is fluent. Motor exam as listed above. Psychiatric: Judgment intact, Mood & affect appropriate for pt's clinical situation. Dermatologic: No rashes or ulcers noted.  No changes consistent with cellulitis. Lymph : No lichenification or skin changes of chronic lymphedema.  CBC Lab Results  Component Value Date   WBC 9.8 06/20/2017   HGB 13.5 06/20/2017   HCT 40.3 06/20/2017   MCV 94.3 06/20/2017   PLT 148 (L) 06/20/2017    BMET    Component Value Date/Time   NA 132 (L)  06/20/2017 0006   NA 139 12/23/2015 1606   K 4.2 06/20/2017 0006   CL 103 06/20/2017 0006   CO2 22 06/20/2017 0006   GLUCOSE 186 (H) 06/20/2017 0006   BUN 18 06/20/2017 0006   BUN 15 12/23/2015 1606   CREATININE 0.85 06/20/2017 0006   CALCIUM 8.0 (L) 06/20/2017 0006   GFRNONAA >60 06/20/2017 0006   GFRAA >60 06/20/2017 0006   CrCl cannot be calculated (Patient's most recent lab result is older than the maximum 21 days allowed.).  COAG Lab Results  Component Value Date   INR 1.10 06/13/2017    Radiology No results found.  Assessment/Plan 1. Atherosclerosis of native artery of right lower extremity with intermittent claudication (HCC)  Recommend:  The patient has evidence of atherosclerosis of the lower extremities with claudication.  The patient does not voice lifestyle limiting changes at this point in time.  Noninvasive studies do not suggest clinically significant change.  No invasive studies, angiography or surgery at this time The patient should continue walking and begin a more formal exercise program.  The patient should continue antiplatelet therapy and aggressive treatment of the lipid abnormalities  No changes  in the patient's medications at this time  The patient should continue wearing graduated compression socks 10-15 mmHg strength to control the mild edema.    2. Primary hypercoagulable state ([place]) Continue Eliquis  3. Hyperlipidemia, unspecified hyperlipidemia type Continue statin as ordered and reviewed, no changes at this time   4. Simple chronic bronchitis (HCC) Continue pulmonary medications and aerosols as already ordered, these medications have been reviewed and there are no changes at this time.      [name], MD  10/19/2017 1:18 PM

## 2017-12-18 ENCOUNTER — Encounter: Payer: Self-pay | Admitting: Nurse Practitioner

## 2017-12-18 ENCOUNTER — Ambulatory Visit
Admission: RE | Admit: 2017-12-18 | Discharge: 2017-12-18 | Disposition: A | Discharge status: Home / Self Care | Payer: Medicare HMO | Source: Ambulatory Visit | Attending: Nurse Practitioner | Admitting: Nurse Practitioner

## 2017-12-18 ENCOUNTER — Other Ambulatory Visit: Payer: Self-pay

## 2017-12-18 ENCOUNTER — Ambulatory Visit (INDEPENDENT_AMBULATORY_CARE_PROVIDER_SITE_OTHER): Payer: Medicare HMO | Admitting: Nurse Practitioner

## 2017-12-18 VITALS — BP 113/73 | HR 81 | Temp 98.2°F | Resp 20 | Ht 74.0 in | Wt 169.4 lb

## 2017-12-18 DIAGNOSIS — R05 Cough: Secondary | ICD-10-CM

## 2017-12-18 DIAGNOSIS — R059 Cough, unspecified: Secondary | ICD-10-CM

## 2017-12-18 DIAGNOSIS — Z9189 Other specified personal risk factors, not elsewhere classified: Secondary | ICD-10-CM | POA: Diagnosis not present

## 2017-12-18 DIAGNOSIS — J441 Chronic obstructive pulmonary disease with (acute) exacerbation: Secondary | ICD-10-CM

## 2017-12-18 DIAGNOSIS — R5381 Other malaise: Secondary | ICD-10-CM | POA: Diagnosis not present

## 2017-12-18 DIAGNOSIS — R5383 Other fatigue: Secondary | ICD-10-CM | POA: Diagnosis not present

## 2017-12-18 MED ORDER — PREDNISONE 50 MG PO TABS: 50.0000 mg | ORAL_TABLET | Freq: Every day | ORAL | 0 refills | Status: AC

## 2017-12-18 MED ORDER — IPRATROPIUM-ALBUTEROL 0.5-2.5 (3) MG/3ML IN SOLN
3.0000 mL | Freq: Once | RESPIRATORY_TRACT | Status: AC
  Administered 2017-12-18: 3 mL via RESPIRATORY_TRACT

## 2017-12-18 NOTE — Patient Instructions (Addendum)
Ricardo Folks Sr.,   Thank you for coming in to clinic today.  1. You likely are having a COPD exacerbation.   - Continue current inhalers - Start Prednisone 50mg  tablet for 5 days. - We will let you know about your chest xray results. If pneumonia is present on Xray we will start antibiotics. - Start OTC Mucinex (or may try Mucinex-DM for cough) up to 7-10 days then stop - Drink plenty of fluids to improve congestion - You may try over the counter Nasal Saline spray (Simply Saline, Ocean Spray) as needed to reduce congestion. - Drink warm herbal tea with honey for sore throat. - Start taking Tylenol extra strength 1 to 2 tablets every 6-8 hours for aches or fever/chills for next few days as needed.  Do not take more than 3,000 mg in 24 hours from all medicines.  May take Ibuprofen as well if tolerated 200-400mg  every 8 hours as needed.  2. Continue working to reduce smoking.  If symptoms significantly worsening with persistent fevers/chills despite tylenol/ibpurofen, nausea, vomiting unable to tolerate food/fluids or medicine, body aches, or shortness of breath, sinus pain pressure or worsening productive cough, then follow-up for re-evaluation, may seek more immediate care at Urgent Care or ED if more concerned for emergency.  Please schedule a follow-up appointment with Cassell Smiles, AGNP. Return 5-7 days if symptoms worsen or fail to improve.  If you continue having worsening of breathing regularly or no improvement back on medications for next 2 weeks, we will send you to pulmonology specialist.  If you have any other questions or concerns, please feel free to call the clinic or send a message through [place]. You may also schedule an earlier appointment if necessary.  You will receive a survey after today's visit either digitally by e-mail or paper by [employer]. Your experiences and feedback matter to [place].  Please respond so we know how we are doing as we provide care for  you.  Cassell Smiles, DNP, AGNP-BC Adult Gerontology Nurse Practitioner [place]

## 2017-12-18 NOTE — Progress Notes (Signed)
Subjective:    Patient ID: Ricardo Folks Sr., male    DOB: 27-Mar-1956, 62 y.o.   MRN: [id number]  Ricardo VICTORIAN Sr. is a 62 y.o. male presenting on 12/18/2017 for Shortness of Breath (coughing, productive cough x 3 weeks. Symptoms have worsen in the last week. )   HPI Shortness of Breath and coughing Had run out of some inhalers for about 10 days, but has resumed these for last 7 days.  Over last week, he has had increased shortness of breath and wheezing.  He denies any fever, chills and sweats.  Denies all nausea and vomiting, constipation and diarrhea.   - Albuterol inhaler is not helping to improve symptoms. - Pt has severe COPD and is on multiple maintenance inhalers.  Symptoms worsened after having no maintenance inhalers while abroad.  - Returned 7 days ago from trip with extended foreign travel. Travel to [place] x 3 weeks and travel to [place] x 3-4 weeks prior to [place]. - Night sweats, no coughing up blood. - No known Tb exposure  Social History   Tobacco Use  . Smoking status: Current Every Day Smoker    Packs/day: 0.50    Years: 40.00    Pack years: 20.00    Types: Cigarettes  . Smokeless tobacco: Never Used  Substance Use Topics  . Alcohol use: Yes    Alcohol/week: 1.2 oz    Types: 2 Shots of liquor per week    Comment: occasional (every 3-4 weeks)   . Drug use: No    Comment: polysubstance approx 2 years ago    Review of Systems Per HPI unless specifically indicated above     Objective:    BP 113/73 (BP Location: Right Arm, Patient Position: Sitting, Cuff Size: Normal)   Pulse 81   Temp 98.2 F (36.8 C) (Oral)   Resp 20   Ht 6\' 2"  (1.88 m)   Wt 169 lb 6.4 oz (76.8 kg)   SpO2 95%   BMI 21.75 kg/m   Wt Readings from Last 3 Encounters:  12/18/17 169 lb 6.4 oz (76.8 kg)  10/17/17 183 lb 9.6 oz (83.3 kg)  09/19/17 182 lb (82.6 kg)    Physical Exam  Constitutional: He is oriented to person, place, and time. He appears well-developed and  well-nourished. No distress.  HENT:  Head: Normocephalic and atraumatic.  Right Ear: Hearing, tympanic membrane, external ear and ear canal normal.  Left Ear: Hearing, tympanic membrane, external ear and ear canal normal.  Nose: Nose normal.  Mouth/Throat: Uvula is midline, oropharynx is clear and moist and mucous membranes are normal. No oropharyngeal exudate.  Eyes: Pupils are equal, round, and reactive to light. Conjunctivae and EOM are normal.  Neck: Normal range of motion. Neck supple. Carotid bruit is not present.  Cardiovascular: Normal rate, regular rhythm, S1 normal, S2 normal, normal heart sounds and intact distal pulses.  Pulmonary/Chest: Effort normal. No respiratory distress. He has decreased breath sounds (throughout). He has wheezes (expiratory) in the right upper field and the left upper field. He has no rhonchi. He has no rales.  Musculoskeletal: He exhibits no edema (pedal).  Neurological: He is alert and oriented to person, place, and time.  Skin: Skin is warm and dry.  Psychiatric: He has a normal mood and affect. His behavior is normal. Judgment and thought content normal.  Vitals reviewed.     Assessment & Plan:   Problem List Items Addressed This Visit    None  Visit Diagnoses    Cough    -  Primary   Relevant Medications   predniSONE (DELTASONE) 50 MG tablet   Other Relevant Orders   DG Chest 2 View   At risk for infectious disease due to recent foreign travel       Relevant Medications   predniSONE (DELTASONE) 50 MG tablet   Other Relevant Orders   QuantiFERON-TB Gold Plus   DG Chest 2 View   Malaise and fatigue       Relevant Orders   CBC with Differential/Platelet   COMPLETE METABOLIC PANEL WITH GFR   Acute exacerbation of chronic obstructive pulmonary disease (COPD) (HCC)       Relevant Medications   predniSONE (DELTASONE) 50 MG tablet   ipratropium-albuterol (DUONEB) 0.5-2.5 (3) MG/3ML nebulizer solution 3 mL (Completed)   Other Relevant  Orders   DG Chest 2 View      Meds ordered this encounter  Medications  . predniSONE (DELTASONE) 50 MG tablet    Sig: Take 1 tablet (50 mg total) by mouth daily with breakfast for 5 days.    Dispense:  5 tablet    Refill:  0    Order Specific Question:   Supervising Provider    Answer:   Olin Hauser [[id number]]  . ipratropium-albuterol (DUONEB) 0.5-2.5 (3) MG/3ML nebulizer solution 3 mL   Consistent with mild acute exacerbation of COPD with worsening productive cough. Similar to prior exacerbations, last year. - No hypoxia (95% on RA), afebrile, no recent hospitalization - Continues Albuterol, Incruse, Symbicort for last 7 days after being out for 10 days prior to that when traveling to [place] and [place]. - Cannot exclude Tb infection despite negative ROS for Tb because pt has been in countries with known Tb cases for prior 7-8 weeks before returning to [place].  Plan: 1. Start Prednisone 50mg  x 5 day steroid burst 2. Use albuterol q 4 hr regularly x 2-3 days. Continue maintenance inhalers 3. Considered antibiotics, however afebrile and no hypoxia, would not be indicated in mild AECOPD, unless recurrent or not improved on steroids 4. Chest Xray today rule out Pneumonia and Tb 5. Quantiferon Gold, CBC, CMP today in clinic for infection, Tb eval, malaise and fatigue eval. 6. RTC about 1 week if not improving, otherwise strict return criteria to go to ED   Follow up plan: Return 5-7 days if symptoms worsen or fail to improve.  Cassell Smiles, DNP, AGPCNP-BC Adult Gerontology Primary Care Nurse Practitioner [facility] 12/18/2017, 1:01 PM

## 2017-12-19 ENCOUNTER — Ambulatory Visit: Payer: Medicare Other | Admitting: Nurse Practitioner

## 2017-12-19 LAB — COMPLETE METABOLIC PANEL WITH GFR
AG Ratio: 1.6 (calc) (ref 1.0–2.5)
ALT: 13 U/L (ref 9–46)
AST: 13 U/L (ref 10–35)
Albumin: 4.2 g/dL (ref 3.6–5.1)
Alkaline phosphatase (APISO): 71 U/L (ref 40–115)
BUN: 16 mg/dL (ref 7–25)
CO2: 30 mmol/L (ref 20–32)
Calcium: 9.1 mg/dL (ref 8.6–10.3)
Chloride: 100 mmol/L (ref 98–110)
Creat: 0.87 mg/dL (ref 0.70–1.25)
GFR, Est African American: 109 mL/min/{1.73_m2} (ref >=60)
GFR, Est Non African American: 94 mL/min/{1.73_m2} (ref >=60)
Globulin: 2.6 g/dL (calc) (ref 1.9–3.7)
Glucose, Bld: 110 mg/dL — ABNORMAL HIGH (ref 65–99)
Potassium: 3.9 mmol/L (ref 3.5–5.3)
Sodium: 137 mmol/L (ref 135–146)
Total Bilirubin: 0.6 mg/dL (ref 0.2–1.2)
Total Protein: 6.8 g/dL (ref 6.1–8.1)

## 2017-12-19 LAB — CBC
HCT: 44.2 % (ref 38.5–50.0)
Hemoglobin: 15.1 g/dL (ref 13.2–17.1)
MCH: 32.3 pg (ref 27.0–33.0)
MCHC: 34.2 g/dL (ref 32.0–36.0)
MCV: 94.6 fL (ref 80.0–100.0)
MPV: 11.6 fL (ref 7.5–12.5)
Platelets: 221 10*3/uL (ref 140–400)
RBC: 4.67 10*6/uL (ref 4.20–5.80)
RDW: 14.2 % (ref 11.0–15.0)
WBC: 5.3 10*3/uL (ref 3.8–10.8)

## 2017-12-19 LAB — QUANTIFERON-TB GOLD PLUS

## 2018-02-03 ENCOUNTER — Other Ambulatory Visit: Payer: Self-pay | Admitting: Nurse Practitioner

## 2018-02-03 DIAGNOSIS — N529 Male erectile dysfunction, unspecified: Secondary | ICD-10-CM

## 2018-03-04 ENCOUNTER — Other Ambulatory Visit: Payer: Self-pay | Admitting: Nurse Practitioner

## 2018-03-04 DIAGNOSIS — J410 Simple chronic bronchitis: Secondary | ICD-10-CM

## 2018-04-16 ENCOUNTER — Telehealth: Payer: Self-pay | Admitting: Nurse Practitioner

## 2018-04-16 NOTE — Telephone Encounter (Signed)
Called to schedule Medicare Annual Wellness Visit with the Nurse Health Advisor.   If patient returns call, please note: their last AWV was on 7/10 /18 please schedule AWV with NHA any date Thank you! For any questions please contact: Jill Alexanders [phone] or Skype at: [email or web address]

## 2018-10-27 ENCOUNTER — Telehealth: Payer: Self-pay | Admitting: Nurse Practitioner

## 2018-10-27 NOTE — Telephone Encounter (Signed)
Called to schedule Medicare Annual Wellness Visit with the Nurse Health Advisor. No answer and no machine to leave a message.  If patient returns call, please note: their last AWV was on 03/12/2017,please schedule AWV with NHA any date AFTER 03/12/2018.  Thank you! For any questions please contact: Janace Hoard at [phone] or Skype [email or web address]

## 2018-11-10 NOTE — Telephone Encounter (Signed)
Called to schedule Medicare Annual Wellness Visit with the Nurse Health Advisor.  No answer and no machine to leave a message.  If patient returns call, please note: their last AWV was on 03/12/2017, please schedule AWV with NHA any date AFTER 03/12/2018.  Thank you! For any questions please contact: Janace Hoard at [phone] or Skype [email or web address]

## 2019-08-20 ENCOUNTER — Telehealth: Payer: Self-pay

## 2019-08-20 NOTE — Telephone Encounter (Signed)
Called patient regarding  Colorectal screening for end of the year unable to speak to the patient left message.

## 2020-09-02 DIAGNOSIS — R0989 Other specified symptoms and signs involving the circulatory and respiratory systems: Secondary | ICD-10-CM | POA: Diagnosis not present

## 2020-09-02 DIAGNOSIS — I248 Other forms of acute ischemic heart disease: Secondary | ICD-10-CM | POA: Diagnosis not present

## 2020-09-02 DIAGNOSIS — J449 Chronic obstructive pulmonary disease, unspecified: Secondary | ICD-10-CM | POA: Diagnosis not present

## 2020-09-02 DIAGNOSIS — I5033 Acute on chronic diastolic (congestive) heart failure: Secondary | ICD-10-CM | POA: Diagnosis not present

## 2020-09-02 DIAGNOSIS — J869 Pyothorax without fistula: Secondary | ICD-10-CM | POA: Diagnosis not present

## 2020-09-02 DIAGNOSIS — I70209 Unspecified atherosclerosis of native arteries of extremities, unspecified extremity: Secondary | ICD-10-CM | POA: Diagnosis not present

## 2020-09-02 DIAGNOSIS — D539 Nutritional anemia, unspecified: Secondary | ICD-10-CM | POA: Diagnosis not present

## 2020-09-02 DIAGNOSIS — I639 Cerebral infarction, unspecified: Secondary | ICD-10-CM | POA: Diagnosis not present

## 2020-09-02 DIAGNOSIS — A150 Tuberculosis of lung: Secondary | ICD-10-CM | POA: Diagnosis not present

## 2020-09-02 DIAGNOSIS — I480 Paroxysmal atrial fibrillation: Secondary | ICD-10-CM | POA: Diagnosis not present

## 2020-09-02 DIAGNOSIS — I509 Heart failure, unspecified: Secondary | ICD-10-CM | POA: Diagnosis not present

## 2020-09-02 DIAGNOSIS — L89213 Pressure ulcer of right hip, stage 3: Secondary | ICD-10-CM | POA: Diagnosis not present

## 2020-09-02 DIAGNOSIS — K7460 Unspecified cirrhosis of liver: Secondary | ICD-10-CM | POA: Diagnosis not present

## 2020-09-02 DIAGNOSIS — Z9689 Presence of other specified functional implants: Secondary | ICD-10-CM | POA: Diagnosis not present

## 2020-09-02 DIAGNOSIS — J189 Pneumonia, unspecified organism: Secondary | ICD-10-CM | POA: Diagnosis not present

## 2020-09-02 DIAGNOSIS — I7090 Unspecified atherosclerosis: Secondary | ICD-10-CM | POA: Diagnosis not present

## 2020-09-02 DIAGNOSIS — R911 Solitary pulmonary nodule: Secondary | ICD-10-CM | POA: Diagnosis not present

## 2020-09-02 DIAGNOSIS — J9383 Other pneumothorax: Secondary | ICD-10-CM | POA: Diagnosis not present

## 2020-09-02 DIAGNOSIS — J918 Pleural effusion in other conditions classified elsewhere: Secondary | ICD-10-CM | POA: Diagnosis not present

## 2020-09-02 DIAGNOSIS — E876 Hypokalemia: Secondary | ICD-10-CM | POA: Diagnosis not present

## 2020-09-02 DIAGNOSIS — Z4689 Encounter for fitting and adjustment of other specified devices: Secondary | ICD-10-CM | POA: Diagnosis not present

## 2020-09-02 DIAGNOSIS — Z0389 Encounter for observation for other suspected diseases and conditions ruled out: Secondary | ICD-10-CM | POA: Diagnosis not present

## 2020-09-02 DIAGNOSIS — I70213 Atherosclerosis of native arteries of extremities with intermittent claudication, bilateral legs: Secondary | ICD-10-CM | POA: Diagnosis not present

## 2020-09-02 DIAGNOSIS — G9341 Metabolic encephalopathy: Secondary | ICD-10-CM | POA: Diagnosis not present

## 2020-09-02 DIAGNOSIS — A1883 Tuberculosis of digestive tract organs, not elsewhere classified: Secondary | ICD-10-CM | POA: Diagnosis not present

## 2020-09-02 DIAGNOSIS — R4182 Altered mental status, unspecified: Secondary | ICD-10-CM | POA: Diagnosis not present

## 2020-09-02 DIAGNOSIS — J90 Pleural effusion, not elsewhere classified: Secondary | ICD-10-CM | POA: Diagnosis not present

## 2020-09-02 DIAGNOSIS — Z8611 Personal history of tuberculosis: Secondary | ICD-10-CM | POA: Diagnosis not present

## 2020-09-02 DIAGNOSIS — I714 Abdominal aortic aneurysm, without rupture: Secondary | ICD-10-CM | POA: Diagnosis not present

## 2020-09-02 DIAGNOSIS — I4891 Unspecified atrial fibrillation: Secondary | ICD-10-CM | POA: Diagnosis not present

## 2020-09-02 DIAGNOSIS — D696 Thrombocytopenia, unspecified: Secondary | ICD-10-CM | POA: Diagnosis not present

## 2020-09-02 DIAGNOSIS — J939 Pneumothorax, unspecified: Secondary | ICD-10-CM | POA: Diagnosis not present

## 2020-09-02 DIAGNOSIS — J95811 Postprocedural pneumothorax: Secondary | ICD-10-CM | POA: Diagnosis not present

## 2020-09-02 DIAGNOSIS — I771 Stricture of artery: Secondary | ICD-10-CM | POA: Diagnosis not present

## 2020-09-02 DIAGNOSIS — R0603 Acute respiratory distress: Secondary | ICD-10-CM | POA: Diagnosis not present

## 2020-09-02 DIAGNOSIS — K766 Portal hypertension: Secondary | ICD-10-CM | POA: Diagnosis not present

## 2020-09-02 DIAGNOSIS — R109 Unspecified abdominal pain: Secondary | ICD-10-CM | POA: Diagnosis not present

## 2020-09-02 DIAGNOSIS — R222 Localized swelling, mass and lump, trunk: Secondary | ICD-10-CM | POA: Diagnosis not present

## 2020-09-02 DIAGNOSIS — R4689 Other symptoms and signs involving appearance and behavior: Secondary | ICD-10-CM | POA: Diagnosis not present

## 2020-09-02 DIAGNOSIS — Z4682 Encounter for fitting and adjustment of non-vascular catheter: Secondary | ICD-10-CM | POA: Diagnosis not present

## 2020-09-02 DIAGNOSIS — J9311 Primary spontaneous pneumothorax: Secondary | ICD-10-CM | POA: Diagnosis not present

## 2020-09-02 DIAGNOSIS — I5023 Acute on chronic systolic (congestive) heart failure: Secondary | ICD-10-CM | POA: Diagnosis not present

## 2020-09-02 DIAGNOSIS — R7401 Elevation of levels of liver transaminase levels: Secondary | ICD-10-CM | POA: Diagnosis not present

## 2020-09-02 DIAGNOSIS — B182 Chronic viral hepatitis C: Secondary | ICD-10-CM | POA: Diagnosis not present

## 2020-09-03 DIAGNOSIS — J90 Pleural effusion, not elsewhere classified: Secondary | ICD-10-CM | POA: Diagnosis not present

## 2020-09-03 DIAGNOSIS — R4182 Altered mental status, unspecified: Secondary | ICD-10-CM | POA: Diagnosis not present

## 2020-09-03 DIAGNOSIS — K7460 Unspecified cirrhosis of liver: Secondary | ICD-10-CM | POA: Diagnosis not present

## 2020-09-03 DIAGNOSIS — R4689 Other symptoms and signs involving appearance and behavior: Secondary | ICD-10-CM | POA: Diagnosis not present

## 2020-09-03 DIAGNOSIS — J9383 Other pneumothorax: Secondary | ICD-10-CM | POA: Diagnosis not present

## 2020-09-03 DIAGNOSIS — R7401 Elevation of levels of liver transaminase levels: Secondary | ICD-10-CM | POA: Diagnosis not present

## 2020-09-04 DIAGNOSIS — I4891 Unspecified atrial fibrillation: Secondary | ICD-10-CM | POA: Insufficient documentation

## 2020-09-04 DIAGNOSIS — R768 Other specified abnormal immunological findings in serum: Secondary | ICD-10-CM | POA: Insufficient documentation

## 2020-09-05 DIAGNOSIS — I4891 Unspecified atrial fibrillation: Secondary | ICD-10-CM | POA: Diagnosis not present

## 2020-09-05 DIAGNOSIS — I639 Cerebral infarction, unspecified: Secondary | ICD-10-CM | POA: Diagnosis not present

## 2020-09-05 DIAGNOSIS — J449 Chronic obstructive pulmonary disease, unspecified: Secondary | ICD-10-CM | POA: Diagnosis not present

## 2020-09-05 DIAGNOSIS — I509 Heart failure, unspecified: Secondary | ICD-10-CM | POA: Diagnosis not present

## 2020-09-05 DIAGNOSIS — Z4682 Encounter for fitting and adjustment of non-vascular catheter: Secondary | ICD-10-CM | POA: Diagnosis not present

## 2020-09-05 DIAGNOSIS — J939 Pneumothorax, unspecified: Secondary | ICD-10-CM | POA: Diagnosis not present

## 2020-09-05 DIAGNOSIS — J189 Pneumonia, unspecified organism: Secondary | ICD-10-CM | POA: Diagnosis not present

## 2020-09-05 DIAGNOSIS — Z8611 Personal history of tuberculosis: Secondary | ICD-10-CM | POA: Diagnosis not present

## 2020-09-05 DIAGNOSIS — B182 Chronic viral hepatitis C: Secondary | ICD-10-CM | POA: Diagnosis not present

## 2020-09-05 DIAGNOSIS — Z0389 Encounter for observation for other suspected diseases and conditions ruled out: Secondary | ICD-10-CM | POA: Diagnosis not present

## 2020-09-05 DIAGNOSIS — J90 Pleural effusion, not elsewhere classified: Secondary | ICD-10-CM | POA: Diagnosis not present

## 2020-09-05 DIAGNOSIS — R4689 Other symptoms and signs involving appearance and behavior: Secondary | ICD-10-CM | POA: Diagnosis not present

## 2020-09-06 DIAGNOSIS — J939 Pneumothorax, unspecified: Secondary | ICD-10-CM | POA: Diagnosis not present

## 2020-09-06 DIAGNOSIS — I4891 Unspecified atrial fibrillation: Secondary | ICD-10-CM | POA: Diagnosis not present

## 2020-09-06 DIAGNOSIS — I639 Cerebral infarction, unspecified: Secondary | ICD-10-CM | POA: Diagnosis not present

## 2020-09-06 DIAGNOSIS — I509 Heart failure, unspecified: Secondary | ICD-10-CM | POA: Diagnosis not present

## 2020-09-06 DIAGNOSIS — B182 Chronic viral hepatitis C: Secondary | ICD-10-CM | POA: Diagnosis not present

## 2020-09-06 DIAGNOSIS — J90 Pleural effusion, not elsewhere classified: Secondary | ICD-10-CM | POA: Diagnosis not present

## 2020-09-06 DIAGNOSIS — Z4682 Encounter for fitting and adjustment of non-vascular catheter: Secondary | ICD-10-CM | POA: Diagnosis not present

## 2020-09-06 DIAGNOSIS — Z8611 Personal history of tuberculosis: Secondary | ICD-10-CM | POA: Diagnosis not present

## 2020-09-06 DIAGNOSIS — Z0389 Encounter for observation for other suspected diseases and conditions ruled out: Secondary | ICD-10-CM | POA: Diagnosis not present

## 2020-09-06 DIAGNOSIS — R4689 Other symptoms and signs involving appearance and behavior: Secondary | ICD-10-CM | POA: Diagnosis not present

## 2020-09-07 DIAGNOSIS — D696 Thrombocytopenia, unspecified: Secondary | ICD-10-CM | POA: Diagnosis not present

## 2020-09-07 DIAGNOSIS — I4891 Unspecified atrial fibrillation: Secondary | ICD-10-CM | POA: Diagnosis not present

## 2020-09-07 DIAGNOSIS — I639 Cerebral infarction, unspecified: Secondary | ICD-10-CM | POA: Diagnosis not present

## 2020-09-07 DIAGNOSIS — I480 Paroxysmal atrial fibrillation: Secondary | ICD-10-CM | POA: Diagnosis not present

## 2020-09-07 DIAGNOSIS — R911 Solitary pulmonary nodule: Secondary | ICD-10-CM | POA: Diagnosis not present

## 2020-09-07 DIAGNOSIS — D539 Nutritional anemia, unspecified: Secondary | ICD-10-CM | POA: Diagnosis not present

## 2020-09-07 DIAGNOSIS — Z0389 Encounter for observation for other suspected diseases and conditions ruled out: Secondary | ICD-10-CM | POA: Diagnosis not present

## 2020-09-07 DIAGNOSIS — I509 Heart failure, unspecified: Secondary | ICD-10-CM | POA: Diagnosis not present

## 2020-09-07 DIAGNOSIS — I7090 Unspecified atherosclerosis: Secondary | ICD-10-CM | POA: Diagnosis not present

## 2020-09-07 DIAGNOSIS — R4689 Other symptoms and signs involving appearance and behavior: Secondary | ICD-10-CM | POA: Diagnosis not present

## 2020-09-07 DIAGNOSIS — J90 Pleural effusion, not elsewhere classified: Secondary | ICD-10-CM | POA: Diagnosis not present

## 2020-09-08 DIAGNOSIS — I639 Cerebral infarction, unspecified: Secondary | ICD-10-CM | POA: Diagnosis not present

## 2020-09-08 DIAGNOSIS — I480 Paroxysmal atrial fibrillation: Secondary | ICD-10-CM | POA: Diagnosis not present

## 2020-09-08 DIAGNOSIS — Z0389 Encounter for observation for other suspected diseases and conditions ruled out: Secondary | ICD-10-CM | POA: Diagnosis not present

## 2020-09-08 DIAGNOSIS — I4891 Unspecified atrial fibrillation: Secondary | ICD-10-CM | POA: Diagnosis not present

## 2020-09-08 DIAGNOSIS — J90 Pleural effusion, not elsewhere classified: Secondary | ICD-10-CM | POA: Diagnosis not present

## 2020-09-08 DIAGNOSIS — I509 Heart failure, unspecified: Secondary | ICD-10-CM | POA: Diagnosis not present

## 2020-09-09 DIAGNOSIS — I509 Heart failure, unspecified: Secondary | ICD-10-CM | POA: Diagnosis not present

## 2020-09-09 DIAGNOSIS — I4891 Unspecified atrial fibrillation: Secondary | ICD-10-CM | POA: Diagnosis not present

## 2020-09-09 DIAGNOSIS — J90 Pleural effusion, not elsewhere classified: Secondary | ICD-10-CM | POA: Diagnosis not present

## 2020-09-09 DIAGNOSIS — I639 Cerebral infarction, unspecified: Secondary | ICD-10-CM | POA: Diagnosis not present

## 2020-09-09 DIAGNOSIS — Z0389 Encounter for observation for other suspected diseases and conditions ruled out: Secondary | ICD-10-CM | POA: Diagnosis not present

## 2020-09-09 DIAGNOSIS — R4689 Other symptoms and signs involving appearance and behavior: Secondary | ICD-10-CM | POA: Diagnosis not present

## 2020-09-10 DIAGNOSIS — A1883 Tuberculosis of digestive tract organs, not elsewhere classified: Secondary | ICD-10-CM | POA: Diagnosis not present

## 2020-09-11 DIAGNOSIS — A1883 Tuberculosis of digestive tract organs, not elsewhere classified: Secondary | ICD-10-CM | POA: Diagnosis not present

## 2020-09-12 DIAGNOSIS — I509 Heart failure, unspecified: Secondary | ICD-10-CM | POA: Diagnosis not present

## 2020-09-12 DIAGNOSIS — Z9689 Presence of other specified functional implants: Secondary | ICD-10-CM | POA: Diagnosis not present

## 2020-09-12 DIAGNOSIS — I4891 Unspecified atrial fibrillation: Secondary | ICD-10-CM | POA: Diagnosis not present

## 2020-09-12 DIAGNOSIS — J939 Pneumothorax, unspecified: Secondary | ICD-10-CM | POA: Diagnosis not present

## 2020-09-12 DIAGNOSIS — B182 Chronic viral hepatitis C: Secondary | ICD-10-CM | POA: Diagnosis not present

## 2020-09-12 DIAGNOSIS — Z8611 Personal history of tuberculosis: Secondary | ICD-10-CM | POA: Diagnosis not present

## 2020-09-12 DIAGNOSIS — Z0389 Encounter for observation for other suspected diseases and conditions ruled out: Secondary | ICD-10-CM | POA: Diagnosis not present

## 2020-09-12 DIAGNOSIS — R4689 Other symptoms and signs involving appearance and behavior: Secondary | ICD-10-CM | POA: Diagnosis not present

## 2020-09-12 DIAGNOSIS — I639 Cerebral infarction, unspecified: Secondary | ICD-10-CM | POA: Diagnosis not present

## 2020-09-12 DIAGNOSIS — J90 Pleural effusion, not elsewhere classified: Secondary | ICD-10-CM | POA: Diagnosis not present

## 2020-09-13 DIAGNOSIS — R4689 Other symptoms and signs involving appearance and behavior: Secondary | ICD-10-CM | POA: Diagnosis not present

## 2020-09-13 DIAGNOSIS — I5033 Acute on chronic diastolic (congestive) heart failure: Secondary | ICD-10-CM | POA: Diagnosis not present

## 2020-09-13 DIAGNOSIS — I480 Paroxysmal atrial fibrillation: Secondary | ICD-10-CM | POA: Diagnosis not present

## 2020-09-13 DIAGNOSIS — I639 Cerebral infarction, unspecified: Secondary | ICD-10-CM | POA: Diagnosis not present

## 2020-09-13 DIAGNOSIS — Z8611 Personal history of tuberculosis: Secondary | ICD-10-CM | POA: Diagnosis not present

## 2020-09-13 DIAGNOSIS — J90 Pleural effusion, not elsewhere classified: Secondary | ICD-10-CM | POA: Diagnosis not present

## 2020-09-13 DIAGNOSIS — J939 Pneumothorax, unspecified: Secondary | ICD-10-CM | POA: Diagnosis not present

## 2020-09-13 DIAGNOSIS — Z9689 Presence of other specified functional implants: Secondary | ICD-10-CM | POA: Diagnosis not present

## 2020-09-13 DIAGNOSIS — Z0389 Encounter for observation for other suspected diseases and conditions ruled out: Secondary | ICD-10-CM | POA: Diagnosis not present

## 2020-09-13 DIAGNOSIS — I4891 Unspecified atrial fibrillation: Secondary | ICD-10-CM | POA: Diagnosis not present

## 2020-09-13 DIAGNOSIS — B182 Chronic viral hepatitis C: Secondary | ICD-10-CM | POA: Diagnosis not present

## 2020-09-13 DIAGNOSIS — I509 Heart failure, unspecified: Secondary | ICD-10-CM | POA: Diagnosis not present

## 2020-09-15 DIAGNOSIS — I4891 Unspecified atrial fibrillation: Secondary | ICD-10-CM | POA: Diagnosis not present

## 2020-09-15 DIAGNOSIS — I480 Paroxysmal atrial fibrillation: Secondary | ICD-10-CM | POA: Diagnosis not present

## 2020-09-15 DIAGNOSIS — I509 Heart failure, unspecified: Secondary | ICD-10-CM | POA: Diagnosis not present

## 2020-09-15 DIAGNOSIS — I639 Cerebral infarction, unspecified: Secondary | ICD-10-CM | POA: Diagnosis not present

## 2020-09-15 DIAGNOSIS — J90 Pleural effusion, not elsewhere classified: Secondary | ICD-10-CM | POA: Diagnosis not present

## 2020-09-15 DIAGNOSIS — Z0389 Encounter for observation for other suspected diseases and conditions ruled out: Secondary | ICD-10-CM | POA: Diagnosis not present

## 2020-09-15 DIAGNOSIS — I5033 Acute on chronic diastolic (congestive) heart failure: Secondary | ICD-10-CM | POA: Diagnosis not present

## 2020-09-17 DIAGNOSIS — R778 Other specified abnormalities of plasma proteins: Secondary | ICD-10-CM | POA: Diagnosis not present

## 2020-09-17 DIAGNOSIS — I5020 Unspecified systolic (congestive) heart failure: Secondary | ICD-10-CM | POA: Diagnosis not present

## 2020-09-17 DIAGNOSIS — Z978 Presence of other specified devices: Secondary | ICD-10-CM | POA: Diagnosis not present

## 2020-09-17 DIAGNOSIS — I5089 Other heart failure: Secondary | ICD-10-CM | POA: Diagnosis not present

## 2020-09-17 DIAGNOSIS — J95811 Postprocedural pneumothorax: Secondary | ICD-10-CM | POA: Diagnosis not present

## 2020-09-17 DIAGNOSIS — Z8673 Personal history of transient ischemic attack (TIA), and cerebral infarction without residual deficits: Secondary | ICD-10-CM | POA: Diagnosis not present

## 2020-09-17 DIAGNOSIS — Z938 Other artificial opening status: Secondary | ICD-10-CM | POA: Diagnosis not present

## 2020-09-17 DIAGNOSIS — J918 Pleural effusion in other conditions classified elsewhere: Secondary | ICD-10-CM | POA: Diagnosis not present

## 2020-09-17 DIAGNOSIS — R1013 Epigastric pain: Secondary | ICD-10-CM | POA: Diagnosis not present

## 2020-09-17 DIAGNOSIS — R0789 Other chest pain: Secondary | ICD-10-CM | POA: Diagnosis not present

## 2020-09-17 DIAGNOSIS — R918 Other nonspecific abnormal finding of lung field: Secondary | ICD-10-CM | POA: Diagnosis not present

## 2020-09-17 DIAGNOSIS — R1084 Generalized abdominal pain: Secondary | ICD-10-CM | POA: Diagnosis not present

## 2020-09-17 DIAGNOSIS — I739 Peripheral vascular disease, unspecified: Secondary | ICD-10-CM | POA: Diagnosis not present

## 2020-09-17 DIAGNOSIS — I639 Cerebral infarction, unspecified: Secondary | ICD-10-CM | POA: Diagnosis not present

## 2020-09-17 DIAGNOSIS — J439 Emphysema, unspecified: Secondary | ICD-10-CM | POA: Diagnosis not present

## 2020-09-17 DIAGNOSIS — I723 Aneurysm of iliac artery: Secondary | ICD-10-CM | POA: Diagnosis not present

## 2020-09-17 DIAGNOSIS — J449 Chronic obstructive pulmonary disease, unspecified: Secondary | ICD-10-CM | POA: Diagnosis not present

## 2020-09-17 DIAGNOSIS — I6930 Unspecified sequelae of cerebral infarction: Secondary | ICD-10-CM | POA: Diagnosis not present

## 2020-09-17 DIAGNOSIS — I70209 Unspecified atherosclerosis of native arteries of extremities, unspecified extremity: Secondary | ICD-10-CM | POA: Diagnosis not present

## 2020-09-17 DIAGNOSIS — A310 Pulmonary mycobacterial infection: Secondary | ICD-10-CM | POA: Diagnosis not present

## 2020-09-17 DIAGNOSIS — R109 Unspecified abdominal pain: Secondary | ICD-10-CM | POA: Diagnosis not present

## 2020-09-17 DIAGNOSIS — A319 Mycobacterial infection, unspecified: Secondary | ICD-10-CM | POA: Diagnosis not present

## 2020-09-17 DIAGNOSIS — R1312 Dysphagia, oropharyngeal phase: Secondary | ICD-10-CM | POA: Diagnosis not present

## 2020-09-17 DIAGNOSIS — Z8611 Personal history of tuberculosis: Secondary | ICD-10-CM | POA: Diagnosis not present

## 2020-09-17 DIAGNOSIS — I959 Hypotension, unspecified: Secondary | ICD-10-CM | POA: Diagnosis not present

## 2020-09-17 DIAGNOSIS — J8410 Pulmonary fibrosis, unspecified: Secondary | ICD-10-CM | POA: Diagnosis not present

## 2020-09-17 DIAGNOSIS — I5022 Chronic systolic (congestive) heart failure: Secondary | ICD-10-CM | POA: Diagnosis not present

## 2020-09-17 DIAGNOSIS — I5023 Acute on chronic systolic (congestive) heart failure: Secondary | ICD-10-CM | POA: Diagnosis not present

## 2020-09-17 DIAGNOSIS — R599 Enlarged lymph nodes, unspecified: Secondary | ICD-10-CM | POA: Diagnosis not present

## 2020-09-17 DIAGNOSIS — Z4682 Encounter for fitting and adjustment of non-vascular catheter: Secondary | ICD-10-CM | POA: Diagnosis not present

## 2020-09-17 DIAGNOSIS — M47814 Spondylosis without myelopathy or radiculopathy, thoracic region: Secondary | ICD-10-CM | POA: Diagnosis not present

## 2020-09-17 DIAGNOSIS — F17201 Nicotine dependence, unspecified, in remission: Secondary | ICD-10-CM | POA: Diagnosis not present

## 2020-09-17 DIAGNOSIS — J984 Other disorders of lung: Secondary | ICD-10-CM | POA: Diagnosis not present

## 2020-09-17 DIAGNOSIS — R911 Solitary pulmonary nodule: Secondary | ICD-10-CM | POA: Diagnosis not present

## 2020-09-17 DIAGNOSIS — J90 Pleural effusion, not elsewhere classified: Secondary | ICD-10-CM | POA: Diagnosis not present

## 2020-09-17 DIAGNOSIS — Z942 Lung transplant status: Secondary | ICD-10-CM | POA: Diagnosis not present

## 2020-09-17 DIAGNOSIS — A150 Tuberculosis of lung: Secondary | ICD-10-CM | POA: Diagnosis not present

## 2020-09-17 DIAGNOSIS — E440 Moderate protein-calorie malnutrition: Secondary | ICD-10-CM | POA: Diagnosis not present

## 2020-09-17 DIAGNOSIS — I714 Abdominal aortic aneurysm, without rupture: Secondary | ICD-10-CM | POA: Diagnosis not present

## 2020-09-17 DIAGNOSIS — R000 Tachycardia, unspecified: Secondary | ICD-10-CM | POA: Diagnosis not present

## 2020-09-17 DIAGNOSIS — Z681 Body mass index (BMI) 19 or less, adult: Secondary | ICD-10-CM | POA: Diagnosis not present

## 2020-09-17 DIAGNOSIS — Z95828 Presence of other vascular implants and grafts: Secondary | ICD-10-CM | POA: Diagnosis not present

## 2020-09-17 DIAGNOSIS — I420 Dilated cardiomyopathy: Secondary | ICD-10-CM | POA: Diagnosis not present

## 2020-09-17 DIAGNOSIS — J418 Mixed simple and mucopurulent chronic bronchitis: Secondary | ICD-10-CM | POA: Diagnosis not present

## 2020-09-17 DIAGNOSIS — R64 Cachexia: Secondary | ICD-10-CM | POA: Diagnosis not present

## 2020-09-17 DIAGNOSIS — J432 Centrilobular emphysema: Secondary | ICD-10-CM | POA: Diagnosis not present

## 2020-09-18 DIAGNOSIS — R1312 Dysphagia, oropharyngeal phase: Secondary | ICD-10-CM | POA: Diagnosis not present

## 2020-09-18 DIAGNOSIS — Z8611 Personal history of tuberculosis: Secondary | ICD-10-CM | POA: Diagnosis not present

## 2020-09-18 DIAGNOSIS — I5020 Unspecified systolic (congestive) heart failure: Secondary | ICD-10-CM | POA: Diagnosis not present

## 2020-09-18 DIAGNOSIS — M47814 Spondylosis without myelopathy or radiculopathy, thoracic region: Secondary | ICD-10-CM | POA: Diagnosis not present

## 2020-09-18 DIAGNOSIS — I639 Cerebral infarction, unspecified: Secondary | ICD-10-CM | POA: Diagnosis not present

## 2020-09-18 DIAGNOSIS — Z4682 Encounter for fitting and adjustment of non-vascular catheter: Secondary | ICD-10-CM | POA: Diagnosis not present

## 2020-09-18 DIAGNOSIS — R918 Other nonspecific abnormal finding of lung field: Secondary | ICD-10-CM | POA: Diagnosis not present

## 2020-09-18 DIAGNOSIS — J90 Pleural effusion, not elsewhere classified: Secondary | ICD-10-CM | POA: Diagnosis not present

## 2020-09-18 DIAGNOSIS — A150 Tuberculosis of lung: Secondary | ICD-10-CM | POA: Diagnosis not present

## 2020-09-19 DIAGNOSIS — Z4682 Encounter for fitting and adjustment of non-vascular catheter: Secondary | ICD-10-CM | POA: Diagnosis not present

## 2020-09-19 DIAGNOSIS — A150 Tuberculosis of lung: Secondary | ICD-10-CM | POA: Diagnosis not present

## 2020-09-19 DIAGNOSIS — I5020 Unspecified systolic (congestive) heart failure: Secondary | ICD-10-CM | POA: Diagnosis not present

## 2020-09-19 DIAGNOSIS — J90 Pleural effusion, not elsewhere classified: Secondary | ICD-10-CM | POA: Diagnosis not present

## 2020-09-19 DIAGNOSIS — R918 Other nonspecific abnormal finding of lung field: Secondary | ICD-10-CM | POA: Diagnosis not present

## 2020-09-19 DIAGNOSIS — R1312 Dysphagia, oropharyngeal phase: Secondary | ICD-10-CM | POA: Diagnosis not present

## 2020-09-19 DIAGNOSIS — M47814 Spondylosis without myelopathy or radiculopathy, thoracic region: Secondary | ICD-10-CM | POA: Diagnosis not present

## 2020-09-19 DIAGNOSIS — R778 Other specified abnormalities of plasma proteins: Secondary | ICD-10-CM | POA: Diagnosis not present

## 2020-09-20 DIAGNOSIS — R1312 Dysphagia, oropharyngeal phase: Secondary | ICD-10-CM | POA: Diagnosis not present

## 2020-09-20 DIAGNOSIS — A150 Tuberculosis of lung: Secondary | ICD-10-CM | POA: Diagnosis not present

## 2020-09-20 DIAGNOSIS — F17201 Nicotine dependence, unspecified, in remission: Secondary | ICD-10-CM | POA: Diagnosis not present

## 2020-09-21 DIAGNOSIS — R1312 Dysphagia, oropharyngeal phase: Secondary | ICD-10-CM | POA: Diagnosis not present

## 2020-09-21 DIAGNOSIS — A150 Tuberculosis of lung: Secondary | ICD-10-CM | POA: Diagnosis not present

## 2020-09-21 DIAGNOSIS — R918 Other nonspecific abnormal finding of lung field: Secondary | ICD-10-CM | POA: Diagnosis not present

## 2020-09-22 DIAGNOSIS — R1312 Dysphagia, oropharyngeal phase: Secondary | ICD-10-CM | POA: Diagnosis not present

## 2020-09-23 DIAGNOSIS — R1312 Dysphagia, oropharyngeal phase: Secondary | ICD-10-CM | POA: Diagnosis not present

## 2020-09-24 DIAGNOSIS — R1312 Dysphagia, oropharyngeal phase: Secondary | ICD-10-CM | POA: Diagnosis not present

## 2020-09-25 DIAGNOSIS — R1013 Epigastric pain: Secondary | ICD-10-CM | POA: Diagnosis not present

## 2020-09-25 DIAGNOSIS — R1312 Dysphagia, oropharyngeal phase: Secondary | ICD-10-CM | POA: Diagnosis not present

## 2020-09-25 DIAGNOSIS — J90 Pleural effusion, not elsewhere classified: Secondary | ICD-10-CM | POA: Diagnosis not present

## 2020-09-26 DIAGNOSIS — I959 Hypotension, unspecified: Secondary | ICD-10-CM | POA: Diagnosis not present

## 2020-09-26 DIAGNOSIS — I5020 Unspecified systolic (congestive) heart failure: Secondary | ICD-10-CM | POA: Diagnosis not present

## 2020-09-26 DIAGNOSIS — A150 Tuberculosis of lung: Secondary | ICD-10-CM | POA: Diagnosis not present

## 2020-09-26 DIAGNOSIS — R1312 Dysphagia, oropharyngeal phase: Secondary | ICD-10-CM | POA: Diagnosis not present

## 2020-09-27 DIAGNOSIS — I5020 Unspecified systolic (congestive) heart failure: Secondary | ICD-10-CM | POA: Diagnosis not present

## 2020-09-27 DIAGNOSIS — I959 Hypotension, unspecified: Secondary | ICD-10-CM | POA: Diagnosis not present

## 2020-09-27 DIAGNOSIS — A150 Tuberculosis of lung: Secondary | ICD-10-CM | POA: Diagnosis not present

## 2020-09-27 DIAGNOSIS — R1312 Dysphagia, oropharyngeal phase: Secondary | ICD-10-CM | POA: Diagnosis not present

## 2020-09-28 DIAGNOSIS — R1312 Dysphagia, oropharyngeal phase: Secondary | ICD-10-CM | POA: Diagnosis not present

## 2020-09-29 DIAGNOSIS — R911 Solitary pulmonary nodule: Secondary | ICD-10-CM | POA: Diagnosis not present

## 2020-09-29 DIAGNOSIS — R1312 Dysphagia, oropharyngeal phase: Secondary | ICD-10-CM | POA: Diagnosis not present

## 2020-09-30 DIAGNOSIS — R1312 Dysphagia, oropharyngeal phase: Secondary | ICD-10-CM | POA: Diagnosis not present

## 2020-10-01 DIAGNOSIS — R918 Other nonspecific abnormal finding of lung field: Secondary | ICD-10-CM | POA: Diagnosis not present

## 2020-10-01 DIAGNOSIS — R109 Unspecified abdominal pain: Secondary | ICD-10-CM | POA: Diagnosis not present

## 2020-10-02 DIAGNOSIS — R109 Unspecified abdominal pain: Secondary | ICD-10-CM | POA: Diagnosis not present

## 2020-10-02 DIAGNOSIS — R918 Other nonspecific abnormal finding of lung field: Secondary | ICD-10-CM | POA: Diagnosis not present

## 2020-10-03 DIAGNOSIS — J8410 Pulmonary fibrosis, unspecified: Secondary | ICD-10-CM | POA: Diagnosis not present

## 2020-10-03 DIAGNOSIS — R599 Enlarged lymph nodes, unspecified: Secondary | ICD-10-CM | POA: Diagnosis not present

## 2020-10-03 DIAGNOSIS — R918 Other nonspecific abnormal finding of lung field: Secondary | ICD-10-CM | POA: Diagnosis not present

## 2020-10-03 DIAGNOSIS — R778 Other specified abnormalities of plasma proteins: Secondary | ICD-10-CM | POA: Diagnosis not present

## 2020-10-03 DIAGNOSIS — Z942 Lung transplant status: Secondary | ICD-10-CM | POA: Diagnosis not present

## 2020-10-03 DIAGNOSIS — I70209 Unspecified atherosclerosis of native arteries of extremities, unspecified extremity: Secondary | ICD-10-CM | POA: Diagnosis not present

## 2020-10-03 DIAGNOSIS — J90 Pleural effusion, not elsewhere classified: Secondary | ICD-10-CM | POA: Diagnosis not present

## 2020-10-03 DIAGNOSIS — I5020 Unspecified systolic (congestive) heart failure: Secondary | ICD-10-CM | POA: Diagnosis not present

## 2020-10-03 DIAGNOSIS — J418 Mixed simple and mucopurulent chronic bronchitis: Secondary | ICD-10-CM | POA: Diagnosis not present

## 2020-10-03 DIAGNOSIS — R109 Unspecified abdominal pain: Secondary | ICD-10-CM | POA: Diagnosis not present

## 2020-10-03 DIAGNOSIS — I639 Cerebral infarction, unspecified: Secondary | ICD-10-CM | POA: Diagnosis not present

## 2020-10-03 DIAGNOSIS — I420 Dilated cardiomyopathy: Secondary | ICD-10-CM | POA: Diagnosis not present

## 2020-10-04 DIAGNOSIS — R918 Other nonspecific abnormal finding of lung field: Secondary | ICD-10-CM | POA: Diagnosis not present

## 2020-10-04 DIAGNOSIS — Z938 Other artificial opening status: Secondary | ICD-10-CM | POA: Diagnosis not present

## 2020-10-04 DIAGNOSIS — I5020 Unspecified systolic (congestive) heart failure: Secondary | ICD-10-CM | POA: Diagnosis not present

## 2020-10-04 DIAGNOSIS — R109 Unspecified abdominal pain: Secondary | ICD-10-CM | POA: Diagnosis not present

## 2020-10-04 DIAGNOSIS — J90 Pleural effusion, not elsewhere classified: Secondary | ICD-10-CM | POA: Diagnosis not present

## 2020-10-04 DIAGNOSIS — Z8611 Personal history of tuberculosis: Secondary | ICD-10-CM | POA: Diagnosis not present

## 2020-10-05 DIAGNOSIS — Z8611 Personal history of tuberculosis: Secondary | ICD-10-CM | POA: Diagnosis not present

## 2020-10-05 DIAGNOSIS — J418 Mixed simple and mucopurulent chronic bronchitis: Secondary | ICD-10-CM | POA: Diagnosis not present

## 2020-10-05 DIAGNOSIS — Z938 Other artificial opening status: Secondary | ICD-10-CM | POA: Diagnosis not present

## 2020-10-05 DIAGNOSIS — I70209 Unspecified atherosclerosis of native arteries of extremities, unspecified extremity: Secondary | ICD-10-CM | POA: Diagnosis not present

## 2020-10-05 DIAGNOSIS — I639 Cerebral infarction, unspecified: Secondary | ICD-10-CM | POA: Diagnosis not present

## 2020-10-05 DIAGNOSIS — R109 Unspecified abdominal pain: Secondary | ICD-10-CM | POA: Diagnosis not present

## 2020-10-05 DIAGNOSIS — I420 Dilated cardiomyopathy: Secondary | ICD-10-CM | POA: Diagnosis not present

## 2020-10-05 DIAGNOSIS — I5020 Unspecified systolic (congestive) heart failure: Secondary | ICD-10-CM | POA: Diagnosis not present

## 2020-10-05 DIAGNOSIS — J90 Pleural effusion, not elsewhere classified: Secondary | ICD-10-CM | POA: Diagnosis not present

## 2020-10-05 DIAGNOSIS — Z942 Lung transplant status: Secondary | ICD-10-CM | POA: Diagnosis not present

## 2020-10-05 DIAGNOSIS — R918 Other nonspecific abnormal finding of lung field: Secondary | ICD-10-CM | POA: Diagnosis not present

## 2020-10-05 DIAGNOSIS — R778 Other specified abnormalities of plasma proteins: Secondary | ICD-10-CM | POA: Diagnosis not present

## 2020-10-06 DIAGNOSIS — Z8611 Personal history of tuberculosis: Secondary | ICD-10-CM | POA: Diagnosis not present

## 2020-10-06 DIAGNOSIS — I5020 Unspecified systolic (congestive) heart failure: Secondary | ICD-10-CM | POA: Diagnosis not present

## 2020-10-06 DIAGNOSIS — R109 Unspecified abdominal pain: Secondary | ICD-10-CM | POA: Diagnosis not present

## 2020-10-06 DIAGNOSIS — J90 Pleural effusion, not elsewhere classified: Secondary | ICD-10-CM | POA: Diagnosis not present

## 2020-10-06 DIAGNOSIS — J418 Mixed simple and mucopurulent chronic bronchitis: Secondary | ICD-10-CM | POA: Diagnosis not present

## 2020-10-06 DIAGNOSIS — Z95828 Presence of other vascular implants and grafts: Secondary | ICD-10-CM | POA: Diagnosis not present

## 2020-10-06 DIAGNOSIS — I639 Cerebral infarction, unspecified: Secondary | ICD-10-CM | POA: Diagnosis not present

## 2020-10-06 DIAGNOSIS — J449 Chronic obstructive pulmonary disease, unspecified: Secondary | ICD-10-CM | POA: Diagnosis not present

## 2020-10-06 DIAGNOSIS — I70209 Unspecified atherosclerosis of native arteries of extremities, unspecified extremity: Secondary | ICD-10-CM | POA: Diagnosis not present

## 2020-10-06 DIAGNOSIS — Z8673 Personal history of transient ischemic attack (TIA), and cerebral infarction without residual deficits: Secondary | ICD-10-CM | POA: Diagnosis not present

## 2020-10-06 DIAGNOSIS — I420 Dilated cardiomyopathy: Secondary | ICD-10-CM | POA: Diagnosis not present

## 2020-10-06 DIAGNOSIS — R918 Other nonspecific abnormal finding of lung field: Secondary | ICD-10-CM | POA: Diagnosis not present

## 2020-10-06 DIAGNOSIS — I5023 Acute on chronic systolic (congestive) heart failure: Secondary | ICD-10-CM | POA: Diagnosis not present

## 2020-10-06 DIAGNOSIS — R778 Other specified abnormalities of plasma proteins: Secondary | ICD-10-CM | POA: Diagnosis not present

## 2020-10-07 DIAGNOSIS — J439 Emphysema, unspecified: Secondary | ICD-10-CM | POA: Diagnosis not present

## 2020-10-07 DIAGNOSIS — I5020 Unspecified systolic (congestive) heart failure: Secondary | ICD-10-CM | POA: Diagnosis not present

## 2020-10-07 DIAGNOSIS — R109 Unspecified abdominal pain: Secondary | ICD-10-CM | POA: Diagnosis not present

## 2020-10-07 DIAGNOSIS — R918 Other nonspecific abnormal finding of lung field: Secondary | ICD-10-CM | POA: Diagnosis not present

## 2020-10-08 DIAGNOSIS — R0789 Other chest pain: Secondary | ICD-10-CM | POA: Diagnosis not present

## 2020-10-08 DIAGNOSIS — R000 Tachycardia, unspecified: Secondary | ICD-10-CM | POA: Diagnosis not present

## 2020-10-08 DIAGNOSIS — R918 Other nonspecific abnormal finding of lung field: Secondary | ICD-10-CM | POA: Diagnosis not present

## 2020-10-08 DIAGNOSIS — R1084 Generalized abdominal pain: Secondary | ICD-10-CM | POA: Diagnosis not present

## 2020-10-08 DIAGNOSIS — Z942 Lung transplant status: Secondary | ICD-10-CM | POA: Diagnosis not present

## 2020-10-08 DIAGNOSIS — I639 Cerebral infarction, unspecified: Secondary | ICD-10-CM | POA: Diagnosis not present

## 2020-10-08 DIAGNOSIS — I959 Hypotension, unspecified: Secondary | ICD-10-CM | POA: Diagnosis not present

## 2020-10-08 DIAGNOSIS — I5020 Unspecified systolic (congestive) heart failure: Secondary | ICD-10-CM | POA: Diagnosis not present

## 2020-10-08 DIAGNOSIS — J90 Pleural effusion, not elsewhere classified: Secondary | ICD-10-CM | POA: Diagnosis not present

## 2020-10-09 DIAGNOSIS — I959 Hypotension, unspecified: Secondary | ICD-10-CM | POA: Diagnosis not present

## 2020-10-09 DIAGNOSIS — R918 Other nonspecific abnormal finding of lung field: Secondary | ICD-10-CM | POA: Diagnosis not present

## 2020-10-09 DIAGNOSIS — Z942 Lung transplant status: Secondary | ICD-10-CM | POA: Diagnosis not present

## 2020-10-09 DIAGNOSIS — R000 Tachycardia, unspecified: Secondary | ICD-10-CM | POA: Diagnosis not present

## 2020-10-09 DIAGNOSIS — R1084 Generalized abdominal pain: Secondary | ICD-10-CM | POA: Diagnosis not present

## 2020-10-09 DIAGNOSIS — J90 Pleural effusion, not elsewhere classified: Secondary | ICD-10-CM | POA: Diagnosis not present

## 2020-10-09 DIAGNOSIS — R0789 Other chest pain: Secondary | ICD-10-CM | POA: Diagnosis not present

## 2020-10-09 DIAGNOSIS — I5020 Unspecified systolic (congestive) heart failure: Secondary | ICD-10-CM | POA: Diagnosis not present

## 2020-10-09 DIAGNOSIS — I639 Cerebral infarction, unspecified: Secondary | ICD-10-CM | POA: Diagnosis not present

## 2020-10-10 DIAGNOSIS — Z942 Lung transplant status: Secondary | ICD-10-CM | POA: Diagnosis not present

## 2020-10-10 DIAGNOSIS — R000 Tachycardia, unspecified: Secondary | ICD-10-CM | POA: Diagnosis not present

## 2020-10-10 DIAGNOSIS — I639 Cerebral infarction, unspecified: Secondary | ICD-10-CM | POA: Diagnosis not present

## 2020-10-10 DIAGNOSIS — R1084 Generalized abdominal pain: Secondary | ICD-10-CM | POA: Diagnosis not present

## 2020-10-10 DIAGNOSIS — R918 Other nonspecific abnormal finding of lung field: Secondary | ICD-10-CM | POA: Diagnosis not present

## 2020-10-10 DIAGNOSIS — I959 Hypotension, unspecified: Secondary | ICD-10-CM | POA: Diagnosis not present

## 2020-10-10 DIAGNOSIS — R0789 Other chest pain: Secondary | ICD-10-CM | POA: Diagnosis not present

## 2020-10-10 DIAGNOSIS — J90 Pleural effusion, not elsewhere classified: Secondary | ICD-10-CM | POA: Diagnosis not present

## 2020-10-10 DIAGNOSIS — I5020 Unspecified systolic (congestive) heart failure: Secondary | ICD-10-CM | POA: Diagnosis not present

## 2020-10-11 DIAGNOSIS — I959 Hypotension, unspecified: Secondary | ICD-10-CM | POA: Diagnosis not present

## 2020-10-11 DIAGNOSIS — R0789 Other chest pain: Secondary | ICD-10-CM | POA: Diagnosis not present

## 2020-10-11 DIAGNOSIS — R1084 Generalized abdominal pain: Secondary | ICD-10-CM | POA: Diagnosis not present

## 2020-10-11 DIAGNOSIS — Z942 Lung transplant status: Secondary | ICD-10-CM | POA: Diagnosis not present

## 2020-10-11 DIAGNOSIS — I639 Cerebral infarction, unspecified: Secondary | ICD-10-CM | POA: Diagnosis not present

## 2020-10-11 DIAGNOSIS — R000 Tachycardia, unspecified: Secondary | ICD-10-CM | POA: Diagnosis not present

## 2020-10-11 DIAGNOSIS — I5020 Unspecified systolic (congestive) heart failure: Secondary | ICD-10-CM | POA: Diagnosis not present

## 2020-10-11 DIAGNOSIS — J90 Pleural effusion, not elsewhere classified: Secondary | ICD-10-CM | POA: Diagnosis not present

## 2020-10-11 DIAGNOSIS — R918 Other nonspecific abnormal finding of lung field: Secondary | ICD-10-CM | POA: Diagnosis not present

## 2020-10-12 DIAGNOSIS — R1084 Generalized abdominal pain: Secondary | ICD-10-CM | POA: Diagnosis not present

## 2020-10-12 DIAGNOSIS — I959 Hypotension, unspecified: Secondary | ICD-10-CM | POA: Diagnosis not present

## 2020-10-12 DIAGNOSIS — I5020 Unspecified systolic (congestive) heart failure: Secondary | ICD-10-CM | POA: Diagnosis not present

## 2020-10-12 DIAGNOSIS — R000 Tachycardia, unspecified: Secondary | ICD-10-CM | POA: Diagnosis not present

## 2020-10-12 DIAGNOSIS — R0789 Other chest pain: Secondary | ICD-10-CM | POA: Diagnosis not present

## 2020-10-12 DIAGNOSIS — I639 Cerebral infarction, unspecified: Secondary | ICD-10-CM | POA: Diagnosis not present

## 2020-10-12 DIAGNOSIS — J90 Pleural effusion, not elsewhere classified: Secondary | ICD-10-CM | POA: Diagnosis not present

## 2020-10-12 DIAGNOSIS — Z942 Lung transplant status: Secondary | ICD-10-CM | POA: Diagnosis not present

## 2020-10-12 DIAGNOSIS — R918 Other nonspecific abnormal finding of lung field: Secondary | ICD-10-CM | POA: Diagnosis not present

## 2020-10-13 DIAGNOSIS — I5020 Unspecified systolic (congestive) heart failure: Secondary | ICD-10-CM | POA: Diagnosis not present

## 2020-10-13 DIAGNOSIS — J90 Pleural effusion, not elsewhere classified: Secondary | ICD-10-CM | POA: Diagnosis not present

## 2020-10-13 DIAGNOSIS — R000 Tachycardia, unspecified: Secondary | ICD-10-CM | POA: Diagnosis not present

## 2020-10-13 DIAGNOSIS — R1084 Generalized abdominal pain: Secondary | ICD-10-CM | POA: Diagnosis not present

## 2020-10-13 DIAGNOSIS — R918 Other nonspecific abnormal finding of lung field: Secondary | ICD-10-CM | POA: Diagnosis not present

## 2020-10-13 DIAGNOSIS — R0789 Other chest pain: Secondary | ICD-10-CM | POA: Diagnosis not present

## 2020-10-13 DIAGNOSIS — Z942 Lung transplant status: Secondary | ICD-10-CM | POA: Diagnosis not present

## 2020-10-13 DIAGNOSIS — I639 Cerebral infarction, unspecified: Secondary | ICD-10-CM | POA: Diagnosis not present

## 2020-10-13 DIAGNOSIS — I959 Hypotension, unspecified: Secondary | ICD-10-CM | POA: Diagnosis not present

## 2020-10-14 DIAGNOSIS — R0789 Other chest pain: Secondary | ICD-10-CM | POA: Diagnosis not present

## 2020-10-14 DIAGNOSIS — R000 Tachycardia, unspecified: Secondary | ICD-10-CM | POA: Diagnosis not present

## 2020-10-14 DIAGNOSIS — R918 Other nonspecific abnormal finding of lung field: Secondary | ICD-10-CM | POA: Diagnosis not present

## 2020-10-14 DIAGNOSIS — I959 Hypotension, unspecified: Secondary | ICD-10-CM | POA: Diagnosis not present

## 2020-10-14 DIAGNOSIS — Z942 Lung transplant status: Secondary | ICD-10-CM | POA: Diagnosis not present

## 2020-10-14 DIAGNOSIS — J90 Pleural effusion, not elsewhere classified: Secondary | ICD-10-CM | POA: Diagnosis not present

## 2020-10-14 DIAGNOSIS — I5020 Unspecified systolic (congestive) heart failure: Secondary | ICD-10-CM | POA: Diagnosis not present

## 2020-10-14 DIAGNOSIS — R1084 Generalized abdominal pain: Secondary | ICD-10-CM | POA: Diagnosis not present

## 2020-10-14 DIAGNOSIS — I639 Cerebral infarction, unspecified: Secondary | ICD-10-CM | POA: Diagnosis not present

## 2020-10-15 DIAGNOSIS — A319 Mycobacterial infection, unspecified: Secondary | ICD-10-CM | POA: Diagnosis not present

## 2020-10-15 DIAGNOSIS — I5020 Unspecified systolic (congestive) heart failure: Secondary | ICD-10-CM | POA: Diagnosis not present

## 2020-10-16 DIAGNOSIS — A319 Mycobacterial infection, unspecified: Secondary | ICD-10-CM | POA: Diagnosis not present

## 2020-10-16 DIAGNOSIS — I5020 Unspecified systolic (congestive) heart failure: Secondary | ICD-10-CM | POA: Diagnosis not present

## 2020-10-17 DIAGNOSIS — I5020 Unspecified systolic (congestive) heart failure: Secondary | ICD-10-CM | POA: Diagnosis not present

## 2020-10-17 DIAGNOSIS — A319 Mycobacterial infection, unspecified: Secondary | ICD-10-CM | POA: Diagnosis not present

## 2020-10-18 DIAGNOSIS — I6930 Unspecified sequelae of cerebral infarction: Secondary | ICD-10-CM | POA: Insufficient documentation

## 2020-10-18 DIAGNOSIS — A319 Mycobacterial infection, unspecified: Secondary | ICD-10-CM | POA: Insufficient documentation

## 2020-10-18 DIAGNOSIS — J984 Other disorders of lung: Secondary | ICD-10-CM | POA: Insufficient documentation

## 2020-10-18 DIAGNOSIS — R590 Localized enlarged lymph nodes: Secondary | ICD-10-CM | POA: Insufficient documentation

## 2020-10-18 DIAGNOSIS — J449 Chronic obstructive pulmonary disease, unspecified: Secondary | ICD-10-CM | POA: Insufficient documentation

## 2020-10-18 DIAGNOSIS — A310 Pulmonary mycobacterial infection: Secondary | ICD-10-CM | POA: Insufficient documentation

## 2020-10-18 DIAGNOSIS — I5020 Unspecified systolic (congestive) heart failure: Secondary | ICD-10-CM | POA: Insufficient documentation

## 2020-10-23 DIAGNOSIS — I69322 Dysarthria following cerebral infarction: Secondary | ICD-10-CM | POA: Diagnosis not present

## 2020-10-23 DIAGNOSIS — I70201 Unspecified atherosclerosis of native arteries of extremities, right leg: Secondary | ICD-10-CM | POA: Diagnosis not present

## 2020-10-23 DIAGNOSIS — I69392 Facial weakness following cerebral infarction: Secondary | ICD-10-CM | POA: Diagnosis not present

## 2020-10-23 DIAGNOSIS — E440 Moderate protein-calorie malnutrition: Secondary | ICD-10-CM | POA: Diagnosis not present

## 2020-10-23 DIAGNOSIS — F419 Anxiety disorder, unspecified: Secondary | ICD-10-CM | POA: Diagnosis not present

## 2020-10-23 DIAGNOSIS — J439 Emphysema, unspecified: Secondary | ICD-10-CM | POA: Diagnosis not present

## 2020-10-23 DIAGNOSIS — R911 Solitary pulmonary nodule: Secondary | ICD-10-CM | POA: Diagnosis not present

## 2020-10-23 DIAGNOSIS — I5022 Chronic systolic (congestive) heart failure: Secondary | ICD-10-CM | POA: Diagnosis not present

## 2020-10-23 DIAGNOSIS — I4891 Unspecified atrial fibrillation: Secondary | ICD-10-CM | POA: Diagnosis not present

## 2020-10-25 DIAGNOSIS — E440 Moderate protein-calorie malnutrition: Secondary | ICD-10-CM | POA: Diagnosis not present

## 2020-10-25 DIAGNOSIS — I69322 Dysarthria following cerebral infarction: Secondary | ICD-10-CM | POA: Diagnosis not present

## 2020-10-25 DIAGNOSIS — R911 Solitary pulmonary nodule: Secondary | ICD-10-CM | POA: Diagnosis not present

## 2020-10-25 DIAGNOSIS — I70201 Unspecified atherosclerosis of native arteries of extremities, right leg: Secondary | ICD-10-CM | POA: Diagnosis not present

## 2020-10-25 DIAGNOSIS — J439 Emphysema, unspecified: Secondary | ICD-10-CM | POA: Diagnosis not present

## 2020-10-25 DIAGNOSIS — I69392 Facial weakness following cerebral infarction: Secondary | ICD-10-CM | POA: Diagnosis not present

## 2020-10-25 DIAGNOSIS — I4891 Unspecified atrial fibrillation: Secondary | ICD-10-CM | POA: Diagnosis not present

## 2020-10-25 DIAGNOSIS — I5022 Chronic systolic (congestive) heart failure: Secondary | ICD-10-CM | POA: Diagnosis not present

## 2020-10-25 DIAGNOSIS — F419 Anxiety disorder, unspecified: Secondary | ICD-10-CM | POA: Diagnosis not present

## 2020-10-26 DIAGNOSIS — H2510 Age-related nuclear cataract, unspecified eye: Secondary | ICD-10-CM | POA: Diagnosis not present

## 2020-10-26 DIAGNOSIS — I5022 Chronic systolic (congestive) heart failure: Secondary | ICD-10-CM | POA: Diagnosis not present

## 2020-10-26 DIAGNOSIS — I69322 Dysarthria following cerebral infarction: Secondary | ICD-10-CM | POA: Diagnosis not present

## 2020-10-26 DIAGNOSIS — I70201 Unspecified atherosclerosis of native arteries of extremities, right leg: Secondary | ICD-10-CM | POA: Diagnosis not present

## 2020-10-26 DIAGNOSIS — J439 Emphysema, unspecified: Secondary | ICD-10-CM | POA: Diagnosis not present

## 2020-10-26 DIAGNOSIS — E440 Moderate protein-calorie malnutrition: Secondary | ICD-10-CM | POA: Diagnosis not present

## 2020-10-26 DIAGNOSIS — R911 Solitary pulmonary nodule: Secondary | ICD-10-CM | POA: Diagnosis not present

## 2020-10-26 DIAGNOSIS — H5203 Hypermetropia, bilateral: Secondary | ICD-10-CM | POA: Diagnosis not present

## 2020-10-26 DIAGNOSIS — I69392 Facial weakness following cerebral infarction: Secondary | ICD-10-CM | POA: Diagnosis not present

## 2020-10-26 DIAGNOSIS — F419 Anxiety disorder, unspecified: Secondary | ICD-10-CM | POA: Diagnosis not present

## 2020-10-26 DIAGNOSIS — Z0100 Encounter for examination of eyes and vision without abnormal findings: Secondary | ICD-10-CM | POA: Diagnosis not present

## 2020-10-26 DIAGNOSIS — H52223 Regular astigmatism, bilateral: Secondary | ICD-10-CM | POA: Diagnosis not present

## 2020-10-26 DIAGNOSIS — H524 Presbyopia: Secondary | ICD-10-CM | POA: Diagnosis not present

## 2020-10-26 DIAGNOSIS — I4891 Unspecified atrial fibrillation: Secondary | ICD-10-CM | POA: Diagnosis not present

## 2020-10-31 DIAGNOSIS — I70201 Unspecified atherosclerosis of native arteries of extremities, right leg: Secondary | ICD-10-CM | POA: Diagnosis not present

## 2020-10-31 DIAGNOSIS — R0602 Shortness of breath: Secondary | ICD-10-CM | POA: Diagnosis not present

## 2020-10-31 DIAGNOSIS — R911 Solitary pulmonary nodule: Secondary | ICD-10-CM | POA: Diagnosis not present

## 2020-10-31 DIAGNOSIS — E440 Moderate protein-calorie malnutrition: Secondary | ICD-10-CM | POA: Diagnosis not present

## 2020-10-31 DIAGNOSIS — I4891 Unspecified atrial fibrillation: Secondary | ICD-10-CM | POA: Diagnosis not present

## 2020-10-31 DIAGNOSIS — I739 Peripheral vascular disease, unspecified: Secondary | ICD-10-CM | POA: Diagnosis not present

## 2020-10-31 DIAGNOSIS — R768 Other specified abnormal immunological findings in serum: Secondary | ICD-10-CM | POA: Diagnosis not present

## 2020-10-31 DIAGNOSIS — I77810 Thoracic aortic ectasia: Secondary | ICD-10-CM | POA: Diagnosis not present

## 2020-10-31 DIAGNOSIS — J449 Chronic obstructive pulmonary disease, unspecified: Secondary | ICD-10-CM | POA: Diagnosis not present

## 2020-10-31 DIAGNOSIS — A159 Respiratory tuberculosis unspecified: Secondary | ICD-10-CM | POA: Diagnosis not present

## 2020-10-31 DIAGNOSIS — I69322 Dysarthria following cerebral infarction: Secondary | ICD-10-CM | POA: Diagnosis not present

## 2020-10-31 DIAGNOSIS — J431 Panlobular emphysema: Secondary | ICD-10-CM | POA: Diagnosis not present

## 2020-10-31 DIAGNOSIS — R918 Other nonspecific abnormal finding of lung field: Secondary | ICD-10-CM | POA: Diagnosis not present

## 2020-10-31 DIAGNOSIS — J439 Emphysema, unspecified: Secondary | ICD-10-CM | POA: Diagnosis not present

## 2020-10-31 DIAGNOSIS — I5020 Unspecified systolic (congestive) heart failure: Secondary | ICD-10-CM | POA: Diagnosis not present

## 2020-10-31 DIAGNOSIS — F419 Anxiety disorder, unspecified: Secondary | ICD-10-CM | POA: Diagnosis not present

## 2020-10-31 DIAGNOSIS — I69392 Facial weakness following cerebral infarction: Secondary | ICD-10-CM | POA: Diagnosis not present

## 2020-10-31 DIAGNOSIS — I5022 Chronic systolic (congestive) heart failure: Secondary | ICD-10-CM | POA: Diagnosis not present

## 2020-11-01 ENCOUNTER — Inpatient Hospital Stay: Payer: Medicare HMO | Admitting: Unknown Physician Specialty

## 2020-11-02 DIAGNOSIS — I69392 Facial weakness following cerebral infarction: Secondary | ICD-10-CM | POA: Diagnosis not present

## 2020-11-02 DIAGNOSIS — I5022 Chronic systolic (congestive) heart failure: Secondary | ICD-10-CM | POA: Diagnosis not present

## 2020-11-02 DIAGNOSIS — J439 Emphysema, unspecified: Secondary | ICD-10-CM | POA: Diagnosis not present

## 2020-11-02 DIAGNOSIS — I4891 Unspecified atrial fibrillation: Secondary | ICD-10-CM | POA: Diagnosis not present

## 2020-11-02 DIAGNOSIS — I70201 Unspecified atherosclerosis of native arteries of extremities, right leg: Secondary | ICD-10-CM | POA: Diagnosis not present

## 2020-11-02 DIAGNOSIS — I69322 Dysarthria following cerebral infarction: Secondary | ICD-10-CM | POA: Diagnosis not present

## 2020-11-02 DIAGNOSIS — E440 Moderate protein-calorie malnutrition: Secondary | ICD-10-CM | POA: Diagnosis not present

## 2020-11-02 DIAGNOSIS — F419 Anxiety disorder, unspecified: Secondary | ICD-10-CM | POA: Diagnosis not present

## 2020-11-02 DIAGNOSIS — R911 Solitary pulmonary nodule: Secondary | ICD-10-CM | POA: Diagnosis not present

## 2020-11-03 DIAGNOSIS — I4891 Unspecified atrial fibrillation: Secondary | ICD-10-CM | POA: Diagnosis not present

## 2020-11-03 DIAGNOSIS — J439 Emphysema, unspecified: Secondary | ICD-10-CM | POA: Diagnosis not present

## 2020-11-03 DIAGNOSIS — I5022 Chronic systolic (congestive) heart failure: Secondary | ICD-10-CM | POA: Diagnosis not present

## 2020-11-03 DIAGNOSIS — I69322 Dysarthria following cerebral infarction: Secondary | ICD-10-CM | POA: Diagnosis not present

## 2020-11-03 DIAGNOSIS — I70201 Unspecified atherosclerosis of native arteries of extremities, right leg: Secondary | ICD-10-CM | POA: Diagnosis not present

## 2020-11-03 DIAGNOSIS — R911 Solitary pulmonary nodule: Secondary | ICD-10-CM | POA: Diagnosis not present

## 2020-11-03 DIAGNOSIS — F419 Anxiety disorder, unspecified: Secondary | ICD-10-CM | POA: Diagnosis not present

## 2020-11-03 DIAGNOSIS — E440 Moderate protein-calorie malnutrition: Secondary | ICD-10-CM | POA: Diagnosis not present

## 2020-11-03 DIAGNOSIS — I69392 Facial weakness following cerebral infarction: Secondary | ICD-10-CM | POA: Diagnosis not present

## 2020-11-07 DIAGNOSIS — I70209 Unspecified atherosclerosis of native arteries of extremities, unspecified extremity: Secondary | ICD-10-CM | POA: Diagnosis not present

## 2020-11-07 DIAGNOSIS — I70222 Atherosclerosis of native arteries of extremities with rest pain, left leg: Secondary | ICD-10-CM | POA: Diagnosis not present

## 2020-11-07 DIAGNOSIS — I5020 Unspecified systolic (congestive) heart failure: Secondary | ICD-10-CM | POA: Diagnosis not present

## 2020-11-07 DIAGNOSIS — M79672 Pain in left foot: Secondary | ICD-10-CM | POA: Diagnosis not present

## 2020-11-07 DIAGNOSIS — Z87891 Personal history of nicotine dependence: Secondary | ICD-10-CM | POA: Diagnosis not present

## 2020-11-07 DIAGNOSIS — I739 Peripheral vascular disease, unspecified: Secondary | ICD-10-CM | POA: Diagnosis not present

## 2020-11-07 DIAGNOSIS — G8929 Other chronic pain: Secondary | ICD-10-CM | POA: Diagnosis not present

## 2020-11-08 DIAGNOSIS — R911 Solitary pulmonary nodule: Secondary | ICD-10-CM | POA: Diagnosis not present

## 2020-11-08 DIAGNOSIS — I69392 Facial weakness following cerebral infarction: Secondary | ICD-10-CM | POA: Diagnosis not present

## 2020-11-08 DIAGNOSIS — E440 Moderate protein-calorie malnutrition: Secondary | ICD-10-CM | POA: Diagnosis not present

## 2020-11-08 DIAGNOSIS — J439 Emphysema, unspecified: Secondary | ICD-10-CM | POA: Diagnosis not present

## 2020-11-08 DIAGNOSIS — F419 Anxiety disorder, unspecified: Secondary | ICD-10-CM | POA: Diagnosis not present

## 2020-11-08 DIAGNOSIS — I5022 Chronic systolic (congestive) heart failure: Secondary | ICD-10-CM | POA: Diagnosis not present

## 2020-11-08 DIAGNOSIS — I4891 Unspecified atrial fibrillation: Secondary | ICD-10-CM | POA: Diagnosis not present

## 2020-11-08 DIAGNOSIS — I70201 Unspecified atherosclerosis of native arteries of extremities, right leg: Secondary | ICD-10-CM | POA: Diagnosis not present

## 2020-11-08 DIAGNOSIS — I69322 Dysarthria following cerebral infarction: Secondary | ICD-10-CM | POA: Diagnosis not present

## 2020-11-10 ENCOUNTER — Ambulatory Visit (INDEPENDENT_AMBULATORY_CARE_PROVIDER_SITE_OTHER): Payer: Medicare HMO | Admitting: Unknown Physician Specialty

## 2020-11-10 ENCOUNTER — Encounter: Payer: Self-pay | Admitting: Unknown Physician Specialty

## 2020-11-10 ENCOUNTER — Other Ambulatory Visit: Payer: Self-pay

## 2020-11-10 VITALS — BP 88/70 | HR 95 | Temp 97.7°F | Ht 74.0 in | Wt 136.0 lb

## 2020-11-10 DIAGNOSIS — Z5181 Encounter for therapeutic drug level monitoring: Secondary | ICD-10-CM | POA: Diagnosis not present

## 2020-11-10 DIAGNOSIS — I5020 Unspecified systolic (congestive) heart failure: Secondary | ICD-10-CM

## 2020-11-10 DIAGNOSIS — I482 Chronic atrial fibrillation, unspecified: Secondary | ICD-10-CM | POA: Diagnosis not present

## 2020-11-10 DIAGNOSIS — F17200 Nicotine dependence, unspecified, uncomplicated: Secondary | ICD-10-CM | POA: Diagnosis not present

## 2020-11-10 DIAGNOSIS — I5022 Chronic systolic (congestive) heart failure: Secondary | ICD-10-CM

## 2020-11-10 DIAGNOSIS — J449 Chronic obstructive pulmonary disease, unspecified: Secondary | ICD-10-CM | POA: Diagnosis not present

## 2020-11-10 DIAGNOSIS — I70211 Atherosclerosis of native arteries of extremities with intermittent claudication, right leg: Secondary | ICD-10-CM

## 2020-11-10 DIAGNOSIS — A319 Mycobacterial infection, unspecified: Secondary | ICD-10-CM

## 2020-11-10 MED ORDER — OMEPRAZOLE 20 MG PO CPDR: 20.0000 mg | DELAYED_RELEASE_CAPSULE | Freq: Every day | ORAL | 1 refills | Status: DC

## 2020-11-10 MED ORDER — ATORVASTATIN CALCIUM 80 MG PO TABS: 80.0000 mg | ORAL_TABLET | Freq: Every day | ORAL | 3 refills | Status: DC

## 2020-11-10 MED ORDER — DIGOXIN 250 MCG PO TABS: 0.2500 mg | ORAL_TABLET | Freq: Every day | ORAL | 1 refills | Status: DC

## 2020-11-10 MED ORDER — APIXABAN 5 MG PO TABS: 5.0000 mg | ORAL_TABLET | Freq: Two times a day (BID) | ORAL | 1 refills | Status: DC

## 2020-11-10 MED ORDER — GABAPENTIN 400 MG PO CAPS: 400.0000 mg | ORAL_CAPSULE | Freq: Three times a day (TID) | ORAL | 1 refills | Status: DC

## 2020-11-10 MED ORDER — METOPROLOL SUCCINATE ER 25 MG PO TB24: 50.0000 mg | ORAL_TABLET | Freq: Every day | ORAL | 3 refills | Status: DC

## 2020-11-10 MED ORDER — DULOXETINE HCL 60 MG PO CPEP: 60.0000 mg | ORAL_CAPSULE | Freq: Every day | ORAL | 1 refills | Status: DC

## 2020-11-10 MED ORDER — SPIRONOLACTONE 25 MG PO TABS
12.5000 mg | ORAL_TABLET | Freq: Every day | ORAL | 1 refills | Status: DC
Start: 2020-11-10 — End: 2020-12-09

## 2020-11-10 MED ORDER — EMPAGLIFLOZIN 10 MG PO TABS: 10.0000 mg | ORAL_TABLET | Freq: Every day | ORAL | 1 refills | Status: DC

## 2020-11-10 NOTE — Assessment & Plan Note (Signed)
Rate 95 today.  Will increase Metoprolol to 50 mg.

## 2020-11-10 NOTE — Progress Notes (Addendum)
BP (!) 88/70   Pulse 95   Temp 97.7 F (36.5 C) (Temporal)   Ht 6\' 2"  (1.88 m)   Wt 136 lb (61.7 kg)   SpO2 97%   BMI 17.46 kg/m    Subjective:    Patient ID: Ricardo Folks Sr., male    DOB: 04-19-1956, 65 y.o.   MRN: [id number]  HPI: Ricardo WINKER Sr. is a 65 y.o. male  Chief Complaint  Patient presents with  . Hospitalization Follow-up    Pt was seen at the ER had heart failure and he seems to be improving. Pt would like to make sure medications get refilled.    Pt being seen with son who is an ICU nurse and gives much of the history.    Admitted 1/15 to [place].  However this is following 3 weeks of hopitalization in [place]: Was in [place] for 3 years due to [place] from [place].  Got sick in [place] due to microbacterium  (awaiting speciation to treat), On flight [place] to [place], needed to deplane due to stroke and PE.  Stayed there for 3 weeks with a chest tube and eventually transferred to [facility] for 3 weeks. Currently HF with reduced EF of 20-25%   Pt is seen at cardiology, pulmonary and vascular at [facility].  Next cardiology appt is May.  Vascular following and planning on femoral bypass.    Note review shows the following: Anticipatory Guidance for Outpatient Provider:  -- Recommend up-titration of guideline-directed therapies for heart failure (BB, ARB, MRA, SGLT2i) as blood pressures allow -- Recommend titration of inhaler regimen to therapeutic effect -- Continue to monitor abstinence from smoking  Family History  Problem Relation Age of Onset  . Cancer Mother        liver  . Heart disease Father   . Diabetes Father   . Breast cancer Sister   . Brain cancer Sister    Social History   Socioeconomic History  . Marital status: Single    Spouse name: Not on file  . Number of children: Not on file  . Years of education: Not on file  . Highest education level: Not on file  Occupational History  . Not on file  Tobacco Use  . Smoking status: Current Every Day  Smoker    Packs/day: 0.50    Years: 40.00    Pack years: 20.00    Types: Cigarettes  . Smokeless tobacco: Never Used  Vaping Use  . Vaping Use: Former  Substance and Sexual Activity  . Alcohol use: Yes    Alcohol/week: 2.0 standard drinks    Types: 2 Shots of liquor per week    Comment: occasional (every 3-4 weeks)   . Drug use: No    Comment: polysubstance approx 2 years ago  . Sexual activity: Not Currently  Other Topics Concern  . Not on file  Social History Narrative  . Not on file   Social Determinants of Health   Financial Resource Strain: Not on file  Food Insecurity: Not on file  Transportation Needs: Not on file  Physical Activity: Not on file  Stress: Not on file  Social Connections: Not on file  Intimate Partner Violence: Not on file     Relevant past medical, surgical, family and social history reviewed and updated as indicated. Interim medical history since our last visit reviewed. Allergies and medications reviewed and updated.  Review of Systems  Constitutional: Negative.   HENT: Negative.   Cardiovascular: Negative  for leg swelling.  Endocrine: Negative for polydipsia, polyphagia and polyuria.  Genitourinary: Negative.   Skin: Positive for pallor.  Psychiatric/Behavioral: Negative.     Per HPI unless specifically indicated above     Objective:    BP (!) 88/70   Pulse 95   Temp 97.7 F (36.5 C) (Temporal)   Ht 6\' 2"  (1.88 m)   Wt 136 lb (61.7 kg)   SpO2 97%   BMI 17.46 kg/m   Wt Readings from Last 3 Encounters:  11/10/20 136 lb (61.7 kg)  12/18/17 169 lb 6.4 oz (76.8 kg)  10/17/17 183 lb 9.6 oz (83.3 kg)    Physical Exam Constitutional:      General: He is not in acute distress.    Appearance: He is well-developed. He is ill-appearing.  HENT:     Head: Normocephalic and atraumatic.  Eyes:     General: Lids are normal. No scleral icterus.       Right eye: No discharge.        Left eye: No discharge.     Conjunctiva/sclera:  Conjunctivae normal.  Neck:     Vascular: No carotid bruit or JVD.  Cardiovascular:     Rate and Rhythm: Normal rate and regular rhythm.     Heart sounds: Normal heart sounds.  Pulmonary:     Effort: Pulmonary effort is normal. No respiratory distress.     Breath sounds: Normal breath sounds. No stridor. No wheezing or rhonchi.  Abdominal:     Palpations: There is no hepatomegaly or splenomegaly.  Musculoskeletal:        General: Normal range of motion.     Cervical back: Normal range of motion and neck supple.  Skin:    General: Skin is warm and dry.     Coloration: Skin is not pale.     Findings: No rash.  Neurological:     Mental Status: He is alert and oriented to person, place, and time.  Psychiatric:        Behavior: Behavior normal.        Thought Content: Thought content normal.        Judgment: Judgment normal.      Assessment & Plan:   Problem List Items Addressed This Visit      Unprioritized   Atherosclerosis of native arteries of extremity with intermittent claudication ([place])    Seeing vascular.  Fem pop is planned.        Relevant Medications   spironolactone (ALDACTONE) 25 MG tablet   DULoxetine (CYMBALTA) 60 MG capsule   gabapentin (NEURONTIN) 400 MG capsule   metoprolol succinate (TOPROL-XL) 25 MG 24 hr tablet   atorvastatin (LIPITOR) 80 MG tablet   digoxin (LANOXIN) 0.25 MG tablet   apixaban (ELIQUIS) 5 MG TABS tablet   Atrial fibrillation (HCC)    Rate 95 today.  Will increase Metoprolol to 50 mg.        Relevant Medications   spironolactone (ALDACTONE) 25 MG tablet   metoprolol succinate (TOPROL-XL) 25 MG 24 hr tablet   atorvastatin (LIPITOR) 80 MG tablet   digoxin (LANOXIN) 0.25 MG tablet   apixaban (ELIQUIS) 5 MG TABS tablet   COPD (chronic obstructive pulmonary disease) ([place])    Finishing Spireva.  Will start on Trelogy soon.  PFTs monitored at pulmonary clinic      Relevant Medications   Fluticasone-Umeclidin-Vilant (TRELEGY ELLIPTA)  100-62.5-25 MCG/INH AEPB   HFrEF (heart failure with reduced ejection fraction) (HCC)    Increase Spironalactone  from 12.5 mg to 50 mg as tolerated.        Relevant Medications   spironolactone (ALDACTONE) 25 MG tablet   metoprolol succinate (TOPROL-XL) 25 MG 24 hr tablet   atorvastatin (LIPITOR) 80 MG tablet   digoxin (LANOXIN) 0.25 MG tablet   apixaban (ELIQUIS) 5 MG TABS tablet   Mycobacterial infection, non-TB    Per pulmonary      RESOLVED: Smoker    Was able to quit       Other Visit Diagnoses    Chronic systolic heart failure (HCC)    -  Primary   Relevant Medications   spironolactone (ALDACTONE) 25 MG tablet   metoprolol succinate (TOPROL-XL) 25 MG 24 hr tablet   atorvastatin (LIPITOR) 80 MG tablet   digoxin (LANOXIN) 0.25 MG tablet   apixaban (ELIQUIS) 5 MG TABS tablet   Other Relevant Orders   Comprehensive metabolic panel   CBC   Medication monitoring encounter       Relevant Medications   apixaban (ELIQUIS) 5 MG TABS tablet   Other Relevant Orders   Digoxin level   CBC with Differential/Platelet   Comprehensive metabolic panel   Digoxin level   CBC with Differential/Platelet   Comprehensive metabolic panel   Digoxin level      Recommended Follow-up Studies (per chart review):  -- CBC, BMP at next outpatient visit (written, will get at lab closer to home) -- PFTs when feasible (done per pulmonary)  -- Further chest imaging at discretion of pulmonary clinic -- Age-appropriate cancer screening including colonoscopy - refusing.     Follow up plan: Return in about 6 months (around 05/13/2021).

## 2020-11-10 NOTE — Assessment & Plan Note (Signed)
Increase Spironalactone from 12.5 mg to 50 mg as tolerated.

## 2020-11-10 NOTE — Assessment & Plan Note (Signed)
Finishing Spireva.  Will start on Trelogy soon.  PFTs monitored at pulmonary clinic

## 2020-11-10 NOTE — Assessment & Plan Note (Signed)
Per pulmonary 

## 2020-11-10 NOTE — Assessment & Plan Note (Signed)
Was able to quit

## 2020-11-10 NOTE — Assessment & Plan Note (Signed)
Seeing vascular.  Fem pop is planned.

## 2020-11-10 NOTE — Patient Instructions (Signed)
Increase Metoprolol from 25-50 mg Increase Spironalactone to 25 mg

## 2020-11-11 DIAGNOSIS — I70201 Unspecified atherosclerosis of native arteries of extremities, right leg: Secondary | ICD-10-CM | POA: Diagnosis not present

## 2020-11-11 DIAGNOSIS — R911 Solitary pulmonary nodule: Secondary | ICD-10-CM | POA: Diagnosis not present

## 2020-11-11 DIAGNOSIS — I69322 Dysarthria following cerebral infarction: Secondary | ICD-10-CM | POA: Diagnosis not present

## 2020-11-11 DIAGNOSIS — I5022 Chronic systolic (congestive) heart failure: Secondary | ICD-10-CM | POA: Diagnosis not present

## 2020-11-11 DIAGNOSIS — I4891 Unspecified atrial fibrillation: Secondary | ICD-10-CM | POA: Diagnosis not present

## 2020-11-11 DIAGNOSIS — E440 Moderate protein-calorie malnutrition: Secondary | ICD-10-CM | POA: Diagnosis not present

## 2020-11-11 DIAGNOSIS — J439 Emphysema, unspecified: Secondary | ICD-10-CM | POA: Diagnosis not present

## 2020-11-11 DIAGNOSIS — F419 Anxiety disorder, unspecified: Secondary | ICD-10-CM | POA: Diagnosis not present

## 2020-11-11 DIAGNOSIS — I69392 Facial weakness following cerebral infarction: Secondary | ICD-10-CM | POA: Diagnosis not present

## 2020-11-14 DIAGNOSIS — Z5181 Encounter for therapeutic drug level monitoring: Secondary | ICD-10-CM | POA: Diagnosis not present

## 2020-11-15 DIAGNOSIS — I70201 Unspecified atherosclerosis of native arteries of extremities, right leg: Secondary | ICD-10-CM | POA: Diagnosis not present

## 2020-11-15 DIAGNOSIS — I69322 Dysarthria following cerebral infarction: Secondary | ICD-10-CM | POA: Diagnosis not present

## 2020-11-15 DIAGNOSIS — I5022 Chronic systolic (congestive) heart failure: Secondary | ICD-10-CM | POA: Diagnosis not present

## 2020-11-15 DIAGNOSIS — I69392 Facial weakness following cerebral infarction: Secondary | ICD-10-CM | POA: Diagnosis not present

## 2020-11-15 DIAGNOSIS — F419 Anxiety disorder, unspecified: Secondary | ICD-10-CM | POA: Diagnosis not present

## 2020-11-15 DIAGNOSIS — R911 Solitary pulmonary nodule: Secondary | ICD-10-CM | POA: Diagnosis not present

## 2020-11-15 DIAGNOSIS — J439 Emphysema, unspecified: Secondary | ICD-10-CM | POA: Diagnosis not present

## 2020-11-15 DIAGNOSIS — E440 Moderate protein-calorie malnutrition: Secondary | ICD-10-CM | POA: Diagnosis not present

## 2020-11-15 DIAGNOSIS — I4891 Unspecified atrial fibrillation: Secondary | ICD-10-CM | POA: Diagnosis not present

## 2020-11-15 LAB — COMPREHENSIVE METABOLIC PANEL
ALT: 39 IU/L (ref 0–44)
AST: 29 IU/L (ref 0–40)
Albumin/Globulin Ratio: 1.5 (ref 1.2–2.2)
Albumin: 4.1 g/dL (ref 3.8–4.8)
Alkaline Phosphatase: 138 IU/L — ABNORMAL HIGH (ref 44–121)
BUN/Creatinine Ratio: 22 (ref 10–24)
BUN: 15 mg/dL (ref 8–27)
Bilirubin Total: 1.1 mg/dL (ref 0.0–1.2)
CO2: 21 mmol/L (ref 20–29)
Calcium: 10 mg/dL (ref 8.6–10.2)
Chloride: 98 mmol/L (ref 96–106)
Creatinine, Ser: 0.67 mg/dL — ABNORMAL LOW (ref 0.76–1.27)
Globulin, Total: 2.8 g/dL (ref 1.5–4.5)
Glucose: 102 mg/dL — ABNORMAL HIGH (ref 65–99)
Potassium: 4.3 mmol/L (ref 3.5–5.2)
Sodium: 137 mmol/L (ref 134–144)
Total Protein: 6.9 g/dL (ref 6.0–8.5)
eGFR: 105 mL/min/{1.73_m2} (ref >59)

## 2020-11-15 LAB — CBC WITH DIFFERENTIAL/PLATELET
Basophils Absolute: 0 10*3/uL (ref 0.0–0.2)
Basos: 1 %
EOS (ABSOLUTE): 0.2 10*3/uL (ref 0.0–0.4)
Eos: 3 %
Hematocrit: 40 % (ref 37.5–51.0)
Hemoglobin: 12.9 g/dL — ABNORMAL LOW (ref 13.0–17.7)
Immature Grans (Abs): 0 10*3/uL (ref 0.0–0.1)
Immature Granulocytes: 1 %
Lymphocytes Absolute: 0.9 10*3/uL (ref 0.7–3.1)
Lymphs: 12 %
MCH: 30.7 pg (ref 26.6–33.0)
MCHC: 32.3 g/dL (ref 31.5–35.7)
MCV: 95 fL (ref 79–97)
Monocytes Absolute: 0.6 10*3/uL (ref 0.1–0.9)
Monocytes: 8 %
Neutrophils Absolute: 5.3 10*3/uL (ref 1.4–7.0)
Neutrophils: 75 %
Platelets: 287 10*3/uL (ref 150–450)
RBC: 4.2 x10E6/uL (ref 4.14–5.80)
RDW: 14.4 % (ref 11.6–15.4)
WBC: 7 10*3/uL (ref 3.4–10.8)

## 2020-11-15 LAB — DIGOXIN LEVEL: Digoxin, Serum: 1.1 ng/mL — ABNORMAL HIGH (ref 0.5–0.9)

## 2020-11-18 DIAGNOSIS — I5022 Chronic systolic (congestive) heart failure: Secondary | ICD-10-CM | POA: Diagnosis not present

## 2020-11-18 DIAGNOSIS — F419 Anxiety disorder, unspecified: Secondary | ICD-10-CM | POA: Diagnosis not present

## 2020-11-18 DIAGNOSIS — J439 Emphysema, unspecified: Secondary | ICD-10-CM | POA: Diagnosis not present

## 2020-11-18 DIAGNOSIS — I6930 Unspecified sequelae of cerebral infarction: Secondary | ICD-10-CM | POA: Diagnosis not present

## 2020-11-18 DIAGNOSIS — I69322 Dysarthria following cerebral infarction: Secondary | ICD-10-CM | POA: Diagnosis not present

## 2020-11-18 DIAGNOSIS — I4891 Unspecified atrial fibrillation: Secondary | ICD-10-CM | POA: Diagnosis not present

## 2020-11-18 DIAGNOSIS — E440 Moderate protein-calorie malnutrition: Secondary | ICD-10-CM | POA: Diagnosis not present

## 2020-11-18 DIAGNOSIS — I70201 Unspecified atherosclerosis of native arteries of extremities, right leg: Secondary | ICD-10-CM | POA: Diagnosis not present

## 2020-11-18 DIAGNOSIS — I69392 Facial weakness following cerebral infarction: Secondary | ICD-10-CM | POA: Diagnosis not present

## 2020-11-18 DIAGNOSIS — R911 Solitary pulmonary nodule: Secondary | ICD-10-CM | POA: Diagnosis not present

## 2020-11-21 DIAGNOSIS — I70202 Unspecified atherosclerosis of native arteries of extremities, left leg: Secondary | ICD-10-CM | POA: Diagnosis not present

## 2020-11-21 DIAGNOSIS — I739 Peripheral vascular disease, unspecified: Secondary | ICD-10-CM | POA: Diagnosis not present

## 2020-11-21 DIAGNOSIS — G8929 Other chronic pain: Secondary | ICD-10-CM | POA: Diagnosis not present

## 2020-11-21 DIAGNOSIS — I5020 Unspecified systolic (congestive) heart failure: Secondary | ICD-10-CM | POA: Diagnosis not present

## 2020-11-21 DIAGNOSIS — M79672 Pain in left foot: Secondary | ICD-10-CM | POA: Diagnosis not present

## 2020-11-21 DIAGNOSIS — I714 Abdominal aortic aneurysm, without rupture: Secondary | ICD-10-CM | POA: Diagnosis not present

## 2020-11-21 DIAGNOSIS — I743 Embolism and thrombosis of arteries of the lower extremities: Secondary | ICD-10-CM | POA: Diagnosis not present

## 2020-11-22 DIAGNOSIS — I5022 Chronic systolic (congestive) heart failure: Secondary | ICD-10-CM | POA: Diagnosis not present

## 2020-11-22 DIAGNOSIS — I69322 Dysarthria following cerebral infarction: Secondary | ICD-10-CM | POA: Diagnosis not present

## 2020-11-22 DIAGNOSIS — R911 Solitary pulmonary nodule: Secondary | ICD-10-CM | POA: Diagnosis not present

## 2020-11-22 DIAGNOSIS — J439 Emphysema, unspecified: Secondary | ICD-10-CM | POA: Diagnosis not present

## 2020-11-22 DIAGNOSIS — F419 Anxiety disorder, unspecified: Secondary | ICD-10-CM | POA: Diagnosis not present

## 2020-11-22 DIAGNOSIS — I69392 Facial weakness following cerebral infarction: Secondary | ICD-10-CM | POA: Diagnosis not present

## 2020-11-22 DIAGNOSIS — I4891 Unspecified atrial fibrillation: Secondary | ICD-10-CM | POA: Diagnosis not present

## 2020-11-22 DIAGNOSIS — E440 Moderate protein-calorie malnutrition: Secondary | ICD-10-CM | POA: Diagnosis not present

## 2020-11-22 DIAGNOSIS — I70201 Unspecified atherosclerosis of native arteries of extremities, right leg: Secondary | ICD-10-CM | POA: Diagnosis not present

## 2020-11-25 DIAGNOSIS — I70201 Unspecified atherosclerosis of native arteries of extremities, right leg: Secondary | ICD-10-CM | POA: Diagnosis not present

## 2020-11-25 DIAGNOSIS — I5022 Chronic systolic (congestive) heart failure: Secondary | ICD-10-CM | POA: Diagnosis not present

## 2020-11-25 DIAGNOSIS — J439 Emphysema, unspecified: Secondary | ICD-10-CM | POA: Diagnosis not present

## 2020-11-25 DIAGNOSIS — I4891 Unspecified atrial fibrillation: Secondary | ICD-10-CM | POA: Diagnosis not present

## 2020-11-25 DIAGNOSIS — I69392 Facial weakness following cerebral infarction: Secondary | ICD-10-CM | POA: Diagnosis not present

## 2020-11-25 DIAGNOSIS — I69322 Dysarthria following cerebral infarction: Secondary | ICD-10-CM | POA: Diagnosis not present

## 2020-11-25 DIAGNOSIS — E440 Moderate protein-calorie malnutrition: Secondary | ICD-10-CM | POA: Diagnosis not present

## 2020-11-25 DIAGNOSIS — F419 Anxiety disorder, unspecified: Secondary | ICD-10-CM | POA: Diagnosis not present

## 2020-11-25 DIAGNOSIS — R911 Solitary pulmonary nodule: Secondary | ICD-10-CM | POA: Diagnosis not present

## 2020-11-29 DIAGNOSIS — I69392 Facial weakness following cerebral infarction: Secondary | ICD-10-CM | POA: Diagnosis not present

## 2020-11-29 DIAGNOSIS — R911 Solitary pulmonary nodule: Secondary | ICD-10-CM | POA: Diagnosis not present

## 2020-11-29 DIAGNOSIS — I70201 Unspecified atherosclerosis of native arteries of extremities, right leg: Secondary | ICD-10-CM | POA: Diagnosis not present

## 2020-11-29 DIAGNOSIS — F419 Anxiety disorder, unspecified: Secondary | ICD-10-CM | POA: Diagnosis not present

## 2020-11-29 DIAGNOSIS — I69322 Dysarthria following cerebral infarction: Secondary | ICD-10-CM | POA: Diagnosis not present

## 2020-11-29 DIAGNOSIS — I5022 Chronic systolic (congestive) heart failure: Secondary | ICD-10-CM | POA: Diagnosis not present

## 2020-11-29 DIAGNOSIS — E440 Moderate protein-calorie malnutrition: Secondary | ICD-10-CM | POA: Diagnosis not present

## 2020-11-29 DIAGNOSIS — I4891 Unspecified atrial fibrillation: Secondary | ICD-10-CM | POA: Diagnosis not present

## 2020-11-29 DIAGNOSIS — J439 Emphysema, unspecified: Secondary | ICD-10-CM | POA: Diagnosis not present

## 2020-12-01 DIAGNOSIS — R911 Solitary pulmonary nodule: Secondary | ICD-10-CM | POA: Diagnosis not present

## 2020-12-01 DIAGNOSIS — F419 Anxiety disorder, unspecified: Secondary | ICD-10-CM | POA: Diagnosis not present

## 2020-12-01 DIAGNOSIS — I70201 Unspecified atherosclerosis of native arteries of extremities, right leg: Secondary | ICD-10-CM | POA: Diagnosis not present

## 2020-12-01 DIAGNOSIS — J439 Emphysema, unspecified: Secondary | ICD-10-CM | POA: Diagnosis not present

## 2020-12-01 DIAGNOSIS — E440 Moderate protein-calorie malnutrition: Secondary | ICD-10-CM | POA: Diagnosis not present

## 2020-12-01 DIAGNOSIS — I69392 Facial weakness following cerebral infarction: Secondary | ICD-10-CM | POA: Diagnosis not present

## 2020-12-01 DIAGNOSIS — I69322 Dysarthria following cerebral infarction: Secondary | ICD-10-CM | POA: Diagnosis not present

## 2020-12-01 DIAGNOSIS — I4891 Unspecified atrial fibrillation: Secondary | ICD-10-CM | POA: Diagnosis not present

## 2020-12-01 DIAGNOSIS — I5022 Chronic systolic (congestive) heart failure: Secondary | ICD-10-CM | POA: Diagnosis not present

## 2020-12-05 DIAGNOSIS — F419 Anxiety disorder, unspecified: Secondary | ICD-10-CM | POA: Diagnosis not present

## 2020-12-05 DIAGNOSIS — R911 Solitary pulmonary nodule: Secondary | ICD-10-CM | POA: Diagnosis not present

## 2020-12-05 DIAGNOSIS — I4891 Unspecified atrial fibrillation: Secondary | ICD-10-CM | POA: Diagnosis not present

## 2020-12-05 DIAGNOSIS — E440 Moderate protein-calorie malnutrition: Secondary | ICD-10-CM | POA: Diagnosis not present

## 2020-12-05 DIAGNOSIS — I5022 Chronic systolic (congestive) heart failure: Secondary | ICD-10-CM | POA: Diagnosis not present

## 2020-12-05 DIAGNOSIS — I69322 Dysarthria following cerebral infarction: Secondary | ICD-10-CM | POA: Diagnosis not present

## 2020-12-05 DIAGNOSIS — J439 Emphysema, unspecified: Secondary | ICD-10-CM | POA: Diagnosis not present

## 2020-12-05 DIAGNOSIS — I70201 Unspecified atherosclerosis of native arteries of extremities, right leg: Secondary | ICD-10-CM | POA: Diagnosis not present

## 2020-12-05 DIAGNOSIS — I69392 Facial weakness following cerebral infarction: Secondary | ICD-10-CM | POA: Diagnosis not present

## 2020-12-07 DIAGNOSIS — J439 Emphysema, unspecified: Secondary | ICD-10-CM | POA: Diagnosis not present

## 2020-12-07 DIAGNOSIS — I69322 Dysarthria following cerebral infarction: Secondary | ICD-10-CM | POA: Diagnosis not present

## 2020-12-07 DIAGNOSIS — I4891 Unspecified atrial fibrillation: Secondary | ICD-10-CM | POA: Diagnosis not present

## 2020-12-07 DIAGNOSIS — I70201 Unspecified atherosclerosis of native arteries of extremities, right leg: Secondary | ICD-10-CM | POA: Diagnosis not present

## 2020-12-07 DIAGNOSIS — E440 Moderate protein-calorie malnutrition: Secondary | ICD-10-CM | POA: Diagnosis not present

## 2020-12-07 DIAGNOSIS — I69392 Facial weakness following cerebral infarction: Secondary | ICD-10-CM | POA: Diagnosis not present

## 2020-12-07 DIAGNOSIS — F419 Anxiety disorder, unspecified: Secondary | ICD-10-CM | POA: Diagnosis not present

## 2020-12-07 DIAGNOSIS — R911 Solitary pulmonary nodule: Secondary | ICD-10-CM | POA: Diagnosis not present

## 2020-12-07 DIAGNOSIS — I5022 Chronic systolic (congestive) heart failure: Secondary | ICD-10-CM | POA: Diagnosis not present

## 2020-12-08 ENCOUNTER — Telehealth: Payer: Self-pay | Admitting: Nurse Practitioner

## 2020-12-08 DIAGNOSIS — I5022 Chronic systolic (congestive) heart failure: Secondary | ICD-10-CM | POA: Diagnosis not present

## 2020-12-08 DIAGNOSIS — R911 Solitary pulmonary nodule: Secondary | ICD-10-CM | POA: Diagnosis not present

## 2020-12-08 DIAGNOSIS — I70201 Unspecified atherosclerosis of native arteries of extremities, right leg: Secondary | ICD-10-CM | POA: Diagnosis not present

## 2020-12-08 DIAGNOSIS — I69322 Dysarthria following cerebral infarction: Secondary | ICD-10-CM | POA: Diagnosis not present

## 2020-12-08 DIAGNOSIS — I4891 Unspecified atrial fibrillation: Secondary | ICD-10-CM | POA: Diagnosis not present

## 2020-12-08 DIAGNOSIS — F419 Anxiety disorder, unspecified: Secondary | ICD-10-CM | POA: Diagnosis not present

## 2020-12-08 DIAGNOSIS — E440 Moderate protein-calorie malnutrition: Secondary | ICD-10-CM | POA: Diagnosis not present

## 2020-12-08 DIAGNOSIS — I69392 Facial weakness following cerebral infarction: Secondary | ICD-10-CM | POA: Diagnosis not present

## 2020-12-08 DIAGNOSIS — J439 Emphysema, unspecified: Secondary | ICD-10-CM | POA: Diagnosis not present

## 2020-12-08 NOTE — Telephone Encounter (Signed)
Patel with [facility] health called saying pt swelling in his feet.  Getting few in the last few days.  Please advise  CB#  [phone]

## 2020-12-09 ENCOUNTER — Other Ambulatory Visit: Payer: Self-pay

## 2020-12-09 ENCOUNTER — Telehealth (INDEPENDENT_AMBULATORY_CARE_PROVIDER_SITE_OTHER): Payer: Medicare HMO | Admitting: Physician Assistant

## 2020-12-09 DIAGNOSIS — I509 Heart failure, unspecified: Secondary | ICD-10-CM | POA: Diagnosis not present

## 2020-12-09 DIAGNOSIS — I5020 Unspecified systolic (congestive) heart failure: Secondary | ICD-10-CM

## 2020-12-09 MED ORDER — FUROSEMIDE 20 MG PO TABS
10.0000 mg | ORAL_TABLET | Freq: Every day | ORAL | 0 refills | Status: DC
Start: 2020-12-09 — End: 2020-12-15

## 2020-12-09 MED ORDER — POTASSIUM CHLORIDE ER 10 MEQ PO TBCR: 10.0000 meq | EXTENDED_RELEASE_TABLET | Freq: Every day | ORAL | 0 refills | Status: DC

## 2020-12-09 NOTE — Telephone Encounter (Signed)
Patient's son stated he is returning a call regarding his father.  He would like the nurse to call him regarding the swelling in his feet.  CB# [phone]

## 2020-12-09 NOTE — Telephone Encounter (Signed)
The pt son was called and informed that we can schedule his father for an appt today. He ask if the appt could be mychart or telehealth visit because they are 2.5 hrs away. Appt scheduled for this afternoon at 11:20am.

## 2020-12-09 NOTE — Progress Notes (Signed)
MyChart Video Visit    Virtual Visit via Video Note   This visit type was conducted due to national recommendations for restrictions regarding the COVID-19 Pandemic (e.g. social distancing) in an effort to limit this patient's exposure and mitigate transmission in our community. This patient is at least at moderate risk for complications without adequate follow up. This format is felt to be most appropriate for this patient at this time. Physical exam was limited by quality of the video and audio technology used for the visit.   Patient location: Home Provider location: Office   I discussed the limitations of evaluation and management by telemedicine and the availability of in person appointments. The patient expressed understanding and agreed to proceed.  Patient: Ricardo GARDELLA Sr.   DOB: 1955-10-09   65 y.o. Male  MRN: [id number] Visit Date: 12/09/2020  Today's healthcare provider: Trinna Post, PA-C   Chief Complaint  Patient presents with  . Foot Swelling    3+ Pitting edema in feet w/ history of CHF   Subjective    HPI HPI    Foot Swelling    Comments: 3+ Pitting edema in feet w/ history of CHF       Last edited by [name], CMA on 12/09/2020 11:07 AM. (History)      Patient with history of CHF EF 20% and hospital admission in 09/2020 for the same, severe COPD,  presents today with significant bilateral lower extremity edema. Breathing normally, no SOB. Patient gained 3 lbs over 3-4 days. Fluid restriction < 2L. No abdominal swelling. No vomiting. 102/66 was BP today. Son Randall Hiss is ICU nurse, reports listening to his lungs and they are clear. Currently on spironolactone but no other fluid pill. Living in [place] and unable to come to clinic today.     Medications: Outpatient Medications Prior to Visit  Medication Sig  . apixaban (ELIQUIS) 5 MG TABS tablet Take 1 tablet (5 mg total) by mouth 2 (two) times daily.  [name] atorvastatin (LIPITOR) 80 MG tablet  Take 1 tablet (80 mg total) by mouth daily.  . digoxin (LANOXIN) 0.25 MG tablet Take 1 tablet (0.25 mg total) by mouth daily.  . DULoxetine (CYMBALTA) 60 MG capsule Take 1 capsule (60 mg total) by mouth daily.  . empagliflozin (JARDIANCE) 10 MG TABS tablet Take 1 tablet (10 mg total) by mouth daily before breakfast.  . Fluticasone-Umeclidin-Vilant (TRELEGY ELLIPTA) 100-62.5-25 MCG/INH AEPB Inhale 1 application into the lungs daily.  [name] gabapentin (NEURONTIN) 400 MG capsule Take 1 capsule (400 mg total) by mouth 3 (three) times daily.  . metoprolol succinate (TOPROL-XL) 25 MG 24 hr tablet Take 2 tablets (50 mg total) by mouth daily.  [name] omeprazole (PRILOSEC) 20 MG capsule Take 1 capsule (20 mg total) by mouth daily.  . sildenafil (REVATIO) 20 MG tablet TAKE 3-5 PILLS AS NEEDED PRIOR TO SEX (Patient not taking: Reported on 11/10/2020)  . [DISCONTINUED] spironolactone (ALDACTONE) 25 MG tablet Take 0.5 tablets (12.5 mg total) by mouth daily.   No facility-administered medications prior to visit.    Review of Systems    Objective    There were no vitals taken for this visit.   Physical Exam Constitutional:      Appearance: Normal appearance.  Pulmonary:     Effort: Pulmonary effort is normal. No respiratory distress.  Neurological:     Mental Status: He is alert.  Psychiatric:        Mood and Affect: Mood normal.  Behavior: Behavior normal.        Assessment & Plan    1. HFrEF (heart failure with reduced ejection fraction) ([place])  Counseled about limited evaluation virtually. Will start Lasix and potassium as below. Would normally get baseline renal function and CXR if in person. Would be cautious about watching BP, return precautions counseled. Counseled if worsening will need to be seen in the ER. He will be able to come to the clinic Tuesday 12/13/2020.   - furosemide (LASIX) 20 MG tablet; Take 0.5 tablets (10 mg total) by mouth daily.  Dispense: 90 tablet; Refill: 0 -  potassium chloride (KLOR-CON) 10 MEQ tablet; Take 1 tablet (10 mEq total) by mouth daily.  Dispense: 90 tablet; Refill: 0  2. Acute on chronic congestive heart failure, unspecified heart failure type ([place])  See #1.     No follow-ups on file.     I discussed the assessment and treatment plan with the patient. The patient was provided an opportunity to ask questions and all were answered. The patient agreed with the plan and demonstrated an understanding of the instructions.   The patient was advised to call back or seek an in-person evaluation if the symptoms worsen or if the condition fails to improve as anticipated.  Trinna Post, PA-C [facility] [phone] (phone) [phone] (fax)  [place]

## 2020-12-13 ENCOUNTER — Ambulatory Visit: Payer: Medicare HMO | Admitting: Family Medicine

## 2020-12-14 DIAGNOSIS — I5022 Chronic systolic (congestive) heart failure: Secondary | ICD-10-CM | POA: Diagnosis not present

## 2020-12-14 DIAGNOSIS — I70201 Unspecified atherosclerosis of native arteries of extremities, right leg: Secondary | ICD-10-CM | POA: Diagnosis not present

## 2020-12-14 DIAGNOSIS — E440 Moderate protein-calorie malnutrition: Secondary | ICD-10-CM | POA: Diagnosis not present

## 2020-12-14 DIAGNOSIS — F419 Anxiety disorder, unspecified: Secondary | ICD-10-CM | POA: Diagnosis not present

## 2020-12-14 DIAGNOSIS — J439 Emphysema, unspecified: Secondary | ICD-10-CM | POA: Diagnosis not present

## 2020-12-14 DIAGNOSIS — I69392 Facial weakness following cerebral infarction: Secondary | ICD-10-CM | POA: Diagnosis not present

## 2020-12-14 DIAGNOSIS — I69322 Dysarthria following cerebral infarction: Secondary | ICD-10-CM | POA: Diagnosis not present

## 2020-12-14 DIAGNOSIS — I4891 Unspecified atrial fibrillation: Secondary | ICD-10-CM | POA: Diagnosis not present

## 2020-12-14 DIAGNOSIS — R911 Solitary pulmonary nodule: Secondary | ICD-10-CM | POA: Diagnosis not present

## 2020-12-15 ENCOUNTER — Other Ambulatory Visit: Payer: Self-pay

## 2020-12-15 ENCOUNTER — Encounter: Payer: Self-pay | Admitting: Family Medicine

## 2020-12-15 ENCOUNTER — Ambulatory Visit (INDEPENDENT_AMBULATORY_CARE_PROVIDER_SITE_OTHER): Payer: Medicare HMO | Admitting: Family Medicine

## 2020-12-15 VITALS — BP 99/68 | HR 86 | Temp 97.5°F | Ht 74.0 in | Wt 137.0 lb

## 2020-12-15 DIAGNOSIS — I5020 Unspecified systolic (congestive) heart failure: Secondary | ICD-10-CM | POA: Diagnosis not present

## 2020-12-15 DIAGNOSIS — R600 Localized edema: Secondary | ICD-10-CM | POA: Diagnosis not present

## 2020-12-15 DIAGNOSIS — J984 Other disorders of lung: Secondary | ICD-10-CM | POA: Diagnosis not present

## 2020-12-15 DIAGNOSIS — A310 Pulmonary mycobacterial infection: Secondary | ICD-10-CM | POA: Diagnosis not present

## 2020-12-15 DIAGNOSIS — J449 Chronic obstructive pulmonary disease, unspecified: Secondary | ICD-10-CM | POA: Diagnosis not present

## 2020-12-15 NOTE — Progress Notes (Signed)
Subjective:    Patient ID: Ricardo Folks Sr., male    DOB: September 07, 1955, 65 y.o.   MRN: [id number]  Ricardo HEYWARD Sr. is a 65 y.o. male presenting on 12/15/2020 for Foot Swelling (Pt states the past 3-5 days the swelling on his both feet has got worst, Lasix medication has not helped even though the dose was increased. Pt believes the swelling has started go above the ankle. The swelling happens whether he is resting or on his feet. He has notice when he puts his feet up the swelling tends to go down a little.)  Previous PCP Cassell Smiles, AGPCNP-BC   HPI   Systolic CHF, Lower Extremity Edema - Last visit telemedicine with Carles Collet PA on 12/09/20, for initial visit for same problem swelling, previously at the hospital in [facility]) for Severe COPD and Systolic CHF exacerbation, there was worry about having a stroke while on airplane previously.  - He is on a 2 L fluid restriction. At last visit he was gaining some weight up to 3 lbs in 3 days, and he had worsening lower extremity edema. - he was treated with Furosemide 29m, taking half tab for 162m then up to 2080maily. Limited results. Weight has not increased much. R leg swells more than L, chronic issue. He normally can elevated legs for RICE therapy with improvement, but still seems to help but not resolving the issue. On his feet more recently. - He monitors BP closely at home  He has upcoming Infectious Disease apt today, for abnormal issue in his lung. Followed by Vascular surgeon 2-3 week next apt as well.  Denies dyspnea, cough, chest pain  [facility] 09/19/2017 03/12/2017 12/23/2015  Decreased Interest 2 0 1  Down, Depressed, Hopeless 1 0 1  PHQ - 2 Score 3 0 2  Altered sleeping 1 - 0  Tired, decreased energy 1 - 1  Change in appetite 0 - 0  Feeling bad or failure about yourself  2 - 1  Trouble concentrating 0 - 0  Moving slowly or fidgety/restless 0 - 0  Suicidal thoughts 0 - 0  PHQ-9 Score 7 - 4   Difficult doing work/chores Somewhat difficult - Not difficult at all    Social History   Tobacco Use  . Smoking status: Current Every Day Smoker    Packs/day: 0.50    Years: 40.00    Pack years: 20.00    Types: Cigarettes  . Smokeless tobacco: Never Used  Vaping Use  . Vaping Use: Former  Substance Use Topics  . Alcohol use: Yes    Alcohol/week: 2.0 standard drinks    Types: 2 Shots of liquor per week    Comment: occasional (every 3-4 weeks)   . Drug use: No    Comment: polysubstance approx 2 years ago    Review of Systems Per HPI unless specifically indicated above     Objective:    BP 99/68 (BP Location: Left Arm, Patient Position: Sitting, Cuff Size: Normal)   Pulse 86   Temp (!) 97.5 F (36.4 C) (Temporal)   Ht _0  (1.88 m)   Wt 137 lb (62.1 kg)   SpO2 98%   BMI 17.59 kg/m   Wt Readings from Last 3 Encounters:  12/15/20 137 lb (62.1 kg)  11/10/20 136 lb (61.7 kg)  12/18/17 169 lb 6.4 oz (76.8 kg)    Physical Exam Vitals and nursing note reviewed.  Constitutional:      General:  He is not in acute distress.    Appearance: He is well-developed. He is not diaphoretic.     Comments: Well-appearing, comfortable, cooperative  HENT:     Head: Normocephalic and atraumatic.  Eyes:     General:        Right eye: No discharge.        Left eye: No discharge.     Conjunctiva/sclera: Conjunctivae normal.  Neck:     Thyroid: No thyromegaly.  Cardiovascular:     Rate and Rhythm: Normal rate and regular rhythm.     Heart sounds: Normal heart sounds. No murmur heard.   Pulmonary:     Effort: Pulmonary effort is normal. No respiratory distress.     Breath sounds: Normal breath sounds. No wheezing or rales.  Musculoskeletal:        General: Normal range of motion.     Cervical back: Normal range of motion and neck supple.     Right lower leg: Edema (+2 pitting) present.     Left lower leg: Edema (+1 pitting) present.     Comments: Swelling to lower mid leg,  foot/toes ankle included  Lymphadenopathy:     Cervical: No cervical adenopathy.  Skin:    General: Skin is warm and dry.     Findings: No erythema or rash.  Neurological:     Mental Status: He is alert and oriented to person, place, and time.  Psychiatric:        Behavior: Behavior normal.     Comments: Well groomed, good eye contact, normal speech and thoughts    Results for orders placed or performed in visit on 11/10/20  CBC with Differential/Platelet  Result Value Ref Range   WBC 7.0 3.4 - 10.8 x10E3/uL   RBC 4.20 4.14 - 5.80 x10E6/uL   Hemoglobin 12.9 (L) 13.0 - 17.7 g/dL   Hematocrit 40.0 37.5 - 51.0 %   MCV 95 79 - 97 fL   MCH 30.7 26.6 - 33.0 pg   MCHC 32.3 31.5 - 35.7 g/dL   RDW 14.4 11.6 - 15.4 %   Platelets 287 150 - 450 x10E3/uL   Neutrophils 75 Not Estab. %   Lymphs 12 Not Estab. %   Monocytes 8 Not Estab. %   Eos 3 Not Estab. %   Basos 1 Not Estab. %   Neutrophils Absolute 5.3 1.4 - 7.0 x10E3/uL   Lymphocytes Absolute 0.9 0.7 - 3.1 x10E3/uL   Monocytes Absolute 0.6 0.1 - 0.9 x10E3/uL   EOS (ABSOLUTE) 0.2 0.0 - 0.4 x10E3/uL   Basophils Absolute 0.0 0.0 - 0.2 x10E3/uL   Immature Granulocytes 1 Not Estab. %   Immature Grans (Abs) 0.0 0.0 - 0.1 x10E3/uL  Comprehensive metabolic panel  Result Value Ref Range   Glucose 102 (H) 65 - 99 mg/dL   BUN 15 8 - 27 mg/dL   Creatinine, Ser 0.67 (L) 0.76 - 1.27 mg/dL   eGFR 105 >59 mL/min/1.73   BUN/Creatinine Ratio 22 10 - 24   Sodium 137 134 - 144 mmol/L   Potassium 4.3 3.5 - 5.2 mmol/L   Chloride 98 96 - 106 mmol/L   CO2 21 20 - 29 mmol/L   Calcium 10.0 8.6 - 10.2 mg/dL   Total Protein 6.9 6.0 - 8.5 g/dL   Albumin 4.1 3.8 - 4.8 g/dL   Globulin, Total 2.8 1.5 - 4.5 g/dL   Albumin/Globulin Ratio 1.5 1.2 - 2.2   Bilirubin Total 1.1 0.0 - 1.2 mg/dL   Alkaline  Phosphatase 138 (H) 44 - 121 IU/L   AST 29 0 - 40 IU/L   ALT 39 0 - 44 IU/L  Digoxin level  Result Value Ref Range   Digoxin, Serum 1.1 (H) 0.5 - 0.9  ng/mL      Assessment & Plan:   Problem List Items Addressed This Visit    HFrEF (heart failure with reduced ejection fraction) (HCC)   Relevant Medications   furosemide (LASIX) 20 MG tablet    Other Visit Diagnoses    Bilateral lower extremity edema    -  Primary      Clinically with persistent volume up with lower extremity edema and wt slightly up Lungs remain clear on exam, not symptomatic. Pulse ox 98%. No labored breathing or any crackles. Only on low dose lasix for past 6 days, half tab of 23m Will increase diuresis today with Lasix 276mx 2 = 4072maily for 1 week then can repeat if need or adjust to lower dose as advised if improved. Would recommend checking chemistry panel for electrolytes and kidney function within 2 weeks after adjusted lasix dosing. Considered CXR today but deferred due to normal exam and lack of respiratory symptoms. He has upcoming ID apt and will be receiving lung imaging regardless later today   No orders of the defined types were placed in this encounter.     Follow up plan: Return in about 2 weeks (around 12/29/2020) for 2-3 weeks with RegRollene Farer Lower Extremity Swelling / CHF.   AleNobie PutnamO [facility] 12/15/2020, 10:38 AM

## 2020-12-15 NOTE — Patient Instructions (Addendum)
Thank you for coming to the office today.  Increase Furosemide (Lasix) now take 20mg  tab x 2 = 40mg  once daily take both at same time for better result.  Increase this for about 1 week, then see how your swelling is doing, if need to continue can use for another 1 week.  If doing better then you can reduce amount back to 1 whole pill 20mg  or lowest is half pill.  Keep track of weight.  Will need blood work in 2 weeks for chemistry to follow-up on kidney function.   Please schedule a Follow-up Appointment to: Return in about 2 weeks (around 12/29/2020) for 2-3 weeks with Rollene Fare for Lower Extremity Swelling / CHF.  If you have any other questions or concerns, please feel free to call the office or send a message through [place]. You may also schedule an earlier appointment if necessary.  Additionally, you may be receiving a survey about your experience at our office within a few days to 1 week by e-mail or mail. We value your feedback.  Nobie Putnam, DO [place]

## 2020-12-16 DIAGNOSIS — I69322 Dysarthria following cerebral infarction: Secondary | ICD-10-CM | POA: Diagnosis not present

## 2020-12-16 DIAGNOSIS — I70201 Unspecified atherosclerosis of native arteries of extremities, right leg: Secondary | ICD-10-CM | POA: Diagnosis not present

## 2020-12-16 DIAGNOSIS — E440 Moderate protein-calorie malnutrition: Secondary | ICD-10-CM | POA: Diagnosis not present

## 2020-12-16 DIAGNOSIS — I69392 Facial weakness following cerebral infarction: Secondary | ICD-10-CM | POA: Diagnosis not present

## 2020-12-16 DIAGNOSIS — I5022 Chronic systolic (congestive) heart failure: Secondary | ICD-10-CM | POA: Diagnosis not present

## 2020-12-16 DIAGNOSIS — F419 Anxiety disorder, unspecified: Secondary | ICD-10-CM | POA: Diagnosis not present

## 2020-12-16 DIAGNOSIS — R911 Solitary pulmonary nodule: Secondary | ICD-10-CM | POA: Diagnosis not present

## 2020-12-16 DIAGNOSIS — J439 Emphysema, unspecified: Secondary | ICD-10-CM | POA: Diagnosis not present

## 2020-12-16 DIAGNOSIS — I4891 Unspecified atrial fibrillation: Secondary | ICD-10-CM | POA: Diagnosis not present

## 2020-12-19 DIAGNOSIS — F419 Anxiety disorder, unspecified: Secondary | ICD-10-CM | POA: Diagnosis not present

## 2020-12-19 DIAGNOSIS — I5022 Chronic systolic (congestive) heart failure: Secondary | ICD-10-CM | POA: Diagnosis not present

## 2020-12-19 DIAGNOSIS — R911 Solitary pulmonary nodule: Secondary | ICD-10-CM | POA: Diagnosis not present

## 2020-12-19 DIAGNOSIS — I4891 Unspecified atrial fibrillation: Secondary | ICD-10-CM | POA: Diagnosis not present

## 2020-12-19 DIAGNOSIS — I6930 Unspecified sequelae of cerebral infarction: Secondary | ICD-10-CM | POA: Diagnosis not present

## 2020-12-19 DIAGNOSIS — J439 Emphysema, unspecified: Secondary | ICD-10-CM | POA: Diagnosis not present

## 2020-12-19 DIAGNOSIS — I69322 Dysarthria following cerebral infarction: Secondary | ICD-10-CM | POA: Diagnosis not present

## 2020-12-19 DIAGNOSIS — I70201 Unspecified atherosclerosis of native arteries of extremities, right leg: Secondary | ICD-10-CM | POA: Diagnosis not present

## 2020-12-19 DIAGNOSIS — E440 Moderate protein-calorie malnutrition: Secondary | ICD-10-CM | POA: Diagnosis not present

## 2020-12-19 DIAGNOSIS — I69392 Facial weakness following cerebral infarction: Secondary | ICD-10-CM | POA: Diagnosis not present

## 2020-12-23 DIAGNOSIS — E440 Moderate protein-calorie malnutrition: Secondary | ICD-10-CM | POA: Diagnosis not present

## 2020-12-23 DIAGNOSIS — J439 Emphysema, unspecified: Secondary | ICD-10-CM | POA: Diagnosis not present

## 2020-12-23 DIAGNOSIS — M6281 Muscle weakness (generalized): Secondary | ICD-10-CM | POA: Diagnosis not present

## 2020-12-23 DIAGNOSIS — R911 Solitary pulmonary nodule: Secondary | ICD-10-CM | POA: Diagnosis not present

## 2020-12-23 DIAGNOSIS — I5022 Chronic systolic (congestive) heart failure: Secondary | ICD-10-CM | POA: Diagnosis not present

## 2020-12-23 DIAGNOSIS — I70201 Unspecified atherosclerosis of native arteries of extremities, right leg: Secondary | ICD-10-CM | POA: Diagnosis not present

## 2020-12-23 DIAGNOSIS — I69392 Facial weakness following cerebral infarction: Secondary | ICD-10-CM | POA: Diagnosis not present

## 2020-12-23 DIAGNOSIS — I69398 Other sequelae of cerebral infarction: Secondary | ICD-10-CM | POA: Diagnosis not present

## 2020-12-23 DIAGNOSIS — I69322 Dysarthria following cerebral infarction: Secondary | ICD-10-CM | POA: Diagnosis not present

## 2020-12-29 DIAGNOSIS — R911 Solitary pulmonary nodule: Secondary | ICD-10-CM | POA: Diagnosis not present

## 2020-12-29 DIAGNOSIS — I69398 Other sequelae of cerebral infarction: Secondary | ICD-10-CM | POA: Diagnosis not present

## 2020-12-29 DIAGNOSIS — J439 Emphysema, unspecified: Secondary | ICD-10-CM | POA: Diagnosis not present

## 2020-12-29 DIAGNOSIS — E440 Moderate protein-calorie malnutrition: Secondary | ICD-10-CM | POA: Diagnosis not present

## 2020-12-29 DIAGNOSIS — I69322 Dysarthria following cerebral infarction: Secondary | ICD-10-CM | POA: Diagnosis not present

## 2020-12-29 DIAGNOSIS — I5022 Chronic systolic (congestive) heart failure: Secondary | ICD-10-CM | POA: Diagnosis not present

## 2020-12-29 DIAGNOSIS — I69392 Facial weakness following cerebral infarction: Secondary | ICD-10-CM | POA: Diagnosis not present

## 2020-12-29 DIAGNOSIS — I70201 Unspecified atherosclerosis of native arteries of extremities, right leg: Secondary | ICD-10-CM | POA: Diagnosis not present

## 2020-12-29 DIAGNOSIS — M6281 Muscle weakness (generalized): Secondary | ICD-10-CM | POA: Diagnosis not present

## 2021-01-02 ENCOUNTER — Ambulatory Visit: Payer: Medicare HMO | Admitting: Internal Medicine

## 2021-01-02 DIAGNOSIS — I69398 Other sequelae of cerebral infarction: Secondary | ICD-10-CM | POA: Diagnosis not present

## 2021-01-02 DIAGNOSIS — I5022 Chronic systolic (congestive) heart failure: Secondary | ICD-10-CM | POA: Diagnosis not present

## 2021-01-02 DIAGNOSIS — I69392 Facial weakness following cerebral infarction: Secondary | ICD-10-CM | POA: Diagnosis not present

## 2021-01-02 DIAGNOSIS — E440 Moderate protein-calorie malnutrition: Secondary | ICD-10-CM | POA: Diagnosis not present

## 2021-01-02 DIAGNOSIS — I70201 Unspecified atherosclerosis of native arteries of extremities, right leg: Secondary | ICD-10-CM | POA: Diagnosis not present

## 2021-01-02 DIAGNOSIS — J439 Emphysema, unspecified: Secondary | ICD-10-CM | POA: Diagnosis not present

## 2021-01-02 DIAGNOSIS — I69322 Dysarthria following cerebral infarction: Secondary | ICD-10-CM | POA: Diagnosis not present

## 2021-01-02 DIAGNOSIS — M6281 Muscle weakness (generalized): Secondary | ICD-10-CM | POA: Diagnosis not present

## 2021-01-02 DIAGNOSIS — R911 Solitary pulmonary nodule: Secondary | ICD-10-CM | POA: Diagnosis not present

## 2021-01-02 NOTE — Progress Notes (Deleted)
Subjective:    Patient ID: Ricardo Folks Sr., male    DOB: 21-Feb-1956, 65 y.o.   MRN: [id number]  HPI    Review of Systems      Past Medical History:  Diagnosis Date  . Arthritis    Rheumatoid  . Bipolar disorder ([place])   . Complication of anesthesia    difficulty keeping patient asleep. fentanyl makes patient mean, grumpy and difficult  . Depression   . Fusion of spine   . GERD (gastroesophageal reflux disease)    slightly uncomfortable. takes otc antacids occasionally  . Headache    deteriorating discs causing headaches. uses advil  . Hepatitis C    HEP "C". treated 2 years ago  . History of kidney stones 2016  . Hyperlipidemia   . Pericarditis    20 years ago  . Peripheral vascular disease ([place])   . Smoker     Current Outpatient Medications  Medication Sig Dispense Refill  . apixaban (ELIQUIS) 5 MG TABS tablet Take 1 tablet (5 mg total) by mouth 2 (two) times daily. 180 tablet 1  . atorvastatin (LIPITOR) 80 MG tablet Take 1 tablet (80 mg total) by mouth daily. 90 tablet 3  . digoxin (LANOXIN) 0.25 MG tablet Take 1 tablet (0.25 mg total) by mouth daily. 90 tablet 1  . DULoxetine (CYMBALTA) 60 MG capsule Take 1 capsule (60 mg total) by mouth daily. 90 capsule 1  . empagliflozin (JARDIANCE) 10 MG TABS tablet Take 1 tablet (10 mg total) by mouth daily before breakfast. 90 tablet 1  . Fluticasone-Umeclidin-Vilant (TRELEGY ELLIPTA) 100-62.5-25 MCG/INH AEPB Inhale 1 application into the lungs daily.    . furosemide (LASIX) 20 MG tablet Take 2 tablets (40 mg total) by mouth daily. 90 tablet 0  . gabapentin (NEURONTIN) 400 MG capsule Take 1 capsule (400 mg total) by mouth 3 (three) times daily. 90 capsule 1  . metoprolol succinate (TOPROL-XL) 25 MG 24 hr tablet Take 2 tablets (50 mg total) by mouth daily. 180 tablet 3  . omeprazole (PRILOSEC) 20 MG capsule Take 1 capsule (20 mg total) by mouth daily. 90 capsule 1  . potassium chloride (KLOR-CON) 10 MEQ tablet Take 1  tablet (10 mEq total) by mouth daily. 90 tablet 0  . sildenafil (REVATIO) 20 MG tablet TAKE 3-5 PILLS AS NEEDED PRIOR TO SEX (Patient not taking: No sig reported) 30 tablet 1   No current facility-administered medications for this visit.    Allergies  Allergen Reactions  . Chantix [Varenicline] Other (See Comments)    Depression    Family History  Problem Relation Age of Onset  . Cancer Mother        liver  . Heart disease Father   . Diabetes Father   . Breast cancer Sister   . Brain cancer Sister     Social History   Socioeconomic History  . Marital status: Single    Spouse name: Not on file  . Number of children: Not on file  . Years of education: Not on file  . Highest education level: Not on file  Occupational History  . Not on file  Tobacco Use  . Smoking status: Current Every Day Smoker    Packs/day: 0.50    Years: 40.00    Pack years: 20.00    Types: Cigarettes  . Smokeless tobacco: Never Used  Vaping Use  . Vaping Use: Former  Substance and Sexual Activity  . Alcohol use: Yes    Alcohol/week: 2.0  standard drinks    Types: 2 Shots of liquor per week    Comment: occasional (every 3-4 weeks)   . Drug use: No    Comment: polysubstance approx 2 years ago  . Sexual activity: Not Currently  Other Topics Concern  . Not on file  Social History Narrative  . Not on file   Social Determinants of Health   Financial Resource Strain: Not on file  Food Insecurity: Not on file  Transportation Needs: Not on file  Physical Activity: Not on file  Stress: Not on file  Social Connections: Not on file  Intimate Partner Violence: Not on file     Constitutional: Denies fever, malaise, fatigue, headache or abrupt weight changes.  HEENT: Denies eye pain, eye redness, ear pain, ringing in the ears, wax buildup, runny nose, nasal congestion, bloody nose, or sore throat. Respiratory: Denies difficulty breathing, shortness of breath, cough or sputum production.    Cardiovascular: Denies chest pain, chest tightness, palpitations or swelling in the hands or feet.  Gastrointestinal: Denies abdominal pain, bloating, constipation, diarrhea or blood in the stool.  GU: Denies urgency, frequency, pain with urination, burning sensation, blood in urine, odor or discharge. Musculoskeletal: Denies decrease in range of motion, difficulty with gait, muscle pain or joint pain and swelling.  Skin: Denies redness, rashes, lesions or ulcercations.  Neurological: Denies dizziness, difficulty with memory, difficulty with speech or problems with balance and coordination.  Psych: Denies anxiety, depression, SI/HI.  No other specific complaints in a complete review of systems (except as listed in HPI above).  Objective:   Physical Exam    There were no vitals taken for this visit. Wt Readings from Last 3 Encounters:  12/15/20 137 lb (62.1 kg)  11/10/20 136 lb (61.7 kg)  12/18/17 169 lb 6.4 oz (76.8 kg)    General: Appears their stated age, well developed, well nourished in NAD. Skin: Warm, dry and intact. No rashes, lesions or ulcerations noted. HEENT: Head: normal shape and size; Eyes: sclera white, no icterus, conjunctiva pink, PERRLA and EOMs intact; Ears: Tm's gray and intact, normal light reflex; Nose: mucosa pink and moist, septum midline; Throat/Mouth: Teeth present, mucosa pink and moist, no exudate, lesions or ulcerations noted.  Neck:  Neck supple, trachea midline. No masses, lumps or thyromegaly present.  Cardiovascular: Normal rate and rhythm. S1,S2 noted.  No murmur, rubs or gallops noted. No JVD or BLE edema. No carotid bruits noted. Pulmonary/Chest: Normal effort and positive vesicular breath sounds. No respiratory distress. No wheezes, rales or ronchi noted.  Abdomen: Soft and nontender. Normal bowel sounds. No distention or masses noted. Liver, spleen and kidneys non palpable. Musculoskeletal: Normal range of motion. No signs of joint swelling. No  difficulty with gait.  Neurological: Alert and oriented. Cranial nerves II-XII grossly intact. Coordination normal.  Psychiatric: Mood and affect normal. Behavior is normal. Judgment and thought content normal.   EKG:  BMET    Component Value Date/Time   NA 137 11/14/2020 1409   K 4.3 11/14/2020 1409   CL 98 11/14/2020 1409   CO2 21 11/14/2020 1409   GLUCOSE 102 (H) 11/14/2020 1409   GLUCOSE 110 (H) 12/18/2017 1137   BUN 15 11/14/2020 1409   CREATININE 0.67 (L) 11/14/2020 1409   CREATININE 0.87 12/18/2017 1137   CALCIUM 10.0 11/14/2020 1409   GFRNONAA 94 12/18/2017 1137   GFRAA 109 12/18/2017 1137    Lipid Panel     Component Value Date/Time   CHOL 217 (H) 12/23/2015 1606  TRIG 248 (H) 12/23/2015 1606   HDL 29 (L) 12/23/2015 1606   CHOLHDL 7.5 (H) 12/23/2015 1606   LDLCALC 138 (H) 12/23/2015 1606    CBC    Component Value Date/Time   WBC 7.0 11/14/2020 1409   WBC 5.3 12/18/2017 1137   RBC 4.20 11/14/2020 1409   RBC 4.67 12/18/2017 1137   HGB 12.9 (L) 11/14/2020 1409   HCT 40.0 11/14/2020 1409   PLT 287 11/14/2020 1409   MCV 95 11/14/2020 1409   MCH 30.7 11/14/2020 1409   MCH 32.3 12/18/2017 1137   MCHC 32.3 11/14/2020 1409   MCHC 34.2 12/18/2017 1137   RDW 14.4 11/14/2020 1409   LYMPHSABS 0.9 11/14/2020 1409   MONOABS 0.4 06/13/2017 1502   EOSABS 0.2 11/14/2020 1409   BASOSABS 0.0 11/14/2020 1409    Hgb A1C No results found for: HGBA1C        Assessment & Plan:    Webb Silversmith, NP This visit occurred during the SARS-CoV-2 public health emergency.  Safety protocols were in place, including screening questions prior to the visit, additional usage of staff PPE, and extensive cleaning of exam room while observing appropriate contact time as indicated for disinfecting solutions.

## 2021-01-05 DIAGNOSIS — I69398 Other sequelae of cerebral infarction: Secondary | ICD-10-CM | POA: Diagnosis not present

## 2021-01-05 DIAGNOSIS — J439 Emphysema, unspecified: Secondary | ICD-10-CM | POA: Diagnosis not present

## 2021-01-05 DIAGNOSIS — R911 Solitary pulmonary nodule: Secondary | ICD-10-CM | POA: Diagnosis not present

## 2021-01-05 DIAGNOSIS — M6281 Muscle weakness (generalized): Secondary | ICD-10-CM | POA: Diagnosis not present

## 2021-01-05 DIAGNOSIS — I69392 Facial weakness following cerebral infarction: Secondary | ICD-10-CM | POA: Diagnosis not present

## 2021-01-05 DIAGNOSIS — E440 Moderate protein-calorie malnutrition: Secondary | ICD-10-CM | POA: Diagnosis not present

## 2021-01-05 DIAGNOSIS — I5022 Chronic systolic (congestive) heart failure: Secondary | ICD-10-CM | POA: Diagnosis not present

## 2021-01-05 DIAGNOSIS — I70201 Unspecified atherosclerosis of native arteries of extremities, right leg: Secondary | ICD-10-CM | POA: Diagnosis not present

## 2021-01-05 DIAGNOSIS — I69322 Dysarthria following cerebral infarction: Secondary | ICD-10-CM | POA: Diagnosis not present

## 2021-01-06 ENCOUNTER — Other Ambulatory Visit: Payer: Self-pay

## 2021-01-06 ENCOUNTER — Encounter: Payer: Self-pay | Admitting: Internal Medicine

## 2021-01-06 ENCOUNTER — Ambulatory Visit (INDEPENDENT_AMBULATORY_CARE_PROVIDER_SITE_OTHER): Payer: Medicare Other | Admitting: Internal Medicine

## 2021-01-06 ENCOUNTER — Telehealth: Payer: Self-pay | Admitting: Nurse Practitioner

## 2021-01-06 VITALS — BP 118/73 | HR 68 | Temp 97.8°F | Resp 18 | Ht 74.0 in | Wt 140.8 lb

## 2021-01-06 DIAGNOSIS — I5020 Unspecified systolic (congestive) heart failure: Secondary | ICD-10-CM | POA: Diagnosis not present

## 2021-01-06 DIAGNOSIS — R609 Edema, unspecified: Secondary | ICD-10-CM

## 2021-01-06 DIAGNOSIS — I70213 Atherosclerosis of native arteries of extremities with intermittent claudication, bilateral legs: Secondary | ICD-10-CM

## 2021-01-06 NOTE — Patient Instructions (Signed)
Peripheral Edema  Peripheral edema is swelling that is caused by a buildup of fluid. Peripheral edema most often affects the lower legs, ankles, and feet. It can also develop in the arms, hands, and face. The area of the body that has peripheral edema will look swollen. It may also feel heavy or warm. Your clothes may start to feel tight. Pressing on the area may make a temporary dent in your skin. You may not be able to move your swollen arm or leg as much as usual. There are many causes of peripheral edema. It can happen because of a complication of other conditions such as congestive heart failure, kidney disease, or a problem with your blood circulation. It also can be a side effect of certain medicines or because of an infection. It often happens to women during pregnancy. Sometimes, the cause is not known. Follow these instructions at home: Managing pain, stiffness, and swelling  Raise (elevate) your legs while you are sitting or lying down.  Move around often to prevent stiffness and to lessen swelling.  Do not sit or stand for long periods of time.  Wear support stockings as told by your health care provider.   Medicines  Take over-the-counter and prescription medicines only as told by your health care provider.  Your health care provider may prescribe medicine to help your body get rid of excess water (diuretic). General instructions  Pay attention to any changes in your symptoms.  Follow instructions from your health care provider about limiting salt (sodium) in your diet. Sometimes, eating less salt may reduce swelling.  Moisturize skin daily to help prevent skin from cracking and draining.  Keep all follow-up visits as told by your health care provider. This is important. Contact a health care provider if you have:  A fever.  Edema that starts suddenly or is getting worse, especially if you are pregnant or have a medical condition.  Swelling in only one leg.  Increased  swelling, redness, or pain in one or both of your legs.  Drainage or sores at the area where you have edema. Get help right away if you:  Develop shortness of breath, especially when you are lying down.  Have pain in your chest or abdomen.  Feel weak.  Feel faint. Summary  Peripheral edema is swelling that is caused by a buildup of fluid. Peripheral edema most often affects the lower legs, ankles, and feet.  Move around often to prevent stiffness and to lessen swelling. Do not sit or stand for long periods of time.  Pay attention to any changes in your symptoms.  Contact a health care provider if you have edema that starts suddenly or is getting worse, especially if you are pregnant or have a medical condition.  Get help right away if you develop shortness of breath, especially when lying down. This information is not intended to replace advice given to you by your health care provider. Make sure you discuss any questions you have with your health care provider. Document Revised: 05/14/2018 Document Reviewed: 05/14/2018 Elsevier Patient Education  2021 [employer].

## 2021-01-06 NOTE — Progress Notes (Signed)
Subjective:    Patient ID: Ricardo Folks Sr., male    DOB: 20-Oct-1955, 65 y.o.   MRN: [id number]  HPI  Patient presents the clinic today for follow-up of lower extremity edema.  He was seen by Dr. Parks Ranger 3 weeks ago for the same.  His Furosemide was increased from 10 mg to 40 mg daily for 2 weeks.  He reports swelling improved on the higher dose of Furosemide but as soon as he dropped down to 20 mg daily the swelling came right back.  He does report pain in his left lower extremity secondary to PAD.  He has had a bypass with stent placements on the right and reports he likely needs a bypass with stent placement on the left.  He does have numbness and tingling in his bilateral lower extremities, managed on gabapentin.  He is taking Atorvastatin, Eliquis as prescribed.  He has a follow-up with vascular in the next week or 2.  Echo from 09/2020 reviewed in [place].  Review of Systems      Past Medical History:  Diagnosis Date  . Arthritis    Rheumatoid  . Bipolar disorder ([place])   . Complication of anesthesia    difficulty keeping patient asleep. fentanyl makes patient mean, grumpy and difficult  . Depression   . Fusion of spine   . GERD (gastroesophageal reflux disease)    slightly uncomfortable. takes otc antacids occasionally  . Headache    deteriorating discs causing headaches. uses advil  . Hepatitis C    HEP "C". treated 2 years ago  . History of kidney stones 2016  . Hyperlipidemia   . Pericarditis    20 years ago  . Peripheral vascular disease ([place])   . Smoker     Current Outpatient Medications  Medication Sig Dispense Refill  . apixaban (ELIQUIS) 5 MG TABS tablet Take 1 tablet (5 mg total) by mouth 2 (two) times daily. 180 tablet 1  . atorvastatin (LIPITOR) 80 MG tablet Take 1 tablet (80 mg total) by mouth daily. 90 tablet 3  . digoxin (LANOXIN) 0.25 MG tablet Take 1 tablet (0.25 mg total) by mouth daily. 90 tablet 1  . DULoxetine (CYMBALTA) 60 MG capsule  Take 1 capsule (60 mg total) by mouth daily. 90 capsule 1  . empagliflozin (JARDIANCE) 10 MG TABS tablet Take 1 tablet (10 mg total) by mouth daily before breakfast. 90 tablet 1  . Fluticasone-Umeclidin-Vilant (TRELEGY ELLIPTA) 100-62.5-25 MCG/INH AEPB Inhale 1 application into the lungs daily.    . furosemide (LASIX) 20 MG tablet Take 2 tablets (40 mg total) by mouth daily. 90 tablet 0  . gabapentin (NEURONTIN) 400 MG capsule Take 1 capsule (400 mg total) by mouth 3 (three) times daily. 90 capsule 1  . metoprolol succinate (TOPROL-XL) 25 MG 24 hr tablet Take 2 tablets (50 mg total) by mouth daily. 180 tablet 3  . omeprazole (PRILOSEC) 20 MG capsule Take 1 capsule (20 mg total) by mouth daily. 90 capsule 1  . potassium chloride (KLOR-CON) 10 MEQ tablet Take 1 tablet (10 mEq total) by mouth daily. 90 tablet 0  . sildenafil (REVATIO) 20 MG tablet TAKE 3-5 PILLS AS NEEDED PRIOR TO SEX (Patient not taking: No sig reported) 30 tablet 1   No current facility-administered medications for this visit.    Allergies  Allergen Reactions  . Chantix [Varenicline] Other (See Comments)    Depression    Family History  Problem Relation Age of Onset  . Cancer  Mother        liver  . Heart disease Father   . Diabetes Father   . Breast cancer Sister   . Brain cancer Sister     Social History   Socioeconomic History  . Marital status: Single    Spouse name: Not on file  . Number of children: Not on file  . Years of education: Not on file  . Highest education level: Not on file  Occupational History  . Not on file  Tobacco Use  . Smoking status: Current Every Day Smoker    Packs/day: 0.50    Years: 40.00    Pack years: 20.00    Types: Cigarettes  . Smokeless tobacco: Never Used  Vaping Use  . Vaping Use: Former  Substance and Sexual Activity  . Alcohol use: Yes    Alcohol/week: 2.0 standard drinks    Types: 2 Shots of liquor per week    Comment: occasional (every 3-4 weeks)   . Drug  use: No    Comment: polysubstance approx 2 years ago  . Sexual activity: Not Currently  Other Topics Concern  . Not on file  Social History Narrative  . Not on file   Social Determinants of Health   Financial Resource Strain: Not on file  Food Insecurity: Not on file  Transportation Needs: Not on file  Physical Activity: Not on file  Stress: Not on file  Social Connections: Not on file  Intimate Partner Violence: Not on file     Constitutional: Denies fever, malaise, fatigue, headache or abrupt weight changes.  Respiratory: Denies difficulty breathing, shortness of breath, cough or sputum production.   Cardiovascular: Patient reports swelling of BLE.  Denies chest pain, chest tightness, palpitations or swelling in the hands.  Musculoskeletal: Patient reports pain in LLE with ambulatio, difficulty with gait.  Denies decrease in range of motion, muscle pain or joint pain and swelling.  Skin: Patient reports blue discoloration to fourth and fifth toes of his left foot.  Denies redness, rashes, lesions or ulcercations.  Neurological: Patient reports neuropathic pain in bilateral lower extremities.  Denies dizziness, difficulty with memory, difficulty with speech or problems with balance and coordination.    No other specific complaints in a complete review of systems (except as listed in HPI above).  Objective:   Physical Exam  BP 118/73 (BP Location: Left Arm, Patient Position: Sitting, Cuff Size: Normal)   Pulse 68   Temp 97.8 F (36.6 C) (Temporal)   Resp 18   Ht 6\' 2"  (1.88 m)   Wt 140 lb 12.8 oz (63.9 kg)   SpO2 100%   BMI 18.08 kg/m   Wt Readings from Last 3 Encounters:  12/15/20 137 lb (62.1 kg)  11/10/20 136 lb (61.7 kg)  12/18/17 169 lb 6.4 oz (76.8 kg)    General: Appears his stated age, chronically ill-appearing, in NAD. Skin: Warm, dry and intact.  LLE cool to touch.  Bluish discoloration noted of fourth and fifth toes on his left foot. HEENT: Head: normal  shape and size; Eyes: sclera whiteand EOMs intact;  Cardiovascular: Normal rate and rhythm.  1+ pitting BLE edema noted.  Pedal pulses 1+ bilaterally.   Pulmonary/Chest: Normal effort and positive vesicular breath sounds. No respiratory distress. No wheezes, rales or ronchi noted.  Musculoskeletal: Limping gait, using cane for assistance. Neurological: Alert and oriented.  Sensation decreased to BLE.   BMET    Component Value Date/Time   NA 137 11/14/2020 1409  K 4.3 11/14/2020 1409   CL 98 11/14/2020 1409   CO2 21 11/14/2020 1409   GLUCOSE 102 (H) 11/14/2020 1409   GLUCOSE 110 (H) 12/18/2017 1137   BUN 15 11/14/2020 1409   CREATININE 0.67 (L) 11/14/2020 1409   CREATININE 0.87 12/18/2017 1137   CALCIUM 10.0 11/14/2020 1409   GFRNONAA 94 12/18/2017 1137   GFRAA 109 12/18/2017 1137    Lipid Panel     Component Value Date/Time   CHOL 217 (H) 12/23/2015 1606   TRIG 248 (H) 12/23/2015 1606   HDL 29 (L) 12/23/2015 1606   CHOLHDL 7.5 (H) 12/23/2015 1606   LDLCALC 138 (H) 12/23/2015 1606    CBC    Component Value Date/Time   WBC 7.0 11/14/2020 1409   WBC 5.3 12/18/2017 1137   RBC 4.20 11/14/2020 1409   RBC 4.67 12/18/2017 1137   HGB 12.9 (L) 11/14/2020 1409   HCT 40.0 11/14/2020 1409   PLT 287 11/14/2020 1409   MCV 95 11/14/2020 1409   MCH 30.7 11/14/2020 1409   MCH 32.3 12/18/2017 1137   MCHC 32.3 11/14/2020 1409   MCHC 34.2 12/18/2017 1137   RDW 14.4 11/14/2020 1409   LYMPHSABS 0.9 11/14/2020 1409   MONOABS 0.4 06/13/2017 1502   EOSABS 0.2 11/14/2020 1409   BASOSABS 0.0 11/14/2020 1409    Hgb A1C No results found for: HGBA1C          Assessment & Plan:    Webb Silversmith, NP This visit occurred during the SARS-CoV-2 public health emergency.  Safety protocols were in place, including screening questions prior to the visit, additional usage of staff PPE, and extensive cleaning of exam room while observing appropriate contact time as indicated for  disinfecting solutions.

## 2021-01-06 NOTE — Telephone Encounter (Signed)
Copied from [place] [id number]. Topic: Medicare AWV >> Jan 06, 2021  1:50 PM Cher Nakai R wrote: Reason for CRM:  Left message for patient to call back and schedule the Medicare Annual Wellness Visit (AWV) virtually or by telephone.  Last AWV 03/12/2017  Please schedule at anytime with [facility].  40 minute appointment  Any questions, please call me at [phone]

## 2021-01-07 ENCOUNTER — Encounter: Payer: Self-pay | Admitting: Internal Medicine

## 2021-01-07 NOTE — Assessment & Plan Note (Signed)
Persistent lower extremity edema He does not want to increase Furosemide to 40 mg daily due to urinary frequency Rx for TED hose given Reinforced DASH diet, elevation and daily weights Will monitor symptoms

## 2021-01-07 NOTE — Assessment & Plan Note (Signed)
He likely needs bypass and stent placement in the left Continue Metoprolol, Atorvastatin and Eliquis He is requesting pain medication today, advised him I could not provide him with this He will follow-up with vascular in the next week or 2

## 2021-01-09 DIAGNOSIS — I69398 Other sequelae of cerebral infarction: Secondary | ICD-10-CM | POA: Diagnosis not present

## 2021-01-09 DIAGNOSIS — I70201 Unspecified atherosclerosis of native arteries of extremities, right leg: Secondary | ICD-10-CM | POA: Diagnosis not present

## 2021-01-09 DIAGNOSIS — M6281 Muscle weakness (generalized): Secondary | ICD-10-CM | POA: Diagnosis not present

## 2021-01-09 DIAGNOSIS — E440 Moderate protein-calorie malnutrition: Secondary | ICD-10-CM | POA: Diagnosis not present

## 2021-01-09 DIAGNOSIS — I69392 Facial weakness following cerebral infarction: Secondary | ICD-10-CM | POA: Diagnosis not present

## 2021-01-09 DIAGNOSIS — J439 Emphysema, unspecified: Secondary | ICD-10-CM | POA: Diagnosis not present

## 2021-01-09 DIAGNOSIS — R911 Solitary pulmonary nodule: Secondary | ICD-10-CM | POA: Diagnosis not present

## 2021-01-09 DIAGNOSIS — I5022 Chronic systolic (congestive) heart failure: Secondary | ICD-10-CM | POA: Diagnosis not present

## 2021-01-09 DIAGNOSIS — I69322 Dysarthria following cerebral infarction: Secondary | ICD-10-CM | POA: Diagnosis not present

## 2021-01-12 DIAGNOSIS — R911 Solitary pulmonary nodule: Secondary | ICD-10-CM | POA: Diagnosis not present

## 2021-01-12 DIAGNOSIS — I70201 Unspecified atherosclerosis of native arteries of extremities, right leg: Secondary | ICD-10-CM | POA: Diagnosis not present

## 2021-01-12 DIAGNOSIS — E440 Moderate protein-calorie malnutrition: Secondary | ICD-10-CM | POA: Diagnosis not present

## 2021-01-12 DIAGNOSIS — J439 Emphysema, unspecified: Secondary | ICD-10-CM | POA: Diagnosis not present

## 2021-01-12 DIAGNOSIS — M6281 Muscle weakness (generalized): Secondary | ICD-10-CM | POA: Diagnosis not present

## 2021-01-12 DIAGNOSIS — I69398 Other sequelae of cerebral infarction: Secondary | ICD-10-CM | POA: Diagnosis not present

## 2021-01-12 DIAGNOSIS — I69322 Dysarthria following cerebral infarction: Secondary | ICD-10-CM | POA: Diagnosis not present

## 2021-01-12 DIAGNOSIS — I5022 Chronic systolic (congestive) heart failure: Secondary | ICD-10-CM | POA: Diagnosis not present

## 2021-01-12 DIAGNOSIS — I69392 Facial weakness following cerebral infarction: Secondary | ICD-10-CM | POA: Diagnosis not present

## 2021-01-13 DIAGNOSIS — I480 Paroxysmal atrial fibrillation: Secondary | ICD-10-CM | POA: Diagnosis not present

## 2021-01-13 DIAGNOSIS — I739 Peripheral vascular disease, unspecified: Secondary | ICD-10-CM | POA: Diagnosis not present

## 2021-01-13 DIAGNOSIS — J410 Simple chronic bronchitis: Secondary | ICD-10-CM | POA: Diagnosis not present

## 2021-01-13 DIAGNOSIS — I959 Hypotension, unspecified: Secondary | ICD-10-CM | POA: Diagnosis not present

## 2021-01-13 DIAGNOSIS — E7800 Pure hypercholesterolemia, unspecified: Secondary | ICD-10-CM | POA: Diagnosis not present

## 2021-01-13 DIAGNOSIS — I5020 Unspecified systolic (congestive) heart failure: Secondary | ICD-10-CM | POA: Diagnosis not present

## 2021-01-13 DIAGNOSIS — I77810 Thoracic aortic ectasia: Secondary | ICD-10-CM | POA: Diagnosis not present

## 2021-01-17 DIAGNOSIS — I69392 Facial weakness following cerebral infarction: Secondary | ICD-10-CM | POA: Diagnosis not present

## 2021-01-17 DIAGNOSIS — E440 Moderate protein-calorie malnutrition: Secondary | ICD-10-CM | POA: Diagnosis not present

## 2021-01-17 DIAGNOSIS — J439 Emphysema, unspecified: Secondary | ICD-10-CM | POA: Diagnosis not present

## 2021-01-17 DIAGNOSIS — M6281 Muscle weakness (generalized): Secondary | ICD-10-CM | POA: Diagnosis not present

## 2021-01-17 DIAGNOSIS — I69322 Dysarthria following cerebral infarction: Secondary | ICD-10-CM | POA: Diagnosis not present

## 2021-01-17 DIAGNOSIS — I69398 Other sequelae of cerebral infarction: Secondary | ICD-10-CM | POA: Diagnosis not present

## 2021-01-17 DIAGNOSIS — I5022 Chronic systolic (congestive) heart failure: Secondary | ICD-10-CM | POA: Diagnosis not present

## 2021-01-17 DIAGNOSIS — I70201 Unspecified atherosclerosis of native arteries of extremities, right leg: Secondary | ICD-10-CM | POA: Diagnosis not present

## 2021-01-17 DIAGNOSIS — R911 Solitary pulmonary nodule: Secondary | ICD-10-CM | POA: Diagnosis not present

## 2021-01-19 DIAGNOSIS — I5022 Chronic systolic (congestive) heart failure: Secondary | ICD-10-CM | POA: Diagnosis not present

## 2021-01-19 DIAGNOSIS — I69322 Dysarthria following cerebral infarction: Secondary | ICD-10-CM | POA: Diagnosis not present

## 2021-01-19 DIAGNOSIS — I70201 Unspecified atherosclerosis of native arteries of extremities, right leg: Secondary | ICD-10-CM | POA: Diagnosis not present

## 2021-01-19 DIAGNOSIS — I69398 Other sequelae of cerebral infarction: Secondary | ICD-10-CM | POA: Diagnosis not present

## 2021-01-19 DIAGNOSIS — M6281 Muscle weakness (generalized): Secondary | ICD-10-CM | POA: Diagnosis not present

## 2021-01-19 DIAGNOSIS — E440 Moderate protein-calorie malnutrition: Secondary | ICD-10-CM | POA: Diagnosis not present

## 2021-01-19 DIAGNOSIS — R911 Solitary pulmonary nodule: Secondary | ICD-10-CM | POA: Diagnosis not present

## 2021-01-19 DIAGNOSIS — I69392 Facial weakness following cerebral infarction: Secondary | ICD-10-CM | POA: Diagnosis not present

## 2021-01-19 DIAGNOSIS — J439 Emphysema, unspecified: Secondary | ICD-10-CM | POA: Diagnosis not present

## 2021-01-25 DIAGNOSIS — I714 Abdominal aortic aneurysm, without rupture: Secondary | ICD-10-CM | POA: Diagnosis not present

## 2021-01-25 DIAGNOSIS — I739 Peripheral vascular disease, unspecified: Secondary | ICD-10-CM | POA: Diagnosis not present

## 2021-02-06 DIAGNOSIS — I714 Abdominal aortic aneurysm, without rupture: Secondary | ICD-10-CM | POA: Diagnosis not present

## 2021-02-06 DIAGNOSIS — I739 Peripheral vascular disease, unspecified: Secondary | ICD-10-CM | POA: Diagnosis not present

## 2021-02-06 DIAGNOSIS — I5020 Unspecified systolic (congestive) heart failure: Secondary | ICD-10-CM | POA: Diagnosis not present

## 2021-02-08 ENCOUNTER — Other Ambulatory Visit: Payer: Self-pay | Admitting: Unknown Physician Specialty

## 2021-02-09 DIAGNOSIS — J984 Other disorders of lung: Secondary | ICD-10-CM | POA: Diagnosis not present

## 2021-02-09 DIAGNOSIS — Z87898 Personal history of other specified conditions: Secondary | ICD-10-CM | POA: Diagnosis not present

## 2021-02-09 DIAGNOSIS — I6930 Unspecified sequelae of cerebral infarction: Secondary | ICD-10-CM | POA: Diagnosis not present

## 2021-02-09 DIAGNOSIS — E7800 Pure hypercholesterolemia, unspecified: Secondary | ICD-10-CM | POA: Diagnosis not present

## 2021-02-09 DIAGNOSIS — I480 Paroxysmal atrial fibrillation: Secondary | ICD-10-CM | POA: Diagnosis not present

## 2021-02-09 DIAGNOSIS — Z01818 Encounter for other preprocedural examination: Secondary | ICD-10-CM | POA: Diagnosis not present

## 2021-02-09 DIAGNOSIS — I5020 Unspecified systolic (congestive) heart failure: Secondary | ICD-10-CM | POA: Diagnosis not present

## 2021-02-09 DIAGNOSIS — J410 Simple chronic bronchitis: Secondary | ICD-10-CM | POA: Diagnosis not present

## 2021-02-09 DIAGNOSIS — A319 Mycobacterial infection, unspecified: Secondary | ICD-10-CM | POA: Diagnosis not present

## 2021-02-09 DIAGNOSIS — I77810 Thoracic aortic ectasia: Secondary | ICD-10-CM | POA: Diagnosis not present

## 2021-02-09 DIAGNOSIS — I739 Peripheral vascular disease, unspecified: Secondary | ICD-10-CM | POA: Diagnosis not present

## 2021-02-09 DIAGNOSIS — R768 Other specified abnormal immunological findings in serum: Secondary | ICD-10-CM | POA: Diagnosis not present

## 2021-02-22 DIAGNOSIS — Z20822 Contact with and (suspected) exposure to covid-19: Secondary | ICD-10-CM | POA: Diagnosis not present

## 2021-02-24 DIAGNOSIS — J449 Chronic obstructive pulmonary disease, unspecified: Secondary | ICD-10-CM | POA: Diagnosis not present

## 2021-02-24 DIAGNOSIS — I4891 Unspecified atrial fibrillation: Secondary | ICD-10-CM | POA: Diagnosis not present

## 2021-02-24 DIAGNOSIS — Z7982 Long term (current) use of aspirin: Secondary | ICD-10-CM | POA: Diagnosis not present

## 2021-02-24 DIAGNOSIS — Z95828 Presence of other vascular implants and grafts: Secondary | ICD-10-CM | POA: Diagnosis not present

## 2021-02-24 DIAGNOSIS — I714 Abdominal aortic aneurysm, without rupture: Secondary | ICD-10-CM | POA: Diagnosis not present

## 2021-02-24 DIAGNOSIS — F39 Unspecified mood [affective] disorder: Secondary | ICD-10-CM | POA: Diagnosis not present

## 2021-02-24 DIAGNOSIS — Z48812 Encounter for surgical aftercare following surgery on the circulatory system: Secondary | ICD-10-CM | POA: Diagnosis not present

## 2021-02-24 DIAGNOSIS — Z8673 Personal history of transient ischemic attack (TIA), and cerebral infarction without residual deficits: Secondary | ICD-10-CM | POA: Diagnosis not present

## 2021-02-24 DIAGNOSIS — I739 Peripheral vascular disease, unspecified: Secondary | ICD-10-CM | POA: Diagnosis not present

## 2021-02-24 DIAGNOSIS — I719 Aortic aneurysm of unspecified site, without rupture: Secondary | ICD-10-CM | POA: Diagnosis not present

## 2021-02-24 DIAGNOSIS — F17210 Nicotine dependence, cigarettes, uncomplicated: Secondary | ICD-10-CM | POA: Diagnosis not present

## 2021-02-24 DIAGNOSIS — I70222 Atherosclerosis of native arteries of extremities with rest pain, left leg: Secondary | ICD-10-CM | POA: Diagnosis not present

## 2021-02-24 DIAGNOSIS — I509 Heart failure, unspecified: Secondary | ICD-10-CM | POA: Diagnosis not present

## 2021-02-24 DIAGNOSIS — Z7901 Long term (current) use of anticoagulants: Secondary | ICD-10-CM | POA: Diagnosis not present

## 2021-02-24 DIAGNOSIS — I70202 Unspecified atherosclerosis of native arteries of extremities, left leg: Secondary | ICD-10-CM | POA: Diagnosis not present

## 2021-02-24 DIAGNOSIS — I110 Hypertensive heart disease with heart failure: Secondary | ICD-10-CM | POA: Diagnosis not present

## 2021-03-13 DIAGNOSIS — Z95828 Presence of other vascular implants and grafts: Secondary | ICD-10-CM | POA: Diagnosis not present

## 2021-03-31 DIAGNOSIS — Z95828 Presence of other vascular implants and grafts: Secondary | ICD-10-CM | POA: Diagnosis not present

## 2021-03-31 DIAGNOSIS — Z48812 Encounter for surgical aftercare following surgery on the circulatory system: Secondary | ICD-10-CM | POA: Diagnosis not present

## 2021-04-03 DIAGNOSIS — Z95828 Presence of other vascular implants and grafts: Secondary | ICD-10-CM | POA: Diagnosis not present

## 2021-04-03 DIAGNOSIS — G8929 Other chronic pain: Secondary | ICD-10-CM | POA: Diagnosis not present

## 2021-04-03 DIAGNOSIS — M79672 Pain in left foot: Secondary | ICD-10-CM | POA: Diagnosis not present

## 2021-04-13 ENCOUNTER — Other Ambulatory Visit: Payer: Self-pay

## 2021-04-13 MED ORDER — GABAPENTIN 400 MG PO CAPS: 400.0000 mg | ORAL_CAPSULE | Freq: Three times a day (TID) | ORAL | 2 refills | Status: DC

## 2021-05-10 ENCOUNTER — Other Ambulatory Visit: Payer: Self-pay | Admitting: Unknown Physician Specialty

## 2021-05-10 DIAGNOSIS — Z5181 Encounter for therapeutic drug level monitoring: Secondary | ICD-10-CM

## 2021-05-10 DIAGNOSIS — I5022 Chronic systolic (congestive) heart failure: Secondary | ICD-10-CM

## 2021-05-10 NOTE — Telephone Encounter (Signed)
Requested medication (s) are due for refill today: Yes  Requested medication (s) are on the active medication list: Yes  Last refill:    Future visit scheduled: No  Notes to clinic:  Protocols indicate pt. Needs lab work.    Requested Prescriptions  Pending Prescriptions Disp Refills   ELIQUIS 5 MG TABS tablet [Pharmacy Med Name: ELIQUIS 5 MG TABLET] 180 tablet 1    Sig: TAKE ONE TABLET BY MOUTH TWICE A DAY     Hematology:  Anticoagulants Failed - 05/10/2021  6:17 AM      Failed - HGB in normal range and within 360 days    Hemoglobin  Date Value Ref Range Status  11/14/2020 12.9 (L) 13.0 - 17.7 g/dL Final          Failed - Cr in normal range and within 360 days    Creat  Date Value Ref Range Status  12/18/2017 0.87 0.70 - 1.25 mg/dL Final    Comment:    For patients >65 years of age, the reference limit for Creatinine is approximately 13% higher for people identified as African-American. .    Creatinine, Ser  Date Value Ref Range Status  11/14/2020 0.67 (L) 0.76 - 1.27 mg/dL Final          Passed - PLT in normal range and within 360 days    Platelets  Date Value Ref Range Status  11/14/2020 287 150 - 450 x10E3/uL Final          Passed - HCT in normal range and within 360 days    Hematocrit  Date Value Ref Range Status  11/14/2020 40.0 37.5 - 51.0 % Final          Passed - Valid encounter within last 12 months    Recent Outpatient Visits           4 months ago HFrEF (heart failure with reduced ejection fraction) ([place])   [facility], Coralie Keens, NP   4 months ago Bilateral lower extremity edema   [place], DO   5 months ago HFrEF (heart failure with reduced ejection fraction) [facility])   [place], [place]   6 months ago Chronic systolic heart failure [facility])   [facility] Kathrine Haddock, NP   3 years ago Cough   [facility]  Merrilyn Puma, Jerrel Ivory, NP               JARDIANCE 10 MG TABS tablet [Pharmacy Med Name: JARDIANCE 10 MG TABLET] 90 tablet 1    Sig: TAKE ONE TABLET BY [place]     Endocrinology:  Diabetes - SGLT2 Inhibitors Failed - 05/10/2021  6:17 AM      Failed - Cr in normal range and within 360 days    Creat  Date Value Ref Range Status  12/18/2017 0.87 0.70 - 1.25 mg/dL Final    Comment:    For patients >78 years of age, the reference limit for Creatinine is approximately 13% higher for people identified as African-American. .    Creatinine, Ser  Date Value Ref Range Status  11/14/2020 0.67 (L) 0.76 - 1.27 mg/dL Final          Failed - LDL in normal range and within 360 days    LDL Calculated  Date Value Ref Range Status  12/23/2015 138 (H) 0 - 99 mg/dL Final  Failed - HBA1C is between 0 and 7.9 and within 180 days    No results found for: HGBA1C, LABA1C        Passed - eGFR in normal range and within 360 days    GFR, Est African American  Date Value Ref Range Status  12/18/2017 109 > OR = 60 mL/min/1.64m Final   GFR, Est Non African American  Date Value Ref Range Status  12/18/2017 94 > OR = 60 mL/min/1.797mFinal   eGFR  Date Value Ref Range Status  11/14/2020 105 >59 mL/min/1.73 Final          Passed - Valid encounter within last 6 months    Recent Outpatient Visits           4 months ago HFrEF (heart failure with reduced ejection fraction) ([place]  [facility]   4 months ago Bilateral lower extremity edema   SoMosineeDO   5 months ago [facility] with reduced ejection fraction) ([facility]  [place] 6 months ago [facility] systolic [facility] ([facility]  [facility]add[facility]   3 years ago Cough   [facility] PumaLaJerrel IvoryNP               digoxin  (LANOXIN) 0.25 MG tablet [Pharmacy Med Name: DIGOXIN 0.25 MG TABLET] 90 tablet 1    Sig: TAKE ONE TABLET BY MOUTH DAILY     Cardiovascular:  Antiarrhythmic Agents - digoxin Failed - 05/10/2021  6:17 AM      Failed - Cr in normal range and within 180 days    Creat  Date Value Ref Range Status  12/18/2017 0.87 0.70 - 1.25 mg/dL Final    Comment:    For patients >4945ears of age, the reference limit for Creatinine is approximately 13% higher for people identified as African-American. .    Creatinine, Ser  Date Value Ref Range Status  11/14/2020 0.67 (L) 0.76 - 1.27 mg/dL Final          Failed - Digoxin (serum) in normal range and within 180 days    No results found for: DIGOXIN        Passed - Ca in normal range and within 180 days    Calcium  Date Value Ref Range Status  11/14/2020 10.0 8.6 - 10.2 mg/dL Final          Passed - K in normal range and within 180 days    Potassium  Date Value Ref Range Status  11/14/2020 4.3 3.5 - 5.2 mmol/L Final          Passed - Last Heart Rate in normal range    Pulse Readings from Last 1 Encounters:  01/06/21 68          Passed - Valid encounter within last 6 months    Recent Outpatient Visits           4 months ago [facility] reduced [facility]) ([facility]  [facility] KeensNP   4 months ago Bilateral lower extremity edema   SoBladesDO   5 months ago [facility] (heart [facility] with reduced ejection fraction) ([facility]  [place] 6 months ago Chronic systolic heart failure ([place]  [facility] Kathrine Haddock, NP   3 years ago Cough   [facility] Merrilyn Puma, Jerrel Ivory, NP              Signed Prescriptions Disp Refills   DULoxetine (CYMBALTA) 60 MG capsule 90 capsule 0    Sig: TAKE ONE CAPSULE BY MOUTH DAILY     Psychiatry: Antidepressants - SNRI Passed -  05/10/2021  6:17 AM      Passed - Last BP in normal range    BP Readings from Last 1 Encounters:  01/06/21 118/73          Passed - Valid encounter within last 6 months    Recent Outpatient Visits           4 months ago HFrEF (heart failure with reduced ejection fraction) ([place])   [facility], Coralie Keens, NP   4 months ago Bilateral lower extremity edema   [place], DO   5 months ago HFrEF (heart failure with reduced ejection fraction) [facility])   [place], [place]   6 months ago Chronic systolic heart failure [facility])   [facility] Kathrine Haddock, NP   3 years ago Cough   [facility] Merrilyn Puma, Jerrel Ivory, NP               omeprazole (PRILOSEC) 20 MG capsule 90 capsule 0    Sig: TAKE ONE CAPSULE BY MOUTH DAILY     Gastroenterology: Proton Pump Inhibitors Passed - 05/10/2021  6:17 AM      Passed - Valid encounter within last 12 months    Recent Outpatient Visits           4 months ago HFrEF (heart failure with reduced ejection fraction) [facility])   [facility] [place], Coralie Keens, NP   4 months ago Bilateral lower extremity edema   [place], DO   5 months ago HFrEF (heart failure with reduced ejection fraction) [facility])   [place], [place]   6 months ago Chronic systolic heart failure [facility])   [facility] Kathrine Haddock, NP   3 years ago Cough   [facility] Merrilyn Puma, Jerrel Ivory, NP

## 2021-07-18 ENCOUNTER — Telehealth: Payer: Self-pay

## 2021-07-18 NOTE — Telephone Encounter (Signed)
They need to schedule an appt to discuss

## 2021-07-18 NOTE — Telephone Encounter (Signed)
I called and left a message on the patient son voicemail that the patient needs to schedule an appt to address his concern.

## 2021-07-18 NOTE — Telephone Encounter (Signed)
Copied from [place] [id number]. Topic: General - Other >> Jul 18, 2021  9:55 AM Valere Dross wrote: Reason for CRM: Pts son called in stating father was wanting to try Viagra but due to his heart and vascular issues that he has, pts son was a little concerned and wanted to speak with PCP about that, as well as pt wants to fly internationally and wanted to see about getting him clear to fly, please advise.

## 2021-07-31 ENCOUNTER — Ambulatory Visit (INDEPENDENT_AMBULATORY_CARE_PROVIDER_SITE_OTHER): Payer: Medicare Other | Admitting: Internal Medicine

## 2021-07-31 ENCOUNTER — Encounter: Payer: Self-pay | Admitting: Internal Medicine

## 2021-07-31 ENCOUNTER — Other Ambulatory Visit: Payer: Self-pay

## 2021-07-31 VITALS — BP 121/74 | HR 73 | Temp 97.7°F | Resp 18 | Ht 74.0 in | Wt 151.0 lb

## 2021-07-31 DIAGNOSIS — I482 Chronic atrial fibrillation, unspecified: Secondary | ICD-10-CM

## 2021-07-31 DIAGNOSIS — E119 Type 2 diabetes mellitus without complications: Secondary | ICD-10-CM | POA: Insufficient documentation

## 2021-07-31 DIAGNOSIS — N529 Male erectile dysfunction, unspecified: Secondary | ICD-10-CM

## 2021-07-31 DIAGNOSIS — E118 Type 2 diabetes mellitus with unspecified complications: Secondary | ICD-10-CM | POA: Insufficient documentation

## 2021-07-31 DIAGNOSIS — E782 Mixed hyperlipidemia: Secondary | ICD-10-CM | POA: Diagnosis not present

## 2021-07-31 DIAGNOSIS — I5020 Unspecified systolic (congestive) heart failure: Secondary | ICD-10-CM

## 2021-07-31 DIAGNOSIS — J449 Chronic obstructive pulmonary disease, unspecified: Secondary | ICD-10-CM

## 2021-07-31 DIAGNOSIS — I6930 Unspecified sequelae of cerebral infarction: Secondary | ICD-10-CM | POA: Diagnosis not present

## 2021-07-31 DIAGNOSIS — I70213 Atherosclerosis of native arteries of extremities with intermittent claudication, bilateral legs: Secondary | ICD-10-CM

## 2021-07-31 DIAGNOSIS — K219 Gastro-esophageal reflux disease without esophagitis: Secondary | ICD-10-CM | POA: Diagnosis not present

## 2021-07-31 LAB — COMPLETE METABOLIC PANEL WITH GFR
AG Ratio: 1.5 (calc) (ref 1.0–2.5)
ALT: 26 U/L (ref 9–46)
AST: 21 U/L (ref 10–35)
Albumin: 4.3 g/dL (ref 3.6–5.1)
Alkaline phosphatase (APISO): 137 U/L (ref 35–144)
BUN/Creatinine Ratio: 39 (calc) — ABNORMAL HIGH (ref 6–22)
BUN: 26 mg/dL — ABNORMAL HIGH (ref 7–25)
CO2: 29 mmol/L (ref 20–32)
Calcium: 9.4 mg/dL (ref 8.6–10.3)
Chloride: 101 mmol/L (ref 98–110)
Creat: 0.66 mg/dL — ABNORMAL LOW (ref 0.70–1.35)
Globulin: 2.9 g/dL (calc) (ref 1.9–3.7)
Glucose, Bld: 92 mg/dL (ref 65–139)
Potassium: 4.6 mmol/L (ref 3.5–5.3)
Sodium: 138 mmol/L (ref 135–146)
Total Bilirubin: 0.7 mg/dL (ref 0.2–1.2)
Total Protein: 7.2 g/dL (ref 6.1–8.1)
eGFR: 105 mL/min/{1.73_m2} (ref >=60)

## 2021-07-31 LAB — LIPID PANEL
Cholesterol: 147 mg/dL (ref <200)
HDL: 42 mg/dL (ref >=40)
LDL Cholesterol (Calc): 91 mg/dL (calc)
Non-HDL Cholesterol (Calc): 105 mg/dL (calc) (ref <130)
Total CHOL/HDL Ratio: 3.5 (calc) (ref <5.0)
Triglycerides: 62 mg/dL (ref <150)

## 2021-07-31 LAB — CBC
HCT: 45.9 % (ref 38.5–50.0)
Hemoglobin: 15.2 g/dL (ref 13.2–17.1)
MCH: 32.2 pg (ref 27.0–33.0)
MCHC: 33.1 g/dL (ref 32.0–36.0)
MCV: 97.2 fL (ref 80.0–100.0)
MPV: 11.1 fL (ref 7.5–12.5)
Platelets: 220 10*3/uL (ref 140–400)
RBC: 4.72 10*6/uL (ref 4.20–5.80)
RDW: 13.6 % (ref 11.0–15.0)
WBC: 9.3 10*3/uL (ref 3.8–10.8)

## 2021-07-31 MED ORDER — SILDENAFIL CITRATE 50 MG PO TABS: 50.0000 mg | ORAL_TABLET | Freq: Every day | ORAL | 0 refills | Status: DC | PRN

## 2021-07-31 NOTE — Assessment & Plan Note (Signed)
No residual effects Continue Metoprolol, Epixaban and Atorvastatin

## 2021-07-31 NOTE — Assessment & Plan Note (Signed)
Continue Trelegy Encouraged smoking cessation

## 2021-07-31 NOTE — Assessment & Plan Note (Signed)
Continue Atorvastatin Encouraged smoking cessation

## 2021-07-31 NOTE — Progress Notes (Signed)
Subjective:    Patient ID: Ricardo Folks Sr., male    DOB: [birth year], 65 y.o.   MRN: [id number]  HPI  Pt presents to the clinic today for a general check. He has an international flight coming up and would like to know if he is cleared to fly. He has a history of Afib, CHF and PAD. There is no echocardiogram on file.  He would also like a refill of his Sildenafil today.  Review of Systems  Past Medical History:  Diagnosis Date   Arthritis    Rheumatoid   Bipolar disorder ([place])    Complication of anesthesia    difficulty keeping patient asleep. fentanyl makes patient mean, grumpy and difficult   Depression    Fusion of spine    GERD (gastroesophageal reflux disease)    slightly uncomfortable. takes otc antacids occasionally   Headache    deteriorating discs causing headaches. uses advil   Hepatitis C    HEP "C". treated 2 years ago   History of kidney stones 2016   Hyperlipidemia    Pericarditis    20 years ago   Peripheral vascular disease ([place])    Smoker     Current Outpatient Medications  Medication Sig Dispense Refill   albuterol (VENTOLIN HFA) 108 (90 Base) MCG/ACT inhaler      atorvastatin (LIPITOR) 80 MG tablet Take 1 tablet (80 mg total) by mouth daily. 90 tablet 3   digoxin (LANOXIN) 0.25 MG tablet TAKE ONE TABLET BY MOUTH DAILY 90 tablet 1   DULoxetine (CYMBALTA) 60 MG capsule TAKE ONE CAPSULE BY MOUTH DAILY 90 capsule 0   ELIQUIS 5 MG TABS tablet TAKE ONE TABLET BY MOUTH TWICE A DAY 180 tablet 1   Fluticasone-Umeclidin-Vilant (TRELEGY ELLIPTA) 100-62.5-25 MCG/INH AEPB Inhale 1 application into the lungs daily.     furosemide (LASIX) 20 MG tablet Take 2 tablets (40 mg total) by mouth daily. 90 tablet 0   gabapentin (NEURONTIN) 400 MG capsule Take 1 capsule (400 mg total) by mouth 3 (three) times daily. 90 capsule 2   JARDIANCE 10 MG TABS tablet TAKE ONE TABLET BY MOUTH DAILY BEFORE BREAKFAST 90 tablet 1   metoprolol succinate (TOPROL-XL) 25 MG 24 hr tablet  Take 2 tablets (50 mg total) by mouth daily. 180 tablet 3   omeprazole (PRILOSEC) 20 MG capsule TAKE ONE CAPSULE BY MOUTH DAILY 90 capsule 0   potassium chloride (KLOR-CON) 10 MEQ tablet Take 1 tablet (10 mEq total) by mouth daily. 90 tablet 0   sildenafil (REVATIO) 20 MG tablet TAKE 3-5 PILLS AS NEEDED PRIOR TO SEX (Patient not taking: Reported on 01/06/2021) 30 tablet 1   No current facility-administered medications for this visit.    Allergies  Allergen Reactions   Chantix [Varenicline] Other (See Comments)    Depression    Family History  Problem Relation Age of Onset   Cancer Mother        liver   Heart disease Father    Diabetes Father    Breast cancer Sister    Brain cancer Sister     Social History   Socioeconomic History   Marital status: Single    Spouse name: Not on file   Number of children: Not on file   Years of education: Not on file   Highest education level: Not on file  Occupational History   Not on file  Tobacco Use   Smoking status: Every Day    Packs/day: 0.50  Years: 40.00    Pack years: 20.00    Types: Cigarettes   Smokeless tobacco: Never  Vaping Use   Vaping Use: Former  Substance and Sexual Activity   Alcohol use: Yes    Alcohol/week: 2.0 standard drinks    Types: 2 Shots of liquor per week    Comment: occasional (every 3-4 weeks)    Drug use: No    Comment: polysubstance approx 2 years ago   Sexual activity: Not Currently  Other Topics Concern   Not on file  Social History Narrative   Not on file   Social Determinants of Health   Financial Resource Strain: Not on file  Food Insecurity: Not on file  Transportation Needs: Not on file  Physical Activity: Not on file  Stress: Not on file  Social Connections: Not on file  Intimate Partner Violence: Not on file     Constitutional: Denies fever, malaise, fatigue, headache or abrupt weight changes.  HEENT: Denies eye pain, eye redness, ear pain, ringing in the ears, wax buildup,  runny nose, nasal congestion, bloody nose, or sore throat. Respiratory: Pt reports intermittent shortness of breath with exertion. Denies difficulty breathing, shortness of breath, cough or sputum production.   Cardiovascular: Denies chest pain, chest tightness, palpitations or swelling in the hands or feet.  Gastrointestinal: Denies abdominal pain, bloating, constipation, diarrhea or blood in the stool.  GU: Pt reports erectile dysfunction. Denies urgency, frequency, pain with urination, burning sensation, blood in urine, odor or discharge. Musculoskeletal: Denies decrease in range of motion, muscle pain or joint pain and swelling.  Skin: Denies redness, rashes, lesions or ulcercations.  Neurological: Denies dizziness, difficulty with memory, difficulty with speech or problems with balance and coordination.  Psych: Denies anxiety, depression, SI/HI.  No other specific complaints in a complete review of systems (except as listed in HPI above).     Objective:   Physical Exam   BP 121/74 (BP Location: Right Arm, Patient Position: Sitting, Cuff Size: Normal)   Pulse 73   Temp 97.7 F (36.5 C) (Temporal)   Resp 18   Ht 6\' 2"  (1.88 m)   Wt 151 lb (68.5 kg)   SpO2 100%   BMI 19.39 kg/m    Wt Readings from Last 3 Encounters:  01/06/21 140 lb 12.8 oz (63.9 kg)  12/15/20 137 lb (62.1 kg)  11/10/20 136 lb (61.7 kg)    General: Appears his stated age, well developed, well nourished in NAD. Skin: Warm, dry and intact.  Cardiovascular: Normal rate and rhythm. S1,S2 noted.  No murmur, rubs or gallops noted. No JVD or BLE edema. No carotid bruits noted. Pulmonary/Chest: Normal effort and positive vesicular breath sounds. No respiratory distress. No wheezes, rales or ronchi noted.  Musculoskeletal: Gait slow and steady with use of cane. Neurological: Alert and oriented. Psychiatric: Mood and affect normal. Behavior is normal. Judgment and thought content normal.    BMET    Component  Value Date/Time   NA 137 11/14/2020 1409   K 4.3 11/14/2020 1409   CL 98 11/14/2020 1409   CO2 21 11/14/2020 1409   GLUCOSE 102 (H) 11/14/2020 1409   GLUCOSE 110 (H) 12/18/2017 1137   BUN 15 11/14/2020 1409   CREATININE 0.67 (L) 11/14/2020 1409   CREATININE 0.87 12/18/2017 1137   CALCIUM 10.0 11/14/2020 1409   GFRNONAA 94 12/18/2017 1137   GFRAA 109 12/18/2017 1137    Lipid Panel     Component Value Date/Time   CHOL 217 (H) 12/23/2015  1606   TRIG 248 (H) 12/23/2015 1606   HDL 29 (L) 12/23/2015 1606   CHOLHDL 7.5 (H) 12/23/2015 1606   LDLCALC 138 (H) 12/23/2015 1606    CBC    Component Value Date/Time   WBC 7.0 11/14/2020 1409   WBC 5.3 12/18/2017 1137   RBC 4.20 11/14/2020 1409   RBC 4.67 12/18/2017 1137   HGB 12.9 (L) 11/14/2020 1409   HCT 40.0 11/14/2020 1409   PLT 287 11/14/2020 1409   MCV 95 11/14/2020 1409   MCH 30.7 11/14/2020 1409   MCH 32.3 12/18/2017 1137   MCHC 32.3 11/14/2020 1409   MCHC 34.2 12/18/2017 1137   RDW 14.4 11/14/2020 1409   LYMPHSABS 0.9 11/14/2020 1409   MONOABS 0.4 06/13/2017 1502   EOSABS 0.2 11/14/2020 1409   BASOSABS 0.0 11/14/2020 1409    Hgb A1C No results found for: HGBA1C         Assessment & Plan:     Webb Silversmith, NP This visit occurred during the SARS-CoV-2 public health emergency.  Safety protocols were in place, including screening questions prior to the visit, additional usage of staff PPE, and extensive cleaning of exam room while observing appropriate contact time as indicated for disinfecting solutions.

## 2021-07-31 NOTE — Patient Instructions (Signed)
COPD and Physical Activity Chronic obstructive pulmonary disease (COPD) is a long-term, or chronic, condition that affects the lungs. COPD is a general term that can be used to describe many problems that cause inflammation of the lungs and limit airflow. These conditions include chronic bronchitis and emphysema. The main symptom of COPD is shortness of breath, which makes it harder to do even simple tasks. This can also make it harder to exercise and stay active. Talk with your health care provider about treatments to help you breathe better and actions you can take to prevent breathing problems during physical activity. What are the benefits of exercising when you have COPD? Exercising regularly is an important part of a healthy lifestyle. You can still exercise and do physical activities even though you have COPD. Exercise and physical activity improve your shortness of breath by increasing blood flow (circulation). This causes your heart to pump more oxygen through your body. Moderate exercise can: Improve oxygen use. Increase your energy level. Help with shortness of breath. Strengthen your breathing muscles. Improve heart health. Help with sleep. Improve your self-esteem and feelings of self-worth. Lower depression, stress, and anxiety. Exercise can benefit everyone with COPD. The severity of your disease may affect how hard you can exercise, especially at first, but everyone can benefit. Talk with your health care provider about how much exercise is safe for you, and which activities and exercises are safe for you. What actions can I take to prevent breathing problems during physical activity? Sign up for a pulmonary rehabilitation program. This type of program may include: Education about lung diseases. Exercise classes that teach you how to exercise and be more active while improving your breathing. This usually involves: Exercise using your lower extremities, such as a stationary  bicycle. About 30 minutes of exercise, 2 to 5 times per week, for 6 to 12 weeks. Strength training, such as push-ups or leg lifts. Nutrition education. Group classes in which you can talk with others who also have COPD and learn ways to manage stress. If you use an oxygen tank, you should use it while you exercise. Work with your health care provider to adjust your oxygen for your physical activity. Your resting flow rate is different from your flow rate during physical activity. How to manage your breathing while exercising While you are exercising: Take slow breaths. Pace yourself, and do nottry to go too fast. Purse your lips while breathing out. Pursing your lips is similar to a kissing or whistling position. If doing exercise that uses a quick burst of effort, such as weight lifting: Breathe in before starting the exercise. Breathe out during the hardest part of the exercise, such as raising the weights. Where to find support You can find support for exercising with COPD from: Your health care provider. A pulmonary rehabilitation program. Your local health department or community health programs. Support groups, either online or in-person. Your health care provider may be able to recommend support groups. Where to find more information You can find more information about exercising with COPD from: American Lung Association: lung.org COPD Foundation: copdfoundation.org Contact a health care provider if: Your symptoms get worse. You have nausea. You have a fever. You want to start a new exercise program or a new activity. Get help right away if: You have chest pain. You cannot breathe. These symptoms may represent a serious problem that is an emergency. Do not wait to see if the symptoms will go away. Get medical help right away. Call   your local emergency services (911 in the [place] Do not drive yourself to the hospital. Summary COPD is a general term that can be used to describe  many different lung problems that cause lung inflammation and limit airflow. This includes chronic bronchitis and emphysema. Exercise and physical activity improve your shortness of breath by increasing blood flow (circulation). This causes your heart to provide more oxygen to your body. Contact your health care provider before starting any exercise program or new activity. Ask your health care provider what exercises and activities are safe for you. This information is not intended to replace advice given to you by your health care provider. Make sure you discuss any questions you have with your health care provider. Document Revised: 06/28/2020 Document Reviewed: 06/28/2020 Elsevier Patient Education  2022 Elsevier Inc.  

## 2021-07-31 NOTE — Assessment & Plan Note (Signed)
Continue Digoxin, Metoprolol and Epixiban

## 2021-07-31 NOTE — Assessment & Plan Note (Signed)
CMET and lipid profile today °Encouraged him to consume a low fat diet °Continue Atorvastatin °

## 2021-07-31 NOTE — Assessment & Plan Note (Signed)
Compensated Continue Metoprolol and Furosemide

## 2021-07-31 NOTE — Assessment & Plan Note (Signed)
Sildenafil refilled today 

## 2021-07-31 NOTE — Assessment & Plan Note (Signed)
Continue Omeprazole ?

## 2021-07-31 NOTE — Assessment & Plan Note (Signed)
Continue Jardience and Gabapentin

## 2021-08-02 ENCOUNTER — Other Ambulatory Visit: Payer: Self-pay | Admitting: Internal Medicine

## 2021-08-03 NOTE — Telephone Encounter (Signed)
Requested Prescriptions  Pending Prescriptions Disp Refills  . gabapentin (NEURONTIN) 400 MG capsule [Pharmacy Med Name: GABAPENTIN 400 MG CAPSULE] 90 capsule 2    Sig: TAKE ONE CAPSULE BY MOUTH THREE TIMES A DAY     Neurology: Anticonvulsants - gabapentin Passed - 08/02/2021  8:35 AM      Passed - Valid encounter within last 12 months    Recent Outpatient Visits          3 days ago Mixed hyperlipidemia   [facility] [place], [place], NP   6 months ago HFrEF (heart failure with reduced ejection fraction) [facility])   [facility], NP   7 months ago Bilateral lower extremity edema   [place], DO   7 months ago HFrEF (heart failure with reduced ejection fraction) [facility])   [place], [place]   8 months ago Chronic systolic heart failure [facility])   [facility] Kathrine Haddock, NP

## 2021-08-08 ENCOUNTER — Telehealth: Payer: Self-pay | Admitting: Internal Medicine

## 2021-08-08 DIAGNOSIS — Z5181 Encounter for therapeutic drug level monitoring: Secondary | ICD-10-CM

## 2021-08-08 DIAGNOSIS — I5020 Unspecified systolic (congestive) heart failure: Secondary | ICD-10-CM

## 2021-08-08 DIAGNOSIS — I5022 Chronic systolic (congestive) heart failure: Secondary | ICD-10-CM

## 2021-08-08 NOTE — Telephone Encounter (Signed)
Patient called, no answer, ringing busy. Unsure of which medications patient is requesting. [place] called and spoke to [place], [employer] and she says on yesterday he picked up Digoxin (no refills left on file), Sildenafil (no refills left on file) Trelegy and Gabapentin. She's not sure which medications the patient is requesting. If he calls back, will need to know all the names of the medications he is wanting refills on.

## 2021-08-08 NOTE — Telephone Encounter (Signed)
Patient called, phone did not ring, straight to busy signal.

## 2021-08-08 NOTE — Telephone Encounter (Signed)
Dialed patient, busy signal. Need clarification on the medications needed, will attempt to dial out later.

## 2021-08-08 NOTE — Telephone Encounter (Signed)
Medication Refill - Medication: Pt is requesting 3 month supply on ALL medications (pt stated he would be traveling). Stated that he doesn't know the names of all medications and we should have a list.   Has the patient contacted their pharmacy? Yes.    (Agent: If yes, when and what did the pharmacy advise?) Pt went to pick up medication was advised he had no refills.   Preferred Pharmacy (with phone number or street name):   [facility] [id number], [place] [address]  Phone: [phone] Fax: [phone]    Has the patient been seen for an appointment in the last year OR does the patient have an upcoming appointment? Yes.    Agent: Please be advised that RX refills may take up to 3 business days. We ask that you follow-up with your pharmacy.

## 2021-08-10 NOTE — Telephone Encounter (Signed)
Pt called back and annoyed no one has called him.  However, his phone number had not been updated, which I have corrected. Pt staes I need all my meds, I am going out of the [place] for 11 months.  He said she know which medications I need, it is in my chart.  He said I want her to call mem she said she would. Rollene Fare)  Pt declined to give me list of meds he needs b/c it is in his chart.  Nurse not avaiable. Please call pt

## 2021-08-11 MED ORDER — DIGOXIN 250 MCG PO TABS: 250.0000 ug | ORAL_TABLET | Freq: Every day | ORAL | 1 refills | Status: DC

## 2021-08-11 MED ORDER — ATORVASTATIN CALCIUM 80 MG PO TABS: 80.0000 mg | ORAL_TABLET | Freq: Every day | ORAL | 1 refills | Status: DC

## 2021-08-11 MED ORDER — FUROSEMIDE 20 MG PO TABS: 40.0000 mg | ORAL_TABLET | Freq: Every day | ORAL | 1 refills | Status: DC

## 2021-08-11 MED ORDER — GABAPENTIN 400 MG PO CAPS: 400.0000 mg | ORAL_CAPSULE | Freq: Three times a day (TID) | ORAL | 1 refills | Status: DC

## 2021-08-11 MED ORDER — EMPAGLIFLOZIN 10 MG PO TABS: ORAL_TABLET | ORAL | 1 refills | Status: DC

## 2021-08-11 MED ORDER — METOPROLOL SUCCINATE ER 25 MG PO TB24: 50.0000 mg | ORAL_TABLET | Freq: Every day | ORAL | 1 refills | Status: DC

## 2021-08-11 MED ORDER — POTASSIUM CHLORIDE ER 10 MEQ PO TBCR: 10.0000 meq | EXTENDED_RELEASE_TABLET | Freq: Every day | ORAL | 1 refills | Status: DC

## 2021-08-11 MED ORDER — DULOXETINE HCL 60 MG PO CPEP: 60.0000 mg | ORAL_CAPSULE | Freq: Every day | ORAL | 1 refills | Status: DC

## 2021-08-11 MED ORDER — OMEPRAZOLE 20 MG PO CPDR: 20.0000 mg | DELAYED_RELEASE_CAPSULE | Freq: Every day | ORAL | 1 refills | Status: DC

## 2021-08-11 MED ORDER — APIXABAN 5 MG PO TABS: 5.0000 mg | ORAL_TABLET | Freq: Two times a day (BID) | ORAL | 1 refills | Status: DC

## 2021-08-11 NOTE — Telephone Encounter (Signed)
Medications refilled

## 2021-08-11 NOTE — Addendum Note (Signed)
Addended by: Jearld Fenton on: 08/11/2021 11:57 AM   Modules accepted: Orders

## 2021-08-14 ENCOUNTER — Other Ambulatory Visit: Payer: Self-pay | Admitting: Unknown Physician Specialty

## 2021-08-14 DIAGNOSIS — I7123 Aneurysm of the descending thoracic aorta, without rupture: Secondary | ICD-10-CM | POA: Diagnosis not present

## 2021-08-14 DIAGNOSIS — I714 Abdominal aortic aneurysm, without rupture, unspecified: Secondary | ICD-10-CM | POA: Diagnosis not present

## 2021-08-14 DIAGNOSIS — Z720 Tobacco use: Secondary | ICD-10-CM | POA: Diagnosis not present

## 2021-08-14 DIAGNOSIS — J410 Simple chronic bronchitis: Secondary | ICD-10-CM | POA: Diagnosis not present

## 2021-08-14 DIAGNOSIS — I77810 Thoracic aortic ectasia: Secondary | ICD-10-CM | POA: Diagnosis not present

## 2021-08-14 DIAGNOSIS — I739 Peripheral vascular disease, unspecified: Secondary | ICD-10-CM | POA: Diagnosis not present

## 2021-08-14 DIAGNOSIS — Z7901 Long term (current) use of anticoagulants: Secondary | ICD-10-CM | POA: Diagnosis not present

## 2021-08-14 DIAGNOSIS — I480 Paroxysmal atrial fibrillation: Secondary | ICD-10-CM | POA: Diagnosis not present

## 2021-08-14 DIAGNOSIS — E7800 Pure hypercholesterolemia, unspecified: Secondary | ICD-10-CM | POA: Diagnosis not present

## 2021-08-14 NOTE — Telephone Encounter (Signed)
Requested medication (s) are due for refill today: yes  Requested medication (s) are on the active medication list: no  Last refill:  05/26/21  Future visit scheduled: No  Notes to clinic:  med was dc'd on 12/09/20, please advise     Requested Prescriptions  Pending Prescriptions Disp Refills   spironolactone (ALDACTONE) 25 MG tablet [Pharmacy Med Name: SPIRONOLACTONE 25 MG TABLET] 45 tablet     Sig: TAKE 1/2 TABLET BY MOUTH DAILY     Cardiovascular: Diuretics - Aldosterone Antagonist Failed - 08/14/2021  4:23 PM      Failed - Cr in normal range and within 360 days    Creat  Date Value Ref Range Status  07/31/2021 0.66 (L) 0.70 - 1.35 mg/dL Final          Passed - K in normal range and within 360 days    Potassium  Date Value Ref Range Status  07/31/2021 4.6 3.5 - 5.3 mmol/L Final          Passed - Na in normal range and within 360 days    Sodium  Date Value Ref Range Status  07/31/2021 138 135 - 146 mmol/L Final  11/14/2020 137 134 - 144 mmol/L Final          Passed - Last BP in normal range    BP Readings from Last 1 Encounters:  07/31/21 121/74          Passed - Valid encounter within last 6 months    Recent Outpatient Visits           2 weeks ago Mixed hyperlipidemia   [place], Coralie Keens, NP   7 months ago HFrEF (heart failure with reduced ejection fraction) [facility])   [facility] Jearld Fenton, NP   8 months ago Bilateral lower extremity edema   [place], DO   8 months ago HFrEF (heart failure with reduced ejection fraction) [facility])   [place], [place]   9 months ago Chronic systolic heart failure [facility])   [facility] Kathrine Haddock, NP

## 2021-09-02 ENCOUNTER — Other Ambulatory Visit: Payer: Self-pay | Admitting: Internal Medicine

## 2021-09-02 NOTE — Telephone Encounter (Signed)
Requested Prescriptions  Pending Prescriptions Disp Refills   sildenafil (VIAGRA) 50 MG tablet [Pharmacy Med Name: SILDENAFIL 50 MG TABLET] 30 tablet 0    Sig: TAKE ONE TABLET BY MOUTH DAILY AS NEEDED  FOR ERECTILE DYSFUNCTION     Urology: Erectile Dysfunction Agents Passed - 09/02/2021  6:47 AM      Passed - Last BP in normal range    BP Readings from Last 1 Encounters:  07/31/21 121/74         Passed - Valid encounter within last 12 months    Recent Outpatient Visits          1 month ago Mixed hyperlipidemia   [place], [place] W, NP   7 months ago HFrEF (heart failure with reduced ejection fraction) [facility])   [facility], NP   8 months ago Bilateral lower extremity edema   [place], DO   8 months ago HFrEF (heart failure with reduced ejection fraction) [facility])   [place], [place]   9 months ago Chronic systolic heart failure [facility])   [facility] Kathrine Haddock, NP

## 2021-10-01 ENCOUNTER — Other Ambulatory Visit: Payer: Self-pay | Admitting: Internal Medicine

## 2021-10-01 NOTE — Telephone Encounter (Signed)
Requested Prescriptions  Pending Prescriptions Disp Refills   sildenafil (VIAGRA) 50 MG tablet [Pharmacy Med Name: SILDENAFIL 50 MG TABLET] 30 tablet 0    Sig: TAKE ONE TABLET BY MOUTH DAILY AS NEEDED FOR FOR ERECTILE DYSFUNCTION     Urology: Erectile Dysfunction Agents Passed - 10/01/2021  6:47 AM      Passed - Last BP in normal range    BP Readings from Last 1 Encounters:  07/31/21 121/74         Passed - Valid encounter within last 12 months    Recent Outpatient Visits          2 months ago Mixed hyperlipidemia   [place], [place] W, NP   8 months ago HFrEF (heart failure with reduced ejection fraction) [facility])   [facility], NP   9 months ago Bilateral lower extremity edema   [place], DO   9 months ago HFrEF (heart failure with reduced ejection fraction) [facility])   [place], [place]   10 months ago Chronic systolic heart failure [facility])   [facility] Kathrine Haddock, NP

## 2021-10-17 DIAGNOSIS — J449 Chronic obstructive pulmonary disease, unspecified: Secondary | ICD-10-CM | POA: Diagnosis not present

## 2021-10-17 DIAGNOSIS — I428 Other cardiomyopathies: Secondary | ICD-10-CM | POA: Diagnosis not present

## 2021-10-17 DIAGNOSIS — I5020 Unspecified systolic (congestive) heart failure: Secondary | ICD-10-CM | POA: Diagnosis not present

## 2021-10-17 DIAGNOSIS — F17210 Nicotine dependence, cigarettes, uncomplicated: Secondary | ICD-10-CM | POA: Diagnosis not present

## 2021-10-24 DIAGNOSIS — I5020 Unspecified systolic (congestive) heart failure: Secondary | ICD-10-CM | POA: Diagnosis not present

## 2021-11-10 ENCOUNTER — Other Ambulatory Visit: Payer: Self-pay | Admitting: Unknown Physician Specialty

## 2021-11-10 NOTE — Telephone Encounter (Signed)
Requested medications are due for refill today.  no ? ?Requested medications are on the active medications list.  no ? ?Last refill. 11/10/2020 ? ?Future visit scheduled.   no ? ?Notes to clinic.  Medication was discontinued 12/09/2020. ? ? ? ?Requested Prescriptions  ?Pending Prescriptions Disp Refills  ? spironolactone (ALDACTONE) 25 MG tablet [Pharmacy Med Name: SPIRONOLACTONE 25 MG TABLET] 45 tablet   ?  Sig: TAKE 1/2 TABLET BY MOUTH DAILY  ?  ? Cardiovascular: Diuretics - Aldosterone Antagonist Failed - 11/10/2021  8:44 AM  ?  ?  Failed - Cr in normal range and within 180 days  ?  Creat  ?Date Value Ref Range Status  ?07/31/2021 0.66 (L) 0.70 - 1.35 mg/dL Final  ?  ?  ?  ?  Passed - K in normal range and within 180 days  ?  Potassium  ?Date Value Ref Range Status  ?07/31/2021 4.6 3.5 - 5.3 mmol/L Final  ?  ?  ?  ?  Passed - Na in normal range and within 180 days  ?  Sodium  ?Date Value Ref Range Status  ?07/31/2021 138 135 - 146 mmol/L Final  ?11/14/2020 137 134 - 144 mmol/L Final  ?  ?  ?  ?  Passed - eGFR is 30 or above and within 180 days  ?  GFR, Est African American  ?Date Value Ref Range Status  ?12/18/2017 109 > OR = 60 mL/min/1.35m Final  ? ?GFR, Est Non African American  ?Date Value Ref Range Status  ?12/18/2017 94 > OR = 60 mL/min/1.769mFinal  ? ?eGFR  ?Date Value Ref Range Status  ?07/31/2021 105 > OR = 60 mL/min/1.7385minal  ?  Comment:  ?  The eGFR is based on the CKD-EPI 2021 equation. To calculate  ?the new eGFR from a previous Creatinine or Cystatin C ?result, go to https://www.kidney.org/professionals/ ?kdoqi/gfr%5Fcalculator ?  ?11/14/2020 105 >59 mL/min/1.73 Final  ?  ?  ?  ?  Passed - Last BP in normal range  ?  BP Readings from Last 1 Encounters:  ?07/31/21 121/74  ?  ?  ?  ?  Passed - Valid encounter within last 6 months  ?  Recent Outpatient Visits   ? ?      ? 3 months ago [facility] hyper[facility]  ? [facility] [place] [facility]  ? 10 months ago HFrEF (heart failure with  reduced ejection fraction) ([place] [place] [facility]  ? 11 months ago Bilateral lower extremity edema  ? [place]  ? 11 months ago HFrEF (heart failure with reduced ejection fraction) ([place]? [place] 1 year ago [facility] ([facility]  ? ?  ?  ? ?  ?  ?  ?  ?

## 2021-11-29 ENCOUNTER — Other Ambulatory Visit: Payer: Self-pay | Admitting: Unknown Physician Specialty

## 2021-11-29 NOTE — Telephone Encounter (Signed)
Not on current med list. ?Requested Prescriptions  ?Pending Prescriptions Disp Refills  ?? spironolactone (ALDACTONE) 25 MG tablet [Pharmacy Med Name: SPIRONOLACTONE 25 MG TABLET] 45 tablet   ?  Sig: TAKE 1/2 TABLET BY MOUTH DAILY  ?  ? Cardiovascular: Diuretics - Aldosterone Antagonist Failed - 11/29/2021 10:43 AM  ?  ?  Failed - Cr in normal range and within 180 days  ?  Creat  ?Date Value Ref Range Status  ?07/31/2021 0.66 (L) 0.70 - 1.35 mg/dL Final  ?   ?  ?  Passed - K in normal range and within 180 days  ?  Potassium  ?Date Value Ref Range Status  ?07/31/2021 4.6 3.5 - 5.3 mmol/L Final  ?   ?  ?  Passed - Na in normal range and within 180 days  ?  Sodium  ?Date Value Ref Range Status  ?07/31/2021 138 135 - 146 mmol/L Final  ?11/14/2020 137 134 - 144 mmol/L Final  ?   ?  ?  Passed - eGFR is 30 or above and within 180 days  ?  GFR, Est African American  ?Date Value Ref Range Status  ?12/18/2017 109 > OR = 60 mL/min/1.20m Final  ? ?GFR, Est Non African American  ?Date Value Ref Range Status  ?12/18/2017 94 > OR = 60 mL/min/1.744mFinal  ? ?eGFR  ?Date Value Ref Range Status  ?07/31/2021 105 > OR = 60 mL/min/1.737minal  ?  Comment:  ?  The eGFR is based on the CKD-EPI 2021 equation. To calculate  ?the new eGFR from a previous Creatinine or Cystatin C ?result, go to https://www.kidney.org/professionals/ ?kdoqi/gfr%5Fcalculator ?  ?11/14/2020 105 >59 mL/min/1.73 Final  ?   ?  ?  Passed - Last BP in normal range  ?  BP Readings from Last 1 Encounters:  ?07/31/21 121/74  ?   ?  ?  Passed - Valid encounter within last 6 months  ?  Recent Outpatient Visits   ?      ? 4 months ago Mixed hyperlipidemia  ? [facility] [facility]  ? 10 months ago HFrEF (heart failure with reduced ejection fraction) ([place]? [facility]  ? 11 months ago Bilateral lower extremity edema  ? [place]  ? 11 months ago HFrEF (heart  failure with reduced ejection fraction) ([place]? [place] 1 year ago [facility] ([facility] HaddockP  ?  ?  ? ?  ?  ?  ? ? ?

## 2021-11-30 NOTE — Telephone Encounter (Signed)
Pt wanted to know with this Rx refill request was denied, please advise.  ?

## 2021-12-07 ENCOUNTER — Ambulatory Visit (INDEPENDENT_AMBULATORY_CARE_PROVIDER_SITE_OTHER): Payer: Medicare HMO

## 2021-12-07 DIAGNOSIS — Z0000 Encounter for general adult medical examination without abnormal findings: Secondary | ICD-10-CM

## 2021-12-07 NOTE — Patient Instructions (Signed)

## 2021-12-07 NOTE — Progress Notes (Signed)
? ? ?I connected with Ricardo Cervantes today by telephone and verified that I am speaking with the correct person using two identifiers. ?Location patient: home ?Location provider: work ?Persons participating in the virtual visit:  ? ?Ricardo Cervantes, Ricardo Cervantes, CMA ? ?I discussed the limitations, risks, security and privacy concerns of performing an evaluation and management service by telephone and the availability of in person appointments. I also discussed with the patient that there may be a patient responsible charge related to this service. The patient expressed understanding and verbally consented to this telephonic visit.  ?  ? ?Subjective:  ? Ricardo LAURENT Sr. is a 66 y.o. male who presents for Medicare Annual/Subsequent preventive examination. ? ?Review of Systems    ?Per HPI unless specifically indicated  below  ?  ?Lower extremity: numbness, tingling and burning sensation in the left foot. ? GU: Pt reports erectile dysfunction. Urinary incontinence. Denies urgency, frequency, pain with urination, burning sensation, blood in urine, odor or discharge ?Objective:  ?  ?Today's Vitals  ? 12/07/21 1424  ?PainSc: 4   ? ?There is no height or weight on file to calculate BMI. ? ? ?  07/04/2017  ?  2:43 PM 06/26/2017  ?  9:37 AM 06/19/2017  ?  6:10 PM 06/19/2017  ?  9:25 AM 06/13/2017  ?  2:47 PM 05/22/2017  ?  3:30 PM 05/09/2017  ?  1:56 PM  ?Advanced Directives  ?Does Patient Have a Medical Advance Directive? No No No No No No No  ?Would patient like information on creating a medical advance directive?  No - Patient declined No - Patient declined  No - Patient declined    ? ? ?Current Medications (verified) ?Outpatient Encounter Medications as of 12/07/2021  ?Medication Sig  ? apixaban (ELIQUIS) 5 MG TABS tablet Take 1 tablet (5 mg total) by mouth 2 (two) times daily.  ? atorvastatin (LIPITOR) 80 MG tablet Take 1 tablet (80 mg total) by mouth daily.  ? digoxin (LANOXIN) 0.25 MG tablet Take 1 tablet (250 mcg  total) by mouth daily.  ? DULoxetine (CYMBALTA) 60 MG capsule Take 1 capsule (60 mg total) by mouth daily.  ? empagliflozin (JARDIANCE) 10 MG TABS tablet TAKE ONE TABLET BY MOUTH DAILY BEFORE BREAKFAST  ? Fluticasone-Umeclidin-Vilant (TRELEGY ELLIPTA) 100-62.5-25 MCG/INH AEPB Inhale 1 application into the lungs daily.  ? gabapentin (NEURONTIN) 400 MG capsule Take 1 capsule (400 mg total) by mouth 3 (three) times daily.  ? metoprolol succinate (TOPROL-XL) 25 MG 24 hr tablet Take 2 tablets (50 mg total) by mouth daily. (Patient taking differently: Take 25 mg by mouth daily.)  ? omeprazole (PRILOSEC) 20 MG capsule Take 1 capsule (20 mg total) by mouth daily.  ? potassium chloride (KLOR-CON) 10 MEQ tablet Take 1 tablet (10 mEq total) by mouth daily.  ? sildenafil (VIAGRA) 50 MG tablet TAKE ONE TABLET BY MOUTH DAILY AS NEEDED FOR FOR ERECTILE DYSFUNCTION  ? albuterol (VENTOLIN HFA) 108 (90 Base) MCG/ACT inhaler  (Patient not taking: Reported on 12/07/2021)  ? furosemide (LASIX) 20 MG tablet Take 2 tablets (40 mg total) by mouth daily. (Patient not taking: Reported on 12/07/2021)  ? ?No facility-administered encounter medications on file as of 12/07/2021.  ? ? ?Allergies (verified) ?Chantix [varenicline]  ? ?History: ?Past Medical History:  ?Diagnosis Date  ? Arthritis   ? Rheumatoid  ? Bipolar disorder ([place])   ? Complication of anesthesia   ? difficulty keeping patient asleep. fentanyl makes patient mean, grumpy and difficult  ?  Depression   ? Fusion of spine   ? GERD (gastroesophageal reflux disease)   ? slightly uncomfortable. takes otc antacids occasionally  ? Headache   ? deteriorating discs causing headaches. uses advil  ? Hepatitis C   ? HEP "C". treated 2 years ago  ? History of kidney stones 2016  ? Hyperlipidemia   ? Pericarditis   ? 20 years ago  ? Peripheral vascular disease ([place])   ? Smoker   ? ?Past Surgical History:  ?Procedure Laterality Date  ? ABDOMINAL AORTOGRAM W/LOWER EXTREMITY N/A 01/15/2017  ? Procedure:  Abdominal Aortogram w/Lower Extremity;  Surgeon: Katha Cabal, MD;  Location: [place] CV [facility];  Service: Cardiovascular;  Laterality: N/A;  ? BACK SURGERY  2004  ? acdf, titanium c5-6  ? FEMORAL-POPLITEAL BYPASS GRAFT Right 06/19/2017  ? Procedure: BYPASS GRAFT FEMORAL-POPLITEAL ARTERY;  Surgeon: Katha Cabal, MD;  Location: [facility] ORS;  Service: Vascular;  Laterality: Right;  ? LOWER EXTREMITY ANGIOGRAPHY Right 01/15/2017  ? Procedure: Lower Extremity Angiography;  Surgeon: Katha Cabal, MD;  Location: [place] [facility];  Service: Cardiovascular;  Laterality: Right;  ? LOWER EXTREMITY ANGIOGRAPHY Right 02/13/2017  ? Procedure: Lower Extremity Angiography;  Surgeon: Katha Cabal, MD;  Location: [place] [facility];  Service: Cardiovascular;  Laterality: Right;  ? ?Family History  ?Problem Relation Age of Onset  ? Cancer Mother   ?     liver  ? Heart disease Father   ? Diabetes Father   ? Breast cancer Sister   ? Brain cancer Sister   ? ?Social History  ? ?Socioeconomic History  ? Marital status: Single  ?  Spouse name: Not on file  ? Number of children: Not on file  ? Years of education: Not on file  ? Highest education level: Not on file  ?Occupational History  ? Not on file  ?Tobacco Use  ? Smoking status: Every Day  ?  Packs/day: 0.50  ?  Years: 40.00  ?  Pack years: 20.00  ?  Types: Cigarettes  ? Smokeless tobacco: Never  ?Vaping Use  ? Vaping Use: Former  ?Substance and Sexual Activity  ? Alcohol use: Yes  ?  Alcohol/week: 2.0 standard drinks  ?  Types: 2 Shots of liquor per week  ?  Comment: occasional (every 3-4 weeks)   ? Drug use: No  ?  Comment: polysubstance approx 2 years ago  ? Sexual activity: Not Currently  ?Other Topics Concern  ? Not on file  ?Social History Narrative  ? Not on file  ? ?Social Determinants of Health  ? ?Financial Resource Strain: Low Risk   ? Difficulty of Paying Living Expenses: Not hard at all  ?Food Insecurity: No Food Insecurity  ? Worried About  Charity fundraiser in the Last Year: Never true  ? Ran Out of Food in the Last Year: Never true  ?Transportation Needs: No Transportation Needs  ? Lack of Transportation (Medical): No  ? Lack of Transportation (Non-Medical): No  ?Physical Activity: Insufficiently Active  ? Days of Exercise per Week: 4 days  ? Minutes of Exercise per Session: 10 min  ?Stress: No Stress Concern Present  ? Feeling of Stress : Not at all  ?Social Connections: Moderately Integrated  ? Frequency of Communication with Friends and Family: More than three times a week  ? Frequency of Social Gatherings with Friends and Family: More than three times a week  ? Attends Religious Services: 1 to 4  times per year  ? Active Member of Clubs or Organizations: No  ? Attends Archivist Meetings: 1 to 4 times per year  ? Marital Status: Never married  ? ? ?Tobacco Counseling ?Ready to quit: Not Answered ?Counseling given: Not Answered ? ? ?Clinical Intake: ? ?  ? ?Pain : 0-10 ?Pain Score: 4  ?Pain Type: Chronic pain ?Pain Location: Foot ?Pain Orientation: Left ?Pain Descriptors / Indicators: Burning, Numbness ?Pain Frequency: Constant ? ?  ? ?Nutritional Status: BMI of 19-24  Normal ?Diabetes: No ? ?  ? ?Diabetic? Yes ? ?Interpreter Needed?: No ? ?Information entered by :: Ricardo Cervantes, CMA ? ? ?Activities of Daily Living ? ?  12/07/2021  ?  2:33 PM 01/06/2021  ?  3:49 PM  ?In your present state of health, do you have any difficulty performing the following activities:  ?Hearing? 1 1  ?Vision? 1 1  ?Difficulty concentrating or making decisions? 0 0  ?Walking or climbing stairs? 1 1  ?Dressing or bathing? 0 0  ?Doing errands, shopping? 0 1  ?Preparing Food and eating ? N   ?Using the Toilet? N   ?In the past six months, have you accidently leaked urine? Y   ?Do you have problems with loss of bowel control? N   ?Managing your Medications? N   ?Managing your Finances? N   ?Housekeeping or managing your Housekeeping? N   ? ? ?Patient Care  Team: ?Jearld Fenton, NP as PCP - General (Internal Medicine) ?[place], [facility]  ?Janae Sauce Vascular Surgeon  ? ?Indicate any recent Medical Services you may have received from other than [facility]

## 2021-12-14 ENCOUNTER — Ambulatory Visit (INDEPENDENT_AMBULATORY_CARE_PROVIDER_SITE_OTHER): Payer: Medicare HMO | Admitting: Internal Medicine

## 2021-12-14 ENCOUNTER — Encounter: Payer: Self-pay | Admitting: Internal Medicine

## 2021-12-14 VITALS — BP 148/92 | HR 88 | Temp 97.5°F | Wt 160.0 lb

## 2021-12-14 DIAGNOSIS — R350 Frequency of micturition: Secondary | ICD-10-CM | POA: Diagnosis not present

## 2021-12-14 DIAGNOSIS — G6289 Other specified polyneuropathies: Secondary | ICD-10-CM | POA: Diagnosis not present

## 2021-12-14 DIAGNOSIS — R7309 Other abnormal glucose: Secondary | ICD-10-CM | POA: Diagnosis not present

## 2021-12-14 DIAGNOSIS — F319 Bipolar disorder, unspecified: Secondary | ICD-10-CM | POA: Diagnosis not present

## 2021-12-14 DIAGNOSIS — M0550 Rheumatoid polyneuropathy with rheumatoid arthritis of unspecified site: Secondary | ICD-10-CM | POA: Diagnosis not present

## 2021-12-14 DIAGNOSIS — E559 Vitamin D deficiency, unspecified: Secondary | ICD-10-CM | POA: Diagnosis not present

## 2021-12-14 DIAGNOSIS — G629 Polyneuropathy, unspecified: Secondary | ICD-10-CM | POA: Diagnosis not present

## 2021-12-14 DIAGNOSIS — N401 Enlarged prostate with lower urinary tract symptoms: Secondary | ICD-10-CM | POA: Diagnosis not present

## 2021-12-14 DIAGNOSIS — N400 Enlarged prostate without lower urinary tract symptoms: Secondary | ICD-10-CM | POA: Insufficient documentation

## 2021-12-14 DIAGNOSIS — G9332 Myalgic encephalomyelitis/chronic fatigue syndrome: Secondary | ICD-10-CM | POA: Diagnosis not present

## 2021-12-14 DIAGNOSIS — M069 Rheumatoid arthritis, unspecified: Secondary | ICD-10-CM | POA: Diagnosis not present

## 2021-12-14 MED ORDER — APIXABAN 5 MG PO TABS: 5.0000 mg | ORAL_TABLET | Freq: Two times a day (BID) | ORAL | 1 refills | Status: DC

## 2021-12-14 MED ORDER — EMPAGLIFLOZIN 10 MG PO TABS: ORAL_TABLET | ORAL | 1 refills | Status: DC

## 2021-12-14 MED ORDER — TAMSULOSIN HCL 0.4 MG PO CAPS: 0.4000 mg | ORAL_CAPSULE | Freq: Every day | ORAL | 1 refills | Status: DC

## 2021-12-14 MED ORDER — DULOXETINE HCL 60 MG PO CPEP: 60.0000 mg | ORAL_CAPSULE | Freq: Every day | ORAL | 1 refills | Status: DC

## 2021-12-14 MED ORDER — ATORVASTATIN CALCIUM 80 MG PO TABS: 80.0000 mg | ORAL_TABLET | Freq: Every day | ORAL | 1 refills | Status: DC

## 2021-12-14 MED ORDER — GABAPENTIN 600 MG PO TABS: 600.0000 mg | ORAL_TABLET | Freq: Three times a day (TID) | ORAL | 1 refills | Status: DC

## 2021-12-14 MED ORDER — SILDENAFIL CITRATE 50 MG PO TABS: ORAL_TABLET | ORAL | 0 refills | Status: DC

## 2021-12-14 MED ORDER — METOPROLOL SUCCINATE ER 25 MG PO TB24: 25.0000 mg | ORAL_TABLET | Freq: Every day | ORAL | 1 refills | Status: DC

## 2021-12-14 MED ORDER — DIGOXIN 250 MCG PO TABS: 250.0000 ug | ORAL_TABLET | Freq: Every day | ORAL | 1 refills | Status: DC

## 2021-12-14 MED ORDER — OMEPRAZOLE 20 MG PO CPDR: 20.0000 mg | DELAYED_RELEASE_CAPSULE | Freq: Every day | ORAL | 1 refills | Status: DC

## 2021-12-14 NOTE — Patient Instructions (Signed)
Neuropathic Pain °Neuropathic pain is pain caused by damage to the nerves that are responsible for certain sensations in your body (sensory nerves). °Neuropathic pain can make you more sensitive to pain. Even a minor sensation can feel very painful. This is usually a long-term (chronic) condition that can be difficult to treat. The type of pain differs from person to person. It may: °Start suddenly (acute), or it may develop slowly and become chronic. °Come and go as damaged nerves heal, or it may stay at the same level for years. °Cause emotional distress, loss of sleep, and a lower quality of life. °What are the causes? °The most common cause of this condition is diabetes. Many other diseases and conditions can also cause neuropathic pain. Causes of neuropathic pain can be classified as: °Toxic. This is caused by medicines and chemicals. The most common causes of toxic neuropathic pain is damage from medicines that kill cancer cells (chemotherapy) or alcohol abuse. °Metabolic. This can be caused by: °Diabetes. °Lack of vitamins like B12. °Traumatic. Any injury that cuts, crushes, or stretches a nerve can cause damage and pain. °Compression-related. If a sensory nerve gets trapped or compressed for a long period of time, the blood supply to the nerve can be cut off. °Vascular. Many blood vessel diseases can cause neuropathic pain by decreasing blood supply and oxygen to nerves. °Autoimmune. This type of pain results from diseases in which the body's defense system (immune system) mistakenly attacks sensory nerves. Examples of autoimmune diseases that can cause neuropathic pain include lupus and multiple sclerosis. °Infectious. Many types of viral infections can damage sensory nerves and cause pain. Shingles infection is a common cause of this type of pain. °Inherited. Neuropathic pain can be a symptom of many diseases that are passed down through families (genetic). °What increases the risk? °You are more likely to  develop this condition if: °You have diabetes. °You smoke. °You drink too much alcohol. °You are taking certain medicines, including chemotherapy or medicines that treat immune system disorders. °What are the signs or symptoms? °The main symptom is pain. Neuropathic pain is often described as: °Burning. °Shock-like. °Stinging. °Hot or cold. °Itching. °How is this diagnosed? °No single test can diagnose neuropathic pain. It is diagnosed based on: °A physical exam and your symptoms. Your health care provider will ask you about your pain. You may be asked to use a pain scale to describe how bad your pain is. °Tests. These may be done to see if you have a cause and location of any nerve damage. They include: °Nerve conduction studies and electromyography to test how well nerve signals travel through your nerves and muscles (electrodiagnostic testing). °Skin biopsy to evaluate for small fiber neuropathy. °Imaging studies, such as: °X-rays. °CT scan. °MRI. °How is this treated? °Treatment for neuropathic pain may change over time. You may need to try different treatment options or a combination of treatments. Some options include: °Treating the underlying cause of the neuropathy, such as diabetes, kidney disease, or vitamin deficiencies. °Stopping medicines that can cause neuropathy, such as chemotherapy. °Medicine to relieve pain. Medicines may include: °Prescription or over-the-counter pain medicine. °Anti-seizure medicine. °Antidepressant medicines. °Pain-relieving patches or creams that are applied to painful areas of skin. °A medicine to numb the area (local anesthetic), which can be injected as a nerve block. °Transcutaneous nerve stimulation. This uses electrical currents to block painful nerve signals. The treatment is painless. °Alternative treatments, such as: °Acupuncture. °Meditation. °Massage. °Occupational or physical therapy. °Pain management programs. °Counseling. °Follow   these instructions at  home: °Medicines ° °Take over-the-counter and prescription medicines only as told by your health care provider. °Ask your health care provider if the medicine prescribed to you: °Requires you to avoid driving or using machinery. °Can cause constipation. You may need to take these actions to prevent or treat constipation: °Drink enough fluid to keep your urine pale yellow. °Take over-the-counter or prescription medicines. °Eat foods that are high in fiber, such as beans, whole grains, and fresh fruits and vegetables. °Limit foods that are high in fat and processed sugars, such as fried or sweet foods. °Lifestyle ° °Have a good support system at home. °Consider joining a chronic pain support group. °Do not use any products that contain nicotine or tobacco. These products include cigarettes, chewing tobacco, and vaping devices, such as e-cigarettes. If you need help quitting, ask your health care provider. °Do not drink alcohol. °General instructions °Learn as much as you can about your condition. °Work closely with all your health care providers to find the treatment plan that works best for you. °Ask your health care provider what activities are safe for you. °Keep all follow-up visits. This is important. °Contact a health care provider if: °Your pain treatments are not working. °You are having side effects from your medicines. °You are struggling with tiredness (fatigue), mood changes, depression, or anxiety. °Get help right away if: °You have thoughts of hurting yourself. °Get help right away if you feel like you may hurt yourself or others, or have thoughts about taking your own life. Go to your nearest emergency room or: °Call 911. °Call the National Suicide Prevention Lifeline at 1-800-273-8255 or 988. This is open 24 hours a day. °Text the Crisis Text Line at 741741. °Summary °Neuropathic pain is pain caused by damage to the nerves that are responsible for certain sensations in your body (sensory  nerves). °Neuropathic pain may come and go as damaged nerves heal, or it may stay at the same level for years. °Neuropathic pain is usually a long-term condition that can be difficult to treat. Consider joining a chronic pain support group. °This information is not intended to replace advice given to you by your health care provider. Make sure you discuss any questions you have with your health care provider. °Document Revised: 04/17/2021 Document Reviewed: 04/17/2021 °Elsevier Patient Education © 2022 [employer] ° °

## 2021-12-14 NOTE — Progress Notes (Signed)
? ?Subjective:  ? ? Patient ID: Ricardo Folks Sr., male    DOB: 03-Jul-1956, 66 y.o.   MRN: [id number] ? ?HPI ? ?Patient presents to clinic today with complaint of numbness and burning in his left and right foot.  This started a year ago after his left femoropopliteal bypass.  He has a history of neuropathy, managed on Gabapentin which she reports does not seem to help.  He denies low back pain.  He has no history of diabetes. ? ?He also reports urinary urgency with incontinence, urinary frequency, nocturia.  He reports he has been told that he has enlarged prostate in the past.  He is not taking anything for this. ? ?Review of Systems ? ?   ?Past Medical History:  ?Diagnosis Date  ? Arthritis   ? Rheumatoid  ? Bipolar disorder ([place])   ? Complication of anesthesia   ? difficulty keeping patient asleep. fentanyl makes patient mean, grumpy and difficult  ? Depression   ? Fusion of spine   ? GERD (gastroesophageal reflux disease)   ? slightly uncomfortable. takes otc antacids occasionally  ? Headache   ? deteriorating discs causing headaches. uses advil  ? Hepatitis C   ? HEP "C". treated 2 years ago  ? History of kidney stones 2016  ? Hyperlipidemia   ? Pericarditis   ? 20 years ago  ? Peripheral vascular disease ([place])   ? Smoker   ? ? ?Current Outpatient Medications  ?Medication Sig Dispense Refill  ? albuterol (VENTOLIN HFA) 108 (90 Base) MCG/ACT inhaler  (Patient not taking: Reported on 12/07/2021)    ? apixaban (ELIQUIS) 5 MG TABS tablet Take 1 tablet (5 mg total) by mouth 2 (two) times daily. 180 tablet 1  ? atorvastatin (LIPITOR) 80 MG tablet Take 1 tablet (80 mg total) by mouth daily. 90 tablet 1  ? digoxin (LANOXIN) 0.25 MG tablet Take 1 tablet (250 mcg total) by mouth daily. 90 tablet 1  ? DULoxetine (CYMBALTA) 60 MG capsule Take 1 capsule (60 mg total) by mouth daily. 90 capsule 1  ? empagliflozin (JARDIANCE) 10 MG TABS tablet TAKE ONE TABLET BY MOUTH DAILY BEFORE BREAKFAST 90 tablet 1  ?  Fluticasone-Umeclidin-Vilant (TRELEGY ELLIPTA) 100-62.5-25 MCG/INH AEPB Inhale 1 application into the lungs daily.    ? furosemide (LASIX) 20 MG tablet Take 2 tablets (40 mg total) by mouth daily. (Patient not taking: Reported on 12/07/2021) 90 tablet 1  ? gabapentin (NEURONTIN) 400 MG capsule Take 1 capsule (400 mg total) by mouth 3 (three) times daily. 270 capsule 1  ? metoprolol succinate (TOPROL-XL) 25 MG 24 hr tablet Take 2 tablets (50 mg total) by mouth daily. (Patient taking differently: Take 25 mg by mouth daily.) 180 tablet 1  ? omeprazole (PRILOSEC) 20 MG capsule Take 1 capsule (20 mg total) by mouth daily. 90 capsule 1  ? potassium chloride (KLOR-CON) 10 MEQ tablet Take 1 tablet (10 mEq total) by mouth daily. 90 tablet 1  ? sildenafil (VIAGRA) 50 MG tablet TAKE ONE TABLET BY MOUTH DAILY AS NEEDED FOR FOR ERECTILE DYSFUNCTION 30 tablet 0  ? ?No current facility-administered medications for this visit.  ? ? ?Allergies  ?Allergen Reactions  ? Chantix [Varenicline] Other (See Comments)  ?  Depression  ? ? ?Family History  ?Problem Relation Age of Onset  ? Cancer Mother   ?     liver  ? Heart disease Father   ? Diabetes Father   ? Breast cancer Sister   ?  Brain cancer Sister   ? ? ?Social History  ? ?Socioeconomic History  ? Marital status: Single  ?  Spouse name: Not on file  ? Number of children: Not on file  ? Years of education: Not on file  ? Highest education level: Not on file  ?Occupational History  ? Not on file  ?Tobacco Use  ? Smoking status: Every Day  ?  Packs/day: 0.50  ?  Years: 40.00  ?  Pack years: 20.00  ?  Types: Cigarettes  ? Smokeless tobacco: Never  ?Vaping Use  ? Vaping Use: Former  ?Substance and Sexual Activity  ? Alcohol use: Yes  ?  Alcohol/week: 2.0 standard drinks  ?  Types: 2 Shots of liquor per week  ?  Comment: occasional (every 3-4 weeks)   ? Drug use: No  ?  Comment: polysubstance approx 2 years ago  ? Sexual activity: Not Currently  ?Other Topics Concern  ? Not on file  ?Social  History Narrative  ? Not on file  ? ?Social Determinants of Health  ? ?Financial Resource Strain: Low Risk   ? Difficulty of Paying Living Expenses: Not hard at all  ?Food Insecurity: No Food Insecurity  ? Worried About Charity fundraiser in the Last Year: Never true  ? Ran Out of Food in the Last Year: Never true  ?Transportation Needs: No Transportation Needs  ? Lack of Transportation (Medical): No  ? Lack of Transportation (Non-Medical): No  ?Physical Activity: Insufficiently Active  ? Days of Exercise per Week: 4 days  ? Minutes of Exercise per Session: 10 min  ?Stress: No Stress Concern Present  ? Feeling of Stress : Not at all  ?Social Connections: Moderately Integrated  ? Frequency of Communication with Friends and Family: More than three times a week  ? Frequency of Social Gatherings with Friends and Family: More than three times a week  ? Attends Religious Services: 1 to 4 times per year  ? Active Member of Clubs or Organizations: No  ? Attends Archivist Meetings: 1 to 4 times per year  ? Marital Status: Never married  ?Intimate Partner Violence: Not on file  ? ? ? ?Constitutional: Denies fever, malaise, fatigue, headache or abrupt weight changes.  ?Respiratory: Denies difficulty breathing, shortness of breath, cough or sputum production.   ?Cardiovascular: Denies chest pain, chest tightness, palpitations or swelling in the hands or feet.  ?Gastrointestinal: Denies abdominal pain, bloating, constipation, diarrhea or blood in the stool.  ?GU: Patient reports urinary incontinence, urgency, frequency and nocturia.  Denies pain with urination, burning sensation, blood in urine, odor or discharge. ?Musculoskeletal: Denies decrease in range of motion, difficulty with gait, muscle pain or joint pain and swelling.  ?Skin: Denies redness, rashes, lesions or ulcercations.  ?Neurological: Patient reports neuropathic pain in feet, difficulty with balance.  Denies dizziness, difficulty with memory,  difficulty with speech or problems with coordination.  ? ? ?No other specific complaints in a complete review of systems (except as listed in HPI above). ? ?Objective:  ? Physical Exam ? ?BP (!) 148/92 (BP Location: Right Arm, Patient Position: Sitting, Cuff Size: Normal)   Pulse 88   Temp (!) 97.5 ?F (36.4 ?C) (Temporal)   Wt 160 lb (72.6 kg)   SpO2 99%   BMI 20.54 kg/m?  ? ?Wt Readings from Last 3 Encounters:  ?07/31/21 151 lb (68.5 kg)  ?01/06/21 140 lb 12.8 oz (63.9 kg)  ?12/15/20 137 lb (62.1 kg)  ? ? ?  General: Appears his stated age, chronically ill-appearing, in NAD. ?Skin: Warm, dry and intact.  Report noted of BLE, no ulcers present. ?Cardiovascular: Normal rate and rhythm. S1,S2 noted.  No murmur, rubs or gallops noted. No JVD or BLE edema.  Pedal pulse 2+ on the right, 1+ on the left. ?Pulmonary/Chest: Normal effort and positive vesicular breath sounds. No respiratory distress. No wheezes, rales or ronchi noted.  ?Abdomen: Soft and nontender. Normal bowel sounds.  ?Rectal: External hemorrhoid noted.  Normal rectal tone.  Prostate enlarged without nodules, nontender. ?Musculoskeletal: Gait slow and steady with use of cane. ?Neurological: Alert and oriented.  Sensation decreased to bilateral feet by monofilament testing. ? ? ?BMET ?   ?Component Value Date/Time  ? NA 138 07/31/2021 1340  ? NA 137 11/14/2020 1409  ? K 4.6 07/31/2021 1340  ? CL 101 07/31/2021 1340  ? CO2 29 07/31/2021 1340  ? GLUCOSE 92 07/31/2021 1340  ? BUN 26 (H) 07/31/2021 1340  ? BUN 15 11/14/2020 1409  ? CREATININE 0.66 (L) 07/31/2021 1340  ? CALCIUM 9.4 07/31/2021 1340  ? GFRNONAA 94 12/18/2017 1137  ? GFRAA 109 12/18/2017 1137  ? ? ?Lipid Panel  ?   ?Component Value Date/Time  ? CHOL 147 07/31/2021 1340  ? CHOL 217 (H) 12/23/2015 1606  ? TRIG 62 07/31/2021 1340  ? HDL 42 07/31/2021 1340  ? HDL 29 (L) 12/23/2015 1606  ? CHOLHDL 3.5 07/31/2021 1340  ? [place] 91 07/31/2021 1340  ? ? ?CBC ?   ?Component Value Date/Time  ? WBC 9.3  07/31/2021 1340  ? RBC 4.72 07/31/2021 1340  ? HGB 15.2 07/31/2021 1340  ? HGB 12.9 (L) 11/14/2020 1409  ? HCT 45.9 07/31/2021 1340  ? HCT 40.0 11/14/2020 1409  ? PLT 220 07/31/2021 1340  ? PLT 287 11/14/2020 1409  ? MCV 97.2 11/28/202

## 2021-12-14 NOTE — Assessment & Plan Note (Signed)
PSA today ?Rx for Flomax 0.4 mg daily ?

## 2021-12-15 LAB — VITAMIN B12: Vitamin B-12: 506 pg/mL (ref 200–1100)

## 2021-12-15 LAB — HEMOGLOBIN A1C
Hgb A1c MFr Bld: 5.8 % of total Hgb — ABNORMAL HIGH (ref <5.7)
Mean Plasma Glucose: 120 mg/dL
eAG (mmol/L): 6.6 mmol/L

## 2021-12-15 LAB — PSA: PSA: 4.1 ng/mL — ABNORMAL HIGH (ref <=4.00)

## 2021-12-15 LAB — VITAMIN D 25 HYDROXY (VIT D DEFICIENCY, FRACTURES): Vit D, 25-Hydroxy: 39 ng/mL (ref 30–100)

## 2021-12-15 LAB — MAGNESIUM: Magnesium: 2 mg/dL (ref 1.5–2.5)

## 2021-12-18 ENCOUNTER — Telehealth: Payer: Self-pay | Admitting: Internal Medicine

## 2021-12-18 NOTE — Telephone Encounter (Signed)
Copied from [place] [id number]. Topic: General - Other ?>> Dec 18, 2021 12:02 PM Tessa Lerner A wrote: ?Reason for CRM: Amber with [facility] would like to speak with a member of clinical staff when possible ? ?Amber has additional questions related to the patient's prescription of ? ?Fluticasone-Umeclidin-Vilant (TRELEGY ELLIPTA) 100-62.5-25 MCG/INH AEPB [[id number]] ? ?gabapentin (NEURONTIN) 400 MG capsule [[id number]]  ? ?Multivitamin tablets ? ?Please contact further when available ?

## 2021-12-19 NOTE — Telephone Encounter (Signed)
I spoke with the pharmacist at [facility].  They did not need Gapapentin '600mg'$  that has been dispensed.  I did give a verbal refill for Trelegy Ellipta.   ? ? ?Thanks,  ? ?-Mickel Baas   ?

## 2021-12-20 DIAGNOSIS — H2513 Age-related nuclear cataract, bilateral: Secondary | ICD-10-CM | POA: Diagnosis not present

## 2021-12-20 DIAGNOSIS — H524 Presbyopia: Secondary | ICD-10-CM | POA: Diagnosis not present

## 2022-01-04 DIAGNOSIS — H2513 Age-related nuclear cataract, bilateral: Secondary | ICD-10-CM | POA: Diagnosis not present

## 2022-01-04 DIAGNOSIS — H35372 Puckering of macula, left eye: Secondary | ICD-10-CM | POA: Diagnosis not present

## 2022-01-15 ENCOUNTER — Other Ambulatory Visit: Payer: Self-pay | Admitting: Internal Medicine

## 2022-01-17 NOTE — Telephone Encounter (Signed)
Requested Prescriptions  ?Pending Prescriptions Disp Refills  ?? sildenafil (VIAGRA) 50 MG tablet [Pharmacy Med Name: SILDENAFIL CITRATE 50 MG Tablet] 30 tablet 0  ?  Sig: TAKE 1 TABLET EVERY DAY AS NEEDED FOR ERECTILE DYSFUNCTION  ?  ? Urology: Erectile Dysfunction Agents Failed - 01/15/2022  4:18 PM  ?  ?  Failed - Last BP in normal range  ?  BP Readings from Last 1 Encounters:  ?12/14/21 (!) 148/92  ?   ?  ?  Passed - AST in normal range and within 360 days  ?  AST  ?Date Value Ref Range Status  ?07/31/2021 21 10 - 35 U/L Final  ?   ?  ?  Passed - ALT in normal range and within 360 days  ?  ALT  ?Date Value Ref Range Status  ?07/31/2021 26 9 - 46 U/L Final  ?   ?  ?  Passed - Valid encounter within last 12 months  ?  Recent Outpatient Visits   ?      ? 1 month ago Peripheral polyneuropathy  ? [facility] [place], [place] W, NP  ? 5 months ago Mixed hyperlipidemia  ? [facility] [place], [place] W, NP  ? 1 year ago HFrEF (heart failure with reduced ejection fraction) ([place])  ? [facility] [place], [place] W, NP  ? 1 year ago Bilateral lower extremity edema  ? [place], DO  ? 1 year ago HFrEF (heart failure with reduced ejection fraction) ([place])  ? [facility] [place], [place], [place]  ?  ?  ? ?  ?  ?  ? ?

## 2022-02-08 ENCOUNTER — Other Ambulatory Visit: Payer: Self-pay | Admitting: Internal Medicine

## 2022-02-08 NOTE — Telephone Encounter (Signed)
Refills available. LRF 12/14/21  #90 1 refill Requested Prescriptions  Pending Prescriptions Disp Refills  . DULoxetine (CYMBALTA) 60 MG capsule [Pharmacy Med Name: DULoxetine HCL DR 60 MG CAPSULE] 90 capsule 1    Sig: TAKE ONE CAPSULE BY MOUTH DAILY     Psychiatry: Antidepressants - SNRI - duloxetine Failed - 02/08/2022  6:17 AM      Failed - Cr in normal range and within 360 days    Creat  Date Value Ref Range Status  07/31/2021 0.66 (L) 0.70 - 1.35 mg/dL Final         Failed - Last BP in normal range    BP Readings from Last 1 Encounters:  12/14/21 (!) 148/92         Passed - eGFR is 30 or above and within 360 days    GFR, Est African American  Date Value Ref Range Status  12/18/2017 109 > OR = 60 mL/min/1.70m Final   GFR, Est Non African American  Date Value Ref Range Status  12/18/2017 94 > OR = 60 mL/min/1.773mFinal   eGFR  Date Value Ref Range Status  07/31/2021 105 > OR = 60 mL/min/1.7319minal    Comment:    The eGFR is based on the CKD-EPI 2021 equation. To calculate  the new eGFR from a previous Creatinine or Cystatin C result, go to https://www.kidney.org/professionals/ kdoqi/gfr%5Fcalculator   11/14/2020 105 >59 mL/min/1.73 Final         Passed - Completed PHQ-2 or PHQ-9 in the last 360 days      Passed - Valid encounter within last 6 months    Recent Outpatient Visits          1 month ago [facility]ipheral polyneuropathy   [facility]   6 months ago Mixed hyperlipidemia   [facility]   1 year ago HFrEF (heart failure with reduced ejection fraction) ([facility]   1 year ago Bilateral lower extremity edema   [place]   1 year ago HFrEF (heart failure with reduced ejection fraction) ([facility] [place]

## 2022-02-23 ENCOUNTER — Other Ambulatory Visit: Payer: Self-pay | Admitting: Internal Medicine

## 2022-02-27 DIAGNOSIS — I77810 Thoracic aortic ectasia: Secondary | ICD-10-CM | POA: Diagnosis not present

## 2022-02-27 DIAGNOSIS — I739 Peripheral vascular disease, unspecified: Secondary | ICD-10-CM | POA: Diagnosis not present

## 2022-02-27 DIAGNOSIS — I7121 Aneurysm of the ascending aorta, without rupture: Secondary | ICD-10-CM | POA: Diagnosis not present

## 2022-02-27 DIAGNOSIS — I728 Aneurysm of other specified arteries: Secondary | ICD-10-CM | POA: Diagnosis not present

## 2022-02-27 DIAGNOSIS — I7143 Infrarenal abdominal aortic aneurysm, without rupture: Secondary | ICD-10-CM | POA: Diagnosis not present

## 2022-02-27 DIAGNOSIS — Z01818 Encounter for other preprocedural examination: Secondary | ICD-10-CM | POA: Diagnosis not present

## 2022-02-27 DIAGNOSIS — J439 Emphysema, unspecified: Secondary | ICD-10-CM | POA: Diagnosis not present

## 2022-02-27 DIAGNOSIS — I5020 Unspecified systolic (congestive) heart failure: Secondary | ICD-10-CM | POA: Diagnosis not present

## 2022-02-27 DIAGNOSIS — I7123 Aneurysm of the descending thoracic aorta, without rupture: Secondary | ICD-10-CM | POA: Diagnosis not present

## 2022-02-27 DIAGNOSIS — F17210 Nicotine dependence, cigarettes, uncomplicated: Secondary | ICD-10-CM | POA: Diagnosis not present

## 2022-02-27 DIAGNOSIS — I714 Abdominal aortic aneurysm, without rupture, unspecified: Secondary | ICD-10-CM | POA: Diagnosis not present

## 2022-02-27 DIAGNOSIS — R143 Flatulence: Secondary | ICD-10-CM | POA: Diagnosis not present

## 2022-03-03 DIAGNOSIS — Z9841 Cataract extraction status, right eye: Secondary | ICD-10-CM

## 2022-03-03 HISTORY — DX: Cataract extraction status, right eye: Z98.41

## 2022-03-07 DIAGNOSIS — I69398 Other sequelae of cerebral infarction: Secondary | ICD-10-CM | POA: Diagnosis not present

## 2022-03-07 DIAGNOSIS — M1990 Unspecified osteoarthritis, unspecified site: Secondary | ICD-10-CM | POA: Diagnosis not present

## 2022-03-07 DIAGNOSIS — E785 Hyperlipidemia, unspecified: Secondary | ICD-10-CM | POA: Diagnosis not present

## 2022-03-07 DIAGNOSIS — J449 Chronic obstructive pulmonary disease, unspecified: Secondary | ICD-10-CM | POA: Diagnosis not present

## 2022-03-07 DIAGNOSIS — F17200 Nicotine dependence, unspecified, uncomplicated: Secondary | ICD-10-CM | POA: Diagnosis not present

## 2022-03-07 DIAGNOSIS — H2511 Age-related nuclear cataract, right eye: Secondary | ICD-10-CM | POA: Diagnosis not present

## 2022-03-07 DIAGNOSIS — I10 Essential (primary) hypertension: Secondary | ICD-10-CM | POA: Diagnosis not present

## 2022-03-07 DIAGNOSIS — I2510 Atherosclerotic heart disease of native coronary artery without angina pectoris: Secondary | ICD-10-CM | POA: Diagnosis not present

## 2022-03-07 DIAGNOSIS — E1136 Type 2 diabetes mellitus with diabetic cataract: Secondary | ICD-10-CM | POA: Diagnosis not present

## 2022-03-21 DIAGNOSIS — Z79899 Other long term (current) drug therapy: Secondary | ICD-10-CM | POA: Diagnosis not present

## 2022-03-21 DIAGNOSIS — H259 Unspecified age-related cataract: Secondary | ICD-10-CM | POA: Diagnosis not present

## 2022-03-21 DIAGNOSIS — H2512 Age-related nuclear cataract, left eye: Secondary | ICD-10-CM | POA: Diagnosis not present

## 2022-03-21 DIAGNOSIS — I10 Essential (primary) hypertension: Secondary | ICD-10-CM | POA: Diagnosis not present

## 2022-03-21 DIAGNOSIS — I2510 Atherosclerotic heart disease of native coronary artery without angina pectoris: Secondary | ICD-10-CM | POA: Diagnosis not present

## 2022-03-21 DIAGNOSIS — E785 Hyperlipidemia, unspecified: Secondary | ICD-10-CM | POA: Diagnosis not present

## 2022-03-21 DIAGNOSIS — E1136 Type 2 diabetes mellitus with diabetic cataract: Secondary | ICD-10-CM | POA: Diagnosis not present

## 2022-03-21 DIAGNOSIS — J449 Chronic obstructive pulmonary disease, unspecified: Secondary | ICD-10-CM | POA: Diagnosis not present

## 2022-03-21 DIAGNOSIS — I4891 Unspecified atrial fibrillation: Secondary | ICD-10-CM | POA: Diagnosis not present

## 2022-03-21 DIAGNOSIS — Z8673 Personal history of transient ischemic attack (TIA), and cerebral infarction without residual deficits: Secondary | ICD-10-CM | POA: Diagnosis not present

## 2022-05-08 DIAGNOSIS — Z0100 Encounter for examination of eyes and vision without abnormal findings: Secondary | ICD-10-CM | POA: Diagnosis not present

## 2022-05-10 ENCOUNTER — Other Ambulatory Visit: Payer: Self-pay | Admitting: Internal Medicine

## 2022-05-10 NOTE — Telephone Encounter (Signed)
Requested medication (s) are due for refill today: yes  Requested medication (s) are on the active medication list: yes  Last refill:  Omep 02/23/22 (historical provider), Trelegy 2/28 #90 and 1 RF, Metoprolol 12/14/21 #90 and 1 RF, Duloxetine 12/14/21 #90 with 1 RF, Flomax 12/14/21 #90 with 1 RF, Liptitor 12/14/21 #90 with 1 RD   Future visit scheduled: no, last seen 10/31/21  Notes to clinic:  Trelegy is from a historical provider and no protocol, Lanoxin does not have an EKG or dig level. Last visit was 12/14/21 and he was asked to return in 2 months, no upcoming appt scheduled, please assess.    Requested Prescriptions  Pending Prescriptions Disp Refills   omeprazole (PRILOSEC) 20 MG capsule [Pharmacy Med Name: OMEPRAZOLE 20 MG Capsule Delayed Release] 90 capsule 1    Sig: TAKE 1 CAPSULE EVERY DAY     Gastroenterology: Proton Pump Inhibitors Passed - 05/10/2022  3:24 AM      Passed - Valid encounter within last 12 months    Recent Outpatient Visits           4 months ago Peripheral polyneuropathy   [facility] [place], Coralie Keens, NP   9 months ago Mixed hyperlipidemia   [facility] [place], [place] W, NP   1 year ago HFrEF (heart failure with reduced ejection fraction) [facility])   [facility], Coralie Keens, NP   1 year ago Bilateral lower extremity edema   [place], DO   1 year ago HFrEF (heart failure with reduced ejection fraction) [facility])   [facility], PA-C               TRELEGY ELLIPTA 100-62.5-25 MCG/ACT AEPB [Pharmacy Med Name: Donnal Debar 100-62.5-25 MCG/ACT Aerosol Powder Breath Activated] 180 each     Sig: INHALE 1 PUFF EVERY DAY     Off-Protocol Failed - 05/10/2022  3:24 AM      Failed - Medication not assigned to a protocol, review manually.      Passed - Valid encounter within last 12 months    Recent Outpatient Visits           4 months  ago Peripheral polyneuropathy   [facility] [place], Coralie Keens, NP   9 months ago Mixed hyperlipidemia   [facility] [place], [place] W, NP   1 year ago HFrEF (heart failure with reduced ejection fraction) [facility])   [facility], Coralie Keens, NP   1 year ago Bilateral lower extremity edema   [place], Devonne Doughty, DO   1 year ago HFrEF (heart failure with reduced ejection fraction) [facility])   [facility], Adriana M, PA-C               metoprolol succinate (TOPROL-XL) 25 MG 24 hr tablet [Pharmacy Med Name: METOPROLOL SUCCINATE ER 25 MG Tablet Extended Release 24 Hour] 90 tablet 1    Sig: TAKE 1 TABLET EVERY DAY     Cardiovascular:  Beta Blockers Failed - 05/10/2022  3:24 AM      Failed - Last BP in normal range    BP Readings from Last 1 Encounters:  12/14/21 (!) 148/92         Passed - Last Heart Rate in normal range    Pulse Readings from Last 1 Encounters:  12/14/21 88  Passed - Valid encounter within last 6 months    Recent Outpatient Visits           4 months ago Peripheral polyneuropathy   [facility] [place], [place], NP   9 months ago Mixed hyperlipidemia   [facility] [place], Coralie Keens, NP   1 year ago HFrEF (heart failure with reduced ejection fraction) [facility])   [facility], Coralie Keens, NP   1 year ago Bilateral lower extremity edema   [facility] [place], Devonne Doughty, DO   1 year ago HFrEF (heart failure with reduced ejection fraction) [facility])   [facility], Adriana M, PA-C               DULoxetine (CYMBALTA) 60 MG capsule [Pharmacy Med Name: DULOXETINE HYDROCHLORIDE 60 MG Capsule Delayed Release Particles] 90 capsule 1    Sig: TAKE 1 CAPSULE EVERY DAY     Psychiatry: Antidepressants - SNRI - duloxetine Failed - 05/10/2022  3:24 AM      Failed - Cr in normal range and  within 360 days    Creat  Date Value Ref Range Status  07/31/2021 0.66 (L) 0.70 - 1.35 mg/dL Final         Failed - Last BP in normal range    BP Readings from Last 1 Encounters:  12/14/21 (!) 148/92         Passed - eGFR is 30 or above and within 360 days    GFR, Est African American  Date Value Ref Range Status  12/18/2017 109 > OR = 60 mL/min/1.74m Final   GFR, Est Non African American  Date Value Ref Range Status  12/18/2017 94 > OR = 60 mL/min/1.722mFinal   eGFR  Date Value Ref Range Status  07/31/2021 105 > OR = 60 mL/min/1.738minal    Comment:    The eGFR is based on the CKD-EPI 2021 equation. To calculate  the new eGFR from a previous Creatinine or Cystatin C result, go to https://www.kidney.org/professionals/ kdoqi/gfr%5Fcalculator   11/14/2020 105 >59 mL/min/1.73 Final         Passed - Completed PHQ-2 or PHQ-9 in the last 360 days      Passed - Valid encounter within last 6 months    Recent Outpatient Visits           4 months ago Peripheral polyneuropathy   [facility]   9 months ago Mixed hyperlipidemia   [facility] [facility]   1 year ago HFrEF (heart failure with reduced ejection fraction) ([facility] KeensP   1 year ago Bilateral lower extremity edema   [facility]   1 year ago HFrEF (heart failure with reduced ejection fraction) ([facility] [place]            tamsulosin (FLOMAX) 0.4 MG CAPS capsule [Pharmacy Med Name: TAMSULOSIN HYDROCHLORIDE 0.4 MG Capsule] 90 capsule 1    Sig: TAKE 1 CAPWickliffe  Urology: Alpha-Adrenergic Blocker Failed - 05/10/2022  3:24 AM      Failed - PSA in normal range and within 360 days    PSA  Date Value Ref Range Status  12/14/2021 4.10 (H) < OR = 4.00 ng/mL Final  Comment:    The total PSA value from this assay  system is  standardized against the WHO standard. The test  result will be approximately 20% lower when compared  to the equimolar-standardized total PSA (Beckman  Coulter). Comparison of serial PSA results should be  interpreted with this fact in mind. . This test was performed using the Siemens  chemiluminescent method. Values obtained from  different assay methods cannot be used interchangeably. PSA levels, regardless of value, should not be interpreted as absolute evidence of the presence or absence of disease.    Prostate Specific Ag, Serum  Date Value Ref Range Status  12/23/2015 3.8 0.0 - 4.0 ng/mL Final    Comment:    Roche ECLIA methodology. According to the American Urological Association, Serum PSA should decrease and remain at undetectable levels after radical prostatectomy. The AUA defines biochemical recurrence as an initial PSA value 0.2 ng/mL or greater followed by a subsequent confirmatory PSA value 0.2 ng/mL or greater. Values obtained with different assay methods or kits cannot be used interchangeably. Results cannot be interpreted as absolute evidence of the presence or absence of malignant disease.          Failed - Last BP in normal range    BP Readings from Last 1 Encounters:  12/14/21 (!) 148/92         Passed - Valid encounter within last 12 months    Recent Outpatient Visits           4 months ago Peripheral polyneuropathy   [facility] [place], Coralie Keens, NP   9 months ago Mixed hyperlipidemia   [facility] [place], Coralie Keens, NP   1 year ago HFrEF (heart failure with reduced ejection fraction) [facility])   [facility], Coralie Keens, NP   1 year ago Bilateral lower extremity edema   [facility] [place] Hauser, DO   1 year ago HFrEF (heart failure with reduced ejection fraction) [facility])   [facility], [place], [place]               digoxin (LANOXIN)  0.25 MG tablet [Pharmacy Med Name: DIGOXIN 250 MCG Tablet] 90 tablet 1    Sig: TAKE 1 TABLET EVERY DAY     Cardiovascular:  Antiarrhythmic Agents - digoxin Failed - 05/10/2022  3:24 AM      Failed - Cr in normal range and within 180 days    Creat  Date Value Ref Range Status  07/31/2021 0.66 (L) 0.70 - 1.35 mg/dL Final         Failed - Digoxin (serum) in normal range and within 360 days    No results found for: "DIGOXIN"       Failed - K in normal range and within 180 days    Potassium  Date Value Ref Range Status  07/31/2021 4.6 3.5 - 5.3 mmol/L Final         Failed - Patient had ECG in the last 360 days      Passed - Ca in normal range and within 360 days    Calcium  Date Value Ref Range Status  07/31/2021 9.4 8.6 - 10.3 mg/dL Final         Passed - Mg Level in normal range and within 360 days    Magnesium  Date Value Ref Range Status  12/14/2021 2.0 1.5 - 2.5 mg/dL Final  Passed - Last Heart Rate in normal range    Pulse Readings from Last 1 Encounters:  12/14/21 88         Passed - Valid encounter within last 6 months    Recent Outpatient Visits           4 months ago Peripheral polyneuropathy   [facility] [place], Coralie Keens, NP   9 months ago Mixed hyperlipidemia   [facility] [place], [place] W, NP   1 year ago HFrEF (heart failure with reduced ejection fraction) [facility])   [facility], Coralie Keens, NP   1 year ago Bilateral lower extremity edema   [facility] [place], Devonne Doughty, DO   1 year ago HFrEF (heart failure with reduced ejection fraction) [facility])   [facility], Adriana M, PA-C               atorvastatin (LIPITOR) 80 MG tablet [Pharmacy Med Name: ATORVASTATIN CALCIUM 80 MG Tablet] 90 tablet 1    Sig: TAKE 1 TABLET EVERY DAY     Cardiovascular:  Antilipid - Statins Failed - 05/10/2022  3:24 AM      Failed - Lipid Panel in normal range within the last  12 months    Cholesterol, Total  Date Value Ref Range Status  12/23/2015 217 (H) 100 - 199 mg/dL Final   Cholesterol  Date Value Ref Range Status  07/31/2021 147 <200 mg/dL Final   LDL Cholesterol (Calc)  Date Value Ref Range Status  07/31/2021 91 mg/dL (calc) Final    Comment:    Reference range: <100 . Desirable range <100 mg/dL for primary prevention;   <70 mg/dL for patients with CHD or diabetic patients  with > or = 2 CHD risk factors. [name] LDL-C is now calculated using the Martin-Hopkins  calculation, which is a validated novel method providing  better accuracy than the Friedewald equation in the  estimation of LDL-C.  Cresenciano Genre et al. Annamaria Helling. 3875;643(32): 2061-2068  (http://education.Quest[employer]ostics.com/faq/FAQ164)    HDL  Date Value Ref Range Status  07/31/2021 42 > OR = 40 mg/dL Final  12/23/2015 29 (L) >39 mg/dL Final   Triglycerides  Date Value Ref Range Status  07/31/2021 62 <150 mg/dL Final         Passed - Patient is not pregnant      Passed - Valid encounter within last 12 months    Recent Outpatient Visits           4 months ago Peripheral polyneuropathy   [facility] [place], Coralie Keens, NP   9 months ago Mixed hyperlipidemia   [facility] [place], [place] W, NP   1 year ago HFrEF (heart failure with reduced ejection fraction) [facility])   [facility], Coralie Keens, NP   1 year ago Bilateral lower extremity edema   [place], DO   1 year ago HFrEF (heart failure with reduced ejection fraction) [facility])   [facility] [place], Fabio Bering [place], [place]

## 2022-05-19 ENCOUNTER — Other Ambulatory Visit: Payer: Self-pay | Admitting: Internal Medicine

## 2022-05-19 DIAGNOSIS — G629 Polyneuropathy, unspecified: Secondary | ICD-10-CM

## 2022-05-21 NOTE — Telephone Encounter (Signed)
Requested Prescriptions  Pending Prescriptions Disp Refills  . JARDIANCE 10 MG TABS tablet [Pharmacy Med Name: JARDIANCE 10 MG Tablet] 90 tablet 0    Sig: TAKE 1 TABLET EVERY Emily     Endocrinology:  Diabetes - SGLT2 Inhibitors Failed - 05/19/2022  3:18 AM      Failed - Cr in normal range and within 360 days    Creat  Date Value Ref Range Status  07/31/2021 0.66 (L) 0.70 - 1.35 mg/dL Final         Passed - HBA1C is between 0 and 7.9 and within 180 days    Hgb A1c MFr Bld  Date Value Ref Range Status  12/14/2021 5.8 (H) <5.7 % of total Hgb Final    Comment:    For someone without known diabetes, a hemoglobin  A1c value between 5.7% and 6.4% is consistent with prediabetes and should be confirmed with a  follow-up test. . For someone with known diabetes, a value <7% indicates that their diabetes is well controlled. A1c targets should be individualized based on duration of diabetes, age, comorbid conditions, and other considerations. . This assay result is consistent with an increased risk of diabetes. . Currently, no consensus exists regarding use of hemoglobin A1c for diagnosis of diabetes for children. .          Passed - eGFR in normal range and within 360 days    GFR, Est African American  Date Value Ref Range Status  12/18/2017 109 > OR = 60 mL/min/1.84m Final   GFR, Est Non African American  Date Value Ref Range Status  12/18/2017 94 > OR = 60 mL/min/1.773mFinal   eGFR  Date Value Ref Range Status  07/31/2021 105 > OR = 60 mL/min/1.7328minal    Comment:    The eGFR is based on the CKD-EPI 2021 equation. To calculate  the new eGFR from a previous Creatinine or Cystatin C result, go to https://www.kidney.org/professionals/ kdoqi/gfr%5Fcalculator   11/14/2020 105 >59 mL/min/1.73 Final         Passed - Valid encounter within last 6 months    Recent Outpatient Visits          5 months ago Peripheral polyneuropathy   [facility]   9 months ago Mixed hyperlipidemia   [facility] [facility]   1 year ago [facility] with reduced ejection fraction[facility]   1 year ago Bilateral lower extremity edema   [facility]   1 year ago [facility] failure with reduced ejection fraction) [facility]             . ELIQUIS 5 MG TABS tablet [Pharmacy Med Name: ELIQUIS 5 MG Tablet] 180 tablet 0    Sig: TAKE 1 TABLET TWICE DAILY     Hematology:  Anticoagulants - apixaban Failed - 05/19/2022  3:18 AM      Failed - Cr in normal range and within 360 days    Creat  Date Value Ref Range Status  07/31/2021 0.66 (L) 0.70 - 1.35 mg/dL Final         Passed - PLT in normal range and within 360 days    Platelets  Date Value Ref Range Status  07/31/2021 220 140 - 400 Thousand/uL Final  11/14/2020 287 150 -  450 x10E3/uL Final         Passed - HGB in normal range and within 360 days    Hemoglobin  Date Value Ref Range Status  07/31/2021 15.2 13.2 - 17.1 g/dL Final  11/14/2020 12.9 (L) 13.0 - 17.7 g/dL Final         Passed - HCT in normal range and within 360 days    HCT  Date Value Ref Range Status  07/31/2021 45.9 38.5 - 50.0 % Final   Hematocrit  Date Value Ref Range Status  11/14/2020 40.0 37.5 - 51.0 % Final         Passed - AST in normal range and within 360 days    AST  Date Value Ref Range Status  07/31/2021 21 10 - 35 U/L Final         Passed - ALT in normal range and within 360 days    ALT  Date Value Ref Range Status  07/31/2021 26 9 - 46 U/L Final         Passed - Valid encounter within last 12 months    Recent Outpatient Visits          5 months ago Peripheral polyneuropathy   [place], Coralie Keens, NP   9 months ago Mixed hyperlipidemia   [place],  [place] W, NP   1 year ago HFrEF (heart failure with reduced ejection fraction) [facility])   [facility], Coralie Keens, NP   1 year ago Bilateral lower extremity edema   [place], DO   1 year ago HFrEF (heart failure with reduced ejection fraction) [facility])   [facility] [place], Fabio Bering [place], [place]

## 2022-05-23 ENCOUNTER — Other Ambulatory Visit: Payer: Self-pay

## 2022-05-23 ENCOUNTER — Telehealth: Payer: Self-pay

## 2022-05-23 MED ORDER — TRELEGY ELLIPTA 100-62.5-25 MCG/ACT IN AEPB: 1.0000 | INHALATION_SPRAY | Freq: Every day | RESPIRATORY_TRACT | 0 refills | Status: DC

## 2022-05-23 NOTE — Telephone Encounter (Signed)
He is past due for an appt for his physical. Have him schedule this and we can discuss that at his appt.

## 2022-05-23 NOTE — Telephone Encounter (Signed)
Copied from [place] [id number]. Topic: General - Inquiry >> May 21, 2022 10:34 AM Penni Bombard wrote: Reason for CRM: Pt wants to talk to [facility] about prescribing Chantex to help him quit smoking.  CB#  [phone]

## 2022-05-24 NOTE — Telephone Encounter (Signed)
Pt schedule for 09/29  Thanks,   -Mickel Baas

## 2022-05-31 ENCOUNTER — Encounter: Payer: Self-pay | Admitting: Internal Medicine

## 2022-06-01 ENCOUNTER — Encounter: Payer: Self-pay | Admitting: Internal Medicine

## 2022-06-01 ENCOUNTER — Ambulatory Visit (INDEPENDENT_AMBULATORY_CARE_PROVIDER_SITE_OTHER): Payer: Medicare HMO | Admitting: Internal Medicine

## 2022-06-01 VITALS — BP 112/80 | HR 69 | Temp 96.8°F | Ht 74.0 in | Wt 164.0 lb

## 2022-06-01 DIAGNOSIS — J449 Chronic obstructive pulmonary disease, unspecified: Secondary | ICD-10-CM | POA: Diagnosis not present

## 2022-06-01 DIAGNOSIS — I70213 Atherosclerosis of native arteries of extremities with intermittent claudication, bilateral legs: Secondary | ICD-10-CM

## 2022-06-01 DIAGNOSIS — I482 Chronic atrial fibrillation, unspecified: Secondary | ICD-10-CM | POA: Diagnosis not present

## 2022-06-01 DIAGNOSIS — I5020 Unspecified systolic (congestive) heart failure: Secondary | ICD-10-CM | POA: Diagnosis not present

## 2022-06-01 DIAGNOSIS — E782 Mixed hyperlipidemia: Secondary | ICD-10-CM

## 2022-06-01 DIAGNOSIS — R7303 Prediabetes: Secondary | ICD-10-CM | POA: Diagnosis not present

## 2022-06-01 MED ORDER — VARENICLINE TARTRATE 0.5 MG PO TABS: 0.5000 mg | ORAL_TABLET | Freq: Two times a day (BID) | ORAL | 0 refills | Status: DC

## 2022-06-01 NOTE — Patient Instructions (Signed)
Health Maintenance, Male Adopting a healthy lifestyle and getting preventive care are important in promoting health and wellness. Ask your health care provider about: The right schedule for you to have regular tests and exams. Things you can do on your own to prevent diseases and keep yourself healthy. What should I know about diet, weight, and exercise? Eat a healthy diet  Eat a diet that includes plenty of vegetables, fruits, low-fat dairy products, and lean protein. Do not eat a lot of foods that are high in solid fats, added sugars, or sodium. Maintain a healthy weight Body mass index (BMI) is a measurement that can be used to identify possible weight problems. It estimates body fat based on height and weight. Your health care provider can help determine your BMI and help you achieve or maintain a healthy weight. Get regular exercise Get regular exercise. This is one of the most important things you can do for your health. Most adults should: Exercise for at least 150 minutes each week. The exercise should increase your heart rate and make you sweat (moderate-intensity exercise). Do strengthening exercises at least twice a week. This is in addition to the moderate-intensity exercise. Spend less time sitting. Even light physical activity can be beneficial. Watch cholesterol and blood lipids Have your blood tested for lipids and cholesterol at 64 years of age, then have this test every 5 years. You may need to have your cholesterol levels checked more often if: Your lipid or cholesterol levels are high. You are older than 64 years of age. You are at high risk for heart disease. What should I know about cancer screening? Many types of cancers can be detected early and may often be prevented. Depending on your health history and family history, you may need to have cancer screening at various ages. This may include screening for: Colorectal cancer. Prostate cancer. Skin cancer. Lung  cancer. What should I know about heart disease, diabetes, and high blood pressure? Blood pressure and heart disease High blood pressure causes heart disease and increases the risk of stroke. This is more likely to develop in people who have high blood pressure readings or are overweight. Talk with your health care provider about your target blood pressure readings. Have your blood pressure checked: Every 3-5 years if you are 18-39 years of age. Every year if you are 40 years old or older. If you are between the ages of 65 and 75 and are a current or former smoker, ask your health care provider if you should have a one-time screening for abdominal aortic aneurysm (AAA). Diabetes Have regular diabetes screenings. This checks your fasting blood sugar level. Have the screening done: Once every three years after age 45 if you are at a normal weight and have a low risk for diabetes. More often and at a younger age if you are overweight or have a high risk for diabetes. What should I know about preventing infection? Hepatitis B If you have a higher risk for hepatitis B, you should be screened for this virus. Talk with your health care provider to find out if you are at risk for hepatitis B infection. Hepatitis C Blood testing is recommended for: Everyone born from 1945 through 1965. Anyone with known risk factors for hepatitis C. Sexually transmitted infections (STIs) You should be screened each year for STIs, including gonorrhea and chlamydia, if: You are sexually active and are younger than 64 years of age. You are older than 64 years of age and your   health care provider tells you that you are at risk for this type of infection. Your sexual activity has changed since you were last screened, and you are at increased risk for chlamydia or gonorrhea. Ask your health care provider if you are at risk. Ask your health care provider about whether you are at high risk for HIV. Your health care provider  may recommend a prescription medicine to help prevent HIV infection. If you choose to take medicine to prevent HIV, you should first get tested for HIV. You should then be tested every 3 months for as long as you are taking the medicine. Follow these instructions at home: Alcohol use Do not drink alcohol if your health care provider tells you not to drink. If you drink alcohol: Limit how much you have to 0-2 drinks a day. Know how much alcohol is in your drink. In the [place] one drink equals one 12 oz bottle of beer (355 mL), one 5 oz glass of wine (148 mL), or one 1 oz glass of hard liquor (44 mL). Lifestyle Do not use any products that contain nicotine or tobacco. These products include cigarettes, chewing tobacco, and vaping devices, such as e-cigarettes. If you need help quitting, ask your health care provider. Do not use street drugs. Do not share needles. Ask your health care provider for help if you need support or information about quitting drugs. General instructions Schedule regular health, dental, and eye exams. Stay current with your vaccines. Tell your health care provider if: You often feel depressed. You have ever been abused or do not feel safe at home. Summary Adopting a healthy lifestyle and getting preventive care are important in promoting health and wellness. Follow your health care provider's instructions about healthy diet, exercising, and getting tested or screened for diseases. Follow your health care provider's instructions on monitoring your cholesterol and blood pressure. This information is not intended to replace advice given to you by your health care provider. Make sure you discuss any questions you have with your health care provider. Document Revised: 01/09/2021 Document Reviewed: 01/09/2021 Elsevier Patient Education  2023 Elsevier Inc.  

## 2022-06-01 NOTE — Assessment & Plan Note (Signed)
We will monitor

## 2022-06-01 NOTE — Assessment & Plan Note (Signed)
We will trial Chantix for smoking cessation

## 2022-06-01 NOTE — Progress Notes (Signed)
Subjective:    Patient ID: Ricardo Folks Sr., male    DOB: [birth year], 66 y.o.   MRN: [id number]  HPI  Patient presents to clinic today for his annual exam.  Flu: 09/2020 Tetanus: > 10 years ago COVID: X2 Pneumovax: Never Shingrix: Never PSA screening: 12/2021 Colon screening: never Vision screening: as needed Dentist:as needed  Diet: He does eat meat. He occasionally eats fruits and veggies. He does eat fried foods. He drinks mostly sweet tea. Exercise: None  Review of Systems  Past Medical History:  Diagnosis Date   Arthritis    Rheumatoid   Bipolar disorder ([place])    Complication of anesthesia    difficulty keeping patient asleep. fentanyl makes patient mean, grumpy and difficult   Depression    Fusion of spine    GERD (gastroesophageal reflux disease)    slightly uncomfortable. takes otc antacids occasionally   Headache    deteriorating discs causing headaches. uses advil   Hepatitis C    HEP "C". treated 2 years ago   History of kidney stones 2016   Hyperlipidemia    Pericarditis    20 years ago   Peripheral vascular disease ([place])    Smoker     Current Outpatient Medications  Medication Sig Dispense Refill   albuterol (VENTOLIN HFA) 108 (90 Base) MCG/ACT inhaler      atorvastatin (LIPITOR) 80 MG tablet Take 1 tablet (80 mg total) by mouth daily. 90 tablet 1   digoxin (LANOXIN) 0.25 MG tablet Take 1 tablet (250 mcg total) by mouth daily. 90 tablet 1   DULoxetine (CYMBALTA) 60 MG capsule Take 1 capsule (60 mg total) by mouth daily. 90 capsule 1   ELIQUIS 5 MG TABS tablet TAKE 1 TABLET TWICE DAILY 180 tablet 0   Fluticasone-Umeclidin-Vilant (TRELEGY ELLIPTA) 100-62.5-25 MCG/ACT AEPB Inhale 1 puff into the lungs daily. 3 each 0   gabapentin (NEURONTIN) 600 MG tablet Take 1 tablet (600 mg total) by mouth 3 (three) times daily. 270 tablet 1   JARDIANCE 10 MG TABS tablet TAKE 1 TABLET EVERY DAY BEFORE BREAKFAST 90 tablet 0   metoprolol succinate (TOPROL-XL) 25  MG 24 hr tablet Take 1 tablet (25 mg total) by mouth daily. 90 tablet 1   omeprazole (PRILOSEC) 20 MG capsule TAKE ONE CAPSULE BY MOUTH DAILY 90 capsule 1   sildenafil (VIAGRA) 50 MG tablet TAKE 1 TABLET EVERY DAY AS NEEDED FOR ERECTILE DYSFUNCTION 30 tablet 0   tamsulosin (FLOMAX) 0.4 MG CAPS capsule Take 1 capsule (0.4 mg total) by mouth daily. 90 capsule 1   No current facility-administered medications for this visit.    Allergies  Allergen Reactions   Chantix [Varenicline] Other (See Comments)    Depression    Family History  Problem Relation Age of Onset   Cancer Mother        liver   Heart disease Father    Diabetes Father    Breast cancer Sister    Brain cancer Sister     Social History   Socioeconomic History   Marital status: Single    Spouse name: Not on file   Number of children: Not on file   Years of education: Not on file   Highest education level: Not on file  Occupational History   Not on file  Tobacco Use   Smoking status: Every Day    Packs/day: 0.50    Years: 40.00    Total pack years: 20.00    Types: Cigarettes  Smokeless tobacco: Never  Vaping Use   Vaping Use: Former  Substance and Sexual Activity   Alcohol use: Yes    Alcohol/week: 2.0 standard drinks of alcohol    Types: 2 Shots of liquor per week    Comment: occasional (every 3-4 weeks)    Drug use: No    Comment: polysubstance approx 2 years ago   Sexual activity: Not Currently  Other Topics Concern   Not on file  Social History Narrative   Not on file   Social Determinants of Health   Financial Resource Strain: Low Risk  (12/07/2021)   Overall Financial Resource Strain (CARDIA)    Difficulty of Paying Living Expenses: Not hard at all  Food Insecurity: No Food Insecurity (12/07/2021)   Hunger Vital Sign    Worried About Running Out of Food in the Last Year: Never true    Ran Out of Food in the Last Year: Never true  Transportation Needs: No Transportation Needs (12/07/2021)    PRAPARE - Hydrologist (Medical): No    Lack of Transportation (Non-Medical): No  Physical Activity: Insufficiently Active (12/07/2021)   Exercise Vital Sign    Days of Exercise per Week: 4 days    Minutes of Exercise per Session: 10 min  Stress: No Stress Concern Present (12/07/2021)   [place]    Feeling of Stress : Not at all  Social Connections: Moderately Integrated (12/07/2021)   Social Connection and Isolation Panel [NHANES]    Frequency of Communication with Friends and Family: More than three times a week    Frequency of Social Gatherings with Friends and Family: More than three times a week    Attends Religious Services: 1 to 4 times per year    Active Member of [employer] or Organizations: No    Attends Music therapist: 1 to 4 times per year    Marital Status: Never married  Intimate Partner Violence: Not on file     Constitutional: Denies fever, malaise, fatigue, headache or abrupt weight changes.  HEENT: Denies eye pain, eye redness, ear pain, ringing in the ears, wax buildup, runny nose, nasal congestion, bloody nose, or sore throat. Respiratory: Denies difficulty breathing, shortness of breath, cough or sputum production.   Cardiovascular: Denies chest pain, chest tightness, palpitations or swelling in the hands or feet.  Gastrointestinal: Denies abdominal pain, bloating, constipation, diarrhea or blood in the stool.  GU: Patient reports erectile dysfunction.  Denies urgency, frequency, pain with urination, burning sensation, blood in urine, odor or discharge. Musculoskeletal: Pt reports left calf pain with ambulation, chronic left foot pain. Denies decrease in range of motion, difficulty with gait, muscle pain or joint pain and swelling.  Skin: Denies redness, rashes, lesions or ulcercations.  Neurological: Patient reports neuropathic pain.  Denies dizziness, difficulty  with memory, difficulty with speech or problems with balance and coordination.  Psych: Denies anxiety, depression, SI/HI.  No other specific complaints in a complete review of systems (except as listed in HPI above).     Objective:   Physical Exam   BP 112/80 (BP Location: Left Arm, Patient Position: Sitting, Cuff Size: Normal)   Pulse 69   Temp (!) 96.8 F (36 C) (Temporal)   Ht 6' 2"  (1.88 m)   Wt 164 lb (74.4 kg)   SpO2 95%   BMI 21.06 kg/m   Wt Readings from Last 3 Encounters:  12/14/21 160 lb (72.6 kg)  07/31/21 151 lb (68.5 kg)  01/06/21 140 lb 12.8 oz (63.9 kg)    General: Appears his stated age, well developed, well nourished in NAD. Skin: Warm, dry and intact. Blue discoloration noted of fingers and toes. HEENT: Head: normal shape and size; Eyes: sclera white, no icterus, conjunctiva pink, PERRLA and EOMs intact;  Neck:  Neck supple, trachea midline. No masses, lumps or thyromegaly present.  Cardiovascular: Normal rate and rhythm. S1,S2 noted.  No murmur, rubs or gallops noted. No JVD or BLE edema. No carotid bruits noted. Pedal pulses 2+ bilaterally Pulmonary/Chest: Normal effort and positive vesicular breath sounds. No respiratory distress. No wheezes, rales or ronchi noted.  Abdomen: Normal bowel sounds.  Musculoskeletal: Strength 5/5 BUE/BLE. Gait slow and steady with use of cane. Neurological: Alert and oriented. Cranial nerves II-XII grossly intact. Coordination normal.  Psychiatric: Mood and affect normal. Behavior is normal. Judgment and thought content normal.     BMET    Component Value Date/Time   NA 138 07/31/2021 1340   NA 137 11/14/2020 1409   K 4.6 07/31/2021 1340   CL 101 07/31/2021 1340   CO2 29 07/31/2021 1340   GLUCOSE 92 07/31/2021 1340   BUN 26 (H) 07/31/2021 1340   BUN 15 11/14/2020 1409   CREATININE 0.66 (L) 07/31/2021 1340   CALCIUM 9.4 07/31/2021 1340   GFRNONAA 94 12/18/2017 1137   GFRAA 109 12/18/2017 1137    Lipid Panel      Component Value Date/Time   CHOL 147 07/31/2021 1340   CHOL 217 (H) 12/23/2015 1606   TRIG 62 07/31/2021 1340   HDL 42 07/31/2021 1340   HDL 29 (L) 12/23/2015 1606   CHOLHDL 3.5 07/31/2021 1340   LDLCALC 91 07/31/2021 1340    CBC    Component Value Date/Time   WBC 9.3 07/31/2021 1340   RBC 4.72 07/31/2021 1340   HGB 15.2 07/31/2021 1340   HGB 12.9 (L) 11/14/2020 1409   HCT 45.9 07/31/2021 1340   HCT 40.0 11/14/2020 1409   PLT 220 07/31/2021 1340   PLT 287 11/14/2020 1409   MCV 97.2 07/31/2021 1340   MCV 95 11/14/2020 1409   MCH 32.2 07/31/2021 1340   MCHC 33.1 07/31/2021 1340   RDW 13.6 07/31/2021 1340   RDW 14.4 11/14/2020 1409   LYMPHSABS 0.9 11/14/2020 1409   MONOABS 0.4 06/13/2017 1502   EOSABS 0.2 11/14/2020 1409   BASOSABS 0.0 11/14/2020 1409    Hgb A1C Lab Results  Component Value Date   HGBA1C 5.8 (H) 12/14/2021           Assessment & Plan:   Preventative Health Maintenance:  He declines flu shot today He declines tetanus for financial reasons, advised him if he gets bit or cut to go get this done He denies Pneumovax  Discussed Shingrix vaccine, he will check coverage with his insurance company and schedule a nurse visit if he would like to have this done He declines colon screening He declines lung cancer screening Encouraged him to consume a balanced diet and exercise regimen Advised him see an eye doctor and dentist annually We will check CBC, c-Met, lipid, A1c today  RTC in 6 months, follow-up chronic conditions   Webb Silversmith, NP

## 2022-06-01 NOTE — Assessment & Plan Note (Signed)
Compensated 

## 2022-06-01 NOTE — Assessment & Plan Note (Signed)
Advised him to follow-up with vascular

## 2022-06-02 LAB — CBC
HCT: 51.2 % — ABNORMAL HIGH (ref 38.5–50.0)
Hemoglobin: 17.6 g/dL — ABNORMAL HIGH (ref 13.2–17.1)
MCH: 32.6 pg (ref 27.0–33.0)
MCHC: 34.4 g/dL (ref 32.0–36.0)
MCV: 94.8 fL (ref 80.0–100.0)
MPV: 11.3 fL (ref 7.5–12.5)
Platelets: 220 10*3/uL (ref 140–400)
RBC: 5.4 10*6/uL (ref 4.20–5.80)
RDW: 13.2 % (ref 11.0–15.0)
WBC: 7.6 10*3/uL (ref 3.8–10.8)

## 2022-06-02 LAB — COMPLETE METABOLIC PANEL WITH GFR
AG Ratio: 1.6 (calc) (ref 1.0–2.5)
ALT: 21 U/L (ref 9–46)
AST: 19 U/L (ref 10–35)
Albumin: 4.7 g/dL (ref 3.6–5.1)
Alkaline phosphatase (APISO): 118 U/L (ref 35–144)
BUN: 20 mg/dL (ref 7–25)
CO2: 28 mmol/L (ref 20–32)
Calcium: 9.8 mg/dL (ref 8.6–10.3)
Chloride: 102 mmol/L (ref 98–110)
Creat: 0.72 mg/dL (ref 0.70–1.35)
Globulin: 2.9 g/dL (calc) (ref 1.9–3.7)
Glucose, Bld: 81 mg/dL (ref 65–139)
Potassium: 4.3 mmol/L (ref 3.5–5.3)
Sodium: 139 mmol/L (ref 135–146)
Total Bilirubin: 1 mg/dL (ref 0.2–1.2)
Total Protein: 7.6 g/dL (ref 6.1–8.1)
eGFR: 102 mL/min/{1.73_m2} (ref >=60)

## 2022-06-02 LAB — LIPID PANEL
Cholesterol: 177 mg/dL (ref <200)
HDL: 32 mg/dL — ABNORMAL LOW (ref >=40)
LDL Cholesterol (Calc): 116 mg/dL (calc) — ABNORMAL HIGH
Non-HDL Cholesterol (Calc): 145 mg/dL (calc) — ABNORMAL HIGH (ref <130)
Total CHOL/HDL Ratio: 5.5 (calc) — ABNORMAL HIGH (ref <5.0)
Triglycerides: 173 mg/dL — ABNORMAL HIGH (ref <150)

## 2022-06-02 LAB — HEMOGLOBIN A1C
Hgb A1c MFr Bld: 6 % of total Hgb — ABNORMAL HIGH (ref <5.7)
Mean Plasma Glucose: 126 mg/dL
eAG (mmol/L): 7 mmol/L

## 2022-06-05 ENCOUNTER — Other Ambulatory Visit: Payer: Self-pay

## 2022-06-05 NOTE — Telephone Encounter (Signed)
Copied from [place] [id number]. Topic: General - Inquiry >> Jun 05, 2022  8:53 AM Penni Bombard wrote: Reason for CRM: Pt called saying Webb Silversmith seen him last week and they dicussed her prescribing him Chantex but it has not t sent to [facility] .  CB@  [phone]

## 2022-06-05 NOTE — Telephone Encounter (Signed)
This was sent in 4 days ago

## 2022-06-06 ENCOUNTER — Other Ambulatory Visit: Payer: Self-pay | Admitting: Internal Medicine

## 2022-06-06 MED ORDER — VARENICLINE TARTRATE 0.5 MG PO TABS: 0.5000 mg | ORAL_TABLET | Freq: Two times a day (BID) | ORAL | 0 refills | Status: DC

## 2022-06-06 NOTE — Telephone Encounter (Signed)
Resending to mail order pharmacy per pt request.   Requested Prescriptions  Pending Prescriptions Disp Refills  . varenicline (CHANTIX) 0.5 MG tablet 60 tablet 0    Sig: Take 1 tablet (0.5 mg total) by mouth 2 (two) times daily.     Psychiatry:  Drug Dependence Therapy - varenicline Failed - 06/06/2022  1:53 PM      Failed - Manual Review: Do not refill starter pack. 1 mg tabs may be extended up to one year if the patient has quit smoking but still feels at risk for relapse.      Passed - Cr in normal range and within 180 days    Creat  Date Value Ref Range Status  06/01/2022 0.72 0.70 - 1.35 mg/dL Final         Passed - Completed PHQ-2 or PHQ-9 in the last 360 days      Passed - Valid encounter within last 6 months    Recent Outpatient Visits          5 days ago Prediabetes   [facility] [place], Coralie Keens, NP   5 months ago Peripheral polyneuropathy   [facility] [place], Coralie Keens, NP   10 months ago Mixed hyperlipidemia   [place], [place] W, NP   1 year ago HFrEF (heart failure with reduced ejection fraction) [facility])   [facility], Coralie Keens, NP   1 year ago Bilateral lower extremity edema   [place], DO

## 2022-06-06 NOTE — Telephone Encounter (Signed)
Pt states his varenicline (CHANTIX) 0.5 MG tablet Was sent to the wrong pharmacy.  He states he told someone this yesterday.  Please resend this medication to   [place], [place] is upset and hopes this is done today.

## 2022-06-06 NOTE — Telephone Encounter (Signed)
Pt advised.   Thanks,   -Caasi Giglia  

## 2022-07-02 ENCOUNTER — Other Ambulatory Visit: Payer: Self-pay | Admitting: Internal Medicine

## 2022-07-03 NOTE — Telephone Encounter (Signed)
Requested Prescriptions  Pending Prescriptions Disp Refills  . varenicline (CHANTIX) 0.5 MG tablet [Pharmacy Med Name: VARENICLINE TARTRATE 0.5 MG Tablet] 60 tablet 5    Sig: TAKE 1 TABLET TWICE DAILY     Psychiatry:  Drug Dependence Therapy - varenicline Failed - 07/02/2022  4:22 PM      Failed - Manual Review: Do not refill starter pack. 1 mg tabs may be extended up to one year if the patient has quit smoking but still feels at risk for relapse.      Passed - Cr in normal range and within 180 days    Creat  Date Value Ref Range Status  06/01/2022 0.72 0.70 - 1.35 mg/dL Final         Passed - Completed PHQ-2 or PHQ-9 in the last 360 days      Passed - Valid encounter within last 6 months    Recent Outpatient Visits          1 month ago Prediabetes   [facility] [place], Coralie Keens, NP   6 months ago Peripheral polyneuropathy   [facility] [place], Coralie Keens, NP   11 months ago Mixed hyperlipidemia   [place], [place] W, NP   1 year ago HFrEF (heart failure with reduced ejection fraction) [facility])   [facility], Coralie Keens, NP   1 year ago Bilateral lower extremity edema   [place], DO

## 2022-08-02 ENCOUNTER — Other Ambulatory Visit: Payer: Self-pay | Admitting: Internal Medicine

## 2022-08-02 NOTE — Telephone Encounter (Signed)
Requested medication (s) are due for refill today - provider review   Requested medication (s) are on the active medication list -yes  Future visit scheduled -no  Last refill: 05/23/22 3 each  Notes to clinic: medication not assigned protocol- provider review   Requested Prescriptions  Pending Prescriptions Disp Datil 100-62.5-25 MCG/ACT AEPB [Pharmacy Med Name: TRELEGY ELLIPTA 100-62.5-25 MCG/ACT Aerosol Powder Breath Activated] 180 each 3    Sig: Inhale 1 puff into the lungs daily.     Off-Protocol Failed - 08/02/2022  3:01 AM      Failed - Medication not assigned to a protocol, review manually.      Passed - Valid encounter within last 12 months    Recent Outpatient Visits           2 months ago Prediabetes   [facility] [place], Coralie Keens, NP   7 months ago Peripheral polyneuropathy   [facility], Coralie Keens, NP   1 year ago Mixed hyperlipidemia   [facility] [place], [place] W, NP   1 year ago HFrEF (heart failure with reduced ejection fraction) [facility])   [facility], Coralie Keens, NP   1 year ago Bilateral lower extremity edema   [facility] Olin Hauser, DO                 Requested Prescriptions  Pending Prescriptions Disp Refills   TRELEGY ELLIPTA 100-62.5-25 MCG/ACT AEPB [Pharmacy Med Name: Donnal Debar 100-62.5-25 MCG/ACT Aerosol Powder Breath Activated] 180 each 3    Sig: Inhale 1 puff into the lungs daily.     Off-Protocol Failed - 08/02/2022  3:01 AM      Failed - Medication not assigned to a protocol, review manually.      Passed - Valid encounter within last 12 months    Recent Outpatient Visits           2 months ago Prediabetes   [facility] [place], Coralie Keens, NP   7 months ago Peripheral polyneuropathy   [facility], Coralie Keens, NP   1 year ago Mixed hyperlipidemia   [facility]  [place], [place] W, NP   1 year ago HFrEF (heart failure with reduced ejection fraction) [facility])   [facility], NP   1 year ago Bilateral lower extremity edema   [place], DO

## 2022-08-14 ENCOUNTER — Other Ambulatory Visit: Payer: Self-pay | Admitting: Internal Medicine

## 2022-08-14 NOTE — Telephone Encounter (Signed)
Requested Prescriptions  Pending Prescriptions Disp Refills   gabapentin (NEURONTIN) 600 MG tablet [Pharmacy Med Name: GABAPENTIN 600 MG Tablet] 270 tablet 1    Sig: TAKE 1 TABLET THREE TIMES DAILY     Neurology: Anticonvulsants - gabapentin Passed - 08/14/2022 10:55 AM      Passed - Cr in normal range and within 360 days    Creat  Date Value Ref Range Status  06/01/2022 0.72 0.70 - 1.35 mg/dL Final         Passed - Completed PHQ-2 or PHQ-9 in the last 360 days      Passed - Valid encounter within last 12 months    Recent Outpatient Visits           2 months ago Prediabetes   [facility] [place], Coralie Keens, NP   8 months ago Peripheral polyneuropathy   [facility], Coralie Keens, NP   1 year ago Mixed hyperlipidemia   [place], [place] W, NP   1 year ago HFrEF (heart failure with reduced ejection fraction) [facility])   [facility], Coralie Keens, NP   1 year ago Bilateral lower extremity edema   [place], DO

## 2022-09-04 ENCOUNTER — Other Ambulatory Visit: Payer: Self-pay | Admitting: Internal Medicine

## 2022-09-04 NOTE — Telephone Encounter (Signed)
Medication Refill - Medication: DULoxetine (CYMBALTA) 60 MG capsule /omeprazole (PRILOSEC) 20 MG capsule   Has the patient contacted their pharmacy? yes (Agent: If no, request that the patient contact the pharmacy for the refill. If patient does not wish to contact the pharmacy document the reason why and proceed with request.) (Agent: If yes, when and what did the pharmacy advise?)contact pcp  Preferred Pharmacy (with phone number or street name):  [place], [place] Phone: [phone]  Fax: [phone]     Has the patient been seen for an appointment in the last year OR does the patient have an upcoming appointment? yes  Agent: Please be advised that RX refills may take up to 3 business days. We ask that you follow-up with your pharmacy.

## 2022-09-05 MED ORDER — DULOXETINE HCL 60 MG PO CPEP: 60.0000 mg | ORAL_CAPSULE | Freq: Every day | ORAL | 0 refills | Status: DC

## 2022-09-05 MED ORDER — OMEPRAZOLE 20 MG PO CPDR: 20.0000 mg | DELAYED_RELEASE_CAPSULE | Freq: Every day | ORAL | 0 refills | Status: DC

## 2022-09-05 NOTE — Telephone Encounter (Signed)
Requested Prescriptions  Pending Prescriptions Disp Refills   DULoxetine (CYMBALTA) 60 MG capsule 90 capsule 0    Sig: Take 1 capsule (60 mg total) by mouth daily.     Psychiatry: Antidepressants - SNRI - duloxetine Passed - 09/04/2022 11:25 AM      Passed - Cr in normal range and within 360 days    Creat  Date Value Ref Range Status  06/01/2022 0.72 0.70 - 1.35 mg/dL Final         Passed - eGFR is 30 or above and within 360 days    GFR, Est African American  Date Value Ref Range Status  12/18/2017 109 > OR = 60 mL/min/1.20m Final   GFR, Est Non African American  Date Value Ref Range Status  12/18/2017 94 > OR = 60 mL/min/1.778mFinal   eGFR  Date Value Ref Range Status  06/01/2022 102 > OR = 60 mL/min/1.735minal  11/14/2020 105 >59 mL/min/1.73 Final         Passed - Completed PHQ-2 or PHQ-9 in the last 360 days      Passed - Last BP in normal range    BP Readings from Last 1 Encounters:  06/01/22 112/80         Passed - Valid encounter within last 6 months    Recent Outpatient Visits           3 months ago Prediabetes   [facility]   8 months ago Peripheral polyneuropathy   [facility]   1 year ago Mixed hyperlipidemia   [facility]   1 year ago [facility] failure with reduced ejection fraction) [facility]   1 year ago Bilateral lower extremity edema   [facility]               omeprazole (PRILOSEC) 20 MG capsule 90 capsule 0    Sig: Take 1 capsule (20 mg total) by mouth daily.     Gastroenterology: Proton Pump Inhibitors Passed - 09/04/2022 11:25 AM      Passed - Valid encounter within last 12 months    Recent Outpatient Visits           3 months ago Prediabetes   [facility]   8 months ago Peripheral polyneuropathy    [facility]   1 year ago Mixed hyperlipidemia   [facility] [place] [facility]   1 year ago HFrEF (heart failure with reduced ejection fraction) [facility]   1 year ago Bilateral lower extremity edema   [facility]ou[facility]

## 2022-10-14 ENCOUNTER — Other Ambulatory Visit: Payer: Self-pay | Admitting: Internal Medicine

## 2022-10-14 DIAGNOSIS — G629 Polyneuropathy, unspecified: Secondary | ICD-10-CM

## 2022-11-12 ENCOUNTER — Telehealth: Payer: Self-pay | Admitting: Internal Medicine

## 2022-11-12 NOTE — Telephone Encounter (Signed)
Called patient to schedule Medicare Annual Wellness Visit (AWV). Left message for patient to call back and schedule Medicare Annual Wellness Visit (AWV).  Last date of AWV: 12/04/21  Please schedule an appointment at any time with Kirke Shaggy, NHA  .  If any questions, please contact me.  Thank you ,  Sherol Dade; [place] Direct Dial: [phone]

## 2022-11-18 ENCOUNTER — Other Ambulatory Visit: Payer: Self-pay | Admitting: Internal Medicine

## 2022-11-20 NOTE — Telephone Encounter (Signed)
Requested Prescriptions  Pending Prescriptions Disp Refills   DULoxetine (CYMBALTA) 60 MG capsule [Pharmacy Med Name: DULOXETINE HYDROCHLORIDE 60 MG Capsule Delayed Release Particles] 90 capsule 0    Sig: TAKE 1 CAPSULE EVERY DAY     Psychiatry: Antidepressants - SNRI - duloxetine Passed - 11/18/2022  9:14 PM      Passed - Cr in normal range and within 360 days    Creat  Date Value Ref Range Status  06/01/2022 0.72 0.70 - 1.35 mg/dL Final         Passed - eGFR is 30 or above and within 360 days    GFR, Est African American  Date Value Ref Range Status  12/18/2017 109 > OR = 60 mL/min/1.16m2 Final   GFR, Est Non African American  Date Value Ref Range Status  12/18/2017 94 > OR = 60 mL/min/1.9m2 Final   eGFR  Date Value Ref Range Status  06/01/2022 102 > OR = 60 mL/min/1.51m2 Final  11/14/2020 105 >59 mL/min/1.73 Final         Passed - Completed PHQ-2 or PHQ-9 in the last 360 days      Passed - Last BP in normal range    BP Readings from Last 1 Encounters:  06/01/22 112/80         Passed - Valid encounter within last 6 months    Recent Outpatient Visits           5 months ago Prediabetes   [facility] [place], Coralie Keens, NP   11 months ago Peripheral polyneuropathy   [facility], [facility]ie Keens, NP   1 year ago Mixed hyperlipidemia   [facility] [place], [place] W, NP   1 year ago HFrEF (heart failure with reduced ejection fraction) [facility])   [facility] [place], Coralie Keens, NP   1 year ago Bilateral lower extremity edema   [place], Devonne Doughty, DO               omeprazole (PRILOSEC) 20 MG capsule [Pharmacy Med Name: OMEPRAZOLE 20 MG Capsule Delayed Release] 90 capsule 0    Sig: TAKE 1 CAPSULE EVERY DAY     Gastroenterology: Proton Pump Inhibitors Passed - 11/18/2022  9:14 PM      Passed - Valid encounter within last  12 months    Recent Outpatient Visits           5 months ago Prediabetes   [facility] [place], Coralie Keens, NP   11 months ago Peripheral polyneuropathy   [facility] [place], Coralie Keens, NP   1 year ago Mixed hyperlipidemia   [place], [place] W, NP   1 year ago HFrEF (heart failure with reduced ejection fraction) [facility])   [facility] [place], Coralie Keens, NP   1 year ago Bilateral lower extremity edema   [place], Devonne Doughty, [place]

## 2022-11-26 ENCOUNTER — Other Ambulatory Visit: Payer: Self-pay | Admitting: Internal Medicine

## 2022-11-27 NOTE — Telephone Encounter (Signed)
Requested medication (s) are due for refill today: yes  Requested medication (s) are on the active medication list: yes  Last refill:  07/03/22  Future visit scheduled: no  Notes to clinic:  Manual Review: Do not refill starter pack. 1 mg tabs may be extended up to one year if the patient has quit smoking but still feels at risk for relapse.      Requested Prescriptions  Pending Prescriptions Disp Refills   varenicline (CHANTIX) 0.5 MG tablet [Pharmacy Med Name: VARENICLINE TARTRATE 0.5 MG Tablet] 180 tablet 3    Sig: TAKE 1 TABLET TWICE DAILY     Psychiatry:  Drug Dependence Therapy - varenicline Failed - 11/26/2022  2:31 AM      Failed - Manual Review: Do not refill starter pack. 1 mg tabs may be extended up to one year if the patient has quit smoking but still feels at risk for relapse.      Passed - Cr in normal range and within 180 days    Creat  Date Value Ref Range Status  06/01/2022 0.72 0.70 - 1.35 mg/dL Final         Passed - Completed PHQ-2 or PHQ-9 in the last 360 days      Passed - Valid encounter within last 6 months    Recent Outpatient Visits           5 months ago Prediabetes   [facility] [place], Coralie Keens, NP   11 months ago Peripheral polyneuropathy   [facility] [place], Coralie Keens, NP   1 year ago Mixed hyperlipidemia   [facility] [place], [place], NP   1 year ago HFrEF (heart failure with reduced ejection fraction) [facility])   [facility] [place], Coralie Keens, NP   1 year ago Bilateral lower extremity edema   [place], [place]

## 2023-01-07 ENCOUNTER — Ambulatory Visit: Payer: Medicare HMO | Admitting: Internal Medicine

## 2023-01-07 ENCOUNTER — Other Ambulatory Visit: Payer: Self-pay | Admitting: Internal Medicine

## 2023-01-07 MED ORDER — FUROSEMIDE 20 MG PO TABS: 20.0000 mg | ORAL_TABLET | Freq: Every day | ORAL | 0 refills | Status: DC

## 2023-01-07 MED ORDER — POTASSIUM CHLORIDE CRYS ER 20 MEQ PO TBCR: 20.0000 meq | EXTENDED_RELEASE_TABLET | Freq: Two times a day (BID) | ORAL | 0 refills | Status: DC

## 2023-01-10 ENCOUNTER — Encounter: Payer: Self-pay | Admitting: Internal Medicine

## 2023-01-10 ENCOUNTER — Ambulatory Visit
Admission: RE | Admit: 2023-01-10 | Discharge: 2023-01-10 | Disposition: A | Discharge status: Home / Self Care | Payer: Medicare HMO | Source: Home / Self Care | Attending: Internal Medicine | Admitting: Internal Medicine

## 2023-01-10 ENCOUNTER — Ambulatory Visit
Admission: RE | Admit: 2023-01-10 | Discharge: 2023-01-10 | Disposition: A | Discharge status: Home / Self Care | Payer: Medicare HMO | Source: Ambulatory Visit | Attending: Internal Medicine | Admitting: Internal Medicine

## 2023-01-10 ENCOUNTER — Ambulatory Visit (INDEPENDENT_AMBULATORY_CARE_PROVIDER_SITE_OTHER): Payer: Medicare HMO | Admitting: Internal Medicine

## 2023-01-10 ENCOUNTER — Ambulatory Visit: Payer: Medicare HMO | Admitting: Internal Medicine

## 2023-01-10 VITALS — BP 100/57 | HR 86 | Ht 74.0 in | Wt 160.0 lb

## 2023-01-10 DIAGNOSIS — I6930 Unspecified sequelae of cerebral infarction: Secondary | ICD-10-CM

## 2023-01-10 DIAGNOSIS — R739 Hyperglycemia, unspecified: Secondary | ICD-10-CM

## 2023-01-10 DIAGNOSIS — I70213 Atherosclerosis of native arteries of extremities with intermittent claudication, bilateral legs: Secondary | ICD-10-CM | POA: Diagnosis not present

## 2023-01-10 DIAGNOSIS — I482 Chronic atrial fibrillation, unspecified: Secondary | ICD-10-CM

## 2023-01-10 DIAGNOSIS — M79671 Pain in right foot: Secondary | ICD-10-CM | POA: Diagnosis not present

## 2023-01-10 DIAGNOSIS — R2241 Localized swelling, mass and lump, right lower limb: Secondary | ICD-10-CM | POA: Insufficient documentation

## 2023-01-10 DIAGNOSIS — N401 Enlarged prostate with lower urinary tract symptoms: Secondary | ICD-10-CM

## 2023-01-10 DIAGNOSIS — E782 Mixed hyperlipidemia: Secondary | ICD-10-CM | POA: Diagnosis not present

## 2023-01-10 DIAGNOSIS — R7309 Other abnormal glucose: Secondary | ICD-10-CM

## 2023-01-10 DIAGNOSIS — R350 Frequency of micturition: Secondary | ICD-10-CM

## 2023-01-10 DIAGNOSIS — N529 Male erectile dysfunction, unspecified: Secondary | ICD-10-CM

## 2023-01-10 DIAGNOSIS — I5020 Unspecified systolic (congestive) heart failure: Secondary | ICD-10-CM | POA: Diagnosis not present

## 2023-01-10 DIAGNOSIS — G629 Polyneuropathy, unspecified: Secondary | ICD-10-CM

## 2023-01-10 DIAGNOSIS — M7989 Other specified soft tissue disorders: Secondary | ICD-10-CM | POA: Diagnosis not present

## 2023-01-10 DIAGNOSIS — J449 Chronic obstructive pulmonary disease, unspecified: Secondary | ICD-10-CM

## 2023-01-10 DIAGNOSIS — M0550 Rheumatoid polyneuropathy with rheumatoid arthritis of unspecified site: Secondary | ICD-10-CM | POA: Diagnosis not present

## 2023-01-10 DIAGNOSIS — R296 Repeated falls: Secondary | ICD-10-CM | POA: Diagnosis not present

## 2023-01-10 DIAGNOSIS — R531 Weakness: Secondary | ICD-10-CM | POA: Diagnosis not present

## 2023-01-10 DIAGNOSIS — K219 Gastro-esophageal reflux disease without esophagitis: Secondary | ICD-10-CM

## 2023-01-10 DIAGNOSIS — F319 Bipolar disorder, unspecified: Secondary | ICD-10-CM | POA: Diagnosis not present

## 2023-01-10 NOTE — Progress Notes (Signed)
Subjective:    Patient ID: Ricardo Bravo Sr., male    DOB: 02/15/1956, 67 y.o.   MRN: [id number]  HPI  Patient presents to clinic today for follow-up of chronic conditions.  A-fib: Chronic, managed on Digoxin, Metoprolol and Eliquis.  ECG from 06/2017 reviewed.  He follows with cardiology.  CHF: He has had some recent lower extremity edema and shortness of breath.  He was recently in [place] and hospitalized for CHF exacerbation.  He was recently started on Furosemide and Potassium.  He is taking Metoprolol as well.  Echo from 10/2021 reviewed- care everywhere. He follows with cardiology.  COPD: He reports chronic cough and shortness of breath.  He is taking Trelegy and Albuterol as prescribed.  There are no PFTs on file. He follows with pulmonology.  GERD: He is not sure what triggers this.  He denies breakthrough on Omeprazole.  There is no upper GI on file.  Peripheral Neuropathy: Managed with Gabapentin.  He does not follow with neurology.  ED/BPH: He reports dribbling.  He is taking Flomax and Viagra as prescribed.  He does not follow with urology.  HLD status post Stroke with PAD: His last LDL was 116, triglycerides 173, 05/2022.  He denies myalgias on Atorvastatin.  He is taking Eliquis as well.  He follows with vascular.  Prediabetes: His last A1c was 6%, 05/2022.  He is taking Jardiance as prescribed.  He does not check his sugars.  He does not consume low-fat diet.  He also reports pain, swelling and bruising to his right foot.  He reports this occurred after a fall yesterday.  He is having some difficulty with ambulation.  Review of Systems     Past Medical History:  Diagnosis Date   Arthritis    Rheumatoid   Bipolar disorder (HCC)    Complication of anesthesia    difficulty keeping patient asleep. fentanyl makes patient mean, grumpy and difficult   Depression    Fusion of spine    GERD (gastroesophageal reflux disease)    slightly uncomfortable. takes otc antacids  occasionally   Headache    deteriorating discs causing headaches. uses advil   Hepatitis C    HEP "C". treated 2 years ago   History of kidney stones 2016   Hyperlipidemia    Pericarditis    20 years ago   Peripheral vascular disease (HCC)    Smoker     Current Outpatient Medications  Medication Sig Dispense Refill   albuterol (VENTOLIN HFA) 108 (90 Base) MCG/ACT inhaler      apixaban (ELIQUIS) 5 MG TABS tablet TAKE 1 TABLET TWICE DAILY 180 tablet 1   atorvastatin (LIPITOR) 80 MG tablet Take 1 tablet (80 mg total) by mouth daily. 90 tablet 1   digoxin (LANOXIN) 0.25 MG tablet Take 1 tablet (250 mcg total) by mouth daily. 90 tablet 1   DULoxetine (CYMBALTA) 60 MG capsule TAKE 1 CAPSULE EVERY DAY 90 capsule 0   empagliflozin (JARDIANCE) 10 MG TABS tablet TAKE 1 TABLET EVERY DAY BEFORE BREAKFAST 90 tablet 1   Fluticasone-Umeclidin-Vilant (TRELEGY ELLIPTA) 100-62.5-25 MCG/ACT AEPB INHALE 1 PUFF INTO THE LUNGS DAILY 180 each 1   furosemide (LASIX) 20 MG tablet Take 1 tablet (20 mg total) by mouth daily. 30 tablet 0   gabapentin (NEURONTIN) 600 MG tablet TAKE 1 TABLET THREE TIMES DAILY 270 tablet 1   metoprolol succinate (TOPROL-XL) 25 MG 24 hr tablet Take 1 tablet (25 mg total) by mouth daily. 90 tablet 1  omeprazole (PRILOSEC) 20 MG capsule TAKE 1 CAPSULE EVERY DAY 90 capsule 0   potassium chloride SA (KLOR-CON M) 20 MEQ tablet Take 1 tablet (20 mEq total) by mouth 2 (two) times daily. 30 tablet 0   sildenafil (VIAGRA) 50 MG tablet TAKE 1 TABLET EVERY DAY AS NEEDED FOR ERECTILE DYSFUNCTION 30 tablet 0   tamsulosin (FLOMAX) 0.4 MG CAPS capsule Take 1 capsule (0.4 mg total) by mouth daily. (Patient not taking: Reported on 06/01/2022) 90 capsule 1   varenicline (CHANTIX) 0.5 MG tablet TAKE 1 TABLET TWICE DAILY 60 tablet 5   No current facility-administered medications for this visit.    Allergies  Allergen Reactions   Chantix [Varenicline] Other (See Comments)    Depression    Family  History  Problem Relation Age of Onset   Cancer Mother        liver   Heart disease Father    Diabetes Father    Breast cancer Sister    Brain cancer Sister     Social History   Socioeconomic History   Marital status: Single    Spouse name: Not on file   Number of children: Not on file   Years of education: Not on file   Highest education level: Not on file  Occupational History   Not on file  Tobacco Use   Smoking status: Every Day    Packs/day: 0.50    Years: 40.00    Additional pack years: 0.00    Total pack years: 20.00    Types: Cigarettes   Smokeless tobacco: Never  Vaping Use   Vaping Use: Former  Substance and Sexual Activity   Alcohol use: Yes    Alcohol/week: 2.0 standard drinks of alcohol    Types: 2 Shots of liquor per week    Comment: occasional (every 3-4 weeks)    Drug use: No    Comment: polysubstance approx 2 years ago   Sexual activity: Not Currently  Other Topics Concern   Not on file  Social History Narrative   Not on file   Social Determinants of Health   Financial Resource Strain: Low Risk  (12/07/2021)   Overall Financial Resource Strain (CARDIA)    Difficulty of Paying Living Expenses: Not hard at all  Food Insecurity: No Food Insecurity (12/07/2021)   Hunger Vital Sign    Worried About Running Out of Food in the Last Year: Never true    Ran Out of Food in the Last Year: Never true  Transportation Needs: No Transportation Needs (12/07/2021)   PRAPARE - Administrator, Civil Service (Medical): No    Lack of Transportation (Non-Medical): No  Physical Activity: Insufficiently Active (12/07/2021)   Exercise Vital Sign    Days of Exercise per Week: 4 days    Minutes of Exercise per Session: 10 min  Stress: No Stress Concern Present (12/07/2021)   [employer] of Occupational Health - Occupational Stress Questionnaire    Feeling of Stress : Not at all  Social Connections: Moderately Integrated (12/07/2021)   Social Connection and  Isolation Panel [NHANES]    Frequency of Communication with Friends and Family: More than three times a week    Frequency of Social Gatherings with Friends and Family: More than three times a week    Attends Religious Services: 1 to 4 times per year    Active Member of [employer] or Organizations: No    Attends Banker Meetings: 1 to 4 times per year  Marital Status: Never married  Intimate Partner Violence: Not on file     Constitutional: Denies fever, malaise, fatigue, headache or abrupt weight changes.  HEENT: Denies eye pain, eye redness, ear pain, ringing in the ears, wax buildup, runny nose, nasal congestion, bloody nose, or sore throat. Respiratory: Patient reports chronic cough and shortness of breath.  Denies difficulty breathing, or sputum production.   Cardiovascular: Patient reports swelling in legs.  Denies chest pain, chest tightness, palpitations or swelling in the hands.  Gastrointestinal: Denies abdominal pain, bloating, constipation, diarrhea or blood in the stool.  GU: Patient reports urinary dribbling.  Denies urgency, frequency, pain with urination, burning sensation, blood in urine, odor or discharge. Musculoskeletal: Patient reports right foot pain and swelling, difficulty with gait, Pain with ambulation.  Denies decrease in range of motion.  Skin: Denies redness, rashes, lesions or ulcercations.  Neurological: Denies dizziness, difficulty with memory, difficulty with speech or problems with balance and coordination.  Psych: Denies anxiety, depression, SI/HI.  No other specific complaints in a complete review of systems (except as listed in HPI above).  Objective:   Physical Exam  BP (!) 100/57   Pulse 86   Ht 6\' 2"  (1.88 m)   Wt 160 lb (72.6 kg)   SpO2 95%   BMI 20.54 kg/m   Wt Readings from Last 3 Encounters:  06/01/22 164 lb (74.4 kg)  12/14/21 160 lb (72.6 kg)  07/31/21 151 lb (68.5 kg)    General: Appears his stated age, chronically  ill-appearing, in NAD. Skin: Bruising noted that at the proximal tarsals of the right foot.  There is discoloration noted of bilateral toes. HEENT: Head: normal shape and size; Eyes: sclera white, no icterus, conjunctiva pink, PERRLA and EOMs intact; Ears: Tm's gray and intact, normal light reflex;  Cardiovascular: Normal rate and rhythm. S1,S2 noted.  No murmur, rubs or gallops noted. No JVD.  1+ pitting BLE edema. No carotid bruits noted.  Respiratory 4 to 5 seconds bilateral feet. Pulmonary/Chest: Normal effort and diminished breath sounds. No respiratory distress. No wheezes, rales or ronchi noted.  Abdomen: Soft and nontender. Normal bowel sounds.  Musculoskeletal: Pain with palpation over the mid to distal metatarsals 1) right foot.  Gait slow and steady with use of cane. Neurological: Alert and oriented. Coordination normal.  Psychiatric: Mood and affect normal. Behavior is normal. Judgment and thought content normal.    BMET    Component Value Date/Time   NA 139 06/01/2022 1321   NA 137 11/14/2020 1409   K 4.3 06/01/2022 1321   CL 102 06/01/2022 1321   CO2 28 06/01/2022 1321   GLUCOSE 81 06/01/2022 1321   BUN 20 06/01/2022 1321   BUN 15 11/14/2020 1409   CREATININE 0.72 06/01/2022 1321   CALCIUM 9.8 06/01/2022 1321   GFRNONAA 94 12/18/2017 1137   GFRAA 109 12/18/2017 1137    Lipid Panel     Component Value Date/Time   CHOL 177 06/01/2022 1321   CHOL 217 (H) 12/23/2015 1606   TRIG 173 (H) 06/01/2022 1321   HDL 32 (L) 06/01/2022 1321   HDL 29 (L) 12/23/2015 1606   CHOLHDL 5.5 (H) 06/01/2022 1321   LDLCALC 116 (H) 06/01/2022 1321    CBC    Component Value Date/Time   WBC 7.6 06/01/2022 1321   RBC 5.40 06/01/2022 1321   HGB 17.6 (H) 06/01/2022 1321   HGB 12.9 (L) 11/14/2020 1409   HCT 51.2 (H) 06/01/2022 1321   HCT 40.0 11/14/2020 1409  PLT 220 06/01/2022 1321   PLT 287 11/14/2020 1409   MCV 94.8 06/01/2022 1321   MCV 95 11/14/2020 1409   MCH 32.6 06/01/2022  1321   MCHC 34.4 06/01/2022 1321   RDW 13.2 06/01/2022 1321   RDW 14.4 11/14/2020 1409   LYMPHSABS 0.9 11/14/2020 1409   MONOABS 0.4 06/13/2017 1502   EOSABS 0.2 11/14/2020 1409   BASOSABS 0.0 11/14/2020 1409    Hgb A1C Lab Results  Component Value Date   HGBA1C 6.0 (H) 06/01/2022          Assessment & Plan:   Right Foot Pain and Swelling status post Fall,, Generalized Weakness, Frequent Falls:  X-ray right foot today Encourage rest, ice, compression elevation Referral for PT for strengthening and evaluation of gait  RTC in 6 months for your annual exam Nicki Reaper, NP

## 2023-01-10 NOTE — Progress Notes (Deleted)
Subjective:    Patient ID: Ricardo Bravo Sr., male    DOB: [birth year], 67 y.o.   MRN: [id number]  HPI  Patient presents to clinic today for follow-up of chronic conditions.  A-fib: Chronic, managed on digoxin, metoprolol and Eliquis.  ECG from 06/2017 reviewed.  He follows with cardiology.  CHF: He has had some recent lower extremity edema and shortness of breath.  He was recently in [place] and hospitalized for CHF exacerbation.  He was recently started on Furosemide and Potassium.  He is taking metoprolol as well.  Echo from 10/2021 reviewed- care everywhere. He follows with cardiology.  COPD: He reports chronic cough and shortness of breath.  He is taking Trelegy and Albuterol as prescribed.  There are no PFTs on file. He follows with pulmonology.  GERD: Triggered by.  He denies breakthrough on Omeprazole.  There is no upper GI on file.  Peripheral Neuropathy: Managed with Gabapentin.  He does not follow with neurology.  ED/BPH: He reports.  He is taking Flomax and Viagra as prescribed.  He does not follow with urology.  HLD status post Stroke with PAD: His last LDL was 116, triglycerides 173, 05/2022.  He denies myalgias on Atorvastatin.  He is taking Eliquis as well.  He follows with vascular.  Prediabetes: His last A1c was 6%, 05/2022.  He is taking Jardiance as prescribed.  He does not check his sugars.  He does not consume low-fat diet.  Review of Systems     Past Medical History:  Diagnosis Date   Arthritis    Rheumatoid   Bipolar disorder (HCC)    Complication of anesthesia    difficulty keeping patient asleep. fentanyl makes patient mean, grumpy and difficult   Depression    Fusion of spine    GERD (gastroesophageal reflux disease)    slightly uncomfortable. takes otc antacids occasionally   Headache    deteriorating discs causing headaches. uses advil   Hepatitis C    HEP "C". treated 2 years ago   History of kidney stones 2016   Hyperlipidemia    Pericarditis     20 years ago   Peripheral vascular disease (HCC)    Smoker     Current Outpatient Medications  Medication Sig Dispense Refill   albuterol (VENTOLIN HFA) 108 (90 Base) MCG/ACT inhaler      apixaban (ELIQUIS) 5 MG TABS tablet TAKE 1 TABLET TWICE DAILY 180 tablet 1   atorvastatin (LIPITOR) 80 MG tablet Take 1 tablet (80 mg total) by mouth daily. 90 tablet 1   digoxin (LANOXIN) 0.25 MG tablet Take 1 tablet (250 mcg total) by mouth daily. 90 tablet 1   DULoxetine (CYMBALTA) 60 MG capsule TAKE 1 CAPSULE EVERY DAY 90 capsule 0   empagliflozin (JARDIANCE) 10 MG TABS tablet TAKE 1 TABLET EVERY DAY BEFORE BREAKFAST 90 tablet 1   Fluticasone-Umeclidin-Vilant (TRELEGY ELLIPTA) 100-62.5-25 MCG/ACT AEPB INHALE 1 PUFF INTO THE LUNGS DAILY 180 each 1   furosemide (LASIX) 20 MG tablet Take 1 tablet (20 mg total) by mouth daily. 30 tablet 0   gabapentin (NEURONTIN) 600 MG tablet TAKE 1 TABLET THREE TIMES DAILY 270 tablet 1   metoprolol succinate (TOPROL-XL) 25 MG 24 hr tablet Take 1 tablet (25 mg total) by mouth daily. 90 tablet 1   omeprazole (PRILOSEC) 20 MG capsule TAKE 1 CAPSULE EVERY DAY 90 capsule 0   potassium chloride SA (KLOR-CON M) 20 MEQ tablet Take 1 tablet (20 mEq total) by mouth 2 (two) times  daily. 30 tablet 0   sildenafil (VIAGRA) 50 MG tablet TAKE 1 TABLET EVERY DAY AS NEEDED FOR ERECTILE DYSFUNCTION 30 tablet 0   tamsulosin (FLOMAX) 0.4 MG CAPS capsule Take 1 capsule (0.4 mg total) by mouth daily. (Patient not taking: Reported on 06/01/2022) 90 capsule 1   varenicline (CHANTIX) 0.5 MG tablet TAKE 1 TABLET TWICE DAILY 60 tablet 5   No current facility-administered medications for this visit.    Allergies  Allergen Reactions   Chantix [Varenicline] Other (See Comments)    Depression    Family History  Problem Relation Age of Onset   Cancer Mother        liver   Heart disease Father    Diabetes Father    Breast cancer Sister    Brain cancer Sister     Social History    Socioeconomic History   Marital status: Single    Spouse name: Not on file   Number of children: Not on file   Years of education: Not on file   Highest education level: Not on file  Occupational History   Not on file  Tobacco Use   Smoking status: Every Day    Packs/day: 0.50    Years: 40.00    Additional pack years: 0.00    Total pack years: 20.00    Types: Cigarettes   Smokeless tobacco: Never  Vaping Use   Vaping Use: Former  Substance and Sexual Activity   Alcohol use: Yes    Alcohol/week: 2.0 standard drinks of alcohol    Types: 2 Shots of liquor per week    Comment: occasional (every 3-4 weeks)    Drug use: No    Comment: polysubstance approx 2 years ago   Sexual activity: Not Currently  Other Topics Concern   Not on file  Social History Narrative   Not on file   Social Determinants of Health   Financial Resource Strain: Low Risk  (12/07/2021)   Overall Financial Resource Strain (CARDIA)    Difficulty of Paying Living Expenses: Not hard at all  Food Insecurity: No Food Insecurity (12/07/2021)   Hunger Vital Sign    Worried About Running Out of Food in the Last Year: Never true    Ran Out of Food in the Last Year: Never true  Transportation Needs: No Transportation Needs (12/07/2021)   PRAPARE - Administrator, Civil Service (Medical): No    Lack of Transportation (Non-Medical): No  Physical Activity: Insufficiently Active (12/07/2021)   Exercise Vital Sign    Days of Exercise per Week: 4 days    Minutes of Exercise per Session: 10 min  Stress: No Stress Concern Present (12/07/2021)   [employer] of Occupational Health - Occupational Stress Questionnaire    Feeling of Stress : Not at all  Social Connections: Moderately Integrated (12/07/2021)   Social Connection and Isolation Panel [NHANES]    Frequency of Communication with Friends and Family: More than three times a week    Frequency of Social Gatherings with Friends and Family: More than three  times a week    Attends Religious Services: 1 to 4 times per year    Active Member of [employer] or Organizations: No    Attends Engineer, structural: 1 to 4 times per year    Marital Status: Never married  Intimate Partner Violence: Not on file     Constitutional: Denies fever, malaise, fatigue, headache or abrupt weight changes.  HEENT: Denies eye pain, eye redness,  ear pain, ringing in the ears, wax buildup, runny nose, nasal congestion, bloody nose, or sore throat. Respiratory: Patient reports shortness of breath.  Denies difficulty breathing, cough or sputum production.   Cardiovascular: Patient reports swelling in legs.  Denies chest pain, chest tightness, palpitations or swelling in the hands.  Gastrointestinal: Denies abdominal pain, bloating, constipation, diarrhea or blood in the stool.  GU: Patient reports erectile dysfunction.  Denies urgency, frequency, pain with urination, burning sensation, blood in urine, odor or discharge. Musculoskeletal: Denies decrease in range of motion, difficulty with gait, muscle pain or joint pain and swelling.  Skin: Denies redness, rashes, lesions or ulcercations.  Neurological: Patient reports neuropathic pain.  Denies dizziness, difficulty with memory, difficulty with speech or problems with balance and coordination.  Psych: Denies anxiety, depression, SI/HI.  No other specific complaints in a complete review of systems (except as listed in HPI above).  Objective:   Physical Exam   There were no vitals taken for this visit. Wt Readings from Last 3 Encounters:  06/01/22 164 lb (74.4 kg)  12/14/21 160 lb (72.6 kg)  07/31/21 151 lb (68.5 kg)    General: Appears their stated age, well developed, well nourished in NAD. Skin: Warm, dry and intact. No rashes, lesions or ulcerations noted. HEENT: Head: normal shape and size; Eyes: sclera white, no icterus, conjunctiva pink, PERRLA and EOMs intact; Ears: Tm's gray and intact, normal light  reflex; Nose: mucosa pink and moist, septum midline; Throat/Mouth: Teeth present, mucosa pink and moist, no exudate, lesions or ulcerations noted.  Neck:  Neck supple, trachea midline. No masses, lumps or thyromegaly present.  Cardiovascular: Normal rate and rhythm. S1,S2 noted.  No murmur, rubs or gallops noted. No JVD or BLE edema. No carotid bruits noted. Pulmonary/Chest: Normal effort and positive vesicular breath sounds. No respiratory distress. No wheezes, rales or ronchi noted.  Abdomen: Soft and nontender. Normal bowel sounds. No distention or masses noted. Liver, spleen and kidneys non palpable. Musculoskeletal: Normal range of motion. No signs of joint swelling. No difficulty with gait.  Neurological: Alert and oriented. Cranial nerves II-XII grossly intact. Coordination normal.  Psychiatric: Mood and affect normal. Behavior is normal. Judgment and thought content normal.    BMET    Component Value Date/Time   NA 139 06/01/2022 1321   NA 137 11/14/2020 1409   K 4.3 06/01/2022 1321   CL 102 06/01/2022 1321   CO2 28 06/01/2022 1321   GLUCOSE 81 06/01/2022 1321   BUN 20 06/01/2022 1321   BUN 15 11/14/2020 1409   CREATININE 0.72 06/01/2022 1321   CALCIUM 9.8 06/01/2022 1321   GFRNONAA 94 12/18/2017 1137   GFRAA 109 12/18/2017 1137    Lipid Panel     Component Value Date/Time   CHOL 177 06/01/2022 1321   CHOL 217 (H) 12/23/2015 1606   TRIG 173 (H) 06/01/2022 1321   HDL 32 (L) 06/01/2022 1321   HDL 29 (L) 12/23/2015 1606   CHOLHDL 5.5 (H) 06/01/2022 1321   LDLCALC 116 (H) 06/01/2022 1321    CBC    Component Value Date/Time   WBC 7.6 06/01/2022 1321   RBC 5.40 06/01/2022 1321   HGB 17.6 (H) 06/01/2022 1321   HGB 12.9 (L) 11/14/2020 1409   HCT 51.2 (H) 06/01/2022 1321   HCT 40.0 11/14/2020 1409   PLT 220 06/01/2022 1321   PLT 287 11/14/2020 1409   MCV 94.8 06/01/2022 1321   MCV 95 11/14/2020 1409   MCH 32.6 06/01/2022 1321  MCHC 34.4 06/01/2022 1321   RDW 13.2  06/01/2022 1321   RDW 14.4 11/14/2020 1409   LYMPHSABS 0.9 11/14/2020 1409   MONOABS 0.4 06/13/2017 1502   EOSABS 0.2 11/14/2020 1409   BASOSABS 0.0 11/14/2020 1409    Hgb A1C Lab Results  Component Value Date   HGBA1C 6.0 (H) 06/01/2022           Assessment & Plan:     RTC in 6 months for your annual exam Nicki Reaper, NP

## 2023-01-11 ENCOUNTER — Encounter: Payer: Self-pay | Admitting: Internal Medicine

## 2023-01-11 LAB — HEMOGLOBIN A1C
Hgb A1c MFr Bld: 7.9 % of total Hgb — ABNORMAL HIGH (ref <5.7)
Mean Plasma Glucose: 180 mg/dL
eAG (mmol/L): 10 mmol/L

## 2023-01-11 LAB — BRAIN NATRIURETIC PEPTIDE: Brain Natriuretic Peptide: 227 pg/mL — ABNORMAL HIGH (ref <100)

## 2023-01-11 LAB — COMPLETE METABOLIC PANEL WITH GFR
AG Ratio: 1.7 (calc) (ref 1.0–2.5)
ALT: 22 U/L (ref 9–46)
AST: 12 U/L (ref 10–35)
Albumin: 4 g/dL (ref 3.6–5.1)
Alkaline phosphatase (APISO): 70 U/L (ref 35–144)
BUN: 22 mg/dL (ref 7–25)
CO2: 23 mmol/L (ref 20–32)
Calcium: 9.1 mg/dL (ref 8.6–10.3)
Chloride: 103 mmol/L (ref 98–110)
Creat: 0.81 mg/dL (ref 0.70–1.35)
Globulin: 2.3 g/dL (calc) (ref 1.9–3.7)
Glucose, Bld: 139 mg/dL (ref 65–139)
Potassium: 4.8 mmol/L (ref 3.5–5.3)
Sodium: 137 mmol/L (ref 135–146)
Total Bilirubin: 0.9 mg/dL (ref 0.2–1.2)
Total Protein: 6.3 g/dL (ref 6.1–8.1)
eGFR: 98 mL/min/{1.73_m2} (ref >=60)

## 2023-01-11 LAB — CBC
HCT: 42.6 % (ref 38.5–50.0)
Hemoglobin: 14.5 g/dL (ref 13.2–17.1)
MCH: 34.6 pg — ABNORMAL HIGH (ref 27.0–33.0)
MCHC: 34 g/dL (ref 32.0–36.0)
MCV: 101.7 fL — ABNORMAL HIGH (ref 80.0–100.0)
MPV: 10.4 fL (ref 7.5–12.5)
Platelets: 277 10*3/uL (ref 140–400)
RBC: 4.19 10*6/uL — ABNORMAL LOW (ref 4.20–5.80)
RDW: 18.5 % — ABNORMAL HIGH (ref 11.0–15.0)
WBC: 14.7 10*3/uL — ABNORMAL HIGH (ref 3.8–10.8)

## 2023-01-11 LAB — LIPID PANEL
Cholesterol: 295 mg/dL — ABNORMAL HIGH (ref <200)
HDL: 44 mg/dL (ref >=40)
LDL Cholesterol (Calc): 200 mg/dL (calc) — ABNORMAL HIGH
Non-HDL Cholesterol (Calc): 251 mg/dL (calc) — ABNORMAL HIGH (ref <130)
Total CHOL/HDL Ratio: 6.7 (calc) — ABNORMAL HIGH (ref <5.0)
Triglycerides: 284 mg/dL — ABNORMAL HIGH (ref <150)

## 2023-01-11 NOTE — Assessment & Plan Note (Signed)
Continue omeprazole 

## 2023-01-11 NOTE — Assessment & Plan Note (Signed)
Continue the digoxin, metoprolol and Eliquis He will continue to follow cardiology

## 2023-01-11 NOTE — Assessment & Plan Note (Signed)
C-Met and lipid profile today Encouraged him to continue low-fat diet Continue atorvastatin

## 2023-01-11 NOTE — Assessment & Plan Note (Signed)
Continue Flomax and sildenafil

## 2023-01-11 NOTE — Assessment & Plan Note (Signed)
Continue Flomax and sildenafil 

## 2023-01-11 NOTE — Assessment & Plan Note (Signed)
Referral for PT for further evaluation Continue gabapentin

## 2023-01-11 NOTE — Assessment & Plan Note (Signed)
Recent flare C-Met and BMP today Continue furosemide, potassium and metoprolol Monitor daily weights Reinforced DASH diet

## 2023-01-11 NOTE — Assessment & Plan Note (Signed)
Encourage smoking cessation Continue Trelegy and albuterol 

## 2023-01-11 NOTE — Assessment & Plan Note (Signed)
C-Met and lipid profile today Encouraged him to consume low-fat diet Continue atorvastatin, metoprolol and Eliquis

## 2023-01-11 NOTE — Patient Instructions (Signed)
Heart Failure, Diagnosis  Heart failure means that your heart is not able to pump blood in the right way. This makes it hard for your body to work well. Heart failure is usually a long-term (chronic) condition. You must take good care of yourself and follow your treatment plan from your doctor. Different stages of heart failure have different treatment plans. The stages are: Stage A: At risk for heart failure. Stage B: Pre-heart failure. Stage C: Symptomatic heart failure. Stage D: Advanced heart failure. What are the causes? High blood pressure. Buildup of cholesterol and fat in the arteries. Heart attack. This injures the heart muscle. Heart valves that do not open and close properly. Damage of the heart muscle. This is also called cardiomyopathy. Infection of the heart muscle. This is also called myocarditis. Lung disease. What increases the risk? Getting older. The risk of heart failure goes up as a person ages. Being overweight. Using tobacco or nicotine products. Abusing alcohol or drugs. Having taken medicines that can damage the heart. Having any of these conditions: Diabetes. Abnormal heart rhythms. Thyroid problems. Low blood counts (anemia). Having a family history of heart failure. What are the signs or symptoms? Shortness of breath. Coughing. Swelling of the feet, ankles, legs, or belly. Losing or gaining weight for no reason. Trouble breathing. Waking from sleep because of the need to sit up and get more air. Fast heartbeat. Other symptoms may include: Being very tired. Feeling dizzy, or feeling like you may pass out (faint). Having no desire to eat. Feeling like you may vomit (nauseous). Peeing (urinating) more at night. Feeling confused. How is this treated? This condition may be treated with: Medicines. These can be given to treat blood pressure and to make the heart muscles stronger. Changes in your daily life. These may include: Eating a healthy  diet. Staying at a healthy body weight. Quitting tobacco, alcohol, and drug use. Doing exercises. Participating in a cardiac rehabilitation program. This program helps you improve your health through exercise, education, and counseling. Surgery. Surgery can be done to open blocked valves or to put devices in the heart, such as pacemakers. A donor heart (heart transplant). You will receive a healthy heart from a donor. Follow these instructions at home: Treat other conditions as told by your doctor. These may include high blood pressure, diabetes, thyroid disease, or abnormal heart rhythms. Learn as much as you can about heart failure. Get support as you need it. Keep all follow-up visits. Where to find more information American Heart Association: www.heart.org Centers for Disease Control and Prevention: www.cdc.gov National Institute on Aging: www.nia.nih.gov Summary Heart failure means that your heart is not able to pump blood in the right way. This condition is often caused by high blood pressure, heart attack, or damage of the heart muscle. Symptoms of this condition include shortness of breath and swelling of the feet, ankles, legs, or belly. You may also feel very tired or feel like you may vomit. You may be treated with medicines, surgery, or changes in your daily life. Treat other health conditions as told by your doctor. This information is not intended to replace advice given to you by your health care provider. Make sure you discuss any questions you have with your health care provider. Document Revised: 02/16/2021 Document Reviewed: 03/12/2020 Elsevier Patient Education  2023 Elsevier Inc.  

## 2023-01-11 NOTE — Assessment & Plan Note (Signed)
Having claudication Advised to follow-up with vascular Encourage smoking cessation. Continue atorvastatin and Eliquis

## 2023-01-14 ENCOUNTER — Telehealth: Payer: Self-pay

## 2023-01-14 NOTE — Telephone Encounter (Signed)
-----   Message from Lorre Munroe, NP sent at 01/11/2023 11:07 AM EDT ----- Have patient set up an appointment to discuss labs.  He has new onset diabetes and we need to discuss treatment options.

## 2023-01-14 NOTE — Telephone Encounter (Signed)
Left message for patient to call and schedule appointment regarding lab results.

## 2023-01-18 ENCOUNTER — Other Ambulatory Visit: Payer: Self-pay | Admitting: Internal Medicine

## 2023-01-18 NOTE — Telephone Encounter (Signed)
Requested Prescriptions  Pending Prescriptions Disp Refills   KLOR-CON M20 20 MEQ tablet [Pharmacy Med Name: KLOR-CON M20 TABLET] 180 tablet 0    Sig: TAKE 1 TABLET BY MOUTH 2 TIMES A DAY     Endocrinology:  Minerals - Potassium Supplementation Passed - 01/18/2023  6:17 AM      Passed - K in normal range and within 360 days    Potassium  Date Value Ref Range Status  01/10/2023 4.8 3.5 - 5.3 mmol/L Final         Passed - Cr in normal range and within 360 days    Creat  Date Value Ref Range Status  01/10/2023 0.81 0.70 - 1.35 mg/dL Final         Passed - Valid encounter within last 12 months    Recent Outpatient Visits           1 week ago Mixed hyperlipidemia   [place] [facility] [place], Salvadore Oxford, NP   7 months ago Prediabetes   [facility] [place], Salvadore Oxford, NP   1 year ago Peripheral polyneuropathy   [place] [facility] [place], Salvadore Oxford, NP   1 year ago Mixed hyperlipidemia   [place] [facility] [place], [place] W, NP   2 years ago HFrEF (heart failure with reduced ejection fraction) [facility]   [place] [facility] [place], Salvadore Oxford, NP       Future Appointments             In 3 days [place], Salvadore Oxford, NP [place] [facility], [facility]

## 2023-01-21 ENCOUNTER — Ambulatory Visit: Payer: Medicare HMO | Admitting: Internal Medicine

## 2023-01-21 NOTE — Progress Notes (Deleted)
Subjective:    Patient ID: Ricardo Bravo Sr., male    DOB: [birth year], 67 y.o.   MRN: [id number]  HPI  Patient presents to clinic today for follow-up of recent labs.  His most recent A1c was 7.9% which is a new diagnosis of diabetes for him.  He had been prediabetic in the past.  His recent LDL was 200 with triglycerides of 284.  He is prescribed Atorvastatin.  Review of Systems     Past Medical History:  Diagnosis Date   Arthritis    Rheumatoid   Bipolar disorder (HCC)    Complication of anesthesia    difficulty keeping patient asleep. fentanyl makes patient mean, grumpy and difficult   Depression    Fusion of spine    GERD (gastroesophageal reflux disease)    slightly uncomfortable. takes otc antacids occasionally   Headache    deteriorating discs causing headaches. uses advil   Hepatitis C    HEP "C". treated 2 years ago   History of kidney stones 2016   Hyperlipidemia    Pericarditis    20 years ago   Peripheral vascular disease (HCC)    Smoker     Current Outpatient Medications  Medication Sig Dispense Refill   albuterol (VENTOLIN HFA) 108 (90 Base) MCG/ACT inhaler      apixaban (ELIQUIS) 5 MG TABS tablet TAKE 1 TABLET TWICE DAILY 180 tablet 1   atorvastatin (LIPITOR) 80 MG tablet Take 1 tablet (80 mg total) by mouth daily. 90 tablet 1   digoxin (LANOXIN) 0.25 MG tablet Take 1 tablet (250 mcg total) by mouth daily. 90 tablet 1   DULoxetine (CYMBALTA) 60 MG capsule TAKE 1 CAPSULE EVERY DAY 90 capsule 0   empagliflozin (JARDIANCE) 10 MG TABS tablet TAKE 1 TABLET EVERY DAY BEFORE BREAKFAST 90 tablet 1   Fluticasone-Umeclidin-Vilant (TRELEGY ELLIPTA) 100-62.5-25 MCG/ACT AEPB INHALE 1 PUFF INTO THE LUNGS DAILY 180 each 1   furosemide (LASIX) 20 MG tablet Take 1 tablet (20 mg total) by mouth daily. 30 tablet 0   gabapentin (NEURONTIN) 600 MG tablet TAKE 1 TABLET THREE TIMES DAILY 270 tablet 1   ibuprofen (ADVIL) 200 MG tablet Take 200 mg by mouth every 6 (six) hours  as needed.     metoprolol succinate (TOPROL-XL) 25 MG 24 hr tablet Take 1 tablet (25 mg total) by mouth daily. 90 tablet 1   omeprazole (PRILOSEC) 20 MG capsule TAKE 1 CAPSULE EVERY DAY 90 capsule 0   potassium chloride SA (KLOR-CON M20) 20 MEQ tablet TAKE 1 TABLET BY MOUTH 2 TIMES A DAY 180 tablet 0   sildenafil (VIAGRA) 50 MG tablet TAKE 1 TABLET EVERY DAY AS NEEDED FOR ERECTILE DYSFUNCTION 30 tablet 0   No current facility-administered medications for this visit.    Allergies  Allergen Reactions   Chantix [Varenicline] Other (See Comments)    Depression    Family History  Problem Relation Age of Onset   Cancer Mother        liver   Heart disease Father    Diabetes Father    Breast cancer Sister    Brain cancer Sister     Social History   Socioeconomic History   Marital status: Single    Spouse name: Not on file   Number of children: Not on file   Years of education: Not on file   Highest education level: Not on file  Occupational History   Not on file  Tobacco Use   Smoking status:  Every Day    Packs/day: 0.50    Years: 40.00    Additional pack years: 0.00    Total pack years: 20.00    Types: Cigarettes   Smokeless tobacco: Never  Vaping Use   Vaping Use: Former  Substance and Sexual Activity   Alcohol use: Yes    Alcohol/week: 2.0 standard drinks of alcohol    Types: 2 Shots of liquor per week    Comment: occasional (every 3-4 weeks)    Drug use: No    Comment: polysubstance approx 2 years ago   Sexual activity: Not Currently  Other Topics Concern   Not on file  Social History Narrative   Not on file   Social Determinants of Health   Financial Resource Strain: Low Risk  (12/07/2021)   Overall Financial Resource Strain (CARDIA)    Difficulty of Paying Living Expenses: Not hard at all  Food Insecurity: No Food Insecurity (12/07/2021)   Hunger Vital Sign    Worried About Running Out of Food in the Last Year: Never true    Ran Out of Food in the Last  Year: Never true  Transportation Needs: No Transportation Needs (12/07/2021)   PRAPARE - Administrator, Civil Service (Medical): No    Lack of Transportation (Non-Medical): No  Physical Activity: Insufficiently Active (12/07/2021)   Exercise Vital Sign    Days of Exercise per Week: 4 days    Minutes of Exercise per Session: 10 min  Stress: No Stress Concern Present (12/07/2021)   [employer] of Occupational Health - Occupational Stress Questionnaire    Feeling of Stress : Not at all  Social Connections: Moderately Integrated (12/07/2021)   Social Connection and Isolation Panel [NHANES]    Frequency of Communication with Friends and Family: More than three times a week    Frequency of Social Gatherings with Friends and Family: More than three times a week    Attends Religious Services: 1 to 4 times per year    Active Member of [employer] or Organizations: No    Attends Engineer, structural: 1 to 4 times per year    Marital Status: Never married  Intimate Partner Violence: Not on file     Constitutional: Denies fever, malaise, fatigue, headache or abrupt weight changes.  HEENT: Denies eye pain, eye redness, ear pain, ringing in the ears, wax buildup, runny nose, nasal congestion, bloody nose, or sore throat. Respiratory: Patient reports shortness of breath.  Denies difficulty breathing, cough or sputum production.   Cardiovascular: Patient reports swelling in legs.  Denies chest pain, chest tightness, palpitations or swelling in the hands.  Gastrointestinal: Denies abdominal pain, bloating, constipation, diarrhea or blood in the stool.  GU: Denies urgency, frequency, pain with urination, burning sensation, blood in urine, odor or discharge. Musculoskeletal: Denies decrease in range of motion, difficulty with gait, muscle pain or joint pain and swelling.  Skin: Denies redness, rashes, lesions or ulcercations.  Neurological: Denies dizziness, difficulty with memory,  difficulty with speech or problems with balance and coordination.  Psych: Denies anxiety, depression, SI/HI.  No other specific complaints in a complete review of systems (except as listed in HPI above).  Objective:   Physical Exam   There were no vitals taken for this visit. Wt Readings from Last 3 Encounters:  01/10/23 160 lb (72.6 kg)  06/01/22 164 lb (74.4 kg)  12/14/21 160 lb (72.6 kg)    General: Appears their stated age, well developed, well nourished in NAD. Skin:  Warm, dry and intact. No rashes, lesions or ulcerations noted. HEENT: Head: normal shape and size; Eyes: sclera white, no icterus, conjunctiva pink, PERRLA and EOMs intact; Ears: Tm's gray and intact, normal light reflex; Nose: mucosa pink and moist, septum midline; Throat/Mouth: Teeth present, mucosa pink and moist, no exudate, lesions or ulcerations noted.  Neck:  Neck supple, trachea midline. No masses, lumps or thyromegaly present.  Cardiovascular: Normal rate and rhythm. S1,S2 noted.  No murmur, rubs or gallops noted. No JVD or BLE edema. No carotid bruits noted. Pulmonary/Chest: Normal effort and positive vesicular breath sounds. No respiratory distress. No wheezes, rales or ronchi noted.  Abdomen: Soft and nontender. Normal bowel sounds. No distention or masses noted. Liver, spleen and kidneys non palpable. Musculoskeletal: Normal range of motion. No signs of joint swelling. No difficulty with gait.  Neurological: Alert and oriented. Cranial nerves II-XII grossly intact. Coordination normal.  Psychiatric: Mood and affect normal. Behavior is normal. Judgment and thought content normal.    BMET    Component Value Date/Time   NA 137 01/10/2023 1601   NA 137 11/14/2020 1409   K 4.8 01/10/2023 1601   CL 103 01/10/2023 1601   CO2 23 01/10/2023 1601   GLUCOSE 139 01/10/2023 1601   BUN 22 01/10/2023 1601   BUN 15 11/14/2020 1409   CREATININE 0.81 01/10/2023 1601   CALCIUM 9.1 01/10/2023 1601   GFRNONAA 94  12/18/2017 1137   GFRAA 109 12/18/2017 1137    Lipid Panel     Component Value Date/Time   CHOL 295 (H) 01/10/2023 1601   CHOL 217 (H) 12/23/2015 1606   TRIG 284 (H) 01/10/2023 1601   HDL 44 01/10/2023 1601   HDL 29 (L) 12/23/2015 1606   CHOLHDL 6.7 (H) 01/10/2023 1601   LDLCALC 200 (H) 01/10/2023 1601    CBC    Component Value Date/Time   WBC 14.7 (H) 01/10/2023 1601   RBC 4.19 (L) 01/10/2023 1601   HGB 14.5 01/10/2023 1601   HGB 12.9 (L) 11/14/2020 1409   HCT 42.6 01/10/2023 1601   HCT 40.0 11/14/2020 1409   PLT 277 01/10/2023 1601   PLT 287 11/14/2020 1409   MCV 101.7 (H) 01/10/2023 1601   MCV 95 11/14/2020 1409   MCH 34.6 (H) 01/10/2023 1601   MCHC 34.0 01/10/2023 1601   RDW 18.5 (H) 01/10/2023 1601   RDW 14.4 11/14/2020 1409   LYMPHSABS 0.9 11/14/2020 1409   MONOABS 0.4 06/13/2017 1502   EOSABS 0.2 11/14/2020 1409   BASOSABS 0.0 11/14/2020 1409    Hgb A1C Lab Results  Component Value Date   HGBA1C 7.9 (H) 01/10/2023           Assessment & Plan:   RTC in 3 months for follow-up of chronic conditions.

## 2023-01-24 ENCOUNTER — Ambulatory Visit (INDEPENDENT_AMBULATORY_CARE_PROVIDER_SITE_OTHER): Payer: Medicare HMO | Admitting: Internal Medicine

## 2023-01-24 ENCOUNTER — Encounter: Payer: Self-pay | Admitting: Internal Medicine

## 2023-01-24 VITALS — BP 93/67 | HR 104 | Temp 96.6°F | Wt 151.0 lb

## 2023-01-24 DIAGNOSIS — E1151 Type 2 diabetes mellitus with diabetic peripheral angiopathy without gangrene: Secondary | ICD-10-CM | POA: Diagnosis not present

## 2023-01-24 DIAGNOSIS — E1169 Type 2 diabetes mellitus with other specified complication: Secondary | ICD-10-CM | POA: Diagnosis not present

## 2023-01-24 DIAGNOSIS — E785 Hyperlipidemia, unspecified: Secondary | ICD-10-CM

## 2023-01-24 MED ORDER — ONDANSETRON 4 MG PO TBDP: 4.0000 mg | ORAL_TABLET | Freq: Three times a day (TID) | ORAL | 0 refills | Status: DC | PRN

## 2023-01-24 MED ORDER — ATORVASTATIN CALCIUM 80 MG PO TABS: 80.0000 mg | ORAL_TABLET | Freq: Every day | ORAL | 1 refills | Status: DC

## 2023-01-24 NOTE — Assessment & Plan Note (Signed)
He is on Jardiance but does not want to start additional medications at this time Discussed decreasing steroid use Encourage low-carb diet Encouraged routine eye exam Encouraged routine foot exam

## 2023-01-24 NOTE — Patient Instructions (Signed)

## 2023-01-24 NOTE — Assessment & Plan Note (Signed)
Atorvastatin refilled today, advised him to take this daily Encouraged low-fat diet

## 2023-01-24 NOTE — Progress Notes (Signed)
Subjective:    Patient ID: Ricardo Bravo Sr., male    DOB: Dec 10, 1955, 67 y.o.   MRN: [id number]  HPI  Patient presents to clinic today for follow-up of recent labs.  He had a recent A1c of 7.9% which indicates new onset diabetes.  He has been prediabetic.  His last LDL was 200, triglycerides 284.  He is prescribed Atorvastatin but his son does not think he has been taking this.  He has also been taking steroids and using his Trelegy inhaler more often.  He admits to having a very poor diet and lack of exercise.  Review of Systems     Past Medical History:  Diagnosis Date   Arthritis    Rheumatoid   Bipolar disorder (HCC)    Complication of anesthesia    difficulty keeping patient asleep. fentanyl makes patient mean, grumpy and difficult   Depression    Fusion of spine    GERD (gastroesophageal reflux disease)    slightly uncomfortable. takes otc antacids occasionally   Headache    deteriorating discs causing headaches. uses advil   Hepatitis C    HEP "C". treated 2 years ago   History of kidney stones 2016   Hyperlipidemia    Pericarditis    20 years ago   Peripheral vascular disease (HCC)    Smoker     Current Outpatient Medications  Medication Sig Dispense Refill   albuterol (VENTOLIN HFA) 108 (90 Base) MCG/ACT inhaler      apixaban (ELIQUIS) 5 MG TABS tablet TAKE 1 TABLET TWICE DAILY 180 tablet 1   atorvastatin (LIPITOR) 80 MG tablet Take 1 tablet (80 mg total) by mouth daily. 90 tablet 1   digoxin (LANOXIN) 0.25 MG tablet Take 1 tablet (250 mcg total) by mouth daily. 90 tablet 1   DULoxetine (CYMBALTA) 60 MG capsule TAKE 1 CAPSULE EVERY DAY 90 capsule 0   empagliflozin (JARDIANCE) 10 MG TABS tablet TAKE 1 TABLET EVERY DAY BEFORE BREAKFAST 90 tablet 1   Fluticasone-Umeclidin-Vilant (TRELEGY ELLIPTA) 100-62.5-25 MCG/ACT AEPB INHALE 1 PUFF INTO THE LUNGS DAILY 180 each 1   furosemide (LASIX) 20 MG tablet Take 1 tablet (20 mg total) by mouth daily. 30 tablet 0    gabapentin (NEURONTIN) 600 MG tablet TAKE 1 TABLET THREE TIMES DAILY 270 tablet 1   ibuprofen (ADVIL) 200 MG tablet Take 200 mg by mouth every 6 (six) hours as needed.     metoprolol succinate (TOPROL-XL) 25 MG 24 hr tablet Take 1 tablet (25 mg total) by mouth daily. 90 tablet 1   omeprazole (PRILOSEC) 20 MG capsule TAKE 1 CAPSULE EVERY DAY 90 capsule 0   potassium chloride SA (KLOR-CON M20) 20 MEQ tablet TAKE 1 TABLET BY MOUTH 2 TIMES A DAY 180 tablet 0   sildenafil (VIAGRA) 50 MG tablet TAKE 1 TABLET EVERY DAY AS NEEDED FOR ERECTILE DYSFUNCTION 30 tablet 0   No current facility-administered medications for this visit.    Allergies  Allergen Reactions   Chantix [Varenicline] Other (See Comments)    Depression    Family History  Problem Relation Age of Onset   Cancer Mother        liver   Heart disease Father    Diabetes Father    Breast cancer Sister    Brain cancer Sister     Social History   Socioeconomic History   Marital status: Single    Spouse name: Not on file   Number of children: Not on file  Years of education: Not on file   Highest education level: Not on file  Occupational History   Not on file  Tobacco Use   Smoking status: Every Day    Packs/day: 0.50    Years: 40.00    Additional pack years: 0.00    Total pack years: 20.00    Types: Cigarettes   Smokeless tobacco: Never  Vaping Use   Vaping Use: Former  Substance and Sexual Activity   Alcohol use: Yes    Alcohol/week: 2.0 standard drinks of alcohol    Types: 2 Shots of liquor per week    Comment: occasional (every 3-4 weeks)    Drug use: No    Comment: polysubstance approx 2 years ago   Sexual activity: Not Currently  Other Topics Concern   Not on file  Social History Narrative   Not on file   Social Determinants of Health   Financial Resource Strain: Low Risk  (12/07/2021)   Overall Financial Resource Strain (CARDIA)    Difficulty of Paying Living Expenses: Not hard at all  Food  Insecurity: No Food Insecurity (12/07/2021)   Hunger Vital Sign    Worried About Running Out of Food in the Last Year: Never true    Ran Out of Food in the Last Year: Never true  Transportation Needs: No Transportation Needs (12/07/2021)   PRAPARE - Administrator, Civil Service (Medical): No    Lack of Transportation (Non-Medical): No  Physical Activity: Insufficiently Active (12/07/2021)   Exercise Vital Sign    Days of Exercise per Week: 4 days    Minutes of Exercise per Session: 10 min  Stress: No Stress Concern Present (12/07/2021)   [employer] of Occupational Health - Occupational Stress Questionnaire    Feeling of Stress : Not at all  Social Connections: Moderately Integrated (12/07/2021)   Social Connection and Isolation Panel [NHANES]    Frequency of Communication with Friends and Family: More than three times a week    Frequency of Social Gatherings with Friends and Family: More than three times a week    Attends Religious Services: 1 to 4 times per year    Active Member of [employer] or Organizations: No    Attends Engineer, structural: 1 to 4 times per year    Marital Status: Never married  Intimate Partner Violence: Not on file     Constitutional: Patient reports fatigue.  Denies fever, malaise, headache or abrupt weight changes.  HEENT: Denies eye pain, eye redness, ear pain, ringing in the ears, wax buildup, runny nose, nasal congestion, bloody nose, or sore throat. Respiratory: Patient reports intermittent shortness of breath.  Denies difficulty breathing, cough or sputum production.   Cardiovascular: Patient reports swelling in legs.  Denies chest pain, chest tightness, palpitations or swelling in the hands.  Gastrointestinal: Patient reports nausea and loose stools.  Denies abdominal pain, bloating, constipation, or blood in the stool.  GU: Denies urgency, frequency, pain with urination, burning sensation, blood in urine, odor or  discharge. Musculoskeletal: Patient reports generalized weakness, difficulty with gait.  Denies decrease in range of motion, muscle pain or joint pain and swelling.  Skin: Denies redness, rashes, lesions or ulcercations.  Neurological: Patient reports neuropathic pain.  Denies dizziness, difficulty with memory, difficulty with speech or problems with balance and coordination.  Psych: Denies anxiety, depression, SI/HI.  No other specific complaints in a complete review of systems (except as listed in HPI above).  Objective:   Physical Exam  BP 93/67 (BP Location: Right Arm, Patient Position: Sitting, Cuff Size: Small)   Pulse (!) 104   Temp (!) 96.6 F (35.9 C) (Temporal)   Wt 151 lb (68.5 kg)   SpO2 98%   BMI 19.39 kg/m   Wt Readings from Last 3 Encounters:  01/10/23 160 lb (72.6 kg)  06/01/22 164 lb (74.4 kg)  12/14/21 160 lb (72.6 kg)    General: Appears his stated age, chronically ill hearing, in NAD. Skin: Warm, dry and intact.  Bluish discoloration of lower extremities.  No ulcerations noted. HEENT: Head: normal shape and size; Eyes: sclera white, no icterus, conjunctiva pink, PERRLA and EOMs intact;  Cardiovascular: Tachycardic with normal rhythm. S1,S2 noted.   Pulmonary/Chest: Normal effort and diminished breath sounds. No respiratory distress. No wheezes, rales or ronchi noted.  Musculoskeletal: Gait slow and steady with use of cane. Neurological: Alert and oriented.    BMET    Component Value Date/Time   NA 137 01/10/2023 1601   NA 137 11/14/2020 1409   K 4.8 01/10/2023 1601   CL 103 01/10/2023 1601   CO2 23 01/10/2023 1601   GLUCOSE 139 01/10/2023 1601   BUN 22 01/10/2023 1601   BUN 15 11/14/2020 1409   CREATININE 0.81 01/10/2023 1601   CALCIUM 9.1 01/10/2023 1601   GFRNONAA 94 12/18/2017 1137   GFRAA 109 12/18/2017 1137    Lipid Panel     Component Value Date/Time   CHOL 295 (H) 01/10/2023 1601   CHOL 217 (H) 12/23/2015 1606   TRIG 284 (H)  01/10/2023 1601   HDL 44 01/10/2023 1601   HDL 29 (L) 12/23/2015 1606   CHOLHDL 6.7 (H) 01/10/2023 1601   LDLCALC 200 (H) 01/10/2023 1601    CBC    Component Value Date/Time   WBC 14.7 (H) 01/10/2023 1601   RBC 4.19 (L) 01/10/2023 1601   HGB 14.5 01/10/2023 1601   HGB 12.9 (L) 11/14/2020 1409   HCT 42.6 01/10/2023 1601   HCT 40.0 11/14/2020 1409   PLT 277 01/10/2023 1601   PLT 287 11/14/2020 1409   MCV 101.7 (H) 01/10/2023 1601   MCV 95 11/14/2020 1409   MCH 34.6 (H) 01/10/2023 1601   MCHC 34.0 01/10/2023 1601   RDW 18.5 (H) 01/10/2023 1601   RDW 14.4 11/14/2020 1409   LYMPHSABS 0.9 11/14/2020 1409   MONOABS 0.4 06/13/2017 1502   EOSABS 0.2 11/14/2020 1409   BASOSABS 0.0 11/14/2020 1409    Hgb A1C Lab Results  Component Value Date   HGBA1C 7.9 (H) 01/10/2023           Assessment & Plan:    RTC in 3 months for follow-up chronic conditions Nicki Reaper, NP

## 2023-01-25 DIAGNOSIS — I716 Thoracoabdominal aortic aneurysm, without rupture, unspecified: Secondary | ICD-10-CM | POA: Diagnosis not present

## 2023-01-25 DIAGNOSIS — I7143 Infrarenal abdominal aortic aneurysm, without rupture: Secondary | ICD-10-CM | POA: Diagnosis not present

## 2023-01-25 DIAGNOSIS — I714 Abdominal aortic aneurysm, without rupture, unspecified: Secondary | ICD-10-CM | POA: Diagnosis not present

## 2023-02-01 ENCOUNTER — Other Ambulatory Visit: Payer: Self-pay | Admitting: Internal Medicine

## 2023-02-01 NOTE — Telephone Encounter (Signed)
Requested Prescriptions  Pending Prescriptions Disp Refills   DULoxetine (CYMBALTA) 60 MG capsule [Pharmacy Med Name: DULOXETINE HYDROCHLORIDE 60 MG Capsule Delayed Release Particles] 90 capsule 1    Sig: TAKE 1 CAPSULE EVERY DAY     Psychiatry: Antidepressants - SNRI - duloxetine Passed - 02/01/2023  2:47 AM      Passed - Cr in normal range and within 360 days    Creat  Date Value Ref Range Status  01/10/2023 0.81 0.70 - 1.35 mg/dL Final         Passed - eGFR is 30 or above and within 360 days    GFR, Est African American  Date Value Ref Range Status  12/18/2017 109 > OR = 60 mL/min/1.22m2 Final   GFR, Est Non African American  Date Value Ref Range Status  12/18/2017 94 > OR = 60 mL/min/1.38m2 Final   eGFR  Date Value Ref Range Status  01/10/2023 98 > OR = 60 mL/min/1.12m2 Final  11/14/2020 105 >59 mL/min/1.73 Final         Passed - Completed PHQ-2 or PHQ-9 in the last 360 days      Passed - Last BP in normal range    BP Readings from Last 1 Encounters:  01/24/23 93/67         Passed - Valid encounter within last 6 months    Recent Outpatient Visits           1 week ago Hyperlipidemia associated with type 2 diabetes mellitus [facility])   [facility] [place], Salvadore Oxford, NP   3 weeks ago Mixed hyperlipidemia   [place] [facility] [place], Salvadore Oxford, NP   8 months ago Prediabetes   [place] [facility] [place], Salvadore Oxford, NP   1 year ago Peripheral polyneuropathy   [place] [facility] [place], Salvadore Oxford, NP   1 year ago Mixed hyperlipidemia   [place] [facility] [place], Salvadore Oxford, NP       Future Appointments             In 2 months Baity, Salvadore Oxford, NP [facility] [facility]             omeprazole (PRILOSEC) 20 MG capsule [facility] Name: OMEPRAZOLE 20 MG Capsule Delayed Release] 90 capsule 3    Sig: TAKE 1 CAPSULE EVERY DAY      Gastroenterology: Proton Pump Inhibitors Passed - 02/01/2023  2:47 AM      Passed - Valid encounter within last 12 months    Recent Outpatient Visits           1 week ago Hyperlipidemia associated with type 2 diabetes mellitus [facility])   [place] [facility] [place], Salvadore Oxford, NP   3 weeks ago Mixed hyperlipidemia   [place] [facility] [place], Salvadore Oxford, NP   8 months ago Prediabetes   [place] [facility] [place], Salvadore Oxford, NP   1 year ago Peripheral polyneuropathy   [place] [facility] [address], Salvadore Oxford, NP   1 year ago Mixed hyperlipidemia   [place] [facility] [facility] [place], Salvadore Oxford, NP       Future Appointments             In 2 months Baity, Salvadore Oxford, NP [facility]

## 2023-02-12 DIAGNOSIS — Z9889 Other specified postprocedural states: Secondary | ICD-10-CM | POA: Diagnosis not present

## 2023-02-12 DIAGNOSIS — I739 Peripheral vascular disease, unspecified: Secondary | ICD-10-CM | POA: Diagnosis not present

## 2023-02-21 DIAGNOSIS — I714 Abdominal aortic aneurysm, without rupture, unspecified: Secondary | ICD-10-CM | POA: Diagnosis not present

## 2023-02-21 DIAGNOSIS — I739 Peripheral vascular disease, unspecified: Secondary | ICD-10-CM | POA: Diagnosis not present

## 2023-02-21 DIAGNOSIS — I5020 Unspecified systolic (congestive) heart failure: Secondary | ICD-10-CM | POA: Diagnosis not present

## 2023-02-28 ENCOUNTER — Ambulatory Visit (INDEPENDENT_AMBULATORY_CARE_PROVIDER_SITE_OTHER): Payer: Medicare HMO

## 2023-02-28 VITALS — Ht 74.0 in | Wt 150.0 lb

## 2023-02-28 DIAGNOSIS — Z0000 Encounter for general adult medical examination without abnormal findings: Secondary | ICD-10-CM

## 2023-02-28 NOTE — Patient Instructions (Signed)
Ricardo Cervantes , Thank you for taking time to come for your Medicare Wellness Visit. I appreciate your ongoing commitment to your health goals. Please review the following plan we discussed and let me know if I can assist you in the future.   These are the goals we discussed:  Goals      Patient Stated     02/28/2023, staying alive     Quit smoking / using tobacco     Smoking cessation discussed        This is a list of the screening recommended for you and due dates:  Health Maintenance  Topic Date Due   Eye exam for diabetics  Never done   Yearly kidney health urinalysis for diabetes  Never done   DTaP/Tdap/Td vaccine (1 - Tdap) Never done   Screening for Lung Cancer  Never done   COVID-19 Vaccine (3 - Mixed Product risk series) 11/09/2020   Zoster (Shingles) Vaccine (1 of 2) 04/12/2023*   Pneumonia Vaccine (1 of 2 - PCV) 01/10/2024*   Colon Cancer Screening  01/10/2024*   Flu Shot  04/04/2023   Hemoglobin A1C  07/13/2023   Yearly kidney function blood test for diabetes  01/10/2024   Complete foot exam   01/24/2024   Medicare Annual Wellness Visit  02/28/2024   Hepatitis C Screening  Completed   HIV Screening  Completed   HPV Vaccine  Aged Out  *Topic was postponed. The date shown is not the original due date.    Advanced directives: Advance directive discussed with you today.   Conditions/risks identified: smoking  Next appointment: Follow up in one year for your annual wellness visit.   Preventive Care 41 Years and Older, Male  Preventive care refers to lifestyle choices and visits with your health care provider that can promote health and wellness. What does preventive care include? A yearly physical exam. This is also called an annual well check. Dental exams once or twice a year. Routine eye exams. Ask your health care provider how often you should have your eyes checked. Personal lifestyle choices, including: Daily care of your teeth and gums. Regular physical  activity. Eating a healthy diet. Avoiding tobacco and drug use. Limiting alcohol use. Practicing safe sex. Taking low doses of aspirin every day. Taking vitamin and mineral supplements as recommended by your health care provider. What happens during an annual well check? The services and screenings done by your health care provider during your annual well check will depend on your age, overall health, lifestyle risk factors, and family history of disease. Counseling  Your health care provider may ask you questions about your: Alcohol use. Tobacco use. Drug use. Emotional well-being. Home and relationship well-being. Sexual activity. Eating habits. History of falls. Memory and ability to understand (cognition). Work and work Astronomer. Screening  You may have the following tests or measurements: Height, weight, and BMI. Blood pressure. Lipid and cholesterol levels. These may be checked every 5 years, or more frequently if you are over 45 years old. Skin check. Lung cancer screening. You may have this screening every year starting at age 53 if you have a 30-pack-year history of smoking and currently smoke or have quit within the past 15 years. Fecal occult blood test (FOBT) of the stool. You may have this test every year starting at age 69. Flexible sigmoidoscopy or colonoscopy. You may have a sigmoidoscopy every 5 years or a colonoscopy every 10 years starting at age 78. Prostate cancer screening. Recommendations will  vary depending on your family history and other risks. Hepatitis C blood test. Hepatitis B blood test. Sexually transmitted disease (STD) testing. Diabetes screening. This is done by checking your blood sugar (glucose) after you have not eaten for a while (fasting). You may have this done every 1-3 years. Abdominal aortic aneurysm (AAA) screening. You may need this if you are a current or former smoker. Osteoporosis. You may be screened starting at age 60 if you are  at high risk. Talk with your health care provider about your test results, treatment options, and if necessary, the need for more tests. Vaccines  Your health care provider may recommend certain vaccines, such as: Influenza vaccine. This is recommended every year. Tetanus, diphtheria, and acellular pertussis (Tdap, Td) vaccine. You may need a Td booster every 10 years. Zoster vaccine. You may need this after age 28. Pneumococcal 13-valent conjugate (PCV13) vaccine. One dose is recommended after age 52. Pneumococcal polysaccharide (PPSV23) vaccine. One dose is recommended after age 75. Talk to your health care provider about which screenings and vaccines you need and how often you need them. This information is not intended to replace advice given to you by your health care provider. Make sure you discuss any questions you have with your health care provider. Document Released: 09/16/2015 Document Revised: 05/09/2016 Document Reviewed: 06/21/2015 Elsevier Interactive Patient Education  2017 [employer] Prevention in the Home Falls can cause injuries. They can happen to people of all ages. There are many things you can do to make your home safe and to help prevent falls. What can I do on the outside of my home? Regularly fix the edges of walkways and driveways and fix any cracks. Remove anything that might make you trip as you walk through a door, such as a raised step or threshold. Trim any bushes or trees on the path to your home. Use bright outdoor lighting. Clear any walking paths of anything that might make someone trip, such as rocks or tools. Regularly check to see if handrails are loose or broken. Make sure that both sides of any steps have handrails. Any raised decks and porches should have guardrails on the edges. Have any leaves, snow, or ice cleared regularly. Use sand or salt on walking paths during winter. Clean up any spills in your garage right away. This includes oil  or grease spills. What can I do in the bathroom? Use night lights. Install grab bars by the toilet and in the tub and shower. Do not use towel bars as grab bars. Use non-skid mats or decals in the tub or shower. If you need to sit down in the shower, use a plastic, non-slip stool. Keep the floor dry. Clean up any water that spills on the floor as soon as it happens. Remove soap buildup in the tub or shower regularly. Attach bath mats securely with double-sided non-slip rug tape. Do not have throw rugs and other things on the floor that can make you trip. What can I do in the bedroom? Use night lights. Make sure that you have a light by your bed that is easy to reach. Do not use any sheets or blankets that are too big for your bed. They should not hang down onto the floor. Have a firm chair that has side arms. You can use this for support while you get dressed. Do not have throw rugs and other things on the floor that can make you trip. What can I do in  the kitchen? Clean up any spills right away. Avoid walking on wet floors. Keep items that you use a lot in easy-to-reach places. If you need to reach something above you, use a strong step stool that has a grab bar. Keep electrical cords out of the way. Do not use floor polish or wax that makes floors slippery. If you must use wax, use non-skid floor wax. Do not have throw rugs and other things on the floor that can make you trip. What can I do with my stairs? Do not leave any items on the stairs. Make sure that there are handrails on both sides of the stairs and use them. Fix handrails that are broken or loose. Make sure that handrails are as long as the stairways. Check any carpeting to make sure that it is firmly attached to the stairs. Fix any carpet that is loose or worn. Avoid having throw rugs at the top or bottom of the stairs. If you do have throw rugs, attach them to the floor with carpet tape. Make sure that you have a light  switch at the top of the stairs and the bottom of the stairs. If you do not have them, ask someone to add them for you. What else can I do to help prevent falls? Wear shoes that: Do not have high heels. Have rubber bottoms. Are comfortable and fit you well. Are closed at the toe. Do not wear sandals. If you use a stepladder: Make sure that it is fully opened. Do not climb a closed stepladder. Make sure that both sides of the stepladder are locked into place. Ask someone to hold it for you, if possible. Clearly mark and make sure that you can see: Any grab bars or handrails. First and last steps. Where the edge of each step is. Use tools that help you move around (mobility aids) if they are needed. These include: Canes. Walkers. Scooters. Crutches. Turn on the lights when you go into a dark area. Replace any light bulbs as soon as they burn out. Set up your furniture so you have a clear path. Avoid moving your furniture around. If any of your floors are uneven, fix them. If there are any pets around you, be aware of where they are. Review your medicines with your doctor. Some medicines can make you feel dizzy. This can increase your chance of falling. Ask your doctor what other things that you can do to help prevent falls. This information is not intended to replace advice given to you by your health care provider. Make sure you discuss any questions you have with your health care provider. Document Released: 06/16/2009 Document Revised: 01/26/2016 Document Reviewed: 09/24/2014 Elsevier Interactive Patient Education  2017 [employer].

## 2023-02-28 NOTE — Progress Notes (Signed)
Subjective:   Ricardo Bravo Sr. is a 67 y.o. male who presents for Medicare Annual/Subsequent preventive examination.  Visit Complete: Virtual  I connected with  Ricardo Bravo Sr. on 02/28/23 by a audio enabled telemedicine application and verified that I am speaking with the correct person using two identifiers.  Patient Location: Home  Provider Location: Office/Clinic  I discussed the limitations of evaluation and management by telemedicine. The patient expressed understanding and agreed to proceed.    Review of Systems     Cardiac Risk Factors include: advanced age (>65men, >45 women);diabetes mellitus;dyslipidemia;male gender;smoking/ tobacco exposure     Objective:    Today's Vitals   02/28/23 1124 02/28/23 1126  Weight: 150 lb (68 kg)   Height: 6\' 2"  (1.88 m)   PainSc:  6    Body mass index is 19.26 kg/m.     02/28/2023   11:39 AM 07/04/2017    2:43 PM 06/26/2017    9:37 AM 06/19/2017    6:10 PM 06/19/2017    9:25 AM 06/13/2017    2:47 PM 05/22/2017    3:30 PM  Advanced Directives  Does Patient Have a Medical Advance Directive? No No No No No No No  Would patient like information on creating a medical advance directive?   No - Patient declined No - Patient declined  No - Patient declined     Current Medications (verified) Outpatient Encounter Medications as of 02/28/2023  Medication Sig   apixaban (ELIQUIS) 5 MG TABS tablet TAKE 1 TABLET TWICE DAILY   atorvastatin (LIPITOR) 80 MG tablet Take 1 tablet (80 mg total) by mouth daily.   digoxin (LANOXIN) 0.25 MG tablet Take 1 tablet (250 mcg total) by mouth daily.   DULoxetine (CYMBALTA) 60 MG capsule TAKE 1 CAPSULE EVERY DAY   empagliflozin (JARDIANCE) 10 MG TABS tablet TAKE 1 TABLET EVERY DAY BEFORE BREAKFAST   Fluticasone-Umeclidin-Vilant (TRELEGY ELLIPTA) 100-62.5-25 MCG/ACT AEPB INHALE 1 PUFF INTO THE LUNGS DAILY   gabapentin (NEURONTIN) 600 MG tablet TAKE 1 TABLET THREE TIMES DAILY   metoprolol  succinate (TOPROL-XL) 25 MG 24 hr tablet Take 1 tablet (25 mg total) by mouth daily.   omeprazole (PRILOSEC) 20 MG capsule TAKE 1 CAPSULE EVERY DAY   ondansetron (ZOFRAN-ODT) 4 MG disintegrating tablet Take 1 tablet (4 mg total) by mouth every 8 (eight) hours as needed for nausea or vomiting.   sildenafil (VIAGRA) 50 MG tablet TAKE 1 TABLET EVERY DAY AS NEEDED FOR ERECTILE DYSFUNCTION   albuterol (VENTOLIN HFA) 108 (90 Base) MCG/ACT inhaler  (Patient not taking: Reported on 02/28/2023)   furosemide (LASIX) 20 MG tablet Take 1 tablet (20 mg total) by mouth daily. (Patient not taking: Reported on 01/24/2023)   ibuprofen (ADVIL) 200 MG tablet Take 200 mg by mouth every 6 (six) hours as needed. (Patient not taking: Reported on 02/28/2023)   potassium chloride SA (KLOR-CON M20) 20 MEQ tablet TAKE 1 TABLET BY MOUTH 2 TIMES A DAY (Patient not taking: Reported on 02/28/2023)   No facility-administered encounter medications on file as of 02/28/2023.    Allergies (verified) Chantix [varenicline]   History: Past Medical History:  Diagnosis Date   Arthritis    Rheumatoid   Bipolar disorder (HCC)    Complication of anesthesia    difficulty keeping patient asleep. fentanyl makes patient mean, grumpy and difficult   Depression    Fusion of spine    GERD (gastroesophageal reflux disease)    slightly uncomfortable. takes otc antacids occasionally  Headache    deteriorating discs causing headaches. uses advil   Hepatitis C    HEP "C". treated 2 years ago   History of kidney stones 2016   Hyperlipidemia    Pericarditis    20 years ago   Peripheral vascular disease (HCC)    Smoker    Past Surgical History:  Procedure Laterality Date   ABDOMINAL AORTOGRAM W/LOWER EXTREMITY N/A 01/15/2017   Procedure: Abdominal Aortogram w/Lower Extremity;  Surgeon: Renford Dills, MD;  Location: [facility] INVASIVE [facility];  Service: Cardiovascular;  Laterality: N/A;   BACK SURGERY  2004   acdf, titanium c5-6    FEMORAL-POPLITEAL BYPASS GRAFT Right 06/19/2017   Procedure: BYPASS GRAFT FEMORAL-POPLITEAL ARTERY;  Surgeon: Renford Dills, MD;  Location: [facility] ORS;  Service: Vascular;  Laterality: Right;   LOWER EXTREMITY ANGIOGRAPHY Right 01/15/2017   Procedure: Lower Extremity Angiography;  Surgeon: Renford Dills, MD;  Location: [facility];  Service: Cardiovascular;  Laterality: Right;   LOWER EXTREMITY ANGIOGRAPHY Right 02/13/2017   Procedure: Lower Extremity Angiography;  Surgeon: Renford Dills, MD;  Location: [facility];  Service: Cardiovascular;  Laterality: Right;   Family History  Problem Relation Age of Onset   Cancer Mother        liver   Heart disease Father    Diabetes Father    Breast cancer Sister    Brain cancer Sister    Social History   Socioeconomic History   Marital status: Single    Spouse name: Not on file   Number of children: Not on file   Years of education: Not on file   Highest education level: Not on file  Occupational History   Not on file  Tobacco Use   Smoking status: Every Day    Packs/day: 0.50    Years: 40.00    Additional pack years: 0.00    Total pack years: 20.00    Types: Cigarettes   Smokeless tobacco: Never  Vaping Use   Vaping Use: Former  Substance and Sexual Activity   Alcohol use: Not Currently    Alcohol/week: 2.0 standard drinks of alcohol    Types: 2 Shots of liquor per week    Comment: occasional (every 3-4 weeks)    Drug use: No    Comment: polysubstance approx 2 years ago   Sexual activity: Not Currently  Other Topics Concern   Not on file  Social History Narrative   Not on file   Social Determinants of Health   Financial Resource Strain: Low Risk  (02/28/2023)   Overall Financial Resource Strain (CARDIA)    Difficulty of Paying Living Expenses: Not hard at all  Food Insecurity: No Food Insecurity (02/28/2023)   Hunger Vital Sign    Worried About Running Out of Food in the Last Year: Never true     Ran Out of Food in the Last Year: Never true  Transportation Needs: No Transportation Needs (02/28/2023)   PRAPARE - Administrator, Civil Service (Medical): No    Lack of Transportation (Non-Medical): No  Physical Activity: Inactive (02/28/2023)   Exercise Vital Sign    Days of Exercise per Week: 0 days    Minutes of Exercise per Session: 0 min  Stress: No Stress Concern Present (02/28/2023)   [employer] of Occupational Health - Occupational Stress Questionnaire    Feeling of Stress : Not at all  Social Connections: Moderately Isolated (02/28/2023)   Social Connection and Isolation Panel [  NHANES]    Frequency of Communication with Friends and Family: More than three times a week    Frequency of Social Gatherings with Friends and Family: Once a week    Attends Religious Services: Never    Database administrator or Organizations: No    Attends Engineer, structural: Never    Marital Status: Married    Tobacco Counseling Ready to quit: Yes Counseling given: Not Answered   Clinical Intake:  Pre-visit preparation completed: Yes  Pain : 0-10 Pain Score: 6  Pain Type: Chronic pain Pain Location: Foot Pain Orientation: Left Pain Descriptors / Indicators: Burning, Tingling Pain Onset: More than a month ago Pain Frequency: Constant     Nutritional Status: BMI of 19-24  Normal Nutritional Risks: Nausea/ vomitting/ diarrhea (nausea off and on) Diabetes: Yes CBG done?: No Did pt. bring in CBG monitor from home?: No  How often do you need to have someone help you when you read instructions, pamphlets, or other written materials from your doctor or pharmacy?: 1 - Never  Interpreter Needed?: No  Information entered by :: NAllen LPN   Activities of Daily Living    02/28/2023   11:29 AM 01/11/2023   11:28 AM  In your present state of health, do you have any difficulty performing the following activities:  Hearing? 1 1  Comment has hearing aids but  does not wear often   Vision? 0 1  Comment wears glasses   Difficulty concentrating or making decisions? 0 0  Walking or climbing stairs? 1 1  Comment due to foot   Dressing or bathing? 0 0  Doing errands, shopping? 0 1  Preparing Food and eating ? N   Using the Toilet? N   In the past six months, have you accidently leaked urine? Y   Comment occasionally   Do you have problems with loss of bowel control? N   Managing your Medications? N   Managing your Finances? N   Housekeeping or managing your Housekeeping? N     Patient Care Team: Lorre Munroe, NP as PCP - General (Internal Medicine)  Indicate any recent Medical Services you may have received from other than [facility] providers in the past year (date may be approximate).     Assessment:   This is a routine wellness examination for Ricardo Cervantes.  Hearing/Vision screen Hearing Screening - Comments:: Has hearing aids but does not wear Vision Screening - Comments:: No Regular eye exams since cataract surgery  Dietary issues and exercise activities discussed:     Goals Addressed             This Visit's Progress    Patient Stated       02/28/2023, staying alive       Depression Screen    02/28/2023   11:43 AM 01/11/2023   11:28 AM 12/07/2021    2:29 PM 07/31/2021    1:29 PM 01/06/2021    3:48 PM 09/19/2017    2:50 PM 03/12/2017    2:51 PM  PHQ 2/9 Scores  PHQ - 2 Score 0 0 0 0 0 3 0  PHQ- 9 Score 1   0 6 7     Fall Risk    02/28/2023   11:40 AM 01/11/2023   11:28 AM 01/10/2023    3:40 PM 12/07/2021    2:33 PM 01/06/2021    3:49 PM  Fall Risk   Falls in the past year? 1 1 1  0 0  Comment  leg gives out and lost balance      Number falls in past yr: 1 1 1  0 0  Injury with Fall? 1 1 1  0   Comment busted lip and swollen foot      Risk for fall due to : Impaired mobility;Impaired balance/gait;Medication side effect Impaired balance/gait  No Fall Risks No Fall Risks  Follow up Falls prevention discussed;Falls evaluation  completed   Falls evaluation completed     MEDICARE RISK AT HOME:  Medicare Risk at Home - 02/28/23 1142     Any stairs in or around the home? Yes    If so, are there any without handrails? Yes    Home free of loose throw rugs in walkways, pet beds, electrical cords, etc? Yes    Adequate lighting in your home to reduce risk of falls? Yes    Life alert? No    Use of a cane, walker or w/c? Yes    Grab bars in the bathroom? No    Shower chair or bench in shower? No    Elevated toilet seat or a handicapped toilet? No             TIMED UP AND GO:  Was the test performed?  No    Cognitive Function:        02/28/2023   11:45 AM 12/07/2021    2:35 PM 03/12/2017    3:01 PM  6CIT Screen  What Year? 0 points 0 points 0 points  What month? 0 points 0 points 0 points  What time? 0 points 0 points 0 points  Count back from 20 0 points 0 points 0 points  Months in reverse 0 points 0 points 0 points  Repeat phrase 6 points 2 points 0 points  Total Score 6 points 2 points 0 points    Immunizations Immunization History  Administered Date(s) Administered   Influenza-Unspecified 09/16/2020   Moderna Sars-Covid-2 Vaccination 09/14/2020   PFIZER Comirnaty(Gray Top)Covid-19 Tri-Sucrose Vaccine 10/12/2020    TDAP status: Due, Education has been provided regarding the importance of this vaccine. Advised may receive this vaccine at local pharmacy or Health Dept. Aware to provide a copy of the vaccination record if obtained from local pharmacy or Health Dept. Verbalized acceptance and understanding.  Flu Vaccine status: Up to date  Pneumococcal vaccine status: Declined,  Education has been provided regarding the importance of this vaccine but patient still declined. Advised may receive this vaccine at local pharmacy or Health Dept. Aware to provide a copy of the vaccination record if obtained from local pharmacy or Health Dept. Verbalized acceptance and understanding.   Covid-19 vaccine  status: Information provided on how to obtain vaccines.   Qualifies for Shingles Vaccine? Yes   Zostavax completed No   Shingrix Completed?: No.    Education has been provided regarding the importance of this vaccine. Patient has been advised to call insurance company to determine out of pocket expense if they have not yet received this vaccine. Advised may also receive vaccine at local pharmacy or Health Dept. Verbalized acceptance and understanding.  Screening Tests Health Maintenance  Topic Date Due   OPHTHALMOLOGY EXAM  Never done   Diabetic kidney evaluation - Urine ACR  Never done   DTaP/Tdap/Td (1 - Tdap) Never done   Lung Cancer Screening  Never done   COVID-19 Vaccine (3 - Mixed Product risk series) 11/09/2020   Zoster Vaccines- Shingrix (1 of 2) 04/12/2023 (Originally 10/23/1974)   Pneumonia Vaccine 65+ Years  old (1 of 2 - PCV) 01/10/2024 (Originally 10/24/1961)   Colonoscopy  01/10/2024 (Originally 10/23/2002)   INFLUENZA VACCINE  04/04/2023   HEMOGLOBIN A1C  07/13/2023   Diabetic kidney evaluation - eGFR measurement  01/10/2024   FOOT EXAM  01/24/2024   Medicare Annual Wellness (AWV)  02/28/2024   Hepatitis C Screening  Completed   HIV Screening  Completed   HPV VACCINES  Aged Out    Health Maintenance  Health Maintenance Due  Topic Date Due   OPHTHALMOLOGY EXAM  Never done   Diabetic kidney evaluation - Urine ACR  Never done   DTaP/Tdap/Td (1 - Tdap) Never done   Lung Cancer Screening  Never done   COVID-19 Vaccine (3 - Mixed Product risk series) 11/09/2020    Colorectal cancer screening: No longer required. declines  Lung Cancer Screening: (Low Dose CT Chest recommended if Age 32-80 years, 20 pack-year currently smoking OR have quit w/in 15years.) does not qualify.   Lung Cancer Screening Referral: no  Additional Screening:  Hepatitis C Screening: does qualify; Completed 01/06/2021  Vision Screening: Recommended annual ophthalmology exams for early detection  of glaucoma and other disorders of the eye. Is the patient up to date with their annual eye exam?  No  Who is the provider or what is the name of the office in which the patient attends annual eye exams? none If pt is not established with a provider, would they like to be referred to a provider to establish care? No .   Dental Screening: Recommended annual dental exams for proper oral hygiene  Diabetic Foot Exam: Diabetic Foot Exam: Completed 01/24/2023  Community Resource Referral / Chronic Care Management: CRR required this visit?  No   CCM required this visit?  No     Plan:     I have personally reviewed and noted the following in the patient's chart:   Medical and social history Use of alcohol, tobacco or illicit drugs  Current medications and supplements including opioid prescriptions. Patient is not currently taking opioid prescriptions. Functional ability and status Nutritional status Physical activity Advanced directives List of other physicians Hospitalizations, surgeries, and ER visits in previous 12 months Vitals Screenings to include cognitive, depression, and falls Referrals and appointments  In addition, I have reviewed and discussed with patient certain preventive protocols, quality metrics, and best practice recommendations. A written personalized care plan for preventive services as well as general preventive health recommendations were provided to patient.     Barb Merino, LPN   02/07/3015   After Visit Summary: (MyChart) Due to this being a telephonic visit, the after visit summary with patients personalized plan was offered to patient via MyChart   Nurse Notes: none

## 2023-03-11 ENCOUNTER — Other Ambulatory Visit: Payer: Self-pay | Admitting: Internal Medicine

## 2023-03-11 DIAGNOSIS — G629 Polyneuropathy, unspecified: Secondary | ICD-10-CM

## 2023-03-11 NOTE — Telephone Encounter (Signed)
Rx: Eliquis 10/15/22 #180 1RF- too soon Jardiance 10/15/22 #90 1RF- too soon Requested Prescriptions  Pending Prescriptions Disp Refills   TRELEGY ELLIPTA 100-62.5-25 MCG/ACT AEPB [Pharmacy Med Name: Dwyane Luo 100-62.5-25 MCG/ACT Aerosol Powder Breath Activated] 180 each 3    Sig: INHALE 1 PUFF INTO THE LUNGS DAILY     Off-Protocol Failed - 03/11/2023  3:18 AM      Failed - Medication not assigned to a protocol, review manually.      Passed - Valid encounter within last 12 months    Recent Outpatient Visits           1 month ago Hyperlipidemia associated with type 2 diabetes mellitus [facility])   [place] [facility] [place], [place] W, NP   2 months ago Mixed hyperlipidemia   [place] [facility] [place], [place], NP   9 months ago Prediabetes   [place] [facility] [place], Salvadore [place], NP   1 year ago Peripheral polyneuropathy   [place] [facility] [place], Salvadore Oxford, NP   1 year ago Mixed hyperlipidemia   [place] [facility] [place], Salvadore Oxford, NP       Future Appointments             In 1 month [place], Salvadore Oxford, NP [place] [facility]             JARDIANCE 10 MG TABS tablet [Pharmacy Med Name: JARDIANCE 10 MG Tablet] 90 tablet 3    Sig: TAKE 1 TABLET EVERY DAY BEFORE BREAKFAST     Endocrinology:  Diabetes - SGLT2 Inhibitors Passed - 03/11/2023  3:18 AM      Passed - Cr in normal range and within 360 days    Creat  Date Value Ref Range Status  01/10/2023 0.81 0.70 - 1.35 mg/dL Final         Passed - HBA1C is between 0 and 7.9 and within 180 days    Hgb A1c MFr Bld  Date Value Ref Range Status  01/10/2023 7.9 (H) <5.7 % of total Hgb Final    Comment:    For someone without known diabetes, a hemoglobin A1c value of 6.5% or greater indicates that they may have  diabetes and this should be confirmed with a follow-up  test. . For someone with known diabetes, a  value <7% indicates  that their diabetes is well controlled and a value  greater than or equal to 7% indicates suboptimal  control. A1c targets should be individualized based on  duration of diabetes, age, comorbid conditions, and  other considerations. . Currently, no consensus exists regarding use of hemoglobin A1c for diagnosis of diabetes for children. .          Passed - eGFR in normal range and within 360 days    GFR, Est African American  Date Value Ref Range Status  12/18/2017 109 > OR = 60 mL/min/1.38m2 Final   GFR, Est Non African American  Date Value Ref Range Status  12/18/2017 94 > OR = 60 mL/min/1.1m2 Final   eGFR  Date Value Ref Range Status  01/10/2023 98 > OR = 60 mL/min/1.38m2 Final  11/14/2020 105 >59 mL/min/1.73 Final         Passed - Valid encounter within last 6 months    Recent Outpatient Visits           1 month ago Hyperlipidemia associated with type 2  diabetes mellitus [place] [facility])   [place] [facility] [place], [place] W, NP   2 months ago Mixed hyperlipidemia   [place] [facility] [place], [place], NP   9 months ago Prediabetes   [place] [facility] [place], [place], NP   1 year ago Peripheral polyneuropathy   [place] [facility] [place], [place], NP   1 year ago Mixed hyperlipidemia   [place] [facility] [place], Salvadore Oxford, NP       Future Appointments             In 1 month [place], Salvadore Oxford, NP [place] [facility], [facility]             ELIQUIS 5 MG TABS tablet [Pharmacy Med Name: ELIQUIS 5 MG Tablet] 180 tablet 3    Sig: TAKE 1 TABLET TWICE DAILY     Hematology:  Anticoagulants - apixaban Passed - 03/11/2023  3:18 AM      Passed - PLT in normal range and within 360 days    Platelets  Date Value Ref Range Status  01/10/2023 277 140 - 400 Thousand/uL Final  11/14/2020 287 150 - 450 x10E3/uL Final         Passed - HGB in normal  range and within 360 days    Hemoglobin  Date Value Ref Range Status  01/10/2023 14.5 13.2 - 17.1 g/dL Final  29/56/2130 86.5 (L) 13.0 - 17.7 g/dL Final         Passed - HCT in normal range and within 360 days    HCT  Date Value Ref Range Status  01/10/2023 42.6 38.5 - 50.0 % Final   Hematocrit  Date Value Ref Range Status  11/14/2020 40.0 37.5 - 51.0 % Final         Passed - Cr in normal range and within 360 days    Creat  Date Value Ref Range Status  01/10/2023 0.81 0.70 - 1.35 mg/dL Final         Passed - AST in normal range and within 360 days    AST  Date Value Ref Range Status  01/10/2023 12 10 - 35 U/L Final         Passed - ALT in normal range and within 360 days    ALT  Date Value Ref Range Status  01/10/2023 22 9 - 46 U/L Final         Passed - Valid encounter within last 12 months    Recent Outpatient Visits           1 month ago Hyperlipidemia associated with type 2 diabetes mellitus [facility])   [place] [facility] [place], Salvadore Oxford, NP   2 months ago Mixed hyperlipidemia   [place] [facility] [place], Salvadore Oxford, NP   9 months ago Prediabetes   [place] [facility] [place], Salvadore Oxford, NP   1 year ago Peripheral polyneuropathy   [place] [facility] [place], Salvadore Oxford, NP   1 year ago Mixed hyperlipidemia   [place] [facility] [place], Salvadore Oxford, NP       Future Appointments             In 1 month [place], Salvadore Oxford, NP [place] [facility]

## 2023-03-11 NOTE — Telephone Encounter (Signed)
Requested medications are due for refill today.  yes  Requested medications are on the active medications list.  yes  Last refill. 01/24/2023 #30 0 rf  Future visit scheduled.   yes  Notes to clinic.  Refill not delegated.    Requested Prescriptions  Pending Prescriptions Disp Refills   ondansetron (ZOFRAN-ODT) 4 MG disintegrating tablet [Pharmacy Med Name: ONDANSETRON 4 MG ODT] 30 tablet 0    Sig: TAKE 1 TABLET UNDER THE TONGUE EVERY 8 HOURS AS NEEDED NAUSEA AND VOMITING     Not Delegated - Gastroenterology: Antiemetics - ondansetron Failed - 03/11/2023 10:19 AM      Failed - This refill cannot be delegated      Passed - AST in normal range and within 360 days    AST  Date Value Ref Range Status  01/10/2023 12 10 - 35 U/L Final         Passed - ALT in normal range and within 360 days    ALT  Date Value Ref Range Status  01/10/2023 22 9 - 46 U/L Final         Passed - Valid encounter within last 6 months    Recent Outpatient Visits           1 month ago Hyperlipidemia associated with type 2 diabetes mellitus [facility]   [place] [facility] [place], Salvadore Oxford, NP   2 months ago Mixed hyperlipidemia   [place] [facility] [place], Salvadore Oxford, NP   9 months ago Prediabetes   [place] [facility] [place], Salvadore Oxford, NP   1 year ago Peripheral polyneuropathy   [place] [facility] [place], Salvadore Oxford, NP   1 year ago Mixed hyperlipidemia   [place] [facility] [place], Salvadore Oxford, NP       Future Appointments             In 1 month [place], Salvadore Oxford, NP [place] [facility], [facility]

## 2023-03-11 NOTE — Telephone Encounter (Signed)
Requested medication (s) are due for refill today -no  Requested medication (s) are on the active medication list -yes  Future visit scheduled -yes  Last refill: 10/15/22 180 1RF  Notes to clinic: off protocol- provider review   Requested Prescriptions  Pending Prescriptions Disp Refills   TRELEGY ELLIPTA 100-62.5-25 MCG/ACT AEPB [Pharmacy Med Name: Dwyane Luo 100-62.5-25 MCG/ACT Aerosol Powder Breath Activated] 180 each 3    Sig: INHALE 1 PUFF INTO THE LUNGS DAILY     Off-Protocol Failed - 03/11/2023  3:18 AM      Failed - Medication not assigned to a protocol, review manually.      Passed - Valid encounter within last 12 months    Recent Outpatient Visits           1 month ago Hyperlipidemia associated with type 2 diabetes mellitus [facility])   [place] [facility] [place], [place] W, NP   2 months ago Mixed hyperlipidemia   [place] [facility] [place], [place], NP   9 months ago Prediabetes   [place] [facility] [place], Salvadore Oxford, NP   1 year ago Peripheral polyneuropathy   [place] [facility] [place], Salvadore Oxford, NP   1 year ago Mixed hyperlipidemia   [place] [place], Salvadore Oxford, NP       Future Appointments             In 1 month [place], Salvadore Oxford, NP [facility]            Refused Prescriptions Disp Refills   JARDIANCE 10 MG TABS tablet [Pharmacy Med Name: JARDIANCE 10 MG Tablet] 90 tablet 3    Sig: TAKE 1 TABLET EVERY DAY BEFORE BREAKFAST     Endocrinology:  Diabetes - SGLT2 Inhibitors Passed - 03/11/2023  3:18 AM      Passed - Cr in normal range and within 360 days    Creat  Date Value Ref Range Status  01/10/2023 0.81 0.70 - 1.35 mg/dL Final         Passed - HBA1C is between 0 and 7.9 and within 180 days    Hgb A1c MFr Bld  Date Value Ref Range Status  01/10/2023 7.9 (H) <5.7 % of total Hgb Final    Comment:    For someone  without known diabetes, a hemoglobin A1c value of 6.5% or greater indicates that they may have  diabetes and this should be confirmed with a follow-up  test. . For someone with known diabetes, a value <7% indicates  that their diabetes is well controlled and a value  greater than or equal to 7% indicates suboptimal  control. A1c targets should be individualized based on  duration of diabetes, age, comorbid conditions, and  other considerations. . Currently, no consensus exists regarding use of hemoglobin A1c for diagnosis of diabetes for children. .          Passed - eGFR in normal range and within 360 days    GFR, Est African American  Date Value Ref Range Status  12/18/2017 109 > OR = 60 mL/min/1.68m2 Final   GFR, Est Non African American  Date Value Ref Range Status  12/18/2017 94 > OR = 60 mL/min/1.68m2 Final   eGFR  Date Value Ref Range Status  01/10/2023 98 > OR = 60 mL/min/1.1m2 Final  11/14/2020 105 >59 mL/min/1.73 Final  Passed - Valid encounter within last 6 months    Recent Outpatient Visits           1 month ago Hyperlipidemia associated with type 2 diabetes mellitus [facility])   [place] [facility] [place], [place] W, NP   2 months ago Mixed hyperlipidemia   [place] [facility] [place], Salvadore Oxford, NP   9 months ago Prediabetes   [place] [facility] [place], Salvadore Oxford, NP   1 year ago Peripheral polyneuropathy   [place] [facility] [place], Salvadore Oxford, NP   1 year ago Mixed hyperlipidemia   [place] [facility] [place], Salvadore Oxford, NP       Future Appointments             In 1 month [place], Salvadore Oxford, NP [facility]             ELIQUIS 5 MG TABS tablet [Pharmacy Med Name: ELIQUIS 5 MG Tablet] 180 tablet 3    Sig: TAKE 1 TABLET TWICE DAILY     Hematology:  Anticoagulants - apixaban Passed - 03/11/2023  3:18 AM      Passed - PLT  in normal range and within 360 days    Platelets  Date Value Ref Range Status  01/10/2023 277 140 - 400 Thousand/uL Final  11/14/2020 287 150 - 450 x10E3/uL Final         Passed - HGB in normal range and within 360 days    Hemoglobin  Date Value Ref Range Status  01/10/2023 14.5 13.2 - 17.1 g/dL Final  16/06/9603 54.0 (L) 13.0 - 17.7 g/dL Final         Passed - HCT in normal range and within 360 days    HCT  Date Value Ref Range Status  01/10/2023 42.6 38.5 - 50.0 % Final   Hematocrit  Date Value Ref Range Status  11/14/2020 40.0 37.5 - 51.0 % Final         Passed - Cr in normal range and within 360 days    Creat  Date Value Ref Range Status  01/10/2023 0.81 0.70 - 1.35 mg/dL Final         Passed - AST in normal range and within 360 days    AST  Date Value Ref Range Status  01/10/2023 12 10 - 35 U/L Final         Passed - ALT in normal range and within 360 days    ALT  Date Value Ref Range Status  01/10/2023 22 9 - 46 U/L Final         Passed - Valid encounter within last 12 months    Recent Outpatient Visits           1 month ago Hyperlipidemia associated with type 2 diabetes mellitus [facility])   [place] [facility] [place], Salvadore Oxford, NP   2 months ago Mixed hyperlipidemia   [place] [facility] [place], Salvadore Oxford, NP   9 months ago Prediabetes   [place] [facility] [place], Salvadore Oxford, NP   1 year ago Peripheral polyneuropathy   [place] [facility] [place], Salvadore Oxford, NP   1 year ago Mixed hyperlipidemia    [facility] [place], Salvadore Oxford, NP       Future Appointments  In 1 month [place], Salvadore Oxford, NP [facility], [facility]               Requested Prescriptions  Pending Prescriptions Disp Refills   TRELEGY ELLIPTA 100-62.5-25 MCG/ACT AEPB [Pharmacy Med Name: Dwyane Luo 100-62.5-25 MCG/ACT Aerosol Powder Breath Activated]  180 each 3    Sig: INHALE 1 PUFF INTO THE LUNGS DAILY     Off-Protocol Failed - 03/11/2023  3:18 AM      Failed - Medication not assigned to a protocol, review manually.      Passed - Valid encounter within last 12 months    Recent Outpatient Visits           1 month ago Hyperlipidemia associated with type 2 diabetes mellitus [facility])   [place] [facility] [place], [place] W, NP   2 months ago Mixed hyperlipidemia   [place] [facility] [place], [place], NP   9 months ago Prediabetes   [place] [facility] [place], Salvadore Oxford, NP   1 year ago Peripheral polyneuropathy   [place] [facility] [place], Salvadore Oxford, NP   1 year ago Mixed hyperlipidemia   [place] [facility] [place], Salvadore Oxford, NP       Future Appointments             In 1 month [place], Salvadore Oxford, NP [place] [facility], [facility]            Refused Prescriptions Disp Refills   JARDIANCE 10 MG TABS tablet [Pharmacy Med Name: JARDIANCE 10 MG Tablet] 90 tablet 3    Sig: TAKE 1 TABLET EVERY DAY BEFORE BREAKFAST     Endocrinology:  Diabetes - SGLT2 Inhibitors Passed - 03/11/2023  3:18 AM      Passed - Cr in normal range and within 360 days    Creat  Date Value Ref Range Status  01/10/2023 0.81 0.70 - 1.35 mg/dL Final         Passed - HBA1C is between 0 and 7.9 and within 180 days    Hgb A1c MFr Bld  Date Value Ref Range Status  01/10/2023 7.9 (H) <5.7 % of total Hgb Final    Comment:    For someone without known diabetes, a hemoglobin A1c value of 6.5% or greater indicates that they may have  diabetes and this should be confirmed with a follow-up  test. . For someone with known diabetes, a value <7% indicates  that their diabetes is well controlled and a value  greater than or equal to 7% indicates suboptimal  control. A1c targets should be individualized based on  duration of diabetes, age, comorbid conditions,  and  other considerations. . Currently, no consensus exists regarding use of hemoglobin A1c for diagnosis of diabetes for children. .          Passed - eGFR in normal range and within 360 days    GFR, Est African American  Date Value Ref Range Status  12/18/2017 109 > OR = 60 mL/min/1.64m2 Final   GFR, Est Non African American  Date Value Ref Range Status  12/18/2017 94 > OR = 60 mL/min/1.46m2 Final   eGFR  Date Value Ref Range Status  01/10/2023 98 > OR = 60 mL/min/1.55m2 Final  11/14/2020 105 >59 mL/min/1.73 Final         Passed - Valid encounter within last 6 months  Recent Outpatient Visits           1 month ago Hyperlipidemia associated with type 2 diabetes mellitus [facility])   [place] [place], [place] W, NP   2 months ago Mixed hyperlipidemia   [place] [facility] [place], Salvadore Oxford, NP   9 months ago Prediabetes   [place] [facility] [place], Salvadore Oxford, NP   1 year ago Peripheral polyneuropathy   [place] [facility] [place], Salvadore Oxford, NP   1 year ago Mixed hyperlipidemia   [place] [facility] [place], Salvadore Oxford, NP       Future Appointments             In 1 month [place], Salvadore Oxford, NP [place] [facility]             ELIQUIS 5 MG TABS tablet [Pharmacy Med Name: ELIQUIS 5 MG Tablet] 180 tablet 3    Sig: TAKE 1 TABLET TWICE DAILY     Hematology:  Anticoagulants - apixaban Passed - 03/11/2023  3:18 AM      Passed - PLT in normal range and within 360 days    Platelets  Date Value Ref Range Status  01/10/2023 277 140 - 400 Thousand/uL Final  11/14/2020 287 150 - 450 x10E3/uL Final         Passed - HGB in normal range and within 360 days    Hemoglobin  Date Value Ref Range Status  01/10/2023 14.5 13.2 - 17.1 g/dL Final  16/06/9603 54.0 (L) 13.0 - 17.7 g/dL Final         Passed - HCT in normal range and within 360 days    HCT  Date  Value Ref Range Status  01/10/2023 42.6 38.5 - 50.0 % Final   Hematocrit  Date Value Ref Range Status  11/14/2020 40.0 37.5 - 51.0 % Final         Passed - Cr in normal range and within 360 days    Creat  Date Value Ref Range Status  01/10/2023 0.81 0.70 - 1.35 mg/dL Final         Passed - AST in normal range and within 360 days    AST  Date Value Ref Range Status  01/10/2023 12 10 - 35 U/L Final         Passed - ALT in normal range and within 360 days    ALT  Date Value Ref Range Status  01/10/2023 22 9 - 46 U/L Final         Passed - Valid encounter within last 12 months    Recent Outpatient Visits           1 month ago Hyperlipidemia associated with type 2 diabetes mellitus [facility])   [place] [facility] [place], Salvadore Oxford, NP   2 months ago Mixed hyperlipidemia   [place] [facility] [place], Salvadore Oxford, NP   9 months ago Prediabetes   [place] [facility] [place], Salvadore Oxford, NP   1 year ago Peripheral polyneuropathy   [place] [facility] [place], Salvadore Oxford, NP   1 year ago Mixed hyperlipidemia   [place] [facility] [place], Salvadore Oxford, NP       Future Appointments             In 1 month Lorre Munroe,  NP [place] [facility], [place]

## 2023-03-12 ENCOUNTER — Other Ambulatory Visit: Payer: Self-pay | Admitting: Internal Medicine

## 2023-03-12 NOTE — Telephone Encounter (Unsigned)
Copied from CRM [id number]. Topic: General - Other >> Mar 12, 2023  1:48 PM Everette C wrote: Reason for CRM: Medication Refill - Medication: DULoxetine (CYMBALTA) 60 MG capsule [[id number]]  omeprazole (PRILOSEC) 20 MG capsule [[id number]]  digoxin (LANOXIN) 0.25 MG tablet [[id number]]  sildenafil (VIAGRA) 50 MG tablet [[id number]]  Has the patient contacted their pharmacy? Yes.   (Agent: If no, request that the patient contact the pharmacy for the refill. If patient does not wish to contact the pharmacy document the reason why and proceed with request.) (Agent: If yes, when and what did the pharmacy advise?)  Preferred Pharmacy (with phone number or street name): [facility] - [place], [place] - [address] [place] [address] Phone: [phone] Fax: [phone] Hours: Not open 24 hours   Has the patient been seen for an appointment in the last year OR does the patient have an upcoming appointment? Yes.    Agent: Please be advised that RX refills may take up to 3 business days. We ask that you follow-up with your pharmacy.

## 2023-03-13 MED ORDER — DIGOXIN 250 MCG PO TABS: 250.0000 ug | ORAL_TABLET | Freq: Every day | ORAL | 0 refills | Status: DC

## 2023-03-13 MED ORDER — SILDENAFIL CITRATE 50 MG PO TABS: ORAL_TABLET | ORAL | 0 refills | Status: DC

## 2023-03-13 NOTE — Telephone Encounter (Signed)
Unable to refill per protocol, medication requests are too soon. Last refill 02/01/23 for 90 and 3 refills.  Requested Prescriptions  Pending Prescriptions Disp Refills   omeprazole (PRILOSEC) 20 MG capsule 90 capsule 3    Sig: Take 1 capsule (20 mg total) by mouth daily.     Gastroenterology: Proton Pump Inhibitors Passed - 03/12/2023  2:23 PM      Passed - Valid encounter within last 12 months    Recent Outpatient Visits           1 month ago Hyperlipidemia associated with type 2 diabetes mellitus [facility])   [place] [facility] [facility] [place], Salvadore Oxford, NP   2 months ago Mixed hyperlipidemia   [place] [facility] [place], Salvadore Oxford, NP   9 months ago Prediabetes   [place] [facility] [place], Salvadore Oxford, NP   1 year ago Peripheral polyneuropathy   [place] [facility] [place], Salvadore Oxford, NP   1 year ago Mixed hyperlipidemia   [place] [facility] [place], Salvadore Oxford, NP       Future Appointments             In 1 month [place], Salvadore Oxford, NP [facility], [facility]             DULoxetine (CYMBALTA) 60 MG capsule 90 capsule 1    Sig: Take 1 capsule (60 mg total) by mouth daily.     Psychiatry: Antidepressants - SNRI - duloxetine Passed - 03/12/2023  2:23 PM      Passed - Cr in normal range and within 360 days    Creat  Date Value Ref Range Status  01/10/2023 0.81 0.70 - 1.35 mg/dL Final         Passed - eGFR is 30 or above and within 360 days    GFR, Est African American  Date Value Ref Range Status  12/18/2017 109 > OR = 60 mL/min/1.68m2 Final   GFR, Est Non African American  Date Value Ref Range Status  12/18/2017 94 > OR = 60 mL/min/1.30m2 Final   eGFR  Date Value Ref Range Status  01/10/2023 98 > OR = 60 mL/min/1.69m2 Final  11/14/2020 105 >59 mL/min/1.73 Final         Passed - Completed PHQ-2 or PHQ-9 in the last 360 days      Passed - Last BP in normal range     BP Readings from Last 1 Encounters:  01/24/23 93/67         Passed - Valid encounter within last 6 months    Recent Outpatient Visits           1 month ago Hyperlipidemia associated with type 2 diabetes mellitus [facility])   [place] [facility] [place], Salvadore Oxford, NP   2 months ago Mixed hyperlipidemia   [place] [facility] [place], Salvadore Oxford, NP   9 months ago Prediabetes   [place] [facility] [place], Salvadore Oxford, NP   1 year ago Peripheral polyneuropathy   [place] [facility] [place], Salvadore Oxford, NP   1 year ago Mixed hyperlipidemia   [place] [facility] [place], Salvadore Oxford, NP       Future Appointments             In 1 month [place], Salvadore Oxford, NP [place] [facility],  [facility]            Signed Prescriptions Disp Refills   sildenafil (VIAGRA) 50 MG tablet 90 tablet 0    Sig: TAKE 1 TABLET EVERY DAY AS NEEDED FOR ERECTILE DYSFUNCTION     Urology: Erectile Dysfunction Agents Passed - 03/12/2023  2:23 PM      Passed - AST in normal range and within 360 days    AST  Date Value Ref Range Status  01/10/2023 12 10 - 35 U/L Final         Passed - ALT in normal range and within 360 days    ALT  Date Value Ref Range Status  01/10/2023 22 9 - 46 U/L Final         Passed - Last BP in normal range    BP Readings from Last 1 Encounters:  01/24/23 93/67         Passed - Valid encounter within last 12 months    Recent Outpatient Visits           1 month ago Hyperlipidemia associated with type 2 diabetes mellitus [facility])   [place] [facility] [place], Salvadore Oxford, NP   2 months ago Mixed hyperlipidemia   [place] [facility] [place], Salvadore Oxford, NP   9 months ago Prediabetes   [place] [facility] [place], Salvadore Oxford, NP   1 year ago Peripheral polyneuropathy   [place] [facility] [place], Salvadore Oxford, NP   1 year ago  Mixed hyperlipidemia   [place] [facility] [place], Salvadore Oxford, NP       Future Appointments             In 1 month [place], Salvadore Oxford, NP [facility], [facility]             digoxin (LANOXIN) 0.25 MG tablet 90 tablet 0    Sig: Take 1 tablet (250 mcg total) by mouth daily.     Cardiovascular:  Antiarrhythmic Agents - digoxin Failed - 03/12/2023  2:23 PM      Failed - Digoxin (serum) in normal range and within 360 days    No results found for: "DIGOXIN"       Failed - Mg Level in normal range and within 360 days    Magnesium  Date Value Ref Range Status  12/14/2021 2.0 1.5 - 2.5 mg/dL Final         Failed - Patient had ECG in the last 360 days      Passed - Cr in normal range and within 180 days    Creat  Date Value Ref Range Status  01/10/2023 0.81 0.70 - 1.35 mg/dL Final         Passed - Ca in normal range and within 360 days    Calcium  Date Value Ref Range Status  01/10/2023 9.1 8.6 - 10.3 mg/dL Final         Passed - K in normal range and within 180 days    Potassium  Date Value Ref Range Status  01/10/2023 4.8 3.5 - 5.3 mmol/L Final         Passed - Last Heart Rate in normal range    Pulse Readings from Last 1 Encounters:  01/24/23 (!) 104         Passed - Valid encounter within last 6 months    Recent Outpatient Visits  1 month ago Hyperlipidemia associated with type 2 diabetes mellitus [facility])   [place] [facility] [place], Salvadore Oxford, NP   2 months ago Mixed hyperlipidemia   [place] [facility] [place], Salvadore Oxford, NP   9 months ago Prediabetes   [place] [facility] [place], Salvadore Oxford, NP   1 year ago Peripheral polyneuropathy   [place] [facility] [place], Salvadore Oxford, NP   1 year ago Mixed hyperlipidemia   [place] [facility] [place], Salvadore Oxford, NP       Future Appointments             In 1 month [place], Salvadore Oxford, NP  [place] [facility]

## 2023-03-13 NOTE — Telephone Encounter (Signed)
Requested Prescriptions  Pending Prescriptions Disp Refills   omeprazole (PRILOSEC) 20 MG capsule 90 capsule 3    Sig: Take 1 capsule (20 mg total) by mouth daily.     Gastroenterology: Proton Pump Inhibitors Passed - 03/12/2023  2:23 PM      Passed - Valid encounter within last 12 months    Recent Outpatient Visits           1 month ago Hyperlipidemia associated with type 2 diabetes mellitus [facility])   [place] [facility] [place], Salvadore Oxford, NP   2 months ago Mixed hyperlipidemia   [place] [facility] [place], Salvadore Oxford, NP   9 months ago Prediabetes   [facility] [place], Salvadore Oxford, NP   1 year ago Peripheral polyneuropathy   [place] [facility] [place], [place], NP   1 year ago Mixed hyperlipidemia   [place] [facility] [place], Salvadore Oxford, NP       Future Appointments             In 1 month [place], Salvadore Oxford, NP [place] [facility]             sildenafil (VIAGRA) 50 MG tablet 90 tablet 0    Sig: TAKE 1 TABLET EVERY DAY AS NEEDED FOR ERECTILE DYSFUNCTION     Urology: Erectile Dysfunction Agents Passed - 03/12/2023  2:23 PM      Passed - AST in normal range and within 360 days    AST  Date Value Ref Range Status  01/10/2023 12 10 - 35 U/L Final         Passed - ALT in normal range and within 360 days    ALT  Date Value Ref Range Status  01/10/2023 22 9 - 46 U/L Final         Passed - Last BP in normal range    BP Readings from Last 1 Encounters:  01/24/23 93/67         Passed - Valid encounter within last 12 months    Recent Outpatient Visits           1 month ago Hyperlipidemia associated with type 2 diabetes mellitus [facility])   [place] [facility] [place], Salvadore Oxford, NP   2 months ago Mixed hyperlipidemia   [place] [place], Salvadore Oxford, NP   9 months ago Prediabetes   [place] [facility] [place], Salvadore Oxford, NP   1 year ago Peripheral polyneuropathy   [place] [facility] [place], Salvadore Oxford, NP   1 year ago Mixed hyperlipidemia   [place] [facility] [place], Salvadore Oxford, NP       Future Appointments             In 1 month [place], Salvadore Oxford, NP [place] [facility]             digoxin (LANOXIN) 0.25 MG tablet 90 tablet 0    Sig: Take 1 tablet (250 mcg total) by mouth daily.     Cardiovascular:  Antiarrhythmic Agents - digoxin Failed - 03/12/2023  2:23 PM      Failed - Digoxin (serum) in normal range and within 360 days    No results found for: "DIGOXIN"       Failed - Mg Level in normal range  and within 360 days    Magnesium  Date Value Ref Range Status  12/14/2021 2.0 1.5 - 2.5 mg/dL Final         Failed - Patient had ECG in the last 360 days      Passed - Cr in normal range and within 180 days    Creat  Date Value Ref Range Status  01/10/2023 0.81 0.70 - 1.35 mg/dL Final         Passed - Ca in normal range and within 360 days    Calcium  Date Value Ref Range Status  01/10/2023 9.1 8.6 - 10.3 mg/dL Final         Passed - K in normal range and within 180 days    Potassium  Date Value Ref Range Status  01/10/2023 4.8 3.5 - 5.3 mmol/L Final         Passed - Last Heart Rate in normal range    Pulse Readings from Last 1 Encounters:  01/24/23 (!) 104         Passed - Valid encounter within last 6 months    Recent Outpatient Visits           1 month ago Hyperlipidemia associated with type 2 diabetes mellitus (HCC)   [place] [facility] [place], Salvadore Oxford, NP   2 months ago Mixed hyperlipidemia   [facility] [place], Salvadore Oxford, NP   9 months ago Prediabetes   [place] [facility] [place], Salvadore Oxford, NP   1 year ago Peripheral polyneuropathy   [place] [facility] [place], Salvadore Oxford, NP   1 year ago Mixed  hyperlipidemia   [place] [facility] [place], Salvadore Oxford, NP       Future Appointments             In 1 month [place], Salvadore Oxford, NP [place] [facility]             DULoxetine (CYMBALTA) 60 MG capsule 90 capsule 1    Sig: Take 1 capsule (60 mg total) by mouth daily.     Psychiatry: Antidepressants - SNRI - duloxetine Passed - 03/12/2023  2:23 PM      Passed - Cr in normal range and within 360 days    Creat  Date Value Ref Range Status  01/10/2023 0.81 0.70 - 1.35 mg/dL Final         Passed - eGFR is 30 or above and within 360 days    GFR, Est African American  Date Value Ref Range Status  12/18/2017 109 > OR = 60 mL/min/1.83m2 Final   GFR, Est Non African American  Date Value Ref Range Status  12/18/2017 94 > OR = 60 mL/min/1.4m2 Final   eGFR  Date Value Ref Range Status  01/10/2023 98 > OR = 60 mL/min/1.87m2 Final  11/14/2020 105 >59 mL/min/1.73 Final         Passed - Completed PHQ-2 or PHQ-9 in the last 360 days      Passed - Last BP in normal range    BP Readings from Last 1 Encounters:  01/24/23 93/67         Passed - Valid encounter within last 6 months    Recent Outpatient Visits           1 month ago Hyperlipidemia associated with type 2 diabetes mellitus [facility])    [place] [facility] [place], Salvadore Oxford, NP   2 months ago Mixed hyperlipidemia   [place] [facility] [place], Salvadore Oxford, NP   9 months ago Prediabetes   [place] [facility] [place], Salvadore Oxford, NP   1 year ago Peripheral polyneuropathy   [place] [facility] [place], Salvadore Oxford, NP   1 year ago Mixed hyperlipidemia   [place] [facility] [place], Salvadore Oxford, NP       Future Appointments             In 1 month [place], Salvadore Oxford, NP [place] [facility]

## 2023-03-22 ENCOUNTER — Telehealth (INDEPENDENT_AMBULATORY_CARE_PROVIDER_SITE_OTHER): Payer: Medicare HMO | Admitting: Internal Medicine

## 2023-03-22 ENCOUNTER — Encounter: Payer: Self-pay | Admitting: Internal Medicine

## 2023-03-22 ENCOUNTER — Ambulatory Visit: Payer: Self-pay

## 2023-03-22 ENCOUNTER — Telehealth: Payer: Self-pay

## 2023-03-22 DIAGNOSIS — R112 Nausea with vomiting, unspecified: Secondary | ICD-10-CM | POA: Diagnosis not present

## 2023-03-22 DIAGNOSIS — R110 Nausea: Secondary | ICD-10-CM

## 2023-03-22 DIAGNOSIS — R1110 Vomiting, unspecified: Secondary | ICD-10-CM

## 2023-03-22 DIAGNOSIS — K219 Gastro-esophageal reflux disease without esophagitis: Secondary | ICD-10-CM | POA: Diagnosis not present

## 2023-03-22 DIAGNOSIS — K5900 Constipation, unspecified: Secondary | ICD-10-CM

## 2023-03-22 MED ORDER — OMEPRAZOLE 40 MG PO CPDR: 40.0000 mg | DELAYED_RELEASE_CAPSULE | Freq: Every day | ORAL | 1 refills | Status: DC

## 2023-03-22 MED ORDER — PROMETHAZINE HCL 12.5 MG PO TABS: 12.5000 mg | ORAL_TABLET | Freq: Three times a day (TID) | ORAL | 0 refills | Status: DC | PRN

## 2023-03-22 NOTE — Telephone Encounter (Signed)
Spoke with patient about virtual appointment.  Advised per Rene Kocher he go to hospital for evaluation.  Patient unhappy and says he will think about going.

## 2023-03-22 NOTE — Patient Instructions (Signed)
Nausea, Adult Nausea is feeling like you may vomit. Feeling like you may vomit is usually not serious, but it may be an early sign of a more serious medical problem. Vomiting is when stomach contents forcefully come out of your mouth. If you vomit, or if you are not able to drink enough fluids, you may not have enough water in your body (get dehydrated). If you do not have enough water in your body, you may: Feel tired. Feel thirsty. Have a dry mouth. Have cracked lips. Pee (urinate) less often. Older adults and people who have other diseases or a weak body defense system (immune system) have a higher risk of not having enough water in the body. The main goals of treating this condition are: To relieve your nausea. To ensure your nausea occurs less often. To prevent vomiting and losing too much fluid. Follow these instructions at home: Watch your symptoms for any changes. Tell your doctor about them. Eating and drinking     Take an ORS (oral rehydration solution). This is a drink that is sold at pharmacies and stores. Drink clear fluids in small amounts as you are able. These include: Water. Ice chips. Fruit juice that has water added (diluted fruit juice). Low-calorie sports drinks. Eat bland, easy-to-digest foods in small amounts as you are able, such as: Bananas. Applesauce. Rice. Low-fat (lean) meats. Toast. Crackers. Avoid drinking fluids that have a lot of sugar or caffeine in them. This includes energy drinks, sports drinks, and soda. Avoid alcohol. Avoid spicy or fatty foods. General instructions Take over-the-counter and prescription medicines only as told by your doctor. Rest at home while you get better. Drink enough fluid to keep your pee (urine) pale yellow. Take slow and deep breaths when you feel like you may vomit. Avoid food or things that have strong smells. Wash your hands often with soap and water for at least 20 seconds. If you cannot use soap and water,  use hand sanitizer. Make sure that everyone in your home washes their hands well and often. Keep all follow-up visits. Contact a doctor if: You feel worse. You feel like you may vomit and this lasts for more than 2 days. You vomit. You are not able to drink fluids without vomiting. You have new symptoms. You have a fever. You have a headache. You have muscle cramps. You have a rash. You have pain while peeing. You feel light-headed or dizzy. Get help right away if: You have pain in your chest, neck, arm, or jaw. You feel very weak or you faint. You have vomit that is bright red or looks like coffee grounds. You have bloody or black poop (stools) or poop that looks like tar. You have a very bad headache, a stiff neck, or both. You have very bad pain, cramping, or bloating in your belly (abdomen). You have trouble breathing or you are breathing very quickly. Your heart is beating very quickly. Your skin feels cold and clammy. You feel confused. You have signs of losing too much water in your body, such as: Dark pee, very little pee, or no pee. Cracked lips. Dry mouth. Sunken eyes. Sleepiness. Weakness. These symptoms may be an emergency. Get help right away. Call 911. Do not wait to see if the symptoms will go away. Do not drive yourself to the hospital. Summary Nausea is feeling like you are about vomit. If you vomit, or if you are not able to drink enough fluids, you may not have enough water in   your body (get dehydrated). Eat and drink what your doctor tells you. Take over-the-counter and prescription medicines only as told by your doctor. Contact a doctor right away if your symptoms get worse or you have new symptoms. Keep all follow-up visits. This information is not intended to replace advice given to you by your health care provider. Make sure you discuss any questions you have with your health care provider. Document Revised: 02/24/2021 Document Reviewed:  02/24/2021 Elsevier Patient Education  2024 Elsevier Inc.  

## 2023-03-22 NOTE — Progress Notes (Signed)
Virtual Visit via Video Note  I connected with Ricardo NEUBERT Sr. on 03/22/23 at  4:00 PM EDT by a video enabled telemedicine application and verified that I am speaking with the correct person using two identifiers.  Location: Patient: Home Provider: Work   I discussed the limitations of evaluation and management by telemedicine and the availability of in person appointments. The patient expressed understanding and agreed to proceed.  History of Present Illness:  Pt reports recurrent episodes of nausea and vomiting. He reports this started 3 months ago after getting back for [place]. He reports this occurs about every 2 weeks and last for 2-4 days. He reports he typically doesn't vomit food but does vomit bile. He reports during these episodes he gets so weak that he can hardly get out of bed. He denies associated chest pain, shortness of breath, diarrhea, or blood in his stool. He has been constipated the last few days but this is not typical. He denies urinary issues. He does not think he has lost a significant amount of weight. He denies recent change in diet or medications in the last 3 months. He reports he did run out of some of his medications while is [place], but restarted those as soon as he came back. He takes zofran but reports this doesn't help at all.   Past Medical History:  Diagnosis Date   Arthritis    Rheumatoid   Bipolar disorder (HCC)    Complication of anesthesia    difficulty keeping patient asleep. fentanyl makes patient mean, grumpy and difficult   Depression    Fusion of spine    GERD (gastroesophageal reflux disease)    slightly uncomfortable. takes otc antacids occasionally   Headache    deteriorating discs causing headaches. uses advil   Hepatitis C    HEP "C". treated 2 years ago   History of kidney stones 2016   Hyperlipidemia    Pericarditis    20 years ago   Peripheral vascular disease (HCC)    Smoker     Current Outpatient Medications  Medication  Sig Dispense Refill   omeprazole (PRILOSEC) 40 MG capsule Take 1 capsule (40 mg total) by mouth daily. 90 capsule 1   promethazine (PHENERGAN) 12.5 MG tablet Take 1 tablet (12.5 mg total) by mouth every 8 (eight) hours as needed for nausea or vomiting. 20 tablet 0   apixaban (ELIQUIS) 5 MG TABS tablet TAKE 1 TABLET TWICE DAILY 180 tablet 1   atorvastatin (LIPITOR) 80 MG tablet Take 1 tablet (80 mg total) by mouth daily. 90 tablet 1   digoxin (LANOXIN) 0.25 MG tablet Take 1 tablet (250 mcg total) by mouth daily. 90 tablet 0   DULoxetine (CYMBALTA) 60 MG capsule TAKE 1 CAPSULE EVERY DAY 90 capsule 1   empagliflozin (JARDIANCE) 10 MG TABS tablet TAKE 1 TABLET EVERY DAY BEFORE BREAKFAST 90 tablet 1   Fluticasone-Umeclidin-Vilant (TRELEGY ELLIPTA) 100-62.5-25 MCG/ACT AEPB INHALE 1 PUFF INTO THE LUNGS DAILY 180 each 1   gabapentin (NEURONTIN) 600 MG tablet TAKE 1 TABLET THREE TIMES DAILY 270 tablet 1   metoprolol succinate (TOPROL-XL) 25 MG 24 hr tablet Take 1 tablet (25 mg total) by mouth daily. 90 tablet 1   sildenafil (VIAGRA) 50 MG tablet TAKE 1 TABLET EVERY DAY AS NEEDED FOR ERECTILE DYSFUNCTION 90 tablet 0   No current facility-administered medications for this visit.    Allergies  Allergen Reactions   Chantix [Varenicline] Other (See Comments)    Depression  Family History  Problem Relation Age of Onset   Cancer Mother        liver   Heart disease Father    Diabetes Father    Breast cancer Sister    Brain cancer Sister     Social History   Socioeconomic History   Marital status: Single    Spouse name: Not on file   Number of children: Not on file   Years of education: Not on file   Highest education level: Not on file  Occupational History   Not on file  Tobacco Use   Smoking status: Every Day    Current packs/day: 0.50    Average packs/day: 0.5 packs/day for 40.0 years (20.0 ttl pk-yrs)    Types: Cigarettes   Smokeless tobacco: Never  Vaping Use   Vaping status:  Former  Substance and Sexual Activity   Alcohol use: Not Currently    Alcohol/week: 2.0 standard drinks of alcohol    Types: 2 Shots of liquor per week    Comment: occasional (every 3-4 weeks)    Drug use: No    Comment: polysubstance approx 2 years ago   Sexual activity: Not Currently  Other Topics Concern   Not on file  Social History Narrative   Not on file   Social Determinants of Health   Financial Resource Strain: Low Risk  (02/28/2023)   Overall Financial Resource Strain (CARDIA)    Difficulty of Paying Living Expenses: Not hard at all  Food Insecurity: No Food Insecurity (02/28/2023)   Hunger Vital Sign    Worried About Running Out of Food in the Last Year: Never true    Ran Out of Food in the Last Year: Never true  Transportation Needs: No Transportation Needs (02/28/2023)   PRAPARE - Administrator, Civil Service (Medical): No    Lack of Transportation (Non-Medical): No  Physical Activity: Inactive (02/28/2023)   Exercise Vital Sign    Days of Exercise per Week: 0 days    Minutes of Exercise per Session: 0 min  Stress: No Stress Concern Present (02/28/2023)   [employer] of Occupational Health - Occupational Stress Questionnaire    Feeling of Stress : Not at all  Social Connections: Moderately Isolated (02/28/2023)   Social Connection and Isolation Panel [NHANES]    Frequency of Communication with Friends and Family: More than three times a week    Frequency of Social Gatherings with Friends and Family: Once a week    Attends Religious Services: Never    Database administrator or Organizations: No    Attends Banker Meetings: Never    Marital Status: Married  Catering manager Violence: Not At Risk (02/28/2023)   Humiliation, Afraid, Rape, and Kick questionnaire    Fear of Current or Ex-Partner: No    Emotionally Abused: No    Physically Abused: No    Sexually Abused: No     Constitutional: Denies fever, malaise, fatigue, headache or  abrupt weight changes.  HEENT: Denies eye pain, eye redness, ear pain, ringing in the ears, wax buildup, runny nose, nasal congestion, bloody nose, or sore throat. Respiratory: Denies difficulty breathing, shortness of breath, cough or sputum production.   Cardiovascular: Denies chest pain, chest tightness, palpitations or swelling in the hands or feet.  Gastrointestinal: Pt reports nausea, vomiting, constipation. Denies abdominal pain, bloating,  diarrhea or blood in the stool.  GU: Denies urgency, frequency, pain with urination, burning sensation, blood in urine, odor or discharge.  Musculoskeletal: Pt reports weakness. Denies decrease in range of motion, difficulty with gait, muscle pain or joint pain and swelling.  Skin: Denies redness, rashes, lesions or ulcercations.   No other specific complaints in a complete review of systems (except as listed in HPI above).    Observations/Objective:  Wt Readings from Last 3 Encounters:  02/28/23 150 lb (68 kg)  01/24/23 151 lb (68.5 kg)  01/10/23 160 lb (72.6 kg)    General: Appears his stated age, chronically ill appearing in NAD. Pulmonary/Chest: Normal effort. No respiratory distress.  Neurological: Alert and oriented.    BMET    Component Value Date/Time   NA 137 01/10/2023 1601   NA 137 11/14/2020 1409   K 4.8 01/10/2023 1601   CL 103 01/10/2023 1601   CO2 23 01/10/2023 1601   GLUCOSE 139 01/10/2023 1601   BUN 22 01/10/2023 1601   BUN 15 11/14/2020 1409   CREATININE 0.81 01/10/2023 1601   CALCIUM 9.1 01/10/2023 1601   GFRNONAA 94 12/18/2017 1137   GFRAA 109 12/18/2017 1137    Lipid Panel     Component Value Date/Time   CHOL 295 (H) 01/10/2023 1601   CHOL 217 (H) 12/23/2015 1606   TRIG 284 (H) 01/10/2023 1601   HDL 44 01/10/2023 1601   HDL 29 (L) 12/23/2015 1606   CHOLHDL 6.7 (H) 01/10/2023 1601   LDLCALC 200 (H) 01/10/2023 1601    CBC    Component Value Date/Time   WBC 14.7 (H) 01/10/2023 1601   RBC 4.19 (L)  01/10/2023 1601   HGB 14.5 01/10/2023 1601   HGB 12.9 (L) 11/14/2020 1409   HCT 42.6 01/10/2023 1601   HCT 40.0 11/14/2020 1409   PLT 277 01/10/2023 1601   PLT 287 11/14/2020 1409   MCV 101.7 (H) 01/10/2023 1601   MCV 95 11/14/2020 1409   MCH 34.6 (H) 01/10/2023 1601   MCHC 34.0 01/10/2023 1601   RDW 18.5 (H) 01/10/2023 1601   RDW 14.4 11/14/2020 1409   LYMPHSABS 0.9 11/14/2020 1409   MONOABS 0.4 06/13/2017 1502   EOSABS 0.2 11/14/2020 1409   BASOSABS 0.0 11/14/2020 1409    Hgb A1C Lab Results  Component Value Date   HGBA1C 7.9 (H) 01/10/2023        Assessment and Plan:  Persistent nausea, vomiting, constipation, GERD:  Will increase omeprazole to 40 mg daily D/c zofran RX for promethazine 12.5 mg Q8H prn Discussed obtaining CT abd  but he would like to hold off at this time Referral to GI for further evaluation of symptoms  Follow Up Instructions:    I discussed the assessment and treatment plan with the patient. The patient was provided an opportunity to ask questions and all were answered. The patient agreed with the plan and demonstrated an understanding of the instructions.   The patient was advised to call back or seek an in-person evaluation if the symptoms worsen or if the condition fails to improve as anticipated.    Nicki Reaper, NP

## 2023-03-22 NOTE — Telephone Encounter (Signed)
Message from [place] M sent at 03/22/2023 11:04 AM EDT  Summary: experiencing severe nausea   Pt stated he is experiencing severe nausea. PCP prescribed the medication ondansetron (ZOFRAN-ODT) 4 MG disintegrating tablet, which is not working. Stated nausea has him bedridden.  No appointments available. Seeking clinical advice.         Chief Complaint: nausea (Zofran not helping) Symptoms: nausea poor food intake Frequency: chronic (several months) Pertinent Negatives: Patient denies dizziness and vomiting.  Disposition: [] ED /[] Urgent Care (no appt availability in office) / [x] Appointment(In office/virtual)/ []  [place] [facility]/ [] Home Care/ [] Refused Recommended Disposition /[] [place] [facility]/ []  Follow-up with PCP Additional Notes: pt mentioned would like referral to GI. Also mentioned 2 medications need to be reviewed (duloxetine and omeprazole) pt was unsure. Pt able to walk and get OOB without difficulty   Reason for Disposition  Nausea is a chronic symptom (recurrent or ongoing AND present > 4 weeks)  Answer Assessment - Initial Assessment Questions 1. NAUSEA SEVERITY: "How bad is the nausea?" (e.g., mild, moderate, severe; dehydration, weight loss)   - MILD: loss of appetite without change in eating habits   - MODERATE: decreased oral intake without significant weight loss, dehydration, or malnutrition   - SEVERE: inadequate caloric or fluid intake, significant weight loss, symptoms of dehydration     Moderate  2. ONSET: "When did the nausea begin?"     Several months 3. VOMITING: "Any vomiting?" If Yes, ask: "How many times today?"     No  4. RECURRENT SYMPTOM: "Have you had nausea before?" If Yes, ask: "When was the last time?" "What happened that time?"     yes 5. CAUSE: "What do you think is causing the nausea?"     Unsure  Protocols used: Nausea-A-AH

## 2023-04-12 ENCOUNTER — Telehealth (INDEPENDENT_AMBULATORY_CARE_PROVIDER_SITE_OTHER): Payer: Medicare HMO | Admitting: Internal Medicine

## 2023-04-12 ENCOUNTER — Other Ambulatory Visit: Payer: Self-pay | Admitting: Internal Medicine

## 2023-04-12 DIAGNOSIS — E1151 Type 2 diabetes mellitus with diabetic peripheral angiopathy without gangrene: Secondary | ICD-10-CM | POA: Diagnosis not present

## 2023-04-12 DIAGNOSIS — K219 Gastro-esophageal reflux disease without esophagitis: Secondary | ICD-10-CM

## 2023-04-12 DIAGNOSIS — J449 Chronic obstructive pulmonary disease, unspecified: Secondary | ICD-10-CM

## 2023-04-12 DIAGNOSIS — I482 Chronic atrial fibrillation, unspecified: Secondary | ICD-10-CM | POA: Diagnosis not present

## 2023-04-12 DIAGNOSIS — F32A Depression, unspecified: Secondary | ICD-10-CM | POA: Insufficient documentation

## 2023-04-12 DIAGNOSIS — F3341 Major depressive disorder, recurrent, in partial remission: Secondary | ICD-10-CM

## 2023-04-12 DIAGNOSIS — E1169 Type 2 diabetes mellitus with other specified complication: Secondary | ICD-10-CM | POA: Diagnosis not present

## 2023-04-12 DIAGNOSIS — I5020 Unspecified systolic (congestive) heart failure: Secondary | ICD-10-CM

## 2023-04-12 DIAGNOSIS — Z7984 Long term (current) use of oral hypoglycemic drugs: Secondary | ICD-10-CM

## 2023-04-12 DIAGNOSIS — I70213 Atherosclerosis of native arteries of extremities with intermittent claudication, bilateral legs: Secondary | ICD-10-CM | POA: Diagnosis not present

## 2023-04-12 DIAGNOSIS — R350 Frequency of micturition: Secondary | ICD-10-CM

## 2023-04-12 DIAGNOSIS — N401 Enlarged prostate with lower urinary tract symptoms: Secondary | ICD-10-CM | POA: Diagnosis not present

## 2023-04-12 DIAGNOSIS — G629 Polyneuropathy, unspecified: Secondary | ICD-10-CM | POA: Diagnosis not present

## 2023-04-12 DIAGNOSIS — I6930 Unspecified sequelae of cerebral infarction: Secondary | ICD-10-CM

## 2023-04-12 DIAGNOSIS — N529 Male erectile dysfunction, unspecified: Secondary | ICD-10-CM

## 2023-04-12 DIAGNOSIS — E785 Hyperlipidemia, unspecified: Secondary | ICD-10-CM

## 2023-04-12 MED ORDER — GABAPENTIN 300 MG PO CAPS: 300.0000 mg | ORAL_CAPSULE | Freq: Every day | ORAL | 1 refills | Status: DC

## 2023-04-12 MED ORDER — ALBUTEROL SULFATE HFA 108 (90 BASE) MCG/ACT IN AERS: 2.0000 | INHALATION_SPRAY | Freq: Four times a day (QID) | RESPIRATORY_TRACT | 0 refills | Status: DC | PRN

## 2023-04-12 NOTE — Assessment & Plan Note (Signed)
Managed on digoxin, metoprolol and Eliquis He will continue to follow with cardiology

## 2023-04-12 NOTE — Assessment & Plan Note (Signed)
Continue duloxetine Support offered

## 2023-04-12 NOTE — Patient Instructions (Signed)
Smoking Cessation You will learn methods to help you quit smoking and how quitting can help prevent a variety of health problems and improve your health. To view the content, go to this web address: https://pe.elsevier.com/[email or web address]  This video will expire on: 02/24/2025. If you need access to this video following this date, please reach out to the healthcare provider who assigned it to you. This information is not intended to replace advice given to you by your health care provider. Make sure you discuss any questions you have with your health care provider. Elsevier Patient Education  2024 [employer].

## 2023-04-12 NOTE — Assessment & Plan Note (Signed)
Not medicated We will monitor 

## 2023-04-12 NOTE — Assessment & Plan Note (Signed)
Continue omeprazole and promethazine Follow-up with [facility] pending

## 2023-04-12 NOTE — Assessment & Plan Note (Signed)
A1c and urine microalbumin today Encouraged him to consume a low-carb diet and exercise for weight loss Continue Jardiance Encouraged routine eye exam Encouraged routine foot exam Encouraged him to get a flu shot in the fall Will discuss pneumonia vaccination at his next visit Encourage COVID booster

## 2023-04-12 NOTE — Progress Notes (Signed)
Virtual Visit via Video Note  I connected with Ricardo FERRUFINO Sr. on 04/12/23 at  2:20 PM EDT by a video enabled telemedicine application and verified that I am speaking with the correct person using two identifiers.  Location: Patient: Home Provider: Home  Persons participating in this video call: Nicki Reaper, NP and Ricardo Cervantes, Sr.   I discussed the limitations of evaluation and management by telemedicine and the availability of in person appointments. The patient expressed understanding and agreed to proceed.  History of Present Illness:  Patient due for 63-month follow-up of chronic conditions.  A-fib: Chronic, managed on digoxin, metoprolol and eliquis.  ECG from 06/2017 reviewed.  He follows with cardiology.  CHF: He has some shortness of breath but denies lower extremity edema.  He is taking metoprolol as prescribed.  He is not currently taking any diuretics.  Echo from 10/2021 reviewed.  He follows with cardiology.  COPD: He reports chronic cough and shortness of breath.  He is taking trelegy as prescribed. He would like a RX for albuterol inhaler. There are no PFTs on file.  He follows with pulmonology.  GERD: He reports intermittent nausea.  He is not sure what triggers this.  He has frequent breakthrough on omeprazole despite this recently being increased. He is taking promethazine as prescribed. There is no upper GI on file. He has been referred to [facility].  DM2 with peripheral neuropathy: New onset.  His last A1c was 7.9%, 01/2023.  He is taking jardiance and gabapentin as prescribed.  He does not check his sugars.  He checks his feet routinely.  His last eye exam was > 1 year ago.  Flu 09/2020.  Pneumovax never.  COVID x 2.  ED/BPH: He reports mainly dribbling.  He is taking sildenafil as needed.  He does not follow with urology.  HLD status post stroke with PAD, aortic atheroscleorosis: His last LDL was 200, triglycerides 284, 01/2023.  He denies myalgias on atorvastatin.   He is taking metoprolol and eliquis as well.  He tries to consume a low-fat diet.  He follows with vascular.   Depression: Managed on duloxetine. He is not currently seeing a therapist. He denies anxiety, SI/HI.  Past Medical History:  Diagnosis Date   Arthritis    Rheumatoid   Bipolar disorder (HCC)    Complication of anesthesia    difficulty keeping patient asleep. fentanyl makes patient mean, grumpy and difficult   Depression    Fusion of spine    GERD (gastroesophageal reflux disease)    slightly uncomfortable. takes otc antacids occasionally   Headache    deteriorating discs causing headaches. uses advil   Hepatitis C    HEP "C". treated 2 years ago   History of kidney stones 2016   Hyperlipidemia    Pericarditis    20 years ago   Peripheral vascular disease (HCC)    Smoker     Current Outpatient Medications  Medication Sig Dispense Refill   apixaban (ELIQUIS) 5 MG TABS tablet TAKE 1 TABLET TWICE DAILY 180 tablet 1   atorvastatin (LIPITOR) 80 MG tablet Take 1 tablet (80 mg total) by mouth daily. 90 tablet 1   digoxin (LANOXIN) 0.25 MG tablet Take 1 tablet (250 mcg total) by mouth daily. 90 tablet 0   DULoxetine (CYMBALTA) 60 MG capsule TAKE 1 CAPSULE EVERY DAY 90 capsule 1   empagliflozin (JARDIANCE) 10 MG TABS tablet TAKE 1 TABLET EVERY DAY BEFORE BREAKFAST 90 tablet 1   Fluticasone-Umeclidin-Vilant (TRELEGY  ELLIPTA) 100-62.5-25 MCG/ACT AEPB INHALE 1 PUFF INTO THE LUNGS DAILY 180 each 1   gabapentin (NEURONTIN) 600 MG tablet TAKE 1 TABLET THREE TIMES DAILY 270 tablet 1   metoprolol succinate (TOPROL-XL) 25 MG 24 hr tablet Take 1 tablet (25 mg total) by mouth daily. 90 tablet 1   omeprazole (PRILOSEC) 40 MG capsule Take 1 capsule (40 mg total) by mouth daily. 90 capsule 1   promethazine (PHENERGAN) 12.5 MG tablet Take 1 tablet (12.5 mg total) by mouth every 8 (eight) hours as needed for nausea or vomiting. 20 tablet 0   sildenafil (VIAGRA) 50 MG tablet TAKE 1 TABLET EVERY  DAY AS NEEDED FOR ERECTILE DYSFUNCTION 90 tablet 0   No current facility-administered medications for this visit.    Allergies  Allergen Reactions   Chantix [Varenicline] Other (See Comments)    Depression    Family History  Problem Relation Age of Onset   Cancer Mother        liver   Heart disease Father    Diabetes Father    Breast cancer Sister    Brain cancer Sister     Social History   Socioeconomic History   Marital status: Single    Spouse name: Not on file   Number of children: Not on file   Years of education: Not on file   Highest education level: Not on file  Occupational History   Not on file  Tobacco Use   Smoking status: Every Day    Current packs/day: 0.50    Average packs/day: 0.5 packs/day for 40.0 years (20.0 ttl pk-yrs)    Types: Cigarettes   Smokeless tobacco: Never  Vaping Use   Vaping status: Former  Substance and Sexual Activity   Alcohol use: Not Currently    Alcohol/week: 2.0 standard drinks of alcohol    Types: 2 Shots of liquor per week    Comment: occasional (every 3-4 weeks)    Drug use: No    Comment: polysubstance approx 2 years ago   Sexual activity: Not Currently  Other Topics Concern   Not on file  Social History Narrative   Not on file   Social Determinants of Health   Financial Resource Strain: Low Risk  (02/28/2023)   Overall Financial Resource Strain (CARDIA)    Difficulty of Paying Living Expenses: Not hard at all  Food Insecurity: No Food Insecurity (02/28/2023)   Hunger Vital Sign    Worried About Running Out of Food in the Last Year: Never true    Ran Out of Food in the Last Year: Never true  Transportation Needs: No Transportation Needs (02/28/2023)   PRAPARE - Administrator, Civil Service (Medical): No    Lack of Transportation (Non-Medical): No  Physical Activity: Inactive (02/28/2023)   Exercise Vital Sign    Days of Exercise per Week: 0 days    Minutes of Exercise per Session: 0 min  Stress: No  Stress Concern Present (02/28/2023)   [employer] of Occupational Health - Occupational Stress Questionnaire    Feeling of Stress : Not at all  Social Connections: Moderately Isolated (02/28/2023)   Social Connection and Isolation Panel [NHANES]    Frequency of Communication with Friends and Family: More than three times a week    Frequency of Social Gatherings with Friends and Family: Once a week    Attends Religious Services: Never    Database administrator or Organizations: No    Attends Banker Meetings:  Never    Marital Status: Married  Catering manager Violence: Not At Risk (02/28/2023)   Humiliation, Afraid, Rape, and Kick questionnaire    Fear of Current or Ex-Partner: No    Emotionally Abused: No    Physically Abused: No    Sexually Abused: No     Constitutional: Denies fever, malaise, fatigue, headache or abrupt weight changes.  HEENT: Denies eye pain, eye redness, ear pain, ringing in the ears, wax buildup, runny nose, nasal congestion, bloody nose, or sore throat. Respiratory: Patient reports chronic cough and shortness of breath.  Denies difficulty breathing, or sputum production.   Cardiovascular: Denies chest pain, chest tightness, palpitations or swelling in the hands or feet.  Gastrointestinal: Patient reports frequent nausea, constipation.  Denies abdominal pain, bloating, constipation, diarrhea or blood in the stool.  GU: Patient reports erectile dysfunction.  Denies urgency, frequency, pain with urination, burning sensation, blood in urine, odor or discharge. Musculoskeletal: Denies decrease in range of motion, difficulty with gait, muscle pain or joint pain and swelling.  Skin: Denies redness, rashes, lesions or ulcercations.  Neurological: Patient reports neuropathic pain.  Denies dizziness, difficulty with memory, difficulty with speech or problems with balance and coordination.  Psych: Pt has history of depression. Denies anxiety, SI/HI.  No  other specific complaints in a complete review of systems (except as listed in HPI above).  Observations/Objective:   Wt Readings from Last 3 Encounters:  02/28/23 150 lb (68 kg)  01/24/23 151 lb (68.5 kg)  01/10/23 160 lb (72.6 kg)    General: Appears his stated age, chronically ill-appearing, in NAD. Skin: No ulcerations reported. Cardiovascular: No swelling noted. Pulmonary/Chest: Normal effort. No respiratory distress.  Neurological: Alert and oriented.  Coordination normal.  Psychiatric: Mood and affect normal. Behavior is normal. Judgment and thought content normal.     BMET    Component Value Date/Time   NA 137 01/10/2023 1601   NA 137 11/14/2020 1409   K 4.8 01/10/2023 1601   CL 103 01/10/2023 1601   CO2 23 01/10/2023 1601   GLUCOSE 139 01/10/2023 1601   BUN 22 01/10/2023 1601   BUN 15 11/14/2020 1409   CREATININE 0.81 01/10/2023 1601   CALCIUM 9.1 01/10/2023 1601   GFRNONAA 94 12/18/2017 1137   GFRAA 109 12/18/2017 1137    Lipid Panel     Component Value Date/Time   CHOL 295 (H) 01/10/2023 1601   CHOL 217 (H) 12/23/2015 1606   TRIG 284 (H) 01/10/2023 1601   HDL 44 01/10/2023 1601   HDL 29 (L) 12/23/2015 1606   CHOLHDL 6.7 (H) 01/10/2023 1601   LDLCALC 200 (H) 01/10/2023 1601    CBC    Component Value Date/Time   WBC 14.7 (H) 01/10/2023 1601   RBC 4.19 (L) 01/10/2023 1601   HGB 14.5 01/10/2023 1601   HGB 12.9 (L) 11/14/2020 1409   HCT 42.6 01/10/2023 1601   HCT 40.0 11/14/2020 1409   PLT 277 01/10/2023 1601   PLT 287 11/14/2020 1409   MCV 101.7 (H) 01/10/2023 1601   MCV 95 11/14/2020 1409   MCH 34.6 (H) 01/10/2023 1601   MCHC 34.0 01/10/2023 1601   RDW 18.5 (H) 01/10/2023 1601   RDW 14.4 11/14/2020 1409   LYMPHSABS 0.9 11/14/2020 1409   MONOABS 0.4 06/13/2017 1502   EOSABS 0.2 11/14/2020 1409   BASOSABS 0.0 11/14/2020 1409    Hgb A1C Lab Results  Component Value Date   HGBA1C 7.9 (H) 01/10/2023  Assessment and  Plan:  RTC in 3 months for your annual exam  Follow Up Instructions:    I discussed the assessment and treatment plan with the patient. The patient was provided an opportunity to ask questions and all were answered. The patient agreed with the plan and demonstrated an understanding of the instructions.   The patient was advised to call back or seek an in-person evaluation if the symptoms worsen or if the condition fails to improve as anticipated.    Nicki Reaper, NP

## 2023-04-12 NOTE — Assessment & Plan Note (Signed)
Encourage smoking cessation Continue Trelegy Rx for albuterol sent to pharmacy

## 2023-04-12 NOTE — Assessment & Plan Note (Signed)
Continue gabapentin 600 mg 2 times daily Will increase gabapentin to 900 mg at bedtime

## 2023-04-12 NOTE — Assessment & Plan Note (Signed)
Encourage smoking cessation Continue atorvastatin, metoprolol and Eliquis Lipid profile ordered

## 2023-04-12 NOTE — Assessment & Plan Note (Signed)
Compensated Continue metoprolol Not currently on diuretics Reinforced DASH diet Monitor daily weights He will continue to follow with cardiology

## 2023-04-12 NOTE — Assessment & Plan Note (Signed)
Continue sildenafil as needed. 

## 2023-04-12 NOTE — Assessment & Plan Note (Signed)
C-Met and lipid profile ordered Encouraged him to consume a low-fat diet Continue atorvastatin

## 2023-04-12 NOTE — Assessment & Plan Note (Signed)
Continue atorvastatin, metoprolol and Eliquis C-Met and lipid profile ordered

## 2023-04-15 NOTE — Telephone Encounter (Signed)
Requested medication (s) are due for refill today: Yes  Requested medication (s) are on the active medication list: Yes  Last refill:  03/22/23  Future visit scheduled: No  Notes to clinic:  Not delegated.    Requested Prescriptions  Pending Prescriptions Disp Refills   promethazine (PHENERGAN) 12.5 MG tablet [Pharmacy Med Name: PROMETHAZINE HCL 12.5 MG TAB] 20 tablet 0    Sig: TAKE 1 TABLET BY MOUTH EVERY 8 HOURS AS NEEDED FOR NAUSEA OR VOMITING     Not Delegated - Gastroenterology: Antiemetics Failed - 04/12/2023  2:29 PM      Failed - This refill cannot be delegated      Passed - Valid encounter within last 6 months    Recent Outpatient Visits           3 days ago Type 2 diabetes mellitus with diabetic peripheral angiopathy without gangrene, without long-term current use of insulin [facility])   [place] [facility], Salvadore [place], NP   3 weeks ago Chronic nausea   [place] [facility] [place], [place], NP   2 months ago Hyperlipidemia associated with type 2 diabetes mellitus [facility])   [place] [facility] [place], Salv[place]ore [place], NP   3 months ago Mixed hyperlipidemia   [place] [facility] [place], Salvadore Oxford, NP   10 months ago Prediabetes   [place] [facility] [place], Salvadore [place], [place]

## 2023-04-22 ENCOUNTER — Other Ambulatory Visit: Payer: Medicare HMO

## 2023-04-22 DIAGNOSIS — Z7901 Long term (current) use of anticoagulants: Secondary | ICD-10-CM | POA: Diagnosis not present

## 2023-04-22 DIAGNOSIS — E1151 Type 2 diabetes mellitus with diabetic peripheral angiopathy without gangrene: Secondary | ICD-10-CM

## 2023-04-23 LAB — COMPLETE METABOLIC PANEL WITH GFR
AG Ratio: 1.7 (calc) (ref 1.0–2.5)
ALT: 14 U/L (ref 9–46)
AST: 13 U/L (ref 10–35)
Albumin: 4.5 g/dL (ref 3.6–5.1)
Alkaline phosphatase (APISO): 113 U/L (ref 35–144)
BUN/Creatinine Ratio: 25 (calc) — ABNORMAL HIGH (ref 6–22)
BUN: 16 mg/dL (ref 7–25)
CO2: 27 mmol/L (ref 20–32)
Calcium: 9.7 mg/dL (ref 8.6–10.3)
Chloride: 102 mmol/L (ref 98–110)
Creat: 0.64 mg/dL — ABNORMAL LOW (ref 0.70–1.35)
Globulin: 2.6 g/dL (ref 1.9–3.7)
Glucose, Bld: 116 mg/dL (ref 65–139)
Potassium: 3.7 mmol/L (ref 3.5–5.3)
Sodium: 140 mmol/L (ref 135–146)
Total Bilirubin: 0.5 mg/dL (ref 0.2–1.2)
Total Protein: 7.1 g/dL (ref 6.1–8.1)
eGFR: 105 mL/min/{1.73_m2} (ref >=60)

## 2023-04-23 LAB — CBC
HCT: 47.3 % (ref 38.5–50.0)
Hemoglobin: 15.4 g/dL (ref 13.2–17.1)
MCH: 30.6 pg (ref 27.0–33.0)
MCHC: 32.6 g/dL (ref 32.0–36.0)
MCV: 94 fL (ref 80.0–100.0)
MPV: 11.5 fL (ref 7.5–12.5)
Platelets: 214 10*3/uL (ref 140–400)
RBC: 5.03 10*6/uL (ref 4.20–5.80)
RDW: 13.4 % (ref 11.0–15.0)
WBC: 6.8 10*3/uL (ref 3.8–10.8)

## 2023-04-23 LAB — LIPID PANEL
Cholesterol: 148 mg/dL (ref <200)
HDL: 41 mg/dL (ref >=40)
LDL Cholesterol (Calc): 84 mg/dL
Non-HDL Cholesterol (Calc): 107 mg/dL (ref <130)
Total CHOL/HDL Ratio: 3.6 (calc) (ref <5.0)
Triglycerides: 131 mg/dL (ref <150)

## 2023-04-23 LAB — MICROALBUMIN / CREATININE URINE RATIO
Creatinine, Urine: 86 mg/dL (ref 20–320)
Microalb Creat Ratio: 15 mg/g{creat} (ref <30)
Microalb, Ur: 1.3 mg/dL

## 2023-04-23 LAB — HEMOGLOBIN A1C
Hgb A1c MFr Bld: 5.9 %{Hb} — ABNORMAL HIGH (ref <5.7)
Mean Plasma Glucose: 123 mg/dL
eAG (mmol/L): 6.8 mmol/L

## 2023-04-29 ENCOUNTER — Other Ambulatory Visit: Payer: Self-pay | Admitting: Internal Medicine

## 2023-04-30 NOTE — Telephone Encounter (Signed)
Requested medication (s) are due for refill today: yes  Requested medication (s) are on the active medication list: yes    Last refill: 04/15/23  #20  0 refills   Future visit scheduled no  Notes to clinic:Not delegated, please review. Thank you.  Requested Prescriptions  Pending Prescriptions Disp Refills   promethazine (PHENERGAN) 12.5 MG tablet [Pharmacy Med Name: PROMETHAZINE HCL 12.5 MG TAB] 20 tablet 0    Sig: TAKE 1 TABLET BY MOUTH EVERY 8 HOURS AS NEEDED FOR NAUSEA OR VOMITING     Not Delegated - Gastroenterology: Antiemetics Failed - 04/29/2023  5:30 PM      Failed - This refill cannot be delegated      Passed - Valid encounter within last 6 months    Recent Outpatient Visits           2 weeks ago Type 2 diabetes mellitus with diabetic peripheral angiopathy without gangrene, without long-term current use of insulin [facility])   [place] [facility] [place], Salvadore [place], NP   1 month ago Chronic nausea   [place] [facility] [place], [place], NP   3 months ago Hyperlipidemia associated with type 2 diabetes mellitus [facility])   [place] [facility] [place], Salvadore [place], NP   3 months ago Mixed hyperlipidemia   [place] [facility] [place], Salvadore Oxford, NP   11 months ago Prediabetes   [place] [facility] [place], Salvadore [place], [place]

## 2023-05-09 ENCOUNTER — Other Ambulatory Visit: Payer: Self-pay | Admitting: Internal Medicine

## 2023-05-10 NOTE — Telephone Encounter (Signed)
Requested Prescriptions  Pending Prescriptions Disp Refills   albuterol (VENTOLIN HFA) 108 (90 Base) MCG/ACT inhaler [Pharmacy Med Name: Albuterol Sulfate HFA Inhalation Aerosol Solution 108 (90 Base) MCG/ACT] 1 each 2    Sig: INHALE 2 PUFFS INTO THE LUNGS EVERY 6 HOURS AS NEEDED FOR WHEEZING OR SHORTNESS OF BREATH.     Pulmonology:  Beta Agonists 2 Passed - 05/09/2023  3:46 PM      Passed - Last BP in normal range    BP Readings from Last 1 Encounters:  01/24/23 93/67         Passed - Last Heart Rate in normal range    Pulse Readings from Last 1 Encounters:  01/24/23 (!) 104         Passed - Valid encounter within last 12 months    Recent Outpatient Visits           4 weeks ago Type 2 diabetes mellitus with diabetic peripheral angiopathy without gangrene, without long-term current use of insulin [facility])   [place] [facility] [place], Salvadore [place], NP   1 month ago Chronic nausea   [place] [facility] [place], [place], NP   3 months ago Hyperlipidemia associated with type 2 diabetes mellitus [facility])   [place] [facility] [place], Salvadore Oxford, NP   4 months ago Mixed hyperlipidemia   [place] [facility] [place], Salvadore Oxford, NP   11 months ago Prediabetes   [place] [facility] [place], Salvadore [place], [place]

## 2023-05-13 ENCOUNTER — Ambulatory Visit: Payer: Self-pay | Admitting: *Deleted

## 2023-05-13 DIAGNOSIS — R110 Nausea: Secondary | ICD-10-CM

## 2023-05-13 MED ORDER — PROMETHAZINE HCL 12.5 MG PO TABS
12.5000 mg | ORAL_TABLET | Freq: Three times a day (TID) | ORAL | 2 refills | Status: DC | PRN
Start: 2023-05-13 — End: 2023-06-07

## 2023-05-13 NOTE — Addendum Note (Signed)
Addended by: Smitty Cords on: 05/13/2023 05:02 PM   Modules accepted: Orders

## 2023-05-13 NOTE — Telephone Encounter (Signed)
Message from Ricardo Cervantes sent at 05/13/2023  2:21 PM EDT  Summary: nausea & vomiting   Patient has called to request a refill of promethazine (PHENERGAN) 12.5 MG tablet, he states he has been out since Friday & has been sick ever since. He is having a lot of nausea & vomiting bile for the past 2 days & has not ate in 2 days. Patient states he requested a refill of this medication via his pharmacy but he can not wait 2 or 3 days for this refill and needs some relief.  Patient is requesting a 30 day supply, he needs more quantity he states.   Patients callback # [phone]          Call History  Contact Date/Time Type Contact Phone/Fax User  05/13/2023 02:17 PM EDT Phone (Incoming) Marijean Bravo Sr. (Self) [phone] (H) Muck, Army Melia   Reason for Disposition  [1] MILD or MODERATE vomiting AND [2] present > 48 hours (2 days) (Exception: Mild vomiting with associated diarrhea.)  Answer Assessment - Initial Assessment Questions 1. VOMITING SEVERITY: "How many times have you vomited in the past 24 hours?"     - MILD:  1 - 2 times/day    - MODERATE: 3 - 5 times/day, decreased oral intake without significant weight loss or symptoms of dehydration    - SEVERE: 6 or more times/day, vomits everything or nearly everything, with significant weight loss, symptoms of dehydration      I'Cervantes having vomiting and cold sweats.   Everything is ok except my stomach.  My BMs are normal.    I can't eat or hold anything down.   I'Cervantes vomiting every 2 hours.   I've had this for a while now.   I have an appt with GI but it's not until Feb. 2025.  Somehow I've got to survive until then.   The medicine Phenergan Rene Kocher prescribes for me really helps.   Nothing else really helps. 2. ONSET: "When did the vomiting begin?"      2 days ago this time.     I've been this way for 4 months now. Phenergan 12.5 mg really helps me more than anything.   3. FLUIDS: "What fluids or food have you vomited up today?" "Have  you been able to keep any fluids down?"     I didn't drink anything yesterday.   A little water today.    I'Cervantes urinating ok.   It's starting to get dark today.   Yesterday my urine looked normal but it's getting darker now.   I'Cervantes trying to drink a little water but every time I do I start vomiting again. 4. ABDOMEN PAIN: "Are your having any abdomen pain?" If Yes : "How bad is it and what does it feel like?" (e.g., crampy, dull, intermittent, constant)      Not asked 5. DIARRHEA: "Is there any diarrhea?" If Yes, ask: "How many times today?"      No 6. CONTACTS: "Is there anyone else in the family with the same symptoms?"      Not asked    7. CAUSE: "What do you think is causing your vomiting?"     They don't know 8. HYDRATION STATUS: "Any signs of dehydration?" (e.g., dry mouth [not only dry lips], too weak to stand) "When did you last urinate?"     My urine is starting to get a little darker today but I'Cervantes urinating my normal amount. 9. OTHER SYMPTOMS: "Do you have  any other symptoms?" (e.g., fever, headache, vertigo, vomiting blood or coffee grounds, recent head injury)     Nausea, vomiting and weak. 10. PREGNANCY: "Is there any chance you are pregnant?" "When was your last menstrual period?"       N/A  Protocols used: Vomiting-A-AH

## 2023-05-13 NOTE — Telephone Encounter (Signed)
  Chief Complaint: Needing a refill on the Phenergan 12.5 mg tablet. Symptoms: He has been vomiting for 2 days now.   He has been having N/V for 4 months now.   He has a GI consult for Feb. 2025.   Frequency: Daily for the last 2 days this episode.  Pertinent Negatives: Patient denies blood in stool or anything else being wrong just can'[t stop this vomiting and can't keep anything down including water except for sips at the time. Disposition: [] ED /[] Urgent Care (no appt availability in office) / [] Appointment(In office/virtual)/ []  [facility]/ [] Home Care/ [] Refused Recommended Disposition /[] [place] [facility]/ [x]  Follow-up with PCP Additional Notes: Requesting an urgent refill of the Phenergan to [facility].   Nicki Reaper, NP is out of the office today so sending this to Dr. Lew Dawes for possible approval.

## 2023-05-13 NOTE — Telephone Encounter (Signed)
Refill Phenergan sent to [facility]  Saralyn Pilar, DO [facility] 05/13/2023, 5:02 PM

## 2023-05-20 ENCOUNTER — Ambulatory Visit: Payer: Self-pay

## 2023-05-20 NOTE — Telephone Encounter (Signed)
Summary: itching and draining in ear   Pt called in states has itching in his ear and draining.    Called pt - left message to return our call.

## 2023-05-20 NOTE — Telephone Encounter (Signed)
  Chief Complaint: Discharge from right ear.  Symptoms: clear discharge - itchy Frequency: 2 months Pertinent Negatives: Patient denies pain, swelling,  Disposition: [] ED /[] Urgent Care (no appt availability in office) / [x] Appointment(In office/virtual)/ []  [place] [facility]/ [] Home Care/ [] Refused Recommended Disposition /[] [place] [facility]/ []  Follow-up with PCP Additional Notes: Pt states that he has a clear discharge form his left ear for the past 2 months. Discharge is clear, and sometimes "crusty" . Pt states the ear is itchy. No pain swelling, blood or off odor to ear. Hearing is not effected.  Pt would like to be seen this week by [facility] Appt made for next week. Please advise.   Summary: itching and draining in ear   Pt called in states has itching in his ear and draining.     Reason for Disposition  [1] Clear ear discharge AND [2] present < 24 hours  Answer Assessment - Initial Assessment Questions 1. LOCATION: "Which ear is involved?"      Right ear 2. COLOR: "What is the color of the discharge?"      Clear - white 3. CONSISTENCY: "How runny is the discharge? Could it be water?"      runny 4. ONSET: "When did you first notice the discharge?"     months 5. PAIN: "Is there any earache?" "How bad is it?"  (Scale 1-10; or mild, moderate, severe)     itching 6. OBJECTS: "Have you put anything in your ear?" (e.g., Q-tip, other object)      h2o2 7. OTHER SYMPTOMS: "Do you have any other symptoms?" (e.g., headache, fever, dizziness, vomiting, runny nose)     no 8. PREGNANCY: "Is there any chance you are pregnant?" "When was your last menstrual period?"     *No Answer*  Protocols used: Ear - Discharge-A-AH

## 2023-05-20 NOTE — Telephone Encounter (Signed)
Patient called, left VM to return the call to the office to speak to the NT.    Summary: itching and draining in ear   Pt called in states has itching in his ear and draining.

## 2023-05-30 ENCOUNTER — Ambulatory Visit (INDEPENDENT_AMBULATORY_CARE_PROVIDER_SITE_OTHER): Payer: Medicare HMO | Admitting: Internal Medicine

## 2023-05-30 ENCOUNTER — Encounter: Payer: Self-pay | Admitting: Internal Medicine

## 2023-05-30 VITALS — BP 112/64 | HR 91 | Temp 96.5°F | Wt 155.0 lb

## 2023-05-30 DIAGNOSIS — H6122 Impacted cerumen, left ear: Secondary | ICD-10-CM

## 2023-05-30 DIAGNOSIS — H60541 Acute eczematoid otitis externa, right ear: Secondary | ICD-10-CM

## 2023-05-30 MED ORDER — NEOMYCIN-POLYMYXIN-HC 3.5-10000-1 OT SUSP: 4.0000 [drp] | Freq: Four times a day (QID) | OTIC | 0 refills | Status: DC

## 2023-05-30 MED ORDER — APIXABAN 5 MG PO TABS: 5.0000 mg | ORAL_TABLET | Freq: Two times a day (BID) | ORAL | 1 refills | Status: DC

## 2023-05-30 MED ORDER — OMEPRAZOLE 40 MG PO CPDR: 40.0000 mg | DELAYED_RELEASE_CAPSULE | Freq: Two times a day (BID) | ORAL | 1 refills | Status: DC

## 2023-05-30 MED ORDER — EMPAGLIFLOZIN 10 MG PO TABS: 10.0000 mg | ORAL_TABLET | Freq: Every day | ORAL | 1 refills | Status: DC

## 2023-05-30 NOTE — Progress Notes (Signed)
Subjective:    Patient ID: Ricardo Bravo Sr., male    DOB: 03-19-1956, 67 y.o.   MRN: [id number]  HPI  Patient presents to clinic today with complaint of discharge from his right ear.  He noticed this a few months ago. He reports the discharge is clear/white. He is unsure if there is any odor. He does have some itching in the ear canal. He denies difficulty hearing more than normal. He tried OTC ear drops in [place] and he reports this made it better for a short period of time.   Review of Systems     Past Medical History:  Diagnosis Date   Arthritis    Rheumatoid   Bipolar disorder (HCC)    Complication of anesthesia    difficulty keeping patient asleep. fentanyl makes patient mean, grumpy and difficult   Depression    Fusion of spine    GERD (gastroesophageal reflux disease)    slightly uncomfortable. takes otc antacids occasionally   Headache    deteriorating discs causing headaches. uses advil   Hepatitis C    HEP "C". treated 2 years ago   History of kidney stones 2016   Hyperlipidemia    Pericarditis    20 years ago   Peripheral vascular disease (HCC)    Smoker     Current Outpatient Medications  Medication Sig Dispense Refill   albuterol (VENTOLIN HFA) 108 (90 Base) MCG/ACT inhaler INHALE 2 PUFFS INTO THE LUNGS EVERY 6 HOURS AS NEEDED FOR WHEEZING OR SHORTNESS OF BREATH. 1 each 2   apixaban (ELIQUIS) 5 MG TABS tablet TAKE 1 TABLET TWICE DAILY 180 tablet 1   atorvastatin (LIPITOR) 80 MG tablet Take 1 tablet (80 mg total) by mouth daily. 90 tablet 1   digoxin (LANOXIN) 0.25 MG tablet Take 1 tablet (250 mcg total) by mouth daily. 90 tablet 0   DULoxetine (CYMBALTA) 60 MG capsule TAKE 1 CAPSULE EVERY DAY 90 capsule 1   empagliflozin (JARDIANCE) 10 MG TABS tablet TAKE 1 TABLET EVERY DAY BEFORE BREAKFAST 90 tablet 1   Fluticasone-Umeclidin-Vilant (TRELEGY ELLIPTA) 100-62.5-25 MCG/ACT AEPB INHALE 1 PUFF INTO THE LUNGS DAILY 180 each 1   gabapentin (NEURONTIN) 300 MG  capsule Take 1 capsule (300 mg total) by mouth at bedtime. In addition to 600 mg for a total of 900 mg QHS 90 capsule 1   gabapentin (NEURONTIN) 600 MG tablet TAKE 1 TABLET THREE TIMES DAILY 270 tablet 1   metoprolol succinate (TOPROL-XL) 25 MG 24 hr tablet Take 1 tablet (25 mg total) by mouth daily. 90 tablet 1   omeprazole (PRILOSEC) 40 MG capsule Take 1 capsule (40 mg total) by mouth daily. 90 capsule 1   promethazine (PHENERGAN) 12.5 MG tablet Take 1 tablet (12.5 mg total) by mouth every 8 (eight) hours as needed. 20 tablet 2   sildenafil (VIAGRA) 50 MG tablet TAKE 1 TABLET EVERY DAY AS NEEDED FOR ERECTILE DYSFUNCTION 90 tablet 0   No current facility-administered medications for this visit.    Allergies  Allergen Reactions   Chantix [Varenicline] Other (See Comments)    Depression    Family History  Problem Relation Age of Onset   Cancer Mother        liver   Heart disease Father    Diabetes Father    Breast cancer Sister    Brain cancer Sister     Social History   Socioeconomic History   Marital status: Single    Spouse name: Not on file  Number of children: Not on file   Years of education: Not on file   Highest education level: Not on file  Occupational History   Not on file  Tobacco Use   Smoking status: Every Day    Current packs/day: 0.50    Average packs/day: 0.5 packs/day for 40.0 years (20.0 ttl pk-yrs)    Types: Cigarettes   Smokeless tobacco: Never  Vaping Use   Vaping status: Former  Substance and Sexual Activity   Alcohol use: Not Currently    Alcohol/week: 2.0 standard drinks of alcohol    Types: 2 Shots of liquor per week    Comment: occasional (every 3-4 weeks)    Drug use: No    Comment: polysubstance approx 2 years ago   Sexual activity: Not Currently  Other Topics Concern   Not on file  Social History Narrative   Not on file   Social Determinants of Health   Financial Resource Strain: Low Risk  (02/28/2023)   Overall Financial  Resource Strain (CARDIA)    Difficulty of Paying Living Expenses: Not hard at all  Food Insecurity: No Food Insecurity (02/28/2023)   Hunger Vital Sign    Worried About Running Out of Food in the Last Year: Never true    Ran Out of Food in the Last Year: Never true  Transportation Needs: No Transportation Needs (02/28/2023)   PRAPARE - Administrator, Civil Service (Medical): No    Lack of Transportation (Non-Medical): No  Physical Activity: Inactive (02/28/2023)   Exercise Vital Sign    Days of Exercise per Week: 0 days    Minutes of Exercise per Session: 0 min  Stress: No Stress Concern Present (02/28/2023)   [employer] of Occupational Health - Occupational Stress Questionnaire    Feeling of Stress : Not at all  Social Connections: Moderately Isolated (02/28/2023)   Social Connection and Isolation Panel [NHANES]    Frequency of Communication with Friends and Family: More than three times a week    Frequency of Social Gatherings with Friends and Family: Once a week    Attends Religious Services: Never    Database administrator or Organizations: No    Attends Banker Meetings: Never    Marital Status: Married  Catering manager Violence: Not At Risk (02/28/2023)   Humiliation, Afraid, Rape, and Kick questionnaire    Fear of Current or Ex-Partner: No    Emotionally Abused: No    Physically Abused: No    Sexually Abused: No     Constitutional: Denies fever, malaise, fatigue, headache or abrupt weight changes.  HEENT: Patient reports right ear itching and discharge.  Denies eye pain, eye redness, ear pain, ringing in the ears, wax buildup, runny nose, nasal congestion, bloody nose, or sore throat. Respiratory: Denies difficulty breathing, shortness of breath, cough or sputum production.   Cardiovascular: Denies chest pain, chest tightness, palpitations or swelling in the hands or feet.   No other specific complaints in a complete review of systems (except  as listed in HPI above).  Objective:   Physical Exam  BP 112/64 (BP Location: Left Arm, Patient Position: Sitting, Cuff Size: Normal)   Pulse 91   Temp (!) 96.5 F (35.8 C) (Temporal)   Wt 155 lb (70.3 kg)   SpO2 93%   BMI 19.90 kg/m   Wt Readings from Last 3 Encounters:  02/28/23 150 lb (68 kg)  01/24/23 151 lb (68.5 kg)  01/10/23 160 lb (72.6 kg)  General: Appears his stated age, chronic ill-appearing, in NAD. HEENT: Head: normal shape and size; Eyes: sclera white, no icterus, conjunctiva pink, PERRLA and EOMs intact; Right Ears: Tm's gray and intact, normal light reflex; Left Ear; cerumen impaction.   Neck:  Neck supple, trachea midline. No masses, lumps or thyromegaly present.  Cardiovascular: Normal rate and rhythm.  Pulmonary/Chest: Normal effort and positive vesicular breath sounds. No respiratory distress. No wheezes, rales or ronchi noted.  Neurological: Alert and oriented.   BMET    Component Value Date/Time   NA 140 04/22/2023 1313   NA 137 11/14/2020 1409   K 3.7 04/22/2023 1313   CL 102 04/22/2023 1313   CO2 27 04/22/2023 1313   GLUCOSE 116 04/22/2023 1313   BUN 16 04/22/2023 1313   BUN 15 11/14/2020 1409   CREATININE 0.64 (L) 04/22/2023 1313   CALCIUM 9.7 04/22/2023 1313   GFRNONAA 94 12/18/2017 1137   GFRAA 109 12/18/2017 1137    Lipid Panel     Component Value Date/Time   CHOL 148 04/22/2023 1313   CHOL 217 (H) 12/23/2015 1606   TRIG 131 04/22/2023 1313   HDL 41 04/22/2023 1313   HDL 29 (L) 12/23/2015 1606   CHOLHDL 3.6 04/22/2023 1313   LDLCALC 84 04/22/2023 1313    CBC    Component Value Date/Time   WBC 6.8 04/22/2023 1313   RBC 5.03 04/22/2023 1313   HGB 15.4 04/22/2023 1313   HGB 12.9 (L) 11/14/2020 1409   HCT 47.3 04/22/2023 1313   HCT 40.0 11/14/2020 1409   PLT 214 04/22/2023 1313   PLT 287 11/14/2020 1409   MCV 94.0 04/22/2023 1313   MCV 95 11/14/2020 1409   MCH 30.6 04/22/2023 1313   MCHC 32.6 04/22/2023 1313   RDW 13.4  04/22/2023 1313   RDW 14.4 11/14/2020 1409   LYMPHSABS 0.9 11/14/2020 1409   MONOABS 0.4 06/13/2017 1502   EOSABS 0.2 11/14/2020 1409   BASOSABS 0.0 11/14/2020 1409    Hgb A1C Lab Results  Component Value Date   HGBA1C 5.9 (H) 04/22/2023           Assessment & Plan:   Eczematoid dermatitis right ear, cerumen impaction left ear:  Advised him to try Debrox OTC to remove wax from his left ear Rx for Cortisporin 4 times daily for 7 days  RTC in 2 months for annual exam Nicki Reaper, NP

## 2023-05-30 NOTE — Patient Instructions (Signed)
Earwax Buildup, Adult Your ears make something called earwax. It helps keep germs called bacteria away and protects the skin in your ears. Sometimes, too much earwax can build up. This can cause discomfort or make it harder to hear. What are the causes? Earwax buildup can happen when you have too much earwax in your ears. Earwax is made in the outer part of your ear canal. It's supposed to fall out in small amounts over time. But if your ears aren't able to clean themselves like they should, earwax can build up. What increases the risk? You're more likely to get earwax buildup if: You clean your ears with cotton swabs. You pick at your ears. You use earplugs or in-ear headphones a lot. You wear hearing aids. You may also be more likely to get it if: You're male. You're older. Your ears naturally make more earwax. You have narrow ear canals or extra hair in your ears. Your earwax is too thick or sticky. You have eczema. You're dehydrated. This means there's not enough fluid in your body. What are the signs or symptoms? Symptoms of earwax buildup include: Not being able to hear as well. A feeling of fullness in your ear. Feeling like your ear is plugged. Fluid coming from your ear. Ear pain or an itchy ear. Ringing in your ear. Coughing or problems with balance. How is this diagnosed? Earwax buildup may be diagnosed based on your symptoms, medical history, and an ear exam. During the exam, your health care provider will look into your ear with a tool called an otoscope. You may also have tests, such as a hearing test. How is this treated? Earwax buildup may be treated by: Using ear drops. Having the earwax removed by a provider. The provider may: Flush the ear with water. Use a tool called a curette that has a loop on the end. Use a suction device. Having surgery. This may be done in severe cases. Follow these instructions at home:  Cleaning your ears Clean your ears as told  by your provider. You can clean the outside of your ears with a washcloth or tissue. Do not overclean your ears. Do not put anything into your ear unless told. This includes cotton swabs. General instructions Take over-the-counter and prescription medicines only as told by your provider. Drink enough fluid to keep your pee (urine) pale yellow. This helps thin the earwax. If you have hearing aids, clean them as told. Keep all follow-up visits. If earwax builds up in your ears often or if you use hearing aids, ask your provider how often you should have your ears cleaned. Contact a health care provider if: Your ear pain gets worse. You have a fever. You have pus, blood, or other fluid coming from your ear. You have hearing loss. You have ringing in your ears that won't go away. You feel like the room is spinning. This is called vertigo. Your symptoms don't get better with treatment. This information is not intended to replace advice given to you by your health care provider. Make sure you discuss any questions you have with your health care provider. Document Revised: 11/01/2022 Document Reviewed: 11/01/2022 Elsevier Patient Education  2024 [employer].

## 2023-06-07 ENCOUNTER — Other Ambulatory Visit: Payer: Self-pay | Admitting: Family Medicine

## 2023-06-07 DIAGNOSIS — I714 Abdominal aortic aneurysm, without rupture, unspecified: Secondary | ICD-10-CM | POA: Diagnosis not present

## 2023-06-07 DIAGNOSIS — I5022 Chronic systolic (congestive) heart failure: Secondary | ICD-10-CM | POA: Diagnosis not present

## 2023-06-07 DIAGNOSIS — I50810 Right heart failure, unspecified: Secondary | ICD-10-CM | POA: Diagnosis not present

## 2023-06-07 DIAGNOSIS — R110 Nausea: Secondary | ICD-10-CM

## 2023-06-07 DIAGNOSIS — Z5181 Encounter for therapeutic drug level monitoring: Secondary | ICD-10-CM | POA: Diagnosis not present

## 2023-06-07 DIAGNOSIS — R0602 Shortness of breath: Secondary | ICD-10-CM | POA: Diagnosis not present

## 2023-06-07 DIAGNOSIS — I480 Paroxysmal atrial fibrillation: Secondary | ICD-10-CM | POA: Diagnosis not present

## 2023-06-07 NOTE — Telephone Encounter (Signed)
Requested medication (s) are due for refill today:  Provider to review  Requested medication (s) are on the active medication list:   Yes  Future visit scheduled:   Yes    Last ordered: 05/13/2023 #20, 2 refills  Non delegated refill   Requested Prescriptions  Pending Prescriptions Disp Refills   promethazine (PHENERGAN) 12.5 MG tablet [Pharmacy Med Name: PROMETHAZINE HCL 12.5 MG TAB] 20 tablet 2    Sig: TAKE 1 TABLET BY MOUTH EVERY 8 HOURS AS NEEDED     Not Delegated - Gastroenterology: Antiemetics Failed - 06/07/2023 11:12 AM      Failed - This refill cannot be delegated      Passed - Valid encounter within last 6 months    Recent Outpatient Visits           1 week ago Impacted cerumen of left ear   [place] [facility] [place], [place] W, NP   1 month ago Type 2 diabetes mellitus with diabetic peripheral angiopathy without gangrene, without long-term current use of insulin [facility])   [place] [facility] [place], Salvadore Oxford, NP   2 months ago Chronic nausea   [place] [facility] [place], [place] W, NP   4 months ago Hyperlipidemia associated with type 2 diabetes mellitus [facility])   [place] [facility] [place], Salvadore Oxford, NP   4 months ago Mixed hyperlipidemia   [place] [facility] [place], Salvadore Oxford, NP       Future Appointments             In 1 month [place], Salvadore Oxford, NP [place] [facility], [facility]

## 2023-06-13 ENCOUNTER — Other Ambulatory Visit: Payer: Self-pay | Admitting: Internal Medicine

## 2023-06-13 NOTE — Telephone Encounter (Signed)
Requested medication (s) are due for refill today: Yes  Requested medication (s) are on the active medication list: Yes  Last refill:  03/13/23  Future visit scheduled: Yes  Notes to clinic:  Unable to refill per protocol due to failed labs, no updated results.      Requested Prescriptions  Pending Prescriptions Disp Refills   digoxin (LANOXIN) 0.25 MG tablet [Pharmacy Med Name: Digoxin Oral Tablet 250 MCG] 90 tablet 3    Sig: TAKE 1 TABLET EVERY DAY     Cardiovascular:  Antiarrhythmic Agents - digoxin Failed - 06/13/2023  3:55 PM      Failed - Cr in normal range and within 180 days    Creat  Date Value Ref Range Status  04/22/2023 0.64 (L) 0.70 - 1.35 mg/dL Final   Creatinine, Urine  Date Value Ref Range Status  04/22/2023 86 20 - 320 mg/dL Final         Failed - Digoxin (serum) in normal range and within 360 days    No results found for: "DIGOXIN"       Failed - Mg Level in normal range and within 360 days    Magnesium  Date Value Ref Range Status  12/14/2021 2.0 1.5 - 2.5 mg/dL Final         Failed - Patient had ECG in the last 360 days      Passed - Ca in normal range and within 360 days    Calcium  Date Value Ref Range Status  04/22/2023 9.7 8.6 - 10.3 mg/dL Final         Passed - K in normal range and within 180 days    Potassium  Date Value Ref Range Status  04/22/2023 3.7 3.5 - 5.3 mmol/L Final         Passed - Last Heart Rate in normal range    Pulse Readings from Last 1 Encounters:  05/30/23 91         Passed - Valid encounter within last 6 months    Recent Outpatient Visits           2 weeks ago Impacted cerumen of left ear   [place] [facility] [place], Salvadore Oxford, NP   2 months ago Type 2 diabetes mellitus with diabetic peripheral angiopathy without gangrene, without long-term current use of insulin [facility])   [place] [facility] [place], Salvadore Oxford, NP   2 months ago Chronic nausea   [place] [facility] [place], [place] W, NP   4 months ago Hyperlipidemia associated with type 2 diabetes mellitus [facility])   [place] [facility] [place], Salvadore Oxford, NP   5 months ago Mixed hyperlipidemia   [place] [facility] [place], Salvadore Oxford, NP       Future Appointments             In 3 weeks [place], Salvadore Oxford, NP [place] [facility], [facility]

## 2023-06-17 DIAGNOSIS — I5020 Unspecified systolic (congestive) heart failure: Secondary | ICD-10-CM | POA: Diagnosis not present

## 2023-06-17 DIAGNOSIS — I50810 Right heart failure, unspecified: Secondary | ICD-10-CM | POA: Diagnosis not present

## 2023-06-20 ENCOUNTER — Other Ambulatory Visit: Payer: Self-pay | Admitting: Internal Medicine

## 2023-06-20 NOTE — Telephone Encounter (Signed)
Requested Prescriptions  Pending Prescriptions Disp Refills   atorvastatin (LIPITOR) 80 MG tablet [Pharmacy Med Name: Atorvastatin Calcium Oral Tablet 80 MG] 90 tablet 0    Sig: TAKE 1 TABLET EVERY DAY     Cardiovascular:  Antilipid - Statins Failed - 06/20/2023  2:53 AM      Failed - Lipid Panel in normal range within the last 12 months    Cholesterol, Total  Date Value Ref Range Status  12/23/2015 217 (H) 100 - 199 mg/dL Final   Cholesterol  Date Value Ref Range Status  04/22/2023 148 <200 mg/dL Final   LDL Cholesterol (Calc)  Date Value Ref Range Status  04/22/2023 84 mg/dL (calc) Final    Comment:    Reference range: <100 . Desirable range <100 mg/dL for primary prevention;   <70 mg/dL for patients with CHD or diabetic patients  with > or = 2 CHD risk factors. [name] LDL-C is now calculated using the Martin-Hopkins  calculation, which is a validated novel method providing  better accuracy than the Friedewald equation in the  estimation of LDL-C.  Horald Pollen et al. Lenox Ahr. 0981;191(47): 2061-2068  (http://education.QuestDiagnostics.com/faq/FAQ164)    HDL  Date Value Ref Range Status  04/22/2023 41 > OR = 40 mg/dL Final  82/95/6213 29 (L) >39 mg/dL Final   Triglycerides  Date Value Ref Range Status  04/22/2023 131 <150 mg/dL Final         Passed - Patient is not pregnant      Passed - Valid encounter within last 12 months    Recent Outpatient Visits           3 weeks ago Impacted cerumen of left ear   [facility] [place], [place] W, NP   2 months ago Type 2 diabetes mellitus with diabetic peripheral angiopathy without gangrene, without long-term current use of insulin [facility])   [place] [facility] [place], Salvadore Oxford, NP   3 months ago Chronic nausea   [place] [facility], [place] W, NP   4 months ago Hyperlipidemia associated with type 2 diabetes mellitus [facility])   [place] [facility] [place], Salvadore Oxford, NP   5 months ago Mixed hyperlipidemia   [place] [facility] [place], Salvadore Oxford, NP       Future Appointments             In 2 weeks Sam[place] Si, Salvadore Oxford, NP [place] [facility], [facility]

## 2023-06-27 ENCOUNTER — Other Ambulatory Visit: Payer: Self-pay | Admitting: Internal Medicine

## 2023-06-28 NOTE — Telephone Encounter (Signed)
Requested Prescriptions  Pending Prescriptions Disp Refills   DULoxetine (CYMBALTA) 60 MG capsule [Pharmacy Med Name: DULoxetine HCl Oral Capsule Delayed Release Particles 60 MG] 90 capsule 1    Sig: TAKE 1 CAPSULE EVERY DAY     Psychiatry: Antidepressants - SNRI - duloxetine Failed - 06/27/2023  2:56 AM      Failed - Cr in normal range and within 360 days    Creat  Date Value Ref Range Status  04/22/2023 0.64 (L) 0.70 - 1.35 mg/dL Final   Creatinine, Urine  Date Value Ref Range Status  04/22/2023 86 20 - 320 mg/dL Final         Passed - eGFR is 30 or above and within 360 days    GFR, Est African American  Date Value Ref Range Status  12/18/2017 109 > OR = 60 mL/min/1.9m2 Final   GFR, Est Non African American  Date Value Ref Range Status  12/18/2017 94 > OR = 60 mL/min/1.27m2 Final   eGFR  Date Value Ref Range Status  04/22/2023 105 > OR = 60 mL/min/1.98m2 Final  11/14/2020 105 >59 mL/min/1.73 Final         Passed - Completed PHQ-2 or PHQ-9 in the last 360 days      Passed - Last BP in normal range    BP Readings from Last 1 Encounters:  05/30/23 112/64         Passed - Valid encounter within last 6 months    Recent Outpatient Visits           4 weeks ago Impacted cerumen of left ear   [place] [facility] [place], Salvadore Oxford, NP   2 months ago Type 2 diabetes mellitus with diabetic peripheral angiopathy without gangrene, without long-term current use of insulin [facility])   [place] [facility] [place], Salvadore Oxford, NP   3 months ago Chronic nausea   [place] [facility] [place], [place] W, NP   5 months ago Hyperlipidemia associated with type 2 diabetes mellitus [facility])   [place] [facility] [place], Salvadore Oxford, NP   5 months ago Mixed hyperlipidemia   [place] [facility] [place], Salvadore Oxford, NP       Future Appointments             In 1 week Sam[place] Si, Salvadore Oxford, NP  [facility], [facility]

## 2023-07-02 ENCOUNTER — Other Ambulatory Visit: Payer: Self-pay | Admitting: Internal Medicine

## 2023-07-02 DIAGNOSIS — R110 Nausea: Secondary | ICD-10-CM

## 2023-07-03 NOTE — Telephone Encounter (Signed)
Requested medication (s) are due for refill today:   Provider to review  Requested medication (s) are on the active medication list:   Yes  Future visit scheduled:   Yes 11/5 with Rene Kocher   Last ordered: 06/07/2023 #20, 2 refills  Non delegated refill    Requested Prescriptions  Pending Prescriptions Disp Refills   promethazine (PHENERGAN) 12.5 MG tablet [Pharmacy Med Name: PROMETHAZINE HCL 12.5 MG TAB] 20 tablet 2    Sig: TAKE 1 TABLET BY MOUTH EVERY 8 HOURS AS NEEDED     Not Delegated - Gastroenterology: Antiemetics Failed - 07/02/2023 11:08 AM      Failed - This refill cannot be delegated      Passed - Valid encounter within last 6 months    Recent Outpatient Visits           1 month ago Impacted cerumen of left ear   [place] [place], [place] W, NP   2 months ago Type 2 diabetes mellitus with diabetic peripheral angiopathy without gangrene, without long-term current use of insulin [facility])   [place] [facility] [place], Salvadore Oxford, NP   3 months ago Chronic nausea   [place] [facility] [place], [place] W, NP   5 months ago Hyperlipidemia associated with type 2 diabetes mellitus [facility])   [place] [facility] [place], Salvadore Oxford, NP   5 months ago Mixed hyperlipidemia   [place] [facility] [place], Salvadore Oxford, NP       Future Appointments             In 6 days [place], Salvadore Oxford, NP [place] [facility], [facility]

## 2023-07-09 ENCOUNTER — Ambulatory Visit (INDEPENDENT_AMBULATORY_CARE_PROVIDER_SITE_OTHER): Payer: Medicare HMO | Admitting: Internal Medicine

## 2023-07-09 ENCOUNTER — Encounter: Payer: Self-pay | Admitting: Internal Medicine

## 2023-07-09 ENCOUNTER — Other Ambulatory Visit: Payer: Self-pay | Admitting: Internal Medicine

## 2023-07-09 VITALS — BP 108/68 | HR 85 | Ht 74.0 in | Wt 156.0 lb

## 2023-07-09 DIAGNOSIS — E1151 Type 2 diabetes mellitus with diabetic peripheral angiopathy without gangrene: Secondary | ICD-10-CM

## 2023-07-09 DIAGNOSIS — D649 Anemia, unspecified: Secondary | ICD-10-CM | POA: Diagnosis not present

## 2023-07-09 DIAGNOSIS — R110 Nausea: Secondary | ICD-10-CM

## 2023-07-09 DIAGNOSIS — Z125 Encounter for screening for malignant neoplasm of prostate: Secondary | ICD-10-CM | POA: Diagnosis not present

## 2023-07-09 DIAGNOSIS — Z122 Encounter for screening for malignant neoplasm of respiratory organs: Secondary | ICD-10-CM | POA: Diagnosis not present

## 2023-07-09 DIAGNOSIS — K219 Gastro-esophageal reflux disease without esophagitis: Secondary | ICD-10-CM

## 2023-07-09 DIAGNOSIS — Z0001 Encounter for general adult medical examination with abnormal findings: Secondary | ICD-10-CM

## 2023-07-09 MED ORDER — PANTOPRAZOLE SODIUM 40 MG PO TBEC: 40.0000 mg | DELAYED_RELEASE_TABLET | Freq: Two times a day (BID) | ORAL | 1 refills | Status: DC

## 2023-07-09 NOTE — Patient Instructions (Signed)
Health Maintenance After Age 65 After age 65, you are at a higher risk for certain long-term diseases and infections as well as injuries from falls. Falls are a major cause of broken bones and head injuries in people who are older than age 65. Getting regular preventive care can help to keep you healthy and well. Preventive care includes getting regular testing and making lifestyle changes as recommended by your health care provider. Talk with your health care provider about: Which screenings and tests you should have. A screening is a test that checks for a disease when you have no symptoms. A diet and exercise plan that is right for you. What should I know about screenings and tests to prevent falls? Screening and testing are the best ways to find a health problem early. Early diagnosis and treatment give you the best chance of managing medical conditions that are common after age 65. Certain conditions and lifestyle choices may make you more likely to have a fall. Your health care provider may recommend: Regular vision checks. Poor vision and conditions such as cataracts can make you more likely to have a fall. If you wear glasses, make sure to get your prescription updated if your vision changes. Medicine review. Work with your health care provider to regularly review all of the medicines you are taking, including over-the-counter medicines. Ask your health care provider about any side effects that may make you more likely to have a fall. Tell your health care provider if any medicines that you take make you feel dizzy or sleepy. Strength and balance checks. Your health care provider may recommend certain tests to check your strength and balance while standing, walking, or changing positions. Foot health exam. Foot pain and numbness, as well as not wearing proper footwear, can make you more likely to have a fall. Screenings, including: Osteoporosis screening. Osteoporosis is a condition that causes  the bones to get weaker and break more easily. Blood pressure screening. Blood pressure changes and medicines to control blood pressure can make you feel dizzy. Depression screening. You may be more likely to have a fall if you have a fear of falling, feel depressed, or feel unable to do activities that you used to do. Alcohol use screening. Using too much alcohol can affect your balance and may make you more likely to have a fall. Follow these instructions at home: Lifestyle Do not drink alcohol if: Your health care provider tells you not to drink. If you drink alcohol: Limit how much you have to: 0-1 drink a day for women. 0-2 drinks a day for men. Know how much alcohol is in your drink. In the [place] one drink equals one 12 oz bottle of beer (355 mL), one 5 oz glass of wine (148 mL), or one 1 oz glass of hard liquor (44 mL). Do not use any products that contain nicotine or tobacco. These products include cigarettes, chewing tobacco, and vaping devices, such as e-cigarettes. If you need help quitting, ask your health care provider. Activity  Follow a regular exercise program to stay fit. This will help you maintain your balance. Ask your health care provider what types of exercise are appropriate for you. If you need a cane or walker, use it as recommended by your health care provider. Wear supportive shoes that have nonskid soles. Safety  Remove any tripping hazards, such as rugs, cords, and clutter. Install safety equipment such as grab bars in bathrooms and safety rails on stairs. Keep rooms and walkways   well-lit. General instructions Talk with your health care provider about your risks for falling. Tell your health care provider if: You fall. Be sure to tell your health care provider about all falls, even ones that seem minor. You feel dizzy, tiredness (fatigue), or off-balance. Take over-the-counter and prescription medicines only as told by your health care provider. These include  supplements. Eat a healthy diet and maintain a healthy weight. A healthy diet includes low-fat dairy products, low-fat (lean) meats, and fiber from whole grains, beans, and lots of fruits and vegetables. Stay current with your vaccines. Schedule regular health, dental, and eye exams. Summary Having a healthy lifestyle and getting preventive care can help to protect your health and wellness after age 65. Screening and testing are the best way to find a health problem early and help you avoid having a fall. Early diagnosis and treatment give you the best chance for managing medical conditions that are more common for people who are older than age 65. Falls are a major cause of broken bones and head injuries in people who are older than age 65. Take precautions to prevent a fall at home. Work with your health care provider to learn what changes you can make to improve your health and wellness and to prevent falls. This information is not intended to replace advice given to you by your health care provider. Make sure you discuss any questions you have with your health care provider. Document Revised: 01/09/2021 Document Reviewed: 01/09/2021 Elsevier Patient Education  2024 Elsevier Inc.  

## 2023-07-09 NOTE — Progress Notes (Signed)
Subjective:    Patient ID: Ricardo Bravo Sr., male    DOB: 27-Jul-1956, 67 y.o.   MRN: [id number]  HPI  Patient presents to clinic today for his annual exam.  Flu: 09/2020 Tetanus: Unsure COVID: X 2 Pneumovax: Never Prevnar: Never Shingrix: Never PSA screening: 12/2021 Colon screening: never Vision screening: annually Dentist: as needed, dentures  Diet: He does eat meat. He consumes rare veggies, no fruits. He does eat fried foods. He drinks mostly sweet tea. Exercise: None   Review of Systems     Past Medical History:  Diagnosis Date   Arthritis    Rheumatoid   Bipolar disorder (HCC)    Complication of anesthesia    difficulty keeping patient asleep. fentanyl makes patient mean, grumpy and difficult   Depression    Fusion of spine    GERD (gastroesophageal reflux disease)    slightly uncomfortable. takes otc antacids occasionally   Headache    deteriorating discs causing headaches. uses advil   Hepatitis C    HEP "C". treated 2 years ago   History of kidney stones 2016   Hyperlipidemia    Pericarditis    20 years ago   Peripheral vascular disease (HCC)    Smoker     Current Outpatient Medications  Medication Sig Dispense Refill   albuterol (VENTOLIN HFA) 108 (90 Base) MCG/ACT inhaler INHALE 2 PUFFS INTO THE LUNGS EVERY 6 HOURS AS NEEDED FOR WHEEZING OR SHORTNESS OF BREATH. 1 each 2   apixaban (ELIQUIS) 5 MG TABS tablet Take 1 tablet (5 mg total) by mouth 2 (two) times daily. 180 tablet 1   atorvastatin (LIPITOR) 80 MG tablet TAKE 1 TABLET EVERY DAY 90 tablet 0   digoxin (LANOXIN) 0.25 MG tablet TAKE 1 TABLET EVERY DAY 90 tablet 1   DULoxetine (CYMBALTA) 60 MG capsule TAKE 1 CAPSULE EVERY DAY 90 capsule 1   empagliflozin (JARDIANCE) 10 MG TABS tablet Take 1 tablet (10 mg total) by mouth daily. 90 tablet 1   Fluticasone-Umeclidin-Vilant (TRELEGY ELLIPTA) 100-62.5-25 MCG/ACT AEPB INHALE 1 PUFF INTO THE LUNGS DAILY 180 each 1   gabapentin (NEURONTIN) 300 MG  capsule Take 1 capsule (300 mg total) by mouth at bedtime. In addition to 600 mg for a total of 900 mg QHS 90 capsule 1   gabapentin (NEURONTIN) 600 MG tablet TAKE 1 TABLET THREE TIMES DAILY 270 tablet 1   metoprolol succinate (TOPROL-XL) 25 MG 24 hr tablet Take 1 tablet (25 mg total) by mouth daily. 90 tablet 1   neomycin-polymyxin-hydrocortisone (CORTISPORIN) 3.5-10000-1 OTIC suspension Place 4 drops into the right ear 4 (four) times daily. X 7 days 10 mL 0   omeprazole (PRILOSEC) 40 MG capsule Take 1 capsule (40 mg total) by mouth in the morning and at bedtime. 90 capsule 1   promethazine (PHENERGAN) 12.5 MG tablet TAKE 1 TABLET BY MOUTH EVERY 8 HOURS AS NEEDED 20 tablet 2   sildenafil (VIAGRA) 50 MG tablet TAKE 1 TABLET EVERY DAY AS NEEDED FOR ERECTILE DYSFUNCTION 90 tablet 0   No current facility-administered medications for this visit.    Allergies  Allergen Reactions   Chantix [Varenicline] Other (See Comments)    Depression    Family History  Problem Relation Age of Onset   Cancer Mother        liver   Heart disease Father    Diabetes Father    Breast cancer Sister    Brain cancer Sister     Social History  Socioeconomic History   Marital status: Single    Spouse name: Not on file   Number of children: Not on file   Years of education: Not on file   Highest education level: Not on file  Occupational History   Not on file  Tobacco Use   Smoking status: Every Day    Current packs/day: 0.50    Average packs/day: 0.5 packs/day for 40.0 years (20.0 ttl pk-yrs)    Types: Cigarettes   Smokeless tobacco: Never  Vaping Use   Vaping status: Former  Substance and Sexual Activity   Alcohol use: Not Currently    Alcohol/week: 2.0 standard drinks of alcohol    Types: 2 Shots of liquor per week    Comment: occasional (every 3-4 weeks)    Drug use: No    Comment: polysubstance approx 2 years ago   Sexual activity: Not Currently  Other Topics Concern   Not on file   Social History Narrative   Not on file   Social Determinants of Health   Financial Resource Strain: Low Risk  (02/28/2023)   Overall Financial Resource Strain (CARDIA)    Difficulty of Paying Living Expenses: Not hard at all  Food Insecurity: No Food Insecurity (02/28/2023)   Hunger Vital Sign    Worried About Running Out of Food in the Last Year: Never true    Ran Out of Food in the Last Year: Never true  Transportation Needs: No Transportation Needs (02/28/2023)   PRAPARE - Administrator, Civil Service (Medical): No    Lack of Transportation (Non-Medical): No  Physical Activity: Inactive (02/28/2023)   Exercise Vital Sign    Days of Exercise per Week: 0 days    Minutes of Exercise per Session: 0 min  Stress: No Stress Concern Present (02/28/2023)   [employer] of Occupational Health - Occupational Stress Questionnaire    Feeling of Stress : Not at all  Social Connections: Moderately Isolated (02/28/2023)   Social Connection and Isolation Panel [NHANES]    Frequency of Communication with Friends and Family: More than three times a week    Frequency of Social Gatherings with Friends and Family: Once a week    Attends Religious Services: Never    Database administrator or Organizations: No    Attends Banker Meetings: Never    Marital Status: Married  Catering manager Violence: Not At Risk (02/28/2023)   Humiliation, Afraid, Rape, and Kick questionnaire    Fear of Current or Ex-Partner: No    Emotionally Abused: No    Physically Abused: No    Sexually Abused: No     Constitutional: Denies fever, malaise, fatigue, headache or abrupt weight changes.  HEENT: Denies eye pain, eye redness, ear pain, ringing in the ears, wax buildup, runny nose, nasal congestion, bloody nose, or sore throat. Respiratory: Denies difficulty breathing, shortness of breath, cough or sputum production.   Cardiovascular: Denies chest pain, chest tightness, palpitations or  swelling in the hands or feet.  Gastrointestinal: Patient reports nausea, reflux.  Denies abdominal pain, bloating, constipation, diarrhea or blood in the stool.  GU: Patient reports erectile dysfunction.  Denies urgency, frequency, pain with urination, burning sensation, blood in urine, odor or discharge. Musculoskeletal: Denies decrease in range of motion, difficulty with gait, muscle pain or joint pain and swelling.  Skin: Denies redness, rashes, lesions or ulcercations.  Neurological: Patient orts neuropathic pain.  Denies dizziness, difficulty with memory, difficulty with speech or problems with balance and  coordination.  Psych: Patient has a history of depression.  Denies anxiety, SI/HI.  No other specific complaints in a complete review of systems (except as listed in HPI above).  Objective:   Physical Exam  BP 108/68   Pulse 85   Ht 6\' 2"  (1.88 m)   Wt 156 lb (70.8 kg)   SpO2 97%   BMI 20.03 kg/m   Wt Readings from Last 3 Encounters:  05/30/23 155 lb (70.3 kg)  02/28/23 150 lb (68 kg)  01/24/23 151 lb (68.5 kg)    General: Appears his stated age, well developed, well nourished in NAD. Skin: Warm, dry and intact. No ulcerations noted. HEENT: Head: normal shape and size; Eyes: sclera white, no icterus, conjunctiva pink, PERRLA and EOMs intact;  Neck:  Neck supple, trachea midline. No masses, lumps or thyromegaly present.  Cardiovascular: Normal rate and rhythm. S1,S2 noted.  No murmur, rubs or gallops noted. No JVD or BLE edema. Vascular changes noted of BLE. No carotid bruits noted. Pulmonary/Chest: Normal effort and positive vesicular breath sounds. No respiratory distress. No wheezes, rales or ronchi noted.  Abdomen: Soft and nontender. Normal bowel sounds.  Musculoskeletal: Strength 5/5 BUE/BLE. Gait slow and steady with use of cane.  Neurological: Alert and oriented. Cranial nerves II-XII grossly intact. Coordination normal.  Psychiatric: Mood and affect normal.  Behavior is normal. Judgment and thought content normal.     BMET    Component Value Date/Time   NA 140 04/22/2023 1313   NA 137 11/14/2020 1409   K 3.7 04/22/2023 1313   CL 102 04/22/2023 1313   CO2 27 04/22/2023 1313   GLUCOSE 116 04/22/2023 1313   BUN 16 04/22/2023 1313   BUN 15 11/14/2020 1409   CREATININE 0.64 (L) 04/22/2023 1313   CALCIUM 9.7 04/22/2023 1313   GFRNONAA 94 12/18/2017 1137   GFRAA 109 12/18/2017 1137    Lipid Panel     Component Value Date/Time   CHOL 148 04/22/2023 1313   CHOL 217 (H) 12/23/2015 1606   TRIG 131 04/22/2023 1313   HDL 41 04/22/2023 1313   HDL 29 (L) 12/23/2015 1606   CHOLHDL 3.6 04/22/2023 1313   LDLCALC 84 04/22/2023 1313    CBC    Component Value Date/Time   WBC 6.8 04/22/2023 1313   RBC 5.03 04/22/2023 1313   HGB 15.4 04/22/2023 1313   HGB 12.9 (L) 11/14/2020 1409   HCT 47.3 04/22/2023 1313   HCT 40.0 11/14/2020 1409   PLT 214 04/22/2023 1313   PLT 287 11/14/2020 1409   MCV 94.0 04/22/2023 1313   MCV 95 11/14/2020 1409   MCH 30.6 04/22/2023 1313   MCHC 32.6 04/22/2023 1313   RDW 13.4 04/22/2023 1313   RDW 14.4 11/14/2020 1409   LYMPHSABS 0.9 11/14/2020 1409   MONOABS 0.4 06/13/2017 1502   EOSABS 0.2 11/14/2020 1409   BASOSABS 0.0 11/14/2020 1409    Hgb A1C Lab Results  Component Value Date   HGBA1C 5.9 (H) 04/22/2023           Assessment & Plan:   Preventative health maintenance:  Flu shot declined He declines tetanus for financial reasons, advised him if he gets better cut to go get this done Encouraged him to get his COVID booster He declines pneumovax and prevnar Discussed Shingrix vaccine, he will check coverage with his insurance company and schedule visit if he would like to have this done He declines colonoscopy or cologuard at this time Encouraged him to consume  a balanced diet and exercise regimen Advised him to see an eye doctor and dentist annually We will check CBC, c-Met, lipid, A1c and  PSA today  RTC in 6 months, follow-up chronic conditions Nicki Reaper, NP

## 2023-07-10 DIAGNOSIS — N401 Enlarged prostate with lower urinary tract symptoms: Secondary | ICD-10-CM

## 2023-07-10 DIAGNOSIS — R972 Elevated prostate specific antigen [PSA]: Secondary | ICD-10-CM

## 2023-07-10 LAB — LIPID PANEL
Cholesterol: 155 mg/dL (ref <200)
HDL: 36 mg/dL — ABNORMAL LOW (ref >=40)
LDL Cholesterol (Calc): 97 mg/dL
Non-HDL Cholesterol (Calc): 119 mg/dL (ref <130)
Total CHOL/HDL Ratio: 4.3 (calc) (ref <5.0)
Triglycerides: 127 mg/dL (ref <150)

## 2023-07-10 LAB — COMPLETE METABOLIC PANEL WITH GFR
AG Ratio: 1.7 (calc) (ref 1.0–2.5)
ALT: 27 U/L (ref 9–46)
AST: 20 U/L (ref 10–35)
Albumin: 4.7 g/dL (ref 3.6–5.1)
Alkaline phosphatase (APISO): 110 U/L (ref 35–144)
BUN: 20 mg/dL (ref 7–25)
CO2: 28 mmol/L (ref 20–32)
Calcium: 9.9 mg/dL (ref 8.6–10.3)
Chloride: 100 mmol/L (ref 98–110)
Creat: 0.82 mg/dL (ref 0.70–1.35)
Globulin: 2.8 g/dL (ref 1.9–3.7)
Glucose, Bld: 94 mg/dL (ref 65–139)
Potassium: 4.3 mmol/L (ref 3.5–5.3)
Sodium: 138 mmol/L (ref 135–146)
Total Bilirubin: 0.6 mg/dL (ref 0.2–1.2)
Total Protein: 7.5 g/dL (ref 6.1–8.1)
eGFR: 97 mL/min/{1.73_m2} (ref >=60)

## 2023-07-10 LAB — CBC
HCT: 47.7 % (ref 38.5–50.0)
Hemoglobin: 15.6 g/dL (ref 13.2–17.1)
MCH: 31.1 pg (ref 27.0–33.0)
MCHC: 32.7 g/dL (ref 32.0–36.0)
MCV: 95 fL (ref 80.0–100.0)
MPV: 11.6 fL (ref 7.5–12.5)
Platelets: 236 10*3/uL (ref 140–400)
RBC: 5.02 10*6/uL (ref 4.20–5.80)
RDW: 14.1 % (ref 11.0–15.0)
WBC: 7.4 10*3/uL (ref 3.8–10.8)

## 2023-07-10 LAB — HEMOGLOBIN A1C
Hgb A1c MFr Bld: 6.3 %{Hb} — ABNORMAL HIGH (ref <5.7)
Mean Plasma Glucose: 134 mg/dL
eAG (mmol/L): 7.4 mmol/L

## 2023-07-10 LAB — PSA: PSA: 6.73 ng/mL — ABNORMAL HIGH (ref <=4.00)

## 2023-07-10 NOTE — Telephone Encounter (Signed)
Requests are too soon for refill.  Requested Prescriptions  Pending Prescriptions Disp Refills   JARDIANCE 10 MG TABS tablet [Pharmacy Med Name: Jardiance Oral Tablet 10 MG] 90 tablet 3    Sig: TAKE 1 TABLET EVERY DAY BEFORE BREAKFAST     Endocrinology:  Diabetes - SGLT2 Inhibitors Failed - 07/09/2023 10:45 AM      Failed - Cr in normal range and within 360 days    Creat  Date Value Ref Range Status  07/09/2023 0.82 0.70 - 1.35 mg/dL Final   Creatinine, Urine  Date Value Ref Range Status  04/22/2023 86 20 - 320 mg/dL Final         Passed - HBA1C is between 0 and 7.9 and within 180 days    Hgb A1c MFr Bld  Date Value Ref Range Status  07/09/2023 6.3 (H) <5.7 % of total Hgb Final    Comment:    For someone without known diabetes, a hemoglobin  A1c value between 5.7% and 6.4% is consistent with prediabetes and should be confirmed with a  follow-up test. . For someone with known diabetes, a value <7% indicates that their diabetes is well controlled. A1c targets should be individualized based on duration of diabetes, age, comorbid conditions, and other considerations. . This assay result is consistent with an increased risk of diabetes. . Currently, no consensus exists regarding use of hemoglobin A1c for diagnosis of diabetes for children. .          Passed - eGFR in normal range and within 360 days    GFR, Est African American  Date Value Ref Range Status  12/18/2017 109 > OR = 60 mL/min/1.13m2 Final   GFR, Est Non African American  Date Value Ref Range Status  12/18/2017 94 > OR = 60 mL/min/1.27m2 Final   eGFR  Date Value Ref Range Status  07/09/2023 97 > OR = 60 mL/min/1.18m2 Final  11/14/2020 105 >59 mL/min/1.73 Final         Passed - Valid encounter within last 6 months    Recent Outpatient Visits           Yesterday Encounter for general adult medical examination with abnormal findings   [place] [facility] [place], Salvadore [place], NP   1  month ago Impacted cerumen of left ear   [place] [facility] [place], [place] W, NP   2 months ago Type 2 diabetes mellitus with diabetic peripheral angiopathy without gangrene, without long-term current use of insulin [facility])   [place] [facility] [place], Salvadore Oxford, NP   3 months ago Chronic nausea   [place] [facility] [place], [place] W, NP   5 months ago Hyperlipidemia associated with type 2 diabetes mellitus [facility])   [place] [facility] [place], Salvadore Oxford, NP       Future Appointments             In 6 months Baity, Salvadore Oxford, NP [place] [facility]             gabapentin (NEURONTIN) 600 MG tablet [Pharmacy Med Name: Gabapentin Oral Tablet 600 MG] 270 tablet 3    Sig: TAKE 1 TABLET THREE TIMES DAILY     Neurology: Anticonvulsants - gabapentin Failed - 07/09/2023 10:45 AM      Failed - Cr in normal range and within 360 days    Creat  Date Value Ref Range Status  07/09/2023 0.82 0.70 - 1.35 mg/dL Final   Creatinine, Urine  Date Value Ref Range Status  04/22/2023 86 20 - 320 mg/dL Final         Passed - Completed PHQ-2 or PHQ-9 in the last 360 days      Passed - Valid encounter within last 12 months    Recent Outpatient Visits           Yesterday Encounter for general adult medical examination with abnormal findings   [place] [facility] [place], Salvadore Oxford, NP   1 month ago Impacted cerumen of left ear   [place] [facility] [place], [place] W, NP   2 months ago Type 2 diabetes mellitus with diabetic peripheral angiopathy without gangrene, without long-term current use of insulin [facility])   [place] [facility] [place], Salvadore Oxford, NP   3 months ago Chronic nausea   [place] [facility] [place], [place] W, NP   5 months ago Hyperlipidemia associated with type 2 diabetes mellitus [facility])    [facility] [place], Salvadore Oxford, NP       Future Appointments             In 6 months Baity, Salvadore Oxford, NP  [facility]

## 2023-07-16 NOTE — Telephone Encounter (Signed)
-----   Message from [facility] sent at 07/15/2023 11:56 AM EST ----- Attempted to call pt to discuss lab results. LVOM to call back for results.

## 2023-07-16 NOTE — Telephone Encounter (Signed)
Patient aware of results and recommendations.  Referral placed.

## 2023-07-27 ENCOUNTER — Other Ambulatory Visit: Payer: Self-pay | Admitting: Internal Medicine

## 2023-07-29 NOTE — Telephone Encounter (Signed)
Requested Prescriptions  Pending Prescriptions Disp Refills   gabapentin (NEURONTIN) 600 MG tablet [Pharmacy Med Name: Gabapentin Oral Tablet 600 MG] 270 tablet 3    Sig: TAKE 1 TABLET THREE TIMES DAILY     Neurology: Anticonvulsants - gabapentin Passed - 07/27/2023 11:01 AM      Passed - Cr in normal range and within 360 days    Creat  Date Value Ref Range Status  07/09/2023 0.82 0.70 - 1.35 mg/dL Final   Creatinine, Urine  Date Value Ref Range Status  04/22/2023 86 20 - 320 mg/dL Final         Passed - Completed PHQ-2 or PHQ-9 in the last 360 days      Passed - Valid encounter within last 12 months    Recent Outpatient Visits           2 weeks ago Encounter for general adult medical examination with abnormal findings   [place] [facility] [place], Salvadore Oxford, NP   2 months ago Impacted cerumen of left ear   [place] [facility] [place], [place] W, NP   3 months ago Type 2 diabetes mellitus with diabetic peripheral angiopathy without gangrene, without long-term current use of insulin [facility])   [place] [facility] [place], Salvadore Oxford, NP   4 months ago Chronic nausea   [place] [facility] [place], [place] W, NP   6 months ago Hyperlipidemia associated with type 2 diabetes mellitus [facility])   [place] [facility] [place], Salvadore Oxford, NP       Future Appointments             In 1 week Vanna Scotland, MD [facility] [place]   In 5 months Baity, Salvadore Oxford, NP [facility]             albuterol (VENTOLIN HFA) 108 (90 Base) MCG/ACT inhaler [Pharmacy Med Name: Albuterol Sulfate HFA Inhalation Aerosol Solution 108 (90 Base) MCG/ACT] 1 each 2    Sig: INHALE 2 PUFFS INTO THE LUNGS EVERY 6 HOURS AS NEEDED FOR WHEEZING OR SHORTNESS OF BREATH.     Pulmonology:  Beta Agonists 2 Passed - 07/27/2023 11:01 AM      Passed - Last BP in normal range    BP Readings from  Last 1 Encounters:  07/09/23 108/68         Passed - Last Heart Rate in normal range    Pulse Readings from Last 1 Encounters:  07/09/23 85         Passed - Valid encounter within last 12 months    Recent Outpatient Visits           2 weeks ago Encounter for general adult medical examination with abnormal findings   [place] [place], Salvadore Oxford, NP   2 months ago Impacted cerumen of left ear   [place] [facility] [place], [place] W, NP   3 months ago Type 2 diabetes mellitus with diabetic peripheral angiopathy without gangrene, without long-term current use of insulin [facility])   [place] [facility] [place], Salvadore Oxford, NP   4 months ago Chronic nausea   [place] [facility] [place], [place] W, NP   6 months ago Hyperlipidemia associated with type 2 diabetes mellitus [facility])   [place] [facility] [place], Salvadore [place], [place]  Future Appointments             In 1 week Vanna Scotland, MD [facility] [place]   In 5 months Baity, Salvadore Oxford, NP [place] [facility]            Refused Prescriptions Disp Refills   gabapentin (NEURONTIN) 300 MG capsule [Pharmacy Med Name: Gabapentin Oral Capsule 300 MG] 90 capsule 3    Sig: TAKE 1 CAPSULE (300 MG TOTAL) BY MOUTH AT BEDTIME. IN ADDITION TO 600 MG FOR A TOTAL OF 900 MG AT BEDTIME     Neurology: Anticonvulsants - gabapentin Passed - 07/27/2023 11:01 AM      Passed - Cr in normal range and within 360 days    Creat  Date Value Ref Range Status  07/09/2023 0.82 0.70 - 1.35 mg/dL Final   Creatinine, Urine  Date Value Ref Range Status  04/22/2023 86 20 - 320 mg/dL Final         Passed - Completed PHQ-2 or PHQ-9 in the last 360 days      Passed - Valid encounter within last 12 months    Recent Outpatient Visits           2 weeks ago Encounter for general adult medical examination with abnormal findings   [facility] [place], Salvadore Oxford, NP   2 months ago Impacted cerumen of left ear   [place] [facility] [place], [place], NP   3 months ago Type 2 diabetes mellitus with diabetic peripheral angiopathy without gangrene, without long-term current use of insulin [facility])   [place] [facility] [place], Salvadore Oxford, NP   4 months ago Chronic nausea   [place] [facility] [place], [place] W, NP   6 months ago Hyperlipidemia associated with type 2 diabetes mellitus [facility])   [place] [facility] [place], Salvadore Oxford, NP       Future Appointments             In 1 week Vanna Scotland, MD [facility] [place]   In 5 months Baity, Salvadore Oxford, NP [facility] [place] [facility]

## 2023-08-05 ENCOUNTER — Other Ambulatory Visit: Payer: Self-pay | Admitting: Internal Medicine

## 2023-08-05 NOTE — Telephone Encounter (Signed)
Requested medication (s) are due for refill today - yes  Requested medication (s) are on the active medication list -yes  Future visit scheduled -yes  Last refill: 03/12/23 180ea  1RF  Notes to clinic: off protocol- provider review   Requested Prescriptions  Pending Prescriptions Disp Refills   TRELEGY ELLIPTA 100-62.5-25 MCG/ACT AEPB [Pharmacy Med Name: Trelegy Ellipta Inhalation Aerosol Powder Breath Activated 100-62.5-25 MCG/ACT] 180 each 3    Sig: INHALE 1 PUFF INTO THE LUNGS DAILY     Off-Protocol Failed - 08/05/2023  1:57 AM      Failed - Medication not assigned to a protocol, review manually.      Passed - Valid encounter within last 12 months    Recent Outpatient Visits           3 weeks ago Encounter for general adult medical examination with abnormal findings   [place] [facility] [place], Salvadore Oxford, NP   2 months ago Impacted cerumen of left ear   [place], [place] W, NP   3 months ago Type 2 diabetes mellitus with diabetic peripheral angiopathy without gangrene, without long-term current use of insulin [facility])   [place] [facility] [place], Salvadore Oxford, NP   4 months ago Chronic nausea   [place] [facility] [place], [place], NP   6 months ago Hyperlipidemia associated with type 2 diabetes mellitus [facility])   [place] [facility] [place], Salvadore Oxford, NP       Future Appointments             In 4 days Vanna Scotland, MD [facility] [place]   In 5 months Baity, Salvadore Oxford, NP [place] [facility]               Requested Prescriptions  Pending Prescriptions Disp Refills   TRELEGY ELLIPTA 100-62.5-25 MCG/ACT AEPB [Pharmacy Med Name: Trelegy Ellipta Inhalation Aerosol Powder Breath Activated 100-62.5-25 MCG/ACT] 180 each 3    Sig: INHALE 1 PUFF INTO THE LUNGS DAILY     Off-Protocol Failed - 08/05/2023  1:57 AM      Failed -  Medication not assigned to a protocol, review manually.      Passed - Valid encounter within last 12 months    Recent Outpatient Visits           3 weeks ago Encounter for general adult medical examination with abnormal findings   [place] [facility] [place], Salvadore Oxford, NP   2 months ago Impacted cerumen of left ear   [place] [facility] [place], [place], NP   3 months ago Type 2 diabetes mellitus with diabetic peripheral angiopathy without gangrene, without long-term current use of insulin [facility])   [place] [facility] [place], Salvadore Oxford, NP   4 months ago Chronic nausea   [place] [facility] [place], [place], NP   6 months ago Hyperlipidemia associated with type 2 diabetes mellitus [facility])   [facility] [place], Salvadore Oxford, NP       Future Appointments             In 4 days Vanna Scotland, MD [facility] [place]   In 5 months Baity, Salvadore Oxford, NP  [facility]

## 2023-08-07 ENCOUNTER — Other Ambulatory Visit: Payer: Self-pay

## 2023-08-07 DIAGNOSIS — F17210 Nicotine dependence, cigarettes, uncomplicated: Secondary | ICD-10-CM

## 2023-08-07 DIAGNOSIS — Z87891 Personal history of nicotine dependence: Secondary | ICD-10-CM

## 2023-08-07 DIAGNOSIS — Z122 Encounter for screening for malignant neoplasm of respiratory organs: Secondary | ICD-10-CM

## 2023-08-08 ENCOUNTER — Other Ambulatory Visit: Payer: Self-pay

## 2023-08-08 DIAGNOSIS — N401 Enlarged prostate with lower urinary tract symptoms: Secondary | ICD-10-CM

## 2023-08-09 ENCOUNTER — Telehealth: Payer: Self-pay | Admitting: Internal Medicine

## 2023-08-09 ENCOUNTER — Ambulatory Visit: Payer: Medicare HMO | Admitting: Urology

## 2023-08-09 ENCOUNTER — Other Ambulatory Visit
Admission: RE | Admit: 2023-08-09 | Discharge: 2023-08-09 | Disposition: A | Discharge status: Home / Self Care | Payer: Medicare HMO | Attending: Urology | Admitting: Urology

## 2023-08-09 VITALS — BP 101/65 | HR 83 | Ht 74.0 in | Wt 163.0 lb

## 2023-08-09 DIAGNOSIS — R972 Elevated prostate specific antigen [PSA]: Secondary | ICD-10-CM | POA: Diagnosis not present

## 2023-08-09 DIAGNOSIS — N401 Enlarged prostate with lower urinary tract symptoms: Secondary | ICD-10-CM | POA: Diagnosis not present

## 2023-08-09 DIAGNOSIS — R350 Frequency of micturition: Secondary | ICD-10-CM | POA: Insufficient documentation

## 2023-08-09 LAB — URINALYSIS, COMPLETE (UACMP) WITH MICROSCOPIC
Bilirubin Urine: NEGATIVE
Glucose, UA: >=500 mg/dL — AB
Hgb urine dipstick: NEGATIVE
Ketones, ur: NEGATIVE mg/dL
Leukocytes,Ua: NEGATIVE
Nitrite: NEGATIVE
Protein, ur: NEGATIVE mg/dL
Specific Gravity, Urine: 1.02 (ref 1.005–1.030)
pH: 6 (ref 5.0–8.0)

## 2023-08-09 LAB — PSA: Prostatic Specific Antigen: 9.67 ng/mL — ABNORMAL HIGH (ref 0.00–4.00)

## 2023-08-09 LAB — BLADDER SCAN AMB NON-IMAGING: Scan Result: 70

## 2023-08-09 MED ORDER — TAMSULOSIN HCL 0.4 MG PO CAPS: 0.4000 mg | ORAL_CAPSULE | Freq: Every day | ORAL | 11 refills | Status: DC

## 2023-08-09 MED ORDER — FINASTERIDE 5 MG PO TABS: 5.0000 mg | ORAL_TABLET | Freq: Every day | ORAL | 11 refills | Status: DC

## 2023-08-09 NOTE — Progress Notes (Signed)
Marcelle Overlie Plume,acting as a scribe for Vanna Scotland, MD.,have documented all relevant documentation on the behalf of Vanna Scotland, MD,as directed by  Vanna Scotland, MD while in the presence of Vanna Scotland, MD.  08/09/23 4:02 PM   Ricardo Cervantes 26-Jun-1956 [id number]  Referring provider: Lorre Munroe, NP [address] [place],  [place] [address]  Chief Complaint  Patient presents with   Establish Care   Benign Prostatic Hypertrophy   Elevated PSA    HPI: 67 year-old male who presents today for further evaluation of urinary issues and elevated PSA.   He is not currently on any BPH medications. He has an enlarged prostate and has previously been advised to consider Flomax, but was not interested due to past negative experiences with similar medications.   His most recent PSA on 07/09/2023 was 6.73 up from 4.1 a year ago.   His urinalysis today is negative other than for  >500 glucose. His PVR is 50.   He does have erectile dysfunction on Cialis.He reports issues with maintaining erections and has tried generic Viagra with variable success. He is considering using a penile ring.   He is a diabetic on Jardiance. He does also have multiple medical comorbidities. He is on blood thinners and has an aortic aneurysm, which complicates potential biopsy procedures. He is hesitant about undergoing an MRI due to past negative experiences.   Today, he reports a weak stream and urgency frequency.  Results for orders placed or performed in visit on 08/09/23  Bladder Scan (Post Void Residual) in office  Result Value Ref Range   Scan Result 70 ml   Results for orders placed or performed during the hospital encounter of 08/09/23  Urinalysis, Complete w Microscopic -  Result Value Ref Range   Color, Urine YELLOW YELLOW   APPearance CLEAR CLEAR   Specific Gravity, Urine 1.020 1.005 - 1.030   pH 6.0 5.0 - 8.0   Glucose, UA >=500 (A) NEGATIVE mg/dL   Hgb urine dipstick NEGATIVE  NEGATIVE   Bilirubin Urine NEGATIVE NEGATIVE   Ketones, ur NEGATIVE NEGATIVE mg/dL   Protein, ur NEGATIVE NEGATIVE mg/dL   Nitrite NEGATIVE NEGATIVE   Leukocytes,Ua NEGATIVE NEGATIVE   Squamous Epithelial / HPF 0-5 0 - 5 /HPF   WBC, UA 0-5 0 - 5 WBC/hpf   RBC / HPF 0-5 0 - 5 RBC/hpf   Bacteria, UA RARE (A) NONE SEEN   Mucus PRESENT     IPSS     Row Name 08/09/23 1500         International Prostate Symptom Score   How often have you had the sensation of not emptying your bladder? Less than 1 in 5     How often have you had to urinate less than every two hours? More than half the time     How often have you found you stopped and started again several times when you urinated? More than half the time     How often have you found it difficult to postpone urination? More than half the time     How often have you had a weak urinary stream? More than half the time     How often have you had to strain to start urination? Less than 1 in 5 times     How many times did you typically get up at night to urinate? 2 Times     Total IPSS Score 20  Quality of Life due to urinary symptoms   If you were to spend the rest of your life with your urinary condition just the way it is now how would you feel about that? Mostly Disatisfied              Score:  1-7 Mild 8-19 Moderate 20-35 Severe    PMH: Past Medical History:  Diagnosis Date   Arthritis    Rheumatoid   Bipolar disorder (HCC)    Complication of anesthesia    difficulty keeping patient asleep. fentanyl makes patient mean, grumpy and difficult   Depression    Fusion of spine    GERD (gastroesophageal reflux disease)    slightly uncomfortable. takes otc antacids occasionally   Headache    deteriorating discs causing headaches. uses advil   Hepatitis C    HEP "C". treated 2 years ago   History of kidney stones 2016   Hyperlipidemia    Pericarditis    20 years ago   Peripheral vascular disease (HCC)    Smoker      Surgical History: Past Surgical History:  Procedure Laterality Date   ABDOMINAL AORTOGRAM W/LOWER EXTREMITY N/A 01/15/2017   Procedure: Abdominal Aortogram w/Lower Extremity;  Surgeon: Renford Dills, MD;  Location: [facility] INVASIVE CV LAB;  Service: Cardiovascular;  Laterality: N/A;   BACK SURGERY  2004   acdf, titanium c5-6   FEMORAL-POPLITEAL BYPASS GRAFT Right 06/19/2017   Procedure: BYPASS GRAFT FEMORAL-POPLITEAL ARTERY;  Surgeon: Renford Dills, MD;  Location: [facility] ORS;  Service: Vascular;  Laterality: Right;   LOWER EXTREMITY ANGIOGRAPHY Right 01/15/2017   Procedure: Lower Extremity Angiography;  Surgeon: Renford Dills, MD;  Location: [facility] INVASIVE CV LAB;  Service: Cardiovascular;  Laterality: Right;   LOWER EXTREMITY ANGIOGRAPHY Right 02/13/2017   Procedure: Lower Extremity Angiography;  Surgeon: Renford Dills, MD;  Location: [facility] INVASIVE CV LAB;  Service: Cardiovascular;  Laterality: Right;    Home Medications:  Allergies as of 08/09/2023       Reactions   Chantix [varenicline] Other (See Comments)   Depression        Medication List        Accurate as of August 09, 2023  4:02 PM. If you have any questions, ask your nurse or doctor.          STOP taking these medications    neomycin-polymyxin-hydrocortisone 3.5-10000-1 OTIC suspension Commonly known as: CORTISPORIN Stopped by: Vanna Scotland       TAKE these medications    albuterol 108 (90 Base) MCG/ACT inhaler Commonly known as: VENTOLIN HFA INHALE 2 PUFFS INTO THE LUNGS EVERY 6 HOURS AS NEEDED FOR WHEEZING OR SHORTNESS OF BREATH.   apixaban 5 MG Tabs tablet Commonly known as: Eliquis Take 1 tablet (5 mg total) by mouth 2 (two) times daily.   atorvastatin 80 MG tablet Commonly known as: LIPITOR TAKE 1 TABLET EVERY DAY   digoxin 0.25 MG tablet Commonly known as: LANOXIN TAKE 1 TABLET EVERY DAY   DULoxetine 60 MG capsule Commonly known as: CYMBALTA TAKE 1 CAPSULE EVERY DAY    empagliflozin 10 MG Tabs tablet Commonly known as: Jardiance Take 1 tablet (10 mg total) by mouth daily.   finasteride 5 MG tablet Commonly known as: PROSCAR Take 1 tablet (5 mg total) by mouth daily. Started by: Vanna Scotland   gabapentin 300 MG capsule Commonly known as: NEURONTIN Take 1 capsule (300 mg total) by mouth at bedtime. In addition to 600 mg for a total  of 900 mg QHS   gabapentin 600 MG tablet Commonly known as: NEURONTIN TAKE 1 TABLET THREE TIMES DAILY   metoprolol succinate 25 MG 24 hr tablet Commonly known as: TOPROL-XL Take 1 tablet (25 mg total) by mouth daily.   pantoprazole 40 MG tablet Commonly known as: PROTONIX Take 1 tablet (40 mg total) by mouth 2 (two) times daily.   promethazine 12.5 MG tablet Commonly known as: PHENERGAN TAKE 1 TABLET BY MOUTH EVERY 8 HOURS AS NEEDED   sildenafil 50 MG tablet Commonly known as: VIAGRA TAKE 1 TABLET EVERY DAY AS NEEDED FOR ERECTILE DYSFUNCTION   spironolactone 25 MG tablet Commonly known as: ALDACTONE Take 25 mg by mouth 2 (two) times daily.   tamsulosin 0.4 MG Caps capsule Commonly known as: FLOMAX Take 1 capsule (0.4 mg total) by mouth daily. Started by: Vanna Scotland   Trelegy Ellipta 100-62.5-25 MCG/ACT Aepb Generic drug: Fluticasone-Umeclidin-Vilant INHALE 1 PUFF INTO THE LUNGS DAILY        Allergies:  Allergies  Allergen Reactions   Chantix [Varenicline] Other (See Comments)    Depression    Family History: Family History  Problem Relation Age of Onset   Cancer Mother        liver   Heart disease Father    Diabetes Father    Breast cancer Sister    Brain cancer Sister     Social History:  reports that he has been smoking cigarettes. He started smoking about 9 years ago. He has a 29.8 pack-year smoking history. He has never used smokeless tobacco. He reports that he does not currently use alcohol after a past usage of about 2.0 standard drinks of alcohol per week. He reports that  he does not use drugs.   Physical Exam: BP 101/65   Pulse 83   Ht 6\' 2"  (1.88 m)   Wt 163 lb (73.9 kg)   BMI 20.93 kg/m   Constitutional:  Alert and oriented, No acute distress. HEENT: [place] AT, moist mucus membranes.  Trachea midline, no masses. GU: Approximately 75-80 gram prostate, rubbery, with no knots or nodules.  Rectal: Presence of external hemorrhoids Neurologic: Grossly intact, no focal deficits, moving all 4 extremities. Psychiatric: Normal mood and affect.   Assessment & Plan:    1. BPH - Initiate treatment with Flomax and finasteride.  - Discussed the potential benefits of combination therapy for symptom relief.  - Monitor for side effects and efficacy.  2. Elevated PSA - Repeat PSA test today and again in six months.  - Consider MRI of the prostate if PSA continues to rise or if there are concerning changes on examination.  - Discussed the risks and benefits of further diagnostic procedures, including biopsy, given the his comorbidities and current anticoagulation therapy.  3. Erectile dysfunction - Educate the patient on the use of a penile ring to maintain erections.  - Discussed the importance of taking sildenafil on an empty stomach for better absorption.  Return in about 6 months (around 02/07/2024) for repeat PSA,IPSS, and PVR.  I have reviewed the above documentation for accuracy and completeness, and I agree with the above.   Vanna Scotland, MD    [facility] [address] [place], [place] [address] [phone]

## 2023-08-09 NOTE — Telephone Encounter (Signed)
Medication Refill -  Most Recent Primary Care Visit:  Provider: Lorre Munroe  Department: [facility]  Visit Type: OFFICE VISIT  Date: 07/09/2023  Medication: varenicline (CHANTIX) 0.5 MG tablet [[id number]   Has the patient contacted their pharmacy? No (Pt states it has been a long time since he had this medication.  Is this the correct pharmacy for this prescription? Yes If no, delete pharmacy and type the correct one.  This is the patient's preferred pharmacy: [facility] [place], [place] - [address]   Has the prescription been filled recently? No  Is the patient out of the medication? Yes  Has the patient been seen for an appointment in the last year OR does the patient have an upcoming appointment? Yes  Can we respond through MyChart? Yes  Agent: Please be advised that Rx refills may take up to 3 business days. We ask that you follow-up with your pharmacy.

## 2023-08-12 MED ORDER — VARENICLINE TARTRATE (STARTER) 0.5 MG X 11 & 1 MG X 42 PO TBPK: ORAL_TABLET | ORAL | 0 refills | Status: DC

## 2023-08-12 NOTE — Addendum Note (Signed)
Addended by: Lorre Munroe on: 08/12/2023 07:38 AM   Modules accepted: Orders

## 2023-08-12 NOTE — Telephone Encounter (Signed)
Chantix starter pack sent to pharmacy

## 2023-08-14 ENCOUNTER — Encounter: Payer: Self-pay | Admitting: Adult Health

## 2023-08-14 ENCOUNTER — Ambulatory Visit: Payer: Medicare HMO | Admitting: Adult Health

## 2023-08-14 DIAGNOSIS — F17210 Nicotine dependence, cigarettes, uncomplicated: Secondary | ICD-10-CM | POA: Diagnosis not present

## 2023-08-14 NOTE — Progress Notes (Signed)
  Virtual Visit via Telephone Note  I connected with [place]. , 08/14/23 11:09 AM by a telemedicine application and verified that I am speaking with the correct person using two identifiers.  Location: Patient: home Provider: home   I discussed the limitations of evaluation and management by telemedicine and the availability of in person appointments. The patient expressed understanding and agreed to proceed.   Shared Decision Making Visit Lung Cancer Screening Program [phone])   Eligibility: 67 y.o. Pack Years Smoking History Calculation = 150 pack years (# packs/per year x # years smoked) Recent History of coughing up blood  no Unexplained weight loss? no ( >Than 15 pounds within the last 6 months ) Prior History Lung / other cancer no (Diagnosis within the last 5 years already requiring surveillance chest CT Scans). Smoking Status Current Smoker  Visit Components: Discussion included one or more decision making aids. YES Discussion included risk/benefits of screening. YES Discussion included potential follow up diagnostic testing for abnormal scans. YES Discussion included meaning and risk of over diagnosis. YES Discussion included meaning and risk of False Positives. YES Discussion included meaning of total radiation exposure. YES  Counseling Included: Importance of adherence to annual lung cancer LDCT screening. YES Impact of comorbidities on ability to participate in the program. YES Ability and willingness to under diagnostic treatment. YES  Smoking Cessation Counseling: Current Smokers:  Discussed importance of smoking cessation. yes Information about tobacco cessation classes and interventions provided to patient. yes Patient provided with "ticket" for LDCT Scan. yes Symptomatic Patient. no Diagnosis Code: Tobacco Use Z72.0 Asymptomatic Patient yes  Counseling (Intermediate counseling: > three minutes counseling) E3329  Z12.2-Screening of  respiratory organs Z87.891-Personal history of nicotine dependence   Ricardo Cervantes 08/14/23

## 2023-08-14 NOTE — Patient Instructions (Signed)

## 2023-08-16 ENCOUNTER — Other Ambulatory Visit: Payer: Self-pay | Admitting: Internal Medicine

## 2023-08-16 ENCOUNTER — Other Ambulatory Visit: Payer: Self-pay

## 2023-08-16 DIAGNOSIS — R972 Elevated prostate specific antigen [PSA]: Secondary | ICD-10-CM

## 2023-08-16 NOTE — Telephone Encounter (Signed)
Requested Prescriptions  Pending Prescriptions Disp Refills   metoprolol succinate (TOPROL-XL) 25 MG 24 hr tablet [Pharmacy Med Name: Metoprolol Succinate ER Oral Tablet Extended Release 24 Hour 25 MG] 90 tablet 1    Sig: TAKE 1 TABLET EVERY DAY     Cardiovascular:  Beta Blockers Passed - 08/16/2023  4:42 PM      Passed - Last BP in normal range    BP Readings from Last 1 Encounters:  08/09/23 101/65         Passed - Last Heart Rate in normal range    Pulse Readings from Last 1 Encounters:  08/09/23 83         Passed - Valid encounter within last 6 months    Recent Outpatient Visits           1 month ago Encounter for general adult medical examination with abnormal findings   [facility] [place], Salvadore Oxford, NP   2 months ago Impacted cerumen of left ear   [place] [facility] [place], [place] W, NP   4 months ago Type 2 diabetes mellitus with diabetic peripheral angiopathy without gangrene, without long-term current use of insulin [facility])   [facility] [place], Salvadore Oxford, NP   4 months ago Chronic nausea   [place] [facility] [place], [place] W, NP   6 months ago Hyperlipidemia associated with type 2 diabetes mellitus [facility])   [place] [facility] [place], Salvadore Oxford, NP       Future Appointments             In 4 months Baity, Salvadore Oxford, NP [facility]   In 5 months Vanna Scotland, MD [facility] [place]

## 2023-08-20 ENCOUNTER — Other Ambulatory Visit: Payer: Self-pay | Admitting: Internal Medicine

## 2023-08-20 NOTE — Telephone Encounter (Signed)
Requested Prescriptions  Refused Prescriptions Disp Refills   gabapentin (NEURONTIN) 300 MG capsule [Pharmacy Med Name: Gabapentin Oral Capsule 300 MG] 90 capsule 3    Sig: TAKE 1 CAPSULE (300 MG TOTAL) BY MOUTH AT BEDTIME. IN ADDITION TO 600 MG FOR A TOTAL OF 900 MG AT BEDTIME     Neurology: Anticonvulsants - gabapentin Passed - 08/20/2023  5:01 PM      Passed - Cr in normal range and within 360 days    Creat  Date Value Ref Range Status  07/09/2023 0.82 0.70 - 1.35 mg/dL Final   Creatinine, Urine  Date Value Ref Range Status  04/22/2023 86 20 - 320 mg/dL Final         Passed - Completed PHQ-2 or PHQ-9 in the last 360 days      Passed - Valid encounter within last 12 months    Recent Outpatient Visits           1 month ago Encounter for general adult medical examination with abnormal findings   [place] [facility] [place], Salvadore Oxford, NP   2 months ago Impacted cerumen of left ear   [place] [facility] [place], [place] W, NP   4 months ago Type 2 diabetes mellitus with diabetic peripheral angiopathy without gangrene, without long-term current use of insulin [facility])   [place] [facility] [place], Salvadore Oxford, NP   5 months ago Chronic nausea   [place] [facility] [place], [place] W, NP   6 months ago Hyperlipidemia associated with type 2 diabetes mellitus [facility])   [place] [facility] [place], Salvadore Oxford, NP       Future Appointments             In 4 months Baity, Salvadore Oxford, NP [place] [facility], [facility]   In 5 months Vanna Scotland, MD [facility] [place]

## 2023-08-22 ENCOUNTER — Ambulatory Visit: Payer: Medicare HMO

## 2023-08-22 DIAGNOSIS — Z9889 Other specified postprocedural states: Secondary | ICD-10-CM | POA: Diagnosis not present

## 2023-08-22 DIAGNOSIS — Z79899 Other long term (current) drug therapy: Secondary | ICD-10-CM | POA: Diagnosis not present

## 2023-08-22 DIAGNOSIS — I739 Peripheral vascular disease, unspecified: Secondary | ICD-10-CM | POA: Diagnosis not present

## 2023-08-22 DIAGNOSIS — F17210 Nicotine dependence, cigarettes, uncomplicated: Secondary | ICD-10-CM | POA: Diagnosis not present

## 2023-08-22 DIAGNOSIS — I714 Abdominal aortic aneurysm, without rupture, unspecified: Secondary | ICD-10-CM | POA: Diagnosis not present

## 2023-08-26 ENCOUNTER — Ambulatory Visit
Admission: RE | Admit: 2023-08-26 | Discharge: 2023-08-26 | Disposition: A | Discharge status: Home / Self Care | Payer: Medicare HMO | Source: Ambulatory Visit | Attending: Acute Care | Admitting: Acute Care

## 2023-08-26 DIAGNOSIS — F17210 Nicotine dependence, cigarettes, uncomplicated: Secondary | ICD-10-CM | POA: Diagnosis not present

## 2023-08-26 DIAGNOSIS — Z87891 Personal history of nicotine dependence: Secondary | ICD-10-CM | POA: Diagnosis not present

## 2023-08-26 DIAGNOSIS — Z122 Encounter for screening for malignant neoplasm of respiratory organs: Secondary | ICD-10-CM | POA: Diagnosis not present

## 2023-09-02 ENCOUNTER — Other Ambulatory Visit: Payer: Self-pay | Admitting: Internal Medicine

## 2023-09-02 ENCOUNTER — Ambulatory Visit
Admission: RE | Admit: 2023-09-02 | Discharge: 2023-09-02 | Disposition: A | Discharge status: Home / Self Care | Payer: Medicare HMO | Source: Ambulatory Visit | Attending: Urology | Admitting: Urology

## 2023-09-02 DIAGNOSIS — R972 Elevated prostate specific antigen [PSA]: Secondary | ICD-10-CM | POA: Insufficient documentation

## 2023-09-02 DIAGNOSIS — N4289 Other specified disorders of prostate: Secondary | ICD-10-CM | POA: Diagnosis not present

## 2023-09-02 MED ORDER — GADOBUTROL 1 MMOL/ML IV SOLN
7.0000 mL | Freq: Once | INTRAVENOUS | Status: AC | PRN
  Administered 2023-09-02: 7 mL via INTRAVENOUS

## 2023-09-04 ENCOUNTER — Other Ambulatory Visit: Payer: Self-pay | Admitting: Internal Medicine

## 2023-09-05 NOTE — Telephone Encounter (Signed)
 Requested Prescriptions  Pending Prescriptions Disp Refills   atorvastatin  (LIPITOR ) 80 MG tablet [Pharmacy Med Name: Atorvastatin  Calcium  Oral Tablet 80 MG] 90 tablet 3    Sig: TAKE 1 TABLET EVERY DAY     Cardiovascular:  Antilipid - Statins Failed - 09/05/2023  3:47 PM      Failed - Lipid Panel in normal range within the last 12 months    Cholesterol, Total  Date Value Ref Range Status  12/23/2015 217 (H) 100 - 199 mg/dL Final   Cholesterol  Date Value Ref Range Status  07/09/2023 155 <200 mg/dL Final   LDL Cholesterol (Calc)  Date Value Ref Range Status  07/09/2023 97 mg/dL (calc) Final    Comment:    Reference range: <100 . Desirable range <100 mg/dL for primary prevention;   <70 mg/dL for patients with CHD or diabetic patients  with > or = 2 CHD risk factors. SABRA LDL-C is now calculated using the Martin-Hopkins  calculation, which is a validated novel method providing  better accuracy than the Friedewald equation in the  estimation of LDL-C.  Gladis APPLETHWAITE et al. SANDREA. 7986;689(80): 2061-2068  (http://education.[employer].com/faq/FAQ164)    HDL  Date Value Ref Range Status  07/09/2023 36 (L) > OR = 40 mg/dL Final  95/78/7982 29 (L) >39 mg/dL Final   Triglycerides  Date Value Ref Range Status  07/09/2023 127 <150 mg/dL Final         Passed - Patient is not pregnant      Passed - Valid encounter within last 12 months    Recent Outpatient Visits           1 month ago Encounter for general adult medical examination with abnormal findings   [place] [facility] [place], Angeline ORN, NP   3 months ago Impacted cerumen of left ear   [place] [facility] [place], [place] W, NP   4 months ago Type 2 diabetes mellitus with diabetic peripheral angiopathy without gangrene, without long-term current use of insulin  [facility])   [place] [facility] [place], Angeline ORN, NP   5 months ago Chronic nausea   [place] [facility] [place], [place] W, NP   7 months ago Hyperlipidemia associated with type 2 diabetes mellitus [facility])   [place] [facility] [place], Angeline ORN, NP       Future Appointments             In 4 months Baity, Angeline ORN, NP [place] [facility], [facility]   In 5 months Penne Knee, MD [facility] [place]

## 2023-09-07 NOTE — Telephone Encounter (Signed)
 Requested medication (s) are due for refill today: yes  Requested medication (s) are on the active medication list: yes  Last refill:  08/12/23 #53 tabs  Future visit scheduled: yes  Notes to clinic:  notation from pharmacy not to reorder started pack. Will need new order with new SIG?   Requested Prescriptions  Pending Prescriptions Disp Refills   varenicline  (CHANTIX ) 1 MG tablet [Pharmacy Med Name: Varenicline  Tartrate Oral Tablet 1 MG] 53 tablet     Sig: TAKE 1/2 TABLET DAILY FOR 3 DAYS, 1/2 TABLET TWICE DAILY FOR 4 DAYS, THEN TAKE 1 TABLET TWICE DAILY     Psychiatry:  Drug Dependence Therapy - varenicline  Failed - 09/07/2023  1:21 PM      Failed - Manual Review: Do not refill starter pack. 1 mg tabs may be extended up to one year if the patient has quit smoking but still feels at risk for relapse.      Passed - Cr in normal range and within 180 days    Creat  Date Value Ref Range Status  07/09/2023 0.82 0.70 - 1.35 mg/dL Final   Creatinine, Urine  Date Value Ref Range Status  04/22/2023 86 20 - 320 mg/dL Final         Passed - Completed PHQ-2 or PHQ-9 in the last 360 days      Passed - Valid encounter within last 6 months    Recent Outpatient Visits           2 months ago Encounter for general adult medical examination with abnormal findings   [place] [facility] [place], Angeline ORN, NP   3 months ago Impacted cerumen of left ear   [place] [facility] [place], [place] W, NP   4 months ago Type 2 diabetes mellitus with diabetic peripheral angiopathy without gangrene, without long-term current use of insulin  [facility])   [place] [facility] [place], Angeline ORN, NP   5 months ago Chronic nausea   [facility] [place], [place] W, NP   7 months ago Hyperlipidemia associated with type 2 diabetes mellitus [facility])   [place] [facility] [place], Angeline ORN, NP       Future Appointments              In 4 months Baity, Angeline ORN, NP [place] [facility], [facility]   In 5 months Penne Knee, MD [facility] [place]

## 2023-09-12 ENCOUNTER — Other Ambulatory Visit: Payer: Self-pay | Admitting: Acute Care

## 2023-09-12 ENCOUNTER — Telehealth: Payer: Self-pay | Admitting: Acute Care

## 2023-09-12 DIAGNOSIS — R918 Other nonspecific abnormal finding of lung field: Secondary | ICD-10-CM

## 2023-09-12 NOTE — Telephone Encounter (Signed)
 I have called the patient with the results of his low dose CT Chest. I explained that his scan was read as a LR 4 B. There is a Cavitary posteromedial left upper lobe solid lung mass measuring 35.2 mm in volume derived mean diameter (series 3/image 91). Several additional smaller scattered bilateral pulmonary nodules, for example 16.6 mm in the peripheral right upper lobe on series 3/image 91 and 18.2 mm in the posterior left upper lobe on series 3/image 70. Next step is for a PET scan and then follow up with Dr. Tamea in the [facility] office to review the results and determine next best steps in plan of care.  Pt. Is in agreement with this plan.  I have had Dr. Tamea review the scan , and she will see the patient in the office within a week of the PET scan getting done.  Evalene, please watch for the PET date . Thanks so much.  Karna Karna Rake and Kelly, the PCP is already aware of the plan. Thanks so much all.

## 2023-09-12 NOTE — Telephone Encounter (Signed)
 See other telephone note from 09/12/23

## 2023-09-12 NOTE — Telephone Encounter (Signed)
 IMPRESSION: 1. Lung-RADS 4B-S, suspicious. Additional imaging evaluation or consultation with Pulmonology or Thoracic Surgery recommended. 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 Several additional scattered bilateral solid pulmonary nodules. 2. Saccular 6.1 cm diameter aortic aneurysm of the descending thoracic aorta. Thoracic surgery consultation recommended. 3. One vessel coronary atherosclerosis. 4. Aortic Atherosclerosis (ICD10-I70.0) and Emphysema (ICD10-J43.9).

## 2023-09-12 NOTE — Telephone Encounter (Signed)
 Noted. Will schedule once PET is scheduled.

## 2023-09-12 NOTE — Telephone Encounter (Signed)
 Appt has been scheduled for 1/17 at 11:30am. The patient is aware.

## 2023-09-12 NOTE — Telephone Encounter (Signed)
Call Report  

## 2023-09-18 ENCOUNTER — Ambulatory Visit
Admission: RE | Admit: 2023-09-18 | Discharge: 2023-09-18 | Disposition: A | Discharge status: Home / Self Care | Payer: Medicare HMO | Source: Ambulatory Visit | Attending: Acute Care | Admitting: Acute Care

## 2023-09-18 ENCOUNTER — Other Ambulatory Visit: Payer: Self-pay | Admitting: Internal Medicine

## 2023-09-18 DIAGNOSIS — R918 Other nonspecific abnormal finding of lung field: Secondary | ICD-10-CM | POA: Diagnosis not present

## 2023-09-18 DIAGNOSIS — C3412 Malignant neoplasm of upper lobe, left bronchus or lung: Secondary | ICD-10-CM | POA: Diagnosis not present

## 2023-09-18 LAB — GLUCOSE, CAPILLARY: Glucose-Capillary: 113 mg/dL — ABNORMAL HIGH (ref 70–99)

## 2023-09-18 MED ORDER — FLUDEOXYGLUCOSE F - 18 (FDG) INJECTION
8.4000 | Freq: Once | INTRAVENOUS | Status: AC | PRN
  Administered 2023-09-18: 8.82 via INTRAVENOUS

## 2023-09-19 NOTE — Telephone Encounter (Signed)
Requested Prescriptions  Pending Prescriptions Disp Refills   gabapentin (NEURONTIN) 300 MG capsule [Pharmacy Med Name: Gabapentin Oral Capsule 300 MG] 90 capsule 0    Sig: TAKE 1 CAPSULE (300 MG TOTAL) BY MOUTH AT BEDTIME. IN ADDITION TO 600 MG FOR A TOTAL OF 900 MG AT BEDTIME     Neurology: Anticonvulsants - gabapentin Passed - 09/19/2023  8:17 AM      Passed - Cr in normal range and within 360 days    Creat  Date Value Ref Range Status  07/09/2023 0.82 0.70 - 1.35 mg/dL Final   Creatinine, Urine  Date Value Ref Range Status  04/22/2023 86 20 - 320 mg/dL Final         Passed - Completed PHQ-2 or PHQ-9 in the last 360 days      Passed - Valid encounter within last 12 months    Recent Outpatient Visits           2 months ago Encounter for general adult medical examination with abnormal findings   [place] [facility] [place], Salvadore Oxford, NP   3 months ago Impacted cerumen of left ear   [place] [facility] [place], [place] W, NP   5 months ago Type 2 diabetes mellitus with diabetic peripheral angiopathy without gangrene, without long-term current use of insulin [facility])   [place] [facility] [place], Salvadore Oxford, NP   6 months ago Chronic nausea   [place] [facility] [place], [place] W, NP   7 months ago Hyperlipidemia associated with type 2 diabetes mellitus [facility])   [place] [facility] [place], Salvadore Oxford, NP       Future Appointments             In 3 months Baity, Salvadore Oxford, NP [place] [facility], [facility]   In 4 months Vanna Scotland, MD [facility] [place]

## 2023-09-20 ENCOUNTER — Encounter: Payer: Self-pay | Admitting: Pulmonary Disease

## 2023-09-20 ENCOUNTER — Ambulatory Visit: Payer: Medicare HMO | Admitting: Pulmonary Disease

## 2023-09-20 ENCOUNTER — Telehealth: Payer: Self-pay | Admitting: Acute Care

## 2023-09-20 VITALS — BP 134/82 | HR 75 | Temp 97.6°F | Ht 74.0 in | Wt 165.4 lb

## 2023-09-20 DIAGNOSIS — I5020 Unspecified systolic (congestive) heart failure: Secondary | ICD-10-CM | POA: Diagnosis not present

## 2023-09-20 DIAGNOSIS — R918 Other nonspecific abnormal finding of lung field: Secondary | ICD-10-CM | POA: Diagnosis not present

## 2023-09-20 DIAGNOSIS — F17210 Nicotine dependence, cigarettes, uncomplicated: Secondary | ICD-10-CM | POA: Insufficient documentation

## 2023-09-20 DIAGNOSIS — I7123 Aneurysm of the descending thoracic aorta, without rupture: Secondary | ICD-10-CM | POA: Diagnosis not present

## 2023-09-20 DIAGNOSIS — J449 Chronic obstructive pulmonary disease, unspecified: Secondary | ICD-10-CM

## 2023-09-20 DIAGNOSIS — A310 Pulmonary mycobacterial infection: Secondary | ICD-10-CM

## 2023-09-20 MED ORDER — LEVOFLOXACIN 750 MG PO TABS: 750.0000 mg | ORAL_TABLET | Freq: Every day | ORAL | 0 refills | Status: AC

## 2023-09-20 NOTE — Telephone Encounter (Signed)
I spoke with Dr. Reece Agar about this patient . 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 She is going to follow him independently, and is pulling him from the screening program. Please remove patient from screening program. Let his PCP know Dr. Jayme Cloud will be following this patient moving forward. Thanks so much

## 2023-09-20 NOTE — Progress Notes (Signed)
Subjective:    Patient ID: Ricardo Haven., male    DOB: 02/27/1956, 68 y.o.   MRN: [id number]  Patient Care Team: Lorre Munroe, NP as PCP - General (Internal Medicine)  Chief Complaint  Patient presents with   Consult    Cough only when in bed. Shortness of breath on exertion. Occasional wheezing.      BACKGROUND:68 year old current smoker presents for evaluation of abnormalities noted on lung cancer screening CT.  Patient has a history of ischemic cardiomyopathy, stage III COPD, prior TB treated, atypical Mycobacterium infection and severe peripheral vascular disease.  He is referred by the lung cancer screening program.  His primary care practitioner is Nicki Reaper, NP.   HPI Discussed the use of AI scribe software for clinical note transcription with the patient, who gave verbal consent to proceed.  History of Present Illness   Ricardo Cervantes, with a complex medical history including pulmonary nodules, emphysema, and aortic aneurysms, presents for evaluation of abnormal findings on a recent lung cancer screening CT scan. 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 The patient reports occasional productive cough but denies recent fever, chills, or weight loss.  The patient has a long-standing history of smoking, currently reduced to four cigarettes per day from a pack a day. The patient worked as a Psychologist, occupational, a profession known for its potential lung health risks. The patient also served in the [facility] [facility] [employer] for four years, he is not enrolled in the [place] health system.  The patient has a history of multiple aneurysms, including one in the descending thoracic aorta measuring 6.1 cm, which has not been regularly monitored. The patient also has a history of coronary artery disease, and peripheral vascular disease previous bypass surgeries performed on the  legs. The patient is currently on a regimen of 21 medications, including Trelegy for COPD, which he reports as helpful.  The patient has a history of GERD, which causes a cough that does not typically produce sputum. The patient reports no known allergies, including to antibiotics. The patient's son is heavily involved in his care and has been instrumental in the patient's smoking reduction efforts.     Patient has had an extensive history of life abroad particularly in [place], [place] and [place].  He was diagnosed with TB while in [place] in 2021 and underwent therapy subsequently QuantiFERON gold was negative in 2022.  The patient had bronchoscopy and endobronchial ultrasound with TBNA in January 2022 at at [facility].  Biopsies of the right upper lobe lesion were negative and lymph node biopsy was insufficient for determination.  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  The patient does have a history of ischemic cardiomyopathy most recent echocardiogram in October 2024 at [facility] showed that he had a LVEF of 35%, mild increased RV size and decreased function of the RV.  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  As noted the patient also has thoracic and abdominal aortic aneurysms.  The thoracic aneurysm is now measured at 6.1 cm previously in June 2023 this was 5.4 cm.  Son, Marianna Fuss is a surgical ICU nurse and is involved in his care.   Review of Systems A 10 point review of systems was performed and it is as noted above otherwise negative.  Past Medical History:  Diagnosis Date   Arthritis    Rheumatoid   Bipolar disorder (HCC)    Complication of anesthesia    difficulty keeping patient asleep. fentanyl makes patient mean, grumpy and difficult   Depression    Fusion of spine    GERD (gastroesophageal reflux disease)    slightly uncomfortable. takes otc antacids occasionally    Headache    deteriorating discs causing headaches. uses advil   Hepatitis C    HEP "C". treated 2 years ago   History of kidney stones 2016   Hyperlipidemia    Pericarditis    20 years ago   Peripheral vascular disease (HCC)    Smoker     Past Surgical History:  Procedure Laterality Date   ABDOMINAL AORTOGRAM W/LOWER EXTREMITY N/A 01/15/2017   Procedure: Abdominal Aortogram w/Lower Extremity;  Surgeon: Renford Dills, MD;  Location: [facility] INVASIVE CV LAB;  Service: Cardiovascular;  Laterality: N/A;   BACK SURGERY  2004   acdf, titanium c5-6   FEMORAL-POPLITEAL BYPASS GRAFT Right 06/19/2017   Procedure: BYPASS GRAFT FEMORAL-POPLITEAL ARTERY;  Surgeon: Renford Dills, MD;  Location: [facility] ORS;  Service: Vascular;  Laterality: Right;   LOWER EXTREMITY ANGIOGRAPHY Right 01/15/2017   Procedure: Lower Extremity Angiography;  Surgeon: Renford Dills, MD;  Location: [facility] INVASIVE CV LAB;  Service: Cardiovascular;  Laterality: Right;   LOWER EXTREMITY ANGIOGRAPHY Right 02/13/2017   Procedure: Lower Extremity Angiography;  Surgeon: Renford Dills, MD;  Location: [facility] INVASIVE CV LAB;  Service: Cardiovascular;  Laterality: Right;    Patient Active Problem List   Diagnosis Date Noted   Multiple lung nodules on CT 09/20/2023   Aneurysm of descending thoracic aorta without rupture (HCC) 09/20/2023   Tobacco dependence due to cigarettes 09/20/2023   Depression 04/12/2023   Type 2 diabetes mellitus with diabetic peripheral angiopathy without gangrene, without long-term current use of insulin (HCC) 01/24/2023   Peripheral neuropathy 12/14/2021   BPH (benign prostatic hyperplasia) 12/14/2021   GERD (gastroesophageal reflux disease) 07/31/2021   Stage 3 severe COPD by GOLD classification (HCC) 10/18/2020   HFrEF (heart failure with reduced ejection fraction) (HCC) 10/18/2020   History of stroke with residual effects 10/18/2020   Atypical mycobacterial infection of lung (HCC) 10/18/2020    Atrial fibrillation (HCC) 09/04/2020   Hyperlipidemia associated with type 2 diabetes mellitus (HCC) 07/04/2017   Atherosclerosis of native arteries of extremity with intermittent claudication (HCC) 06/19/2017   Erectile dysfunction 12/23/2015    Family History  Problem Relation Age of Onset   Cancer Mother        liver   Heart disease Father    Diabetes Father    Breast cancer Sister    Brain cancer Sister     Social History   Tobacco Use   Smoking status: Every Day    Current packs/day: 3.00    Average packs/day: 3.0 packs/day for 10.0 years (30.1 ttl pk-yrs)    Types: Cigarettes    Start date: 09/03/2013   Smokeless tobacco: Never   Tobacco comments:    Smoking 4 cigarettes daily 09/20/2023  Substance Use Topics   Alcohol use: Not Currently    Alcohol/week: 2.0 standard drinks of alcohol    Types: 2 Shots of liquor per week    Comment: occasional (every 3-4 weeks)     Allergies  Allergen Reactions   Chantix [Varenicline] Other (See Comments)    Depression    Current Meds  Medication Sig   albuterol (VENTOLIN  HFA) 108 (90 Base) MCG/ACT inhaler INHALE 2 PUFFS INTO THE LUNGS EVERY 6 HOURS AS NEEDED FOR WHEEZING OR SHORTNESS OF BREATH.   apixaban (ELIQUIS) 5 MG TABS tablet Take 1 tablet (5 mg total) by mouth 2 (two) times daily.   atorvastatin (LIPITOR) 80 MG tablet TAKE 1 TABLET EVERY DAY   digoxin (LANOXIN) 0.25 MG tablet TAKE 1 TABLET EVERY DAY   DULoxetine (CYMBALTA) 60 MG capsule TAKE 1 CAPSULE EVERY DAY   empagliflozin (JARDIANCE) 10 MG TABS tablet Take 1 tablet (10 mg total) by mouth daily.   finasteride (PROSCAR) 5 MG tablet Take 1 tablet (5 mg total) by mouth daily.   Fluticasone-Umeclidin-Vilant (TRELEGY ELLIPTA) 100-62.5-25 MCG/ACT AEPB INHALE 1 PUFF INTO THE LUNGS DAILY   gabapentin (NEURONTIN) 300 MG capsule TAKE 1 CAPSULE (300 MG TOTAL) BY MOUTH AT BEDTIME. IN ADDITION TO 600 MG FOR A TOTAL OF 900 MG AT BEDTIME   gabapentin (NEURONTIN) 600 MG tablet  TAKE 1 TABLET THREE TIMES DAILY   levofloxacin (LEVAQUIN) 750 MG tablet Take 1 tablet (750 mg total) by mouth daily for 7 days.   metoprolol succinate (TOPROL-XL) 25 MG 24 hr tablet TAKE 1 TABLET EVERY DAY   pantoprazole (PROTONIX) 40 MG tablet Take 1 tablet (40 mg total) by mouth 2 (two) times daily.   promethazine (PHENERGAN) 12.5 MG tablet TAKE 1 TABLET BY MOUTH EVERY 8 HOURS AS NEEDED   sildenafil (VIAGRA) 50 MG tablet TAKE 1 TABLET EVERY DAY AS NEEDED FOR ERECTILE DYSFUNCTION   spironolactone (ALDACTONE) 25 MG tablet Take 25 mg by mouth 2 (two) times daily.   tamsulosin (FLOMAX) 0.4 MG CAPS capsule Take 1 capsule (0.4 mg total) by mouth daily.   varenicline (CHANTIX) 1 MG tablet TAKE 1/2 TABLET DAILY FOR 3 DAYS, 1/2 TABLET TWICE DAILY FOR 4 DAYS, THEN TAKE 1 TABLET TWICE DAILY   Varenicline Tartrate, Starter, (CHANTIX STARTING MONTH PAK) 0.5 MG X 11 & 1 MG X 42 TBPK Day 1-3 take 0.5mg  once daily with meal. Day 4-7 take 0.5mg  twice daily. Day 8+ take 1mg  twice a day. 1 month starter pack.    Immunization History  Administered Date(s) Administered   Influenza-Unspecified 09/16/2020   Moderna Sars-Covid-2 Vaccination 09/14/2020   PFIZER Comirnaty(Gray Top)Covid-19 Tri-Sucrose Vaccine 10/12/2020        Objective:     BP 134/82 (BP Location: Left Arm, Patient Position: Sitting, Cuff Size: Normal)   Pulse 75   Temp 97.6 F (36.4 C) (Temporal)   Ht 6\' 2"  (1.88 m)   Wt 165 lb 6.4 oz (75 kg)   SpO2 96%   BMI 21.24 kg/m   SpO2: 96 %  GENERAL: Thin, well-developed gentleman in no acute distress.  He ambulates with the assistance of a cane. HEAD: Normocephalic, atraumatic.  EYES: Pupils equal, round, reactive to light.  No scleral icterus.  MOUTH: Poor dentition, upper dentures, partial lower.  Oral mucosa moist.  No thrush NECK: Supple. No thyromegaly. Trachea midline. No JVD.  No adenopathy. PULMONARY: Good air entry bilaterally.  Coarse, otherwise, no adventitious  sounds. CARDIOVASCULAR: S1 and S2. Regular rate and rhythm.  No rubs, murmurs or gallops heard. ABDOMEN: Benign. MUSCULOSKELETAL: No joint deformity, no clubbing, no edema.  NEUROLOGIC: No overt focal deficit, gait supported by cane.  Speech is fluent. SKIN: Intact,warm,dry. PSYCH: Mood and behavior normal.  Representative images from CT performed 26 August 2023 showing very severe emphysema and lesions on the left upper lobe as noted as well as scarring on the  right:     Representative image from CT performed 26 August 2023 showing large descending thoracic aneurysm:   Representative image from PET/CT performed 18 September 2023, radiologist interpretation pending showing a level uptake on the left upper lobe process consistent with inflammation/infection, malignancy not excluded:   Assessment & Plan:     ICD-10-CM   1. Multiple lung nodules on CT  R91.8     2. Stage 3 severe COPD by GOLD classification (HCC)  J44.9     3. Aneurysm of descending thoracic aorta without rupture (HCC)  I71.23     4. HFrEF (heart failure with reduced ejection fraction) (HCC)  I50.20     5. Tobacco dependence due to cigarettes  F17.210      Meds ordered this encounter  Medications   levofloxacin (LEVAQUIN) 750 MG tablet    Sig: Take 1 tablet (750 mg total) by mouth daily for 7 days.    Dispense:  7 tablet    Refill:  0   Discussion:    Pulmonary Nodules Multiple pulmonary nodules identified on CT scan, with one showing activity on PET scan suggestive of infection or inflammation. Previous bronchoscopy and biopsy indicated possible MAC infection. 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 Discussed treating with antibiotics due to likely infection. Patient agreed to treatment despite reluctance ("I take too many pills"). - Prescribe Levaquin 750 mg daily x 7 days - Order follow-up CT scan in 2-3 months - Collect sputum sample for MAC testing if productive cough occurs - Patient will be  followed by pulmonary/vascular with regards to chest imaging, discontinue lung cancer screening program  Emphysema Chronic emphysema likely secondary to long-term smoking and carrier status for alpha-1 antitrypsin deficiency (phenotype MS). Patient reports shortness of breath and uses Trelegy and albuterol as needed. Emphasized importance of continuing current medications and monitoring lung function. - Continue Trelegy - Perform pulmonary function tests to establish a new baseline  Chronic Obstructive Pulmonary Disease (COPD) COPD managed with Trelegy and occasional albuterol. Patient reports shortness of breath exacerbated by smoking and physical activity. Discussed smoking cessation and its impact on COPD management. - Continue Trelegy 100, 1 puff daily - Use albuterol as needed - Encourage smoking cessation  Thoracic Aortic Aneurysm Thoracic aneurysm measuring 6.1 cm. Patient follows up with vascular specialist at [facility]. Concerns about regular monitoring. Discussed potential need for surgical intervention on aneurysms 5 cm or greater. Patient interested in second opinion from Dr. Gilda Crease. - Refer to Dr. Gilda Crease for second opinion and potential intervention - Hold off on ordering additional CT until specialist consultation  General Health Maintenance Patient reducing smoking from a pack a day to four cigarettes a day. No recent weight loss or fever. History of GERD. Discussed further smoking reduction and monitoring general health. - Encourage further smoking reduction and cessation - Monitor weight and general health  Follow-up - Schedule follow-up appointment in 6-8 weeks - Coordinate with [facility] for short-term antibiotic prescription - Send referral to Dr. Gilda Crease for thoracic aneurysm evaluation.     Advised if symptoms do not improve or worsen, to please contact office for sooner follow up or seek emergency care.    I spent 62 minutes of dedicated to the care of this  patient on the date of this encounter to include pre-visit review of records, face-to-face time with the patient discussing conditions above, post visit ordering of testing, clinical documentation with the electronic health record, making appropriate referrals as documented, and communicating necessary findings to members of  the patients care team.   Gailen Shelter, MD Advanced Bronchoscopy P[facility]    *This note was dictated using voice recognition software/Dragon.  Despite best efforts to proofread, errors can occur which can change the meaning. Any transcriptional errors that result from this process are unintentional and may not be fully corrected at the time of dictation.

## 2023-09-20 NOTE — Patient Instructions (Signed)
VISIT SUMMARY:  Mr. Ricardo Cervantes visited today to discuss abnormal findings on a recent lung cancer screening CT scan. He has a complex medical history, including pulmonary nodules, emphysema, and an enlarged aorta. We reviewed his current symptoms, smoking history, and previous treatments. We also discussed his ongoing management plan for these conditions.  YOUR PLAN:  -PULMONARY NODULES: Pulmonary nodules are small growths in the lungs that can be caused by infections, inflammation, or other conditions. Given the activity seen on the PET scan and your history, we will treat this with antibiotics. A follow-up CT scan will be done in 2-3 months, and if you have a productive cough, we will collect a sputum sample for further testing.  -EMPHYSEMA: Emphysema is a lung condition that causes shortness of breath due to damage to the air sacs in the lungs, often from smoking. Continue using Trelegy and albuterol as needed. We will perform pulmonary function tests to establish a new baseline for your lung function.  -CHRONIC OBSTRUCTIVE PULMONARY DISEASE (COPD): COPD is a chronic inflammatory lung disease that obstructs airflow from the lungs, often caused by long-term exposure to irritating gases or particulate matter, most commonly from cigarette smoke. Continue using Trelegy and albuterol as needed. We strongly encourage you to continue reducing your smoking and aim for cessation to help manage your COPD.  -THORACIC AORTIC ANEURYSM: A thoracic aortic aneurysm is an enlargement of the upper part of the aorta, the major blood vessel that feeds blood to the body. Given the size of your aneurysm, we will refer you to Dr. Everlena Cooper for a second opinion and potential intervention. We will hold off on additional CT scans until after your specialist consultation.  -GENERAL HEALTH MAINTENANCE: You have made significant progress in reducing your smoking, [place] entually quitting smoking. Monitor your weight and  general health, and keep an eye on any new symptoms.  INSTRUCTIONS:  Please schedule a follow-up appointment in 6-8 weeks. We will coordinate with [facility] for your short-term antibiotic prescription. A referral to Dr. Gilda Crease for your thoracic aneurysm evaluation will be sent.

## 2023-09-23 NOTE — Telephone Encounter (Signed)
Message sent to PCP.

## 2023-09-29 DIAGNOSIS — I714 Abdominal aortic aneurysm, without rupture, unspecified: Secondary | ICD-10-CM | POA: Insufficient documentation

## 2023-09-29 NOTE — Progress Notes (Unsigned)
MRN : [id number]  Ricardo Cervantes. is a 68 y.o. ([birth year]) male who presents with chief complaint of check circulation.  History of Present Illness:   The patient presents to the office for evaluation of an abdominal aortic aneurysm. The aneurysm was found incidentally by CT scan. Patient denies abdominal pain or unusual back pain, no other abdominal complaints.  No history of an abrupt onset of a painful toe associated with blue discoloration.     No family history of AAA.   Patient denies amaurosis fugax or TIA symptoms. There is no history of claudication or rest pain symptoms of the lower extremities.  The patient denies angina or shortness of breath.  PET scan dated 09/18/2023 shows a TAA that measures 5.7 cm and an AAA that measures 5.2 cm  No outpatient medications have been marked as taking for the 09/30/23 encounter (Appointment) with Gilda Crease, Latina Craver, MD.    Past Medical History:  Diagnosis Date   Arthritis    Rheumatoid   Bipolar disorder (HCC)    Complication of anesthesia    difficulty keeping patient asleep. fentanyl makes patient mean, grumpy and difficult   Depression    Fusion of spine    GERD (gastroesophageal reflux disease)    slightly uncomfortable. takes otc antacids occasionally   Headache    deteriorating discs causing headaches. uses advil   Hepatitis C    HEP "C". treated 2 years ago   History of kidney stones 2016   Hyperlipidemia    Pericarditis    20 years ago   Peripheral vascular disease (HCC)    Smoker     Past Surgical History:  Procedure Laterality Date   ABDOMINAL AORTOGRAM W/LOWER EXTREMITY N/A 01/15/2017   Procedure: Abdominal Aortogram w/Lower Extremity;  Surgeon: Renford Dills, MD;  Location: [facility];  Service: Cardiovascular;  Laterality: N/A;   BACK SURGERY  2004   acdf, titanium c5-6   FEMORAL-POPLITEAL BYPASS GRAFT Right 06/19/2017    Procedure: BYPASS GRAFT FEMORAL-POPLITEAL ARTERY;  Surgeon: Renford Dills, MD;  Location: [facility];  Service: Vascular;  Laterality: Right;   LOWER EXTREMITY ANGIOGRAPHY Right 01/15/2017   Procedure: Lower Extremity Angiography;  Surgeon: Renford Dills, MD;  Location: [facility];  Service: Cardiovascular;  Laterality: Right;   LOWER EXTREMITY ANGIOGRAPHY Right 02/13/2017   Procedure: Lower Extremity Angiography;  Surgeon: Renford Dills, MD;  Location: [facility];  Service: Cardiovascular;  Laterality: Right;    Social History Social History   Tobacco Use   Smoking status: Every Day    Current packs/day: 3.00    Average packs/day: 3.0 packs/day for 10.1 years (30.2 ttl pk-yrs)    Types: Cigarettes    Start date: 09/03/2013   Smokeless tobacco: Never   Tobacco comments:    Smoking 4 cigarettes daily 09/20/2023  Vaping Use   Vaping status: Former  Substance Use Topics   Alcohol use: Not Currently    Alcohol/week: 2.0 standard drinks of alcohol    Types: 2 Shots of liquor per week    Comment: occasional (  every 3-4 weeks)    Drug use: No    Comment: polysubstance approx 2 years ago    Family History Family History  Problem Relation Age of Onset   Cancer Mother        liver   Heart disease Father    Diabetes Father    Breast cancer Sister    Brain cancer Sister     Allergies  Allergen Reactions   Chantix [Varenicline] Other (See Comments)    Depression     REVIEW OF SYSTEMS (Negative unless checked)  Constitutional: [] Weight loss  [] Fever  [] Chills Cardiac: [] Chest pain   [] Chest pressure   [] Palpitations   [] Shortness of breath when laying flat   [] Shortness of breath with exertion. Vascular:  [x] Pain in legs with walking   [] Pain in legs at rest  [] History of DVT   [] Phlebitis   [] Swelling in legs   [] Varicose veins   [] Non-healing ulcers Pulmonary:   [] Uses home oxygen   [] Productive cough   [] Hemoptysis   [] Wheeze  [x] COPD    [] Asthma Neurologic:  [] Dizziness   [] Seizures   [] History of stroke   [] History of TIA  [] Aphasia   [] Vissual changes   [] Weakness or numbness in arm   [] Weakness or numbness in leg Musculoskeletal:   [] Joint swelling   [] Joint pain   [] Low back pain Hematologic:  [] Easy bruising  [] Easy bleeding   [] Hypercoagulable state   [] Anemic Gastrointestinal:  [] Diarrhea   [] Vomiting  [x] Gastroesophageal reflux/heartburn   [] Difficulty swallowing. Genitourinary:  [] Chronic kidney disease   [] Difficult urination  [] Frequent urination   [] Blood in urine Skin:  [] Rashes   [] Ulcers  Psychological:  [] History of anxiety   [x]  History of major depression.  Physical Examination  There were no vitals filed for this visit. There is no height or weight on file to calculate BMI. Gen: WD/WN, NAD Head: [place]/AT, No temporalis wasting.  Ear/Nose/Throat: Hearing grossly intact, nares w/o erythema or drainage Eyes: PER, EOMI, sclera nonicteric.  Neck: Supple, no masses.  No bruit or JVD.  Pulmonary:  Good air movement, no audible wheezing, no use of accessory muscles.  Cardiac: RRR, normal S1, S2, no Murmurs. Vascular:  mild trophic changes, no open wounds Vessel Right Left  Radial Palpable Palpable  PT Not Palpable Not Palpable  DP Not Palpable Not Palpable  Gastrointestinal: soft, non-distended. No guarding/no peritoneal signs.  Musculoskeletal: M/S 5/5 throughout.  No visible deformity.  Neurologic: CN 2-12 intact. Pain and light touch intact in extremities.  Symmetrical.  Speech is fluent. Motor exam as listed above. Psychiatric: Judgment intact, Mood & affect appropriate for pt's clinical situation. Dermatologic: No rashes or ulcers noted.  No changes consistent with cellulitis.   CBC Lab Results  Component Value Date   WBC 7.4 07/09/2023   HGB 15.6 07/09/2023   HCT 47.7 07/09/2023   MCV 95.0 07/09/2023   PLT 236 07/09/2023    BMET    Component Value Date/Time   NA 138 07/09/2023 1341   NA  137 11/14/2020 1409   K 4.3 07/09/2023 1341   CL 100 07/09/2023 1341   CO2 28 07/09/2023 1341   GLUCOSE 94 07/09/2023 1341   BUN 20 07/09/2023 1341   BUN 15 11/14/2020 1409   CREATININE 0.82 07/09/2023 1341   CALCIUM 9.9 07/09/2023 1341   GFRNONAA 94 12/18/2017 1137   GFRAA 109 12/18/2017 1137   CrCl cannot be calculated (Patient's most recent lab result is older than the maximum 21 days allowed.).  COAG Lab Results  Component Value Date   INR 1.10 06/13/2017    Radiology NM PET Image Initial (PI) Skull Base To Thigh Result Date: 09/26/2023 CLINICAL DATA:  Initial treatment strategy for cavitary left upper lobe lung mass and additional pulmonary nodules. EXAM: NUCLEAR MEDICINE PET SKULL BASE TO THIGH TECHNIQUE: 8.8 mCi F-18 FDG was injected intravenously. Full-ring PET imaging was performed from the skull base to thigh after the radiotracer. CT data was obtained and used for attenuation correction and anatomic localization. Fasting blood glucose: 113 mg/dl COMPARISON:  Chest CT 16/06/9603 FINDINGS: Mediastinal blood pool activity: SUV max 2.5 Liver activity: SUV max NA NECK: No significant abnormal hypermetabolic activity in this region. Incidental CT findings: Bilateral common carotid atheromatous vascular calcification. CHEST: The somewhat tubular shaped cavitary process extending posteriorly in the medial left upper lobe has a maximum SUV of 7.6, and extends about 9 cm vertically with components of internal cavitation. Bilobed nodule lateral to this measures 2.0 by 1.1 cm on image 49 series 6 with maximum SUV 2.5, this previously measured 2.6 by 1.1 cm The nodule posteriorly in the right upper lobe on image 53 series 6 measures 1.8 by 0.8 cm, maximum SUV 1.4. The bandlike nodularity further anteriorly in the right upper lobe has a nodular component measuring 1.1 by 1.4 cm on image 57 series 6, maximum SUV 2.1. No hypermetabolic adenopathy in the chest. Incidental CT findings: Coronary,  aortic arch, and branch vessel atherosclerotic vascular disease. Descending thoracic aortic aneurysm, better characterized on the diagnostic CT and described on that exam, without substantial change. Severe emphysema. ABDOMEN/PELVIS: No significant abnormal hypermetabolic activity in this region. Incidental CT findings: Atherosclerosis is present, including aortoiliac atherosclerotic disease. Fusiform infrarenal abdominal aortic aneurysm 5.1 cm in diameter on image 116 series 6, new from 11/23/2014 CT scan. Likely partially thrombosed aneurysm or pseudoaneurysm of the right common femoral artery with rim calcification and connectivity to a superficial vascular structure which may be venous, images 163170 of series 6. Mild sigmoid colon diverticulosis. SKELETON: No significant abnormal hypermetabolic activity in this region. Incidental CT findings: Mild levoconvex thoracic scoliosis. Lower cervical plate and screw fixator. IMPRESSION: 1. The somewhat tubular shaped cavitary process extending posteriorly in the medial left upper lobe has a maximum SUV of 7.6, and extends about 9 cm vertically with components of internal cavitation. Malignant etiology favored, atypical infection is a less likely differential diagnostic consideration. 2. The bilobed nodule in the left upper lobe has a maximum SUV of 2.5, borderline for malignancy. 3. The right upper lobe nodularity is below blood pool and probably benign but meriting surveillance. 4. No hypermetabolic adenopathy in the chest. 5. Fusiform infrarenal abdominal aortic aneurysm 5.1 cm in diameter, new from 11/23/2014 CT scan. There is also a descending thoracic aortic aneurysm at described on prior chest CT. Recommend referral to a vascular specialist. This recommendation follows ACR consensus guidelines: White Paper of the ACR Incidental Findings Committee II on Vascular Findings. J Am Coll Radiol 2013; 10:789-794. 6. Likely partially thrombosed aneurysm or pseudoaneurysm  of the right common femoral artery with rim calcification and connections to a superficial vascular structure which may be venous. 7. Coronary, aortic arch, and branch vessel atherosclerotic vascular disease. 8. Severe emphysema. 9. Mild sigmoid colon diverticulosis. 10. Mild levoconvex thoracic scoliosis. Aortic Atherosclerosis (ICD10-I70.0) and Emphysema (ICD10-J43.9). Electronically Signed   By: Gaylyn Rong M.D.   On: 09/26/2023 13:14   MR PROSTATE W WO CONTRAST Result Date: 09/03/2023 CLINICAL DATA:  Elevated PSA equal  64.9.  68 year old male. EXAM: MR PROSTATE WITHOUT AND WITH CONTRAST TECHNIQUE: Multiplanar multisequence MRI images were obtained of the pelvis centered about the prostate. Pre and post contrast images were obtained. CONTRAST:  7mL GADAVIST GADOBUTROL 1 MMOL/ML IV SOLN COMPARISON:  None Available. FINDINGS: Prostate: Normal high signal intensity in the peripheral zone on T2 weighted imaging (series 4). No foci of restricted diffusion within the peripheral zone. No suspicious postcontrast T1 weighted findings in the peripheral zone. The transitional zone is enlarged by capsulated nodules without suspicious imaging characteristics on T2 weighted imaging. Volume: 5.3 x 4.0 x 4.2 cm (volume = 47 cm^3) Transcapsular spread:  Absent Seminal vesicle involvement: Absent Neurovascular bundle involvement: Absent Pelvic adenopathy: Absent Bone metastasis: Absent Other findings: None IMPRESSION: 1. No high-grade carcinoma identified in the peripheral zone. PI-RADS (v2.1): 1. 2. Enlarged nodular transitional zone most consistent with benign prostate hypertrophy. PI-RADS(v2.1): 2 Electronically Signed   By: Genevive Bi M.D.   On: 09/03/2023 13:41     Assessment/Plan 1. Aneurysm of descending thoracic aorta without rupture (HCC) (Primary) Recommend:   The patient has an abdominal aortic aneurysm that is 5.0 cm or greater by duplex scan.  The patient is otherwise in reasonable health.    Therefore, the patient should undergo repair of the AAA to prevent future leathal rupture.   Patient will require CT angiography of the abdomen and pelvis in order to appropriately plan repair of the AAA.    The risks and benefits as well as the alternative therapies was discussed in detail with the patient. All questions were answered. The patient agrees to move forward the AAA repair.  Therefore, a CT angiogram with be scheduled as an outpatient.  The patient will follow up with me in the office after the CT scan to review the study and finalize plan for repair. - CT Angio Chest/Abd/Pel for Dissection W and/or W/WO; Future - Ambulatory referral to Cardiology  2. Infrarenal abdominal aortic aneurysm (AAA) without rupture (HCC) Recommend:   The patient has an abdominal aortic aneurysm that is 5.0 cm or greater by duplex scan.  The patient is otherwise in reasonable health.   Therefore, the patient should undergo repair of the AAA to prevent future leathal rupture.   Patient will require CT angiography of the abdomen and pelvis in order to appropriately plan repair of the AAA.    The risks and benefits as well as the alternative therapies was discussed in detail with the patient. All questions were answered. The patient agrees to move forward the AAA repair.  Therefore, a CT angiogram with be scheduled as an outpatient.  The patient will follow up with me in the office after the CT scan to review the study and finalize plan for repair. - CT Angio Chest/Abd/Pel for Dissection W and/or W/WO; Future  3. Atherosclerosis of native artery of both lower extremities with intermittent claudication (HCC)  Recommend:  The patient has evidence of atherosclerosis of the lower extremities with claudication.  The patient does not voice lifestyle limiting changes at this point in time.  Noninvasive studies do not suggest clinically significant change.  No invasive studies, angiography or surgery  at this time The patient should continue walking and begin a more formal exercise program.  The patient should continue antiplatelet therapy and aggressive treatment of the lipid abnormalities  No changes in the patient's medications at this time  Continued surveillance is indicated as atherosclerosis is likely to progress with time.    The patient  will continue follow up with noninvasive studies as ordered.   4. Chronic atrial fibrillation (HCC) Continue antiarrhythmia medications as already ordered, these medications have been reviewed and there are no changes at this time.  Continue anticoagulation as ordered by Cardiology Service - Ambulatory referral to Cardiology  5. Type 2 diabetes mellitus with diabetic peripheral angiopathy without gangrene, without long-term current use of insulin (HCC) Continue hypoglycemic medications as already ordered, these medications have been reviewed and there are no changes at this time.  Hgb A1C to be monitored as already arranged by primary service  6. Stage 3 severe COPD by GOLD classification (HCC) Continue pulmonary medications and aerosols as already ordered, these medications have been reviewed and there are no changes at this time.     Levora Dredge, MD  09/29/2023 1:37 PM

## 2023-09-30 ENCOUNTER — Ambulatory Visit (INDEPENDENT_AMBULATORY_CARE_PROVIDER_SITE_OTHER): Payer: Medicare HMO | Admitting: Vascular Surgery

## 2023-09-30 ENCOUNTER — Encounter (INDEPENDENT_AMBULATORY_CARE_PROVIDER_SITE_OTHER): Payer: Self-pay | Admitting: Vascular Surgery

## 2023-09-30 VITALS — BP 114/74 | HR 86 | Resp 18 | Ht 74.0 in | Wt 166.5 lb

## 2023-09-30 DIAGNOSIS — I482 Chronic atrial fibrillation, unspecified: Secondary | ICD-10-CM

## 2023-09-30 DIAGNOSIS — I70213 Atherosclerosis of native arteries of extremities with intermittent claudication, bilateral legs: Secondary | ICD-10-CM

## 2023-09-30 DIAGNOSIS — I7123 Aneurysm of the descending thoracic aorta, without rupture: Secondary | ICD-10-CM | POA: Diagnosis not present

## 2023-09-30 DIAGNOSIS — J449 Chronic obstructive pulmonary disease, unspecified: Secondary | ICD-10-CM | POA: Diagnosis not present

## 2023-09-30 DIAGNOSIS — E1151 Type 2 diabetes mellitus with diabetic peripheral angiopathy without gangrene: Secondary | ICD-10-CM

## 2023-09-30 DIAGNOSIS — I7143 Infrarenal abdominal aortic aneurysm, without rupture: Secondary | ICD-10-CM | POA: Diagnosis not present

## 2023-10-01 ENCOUNTER — Other Ambulatory Visit: Payer: Self-pay | Admitting: Internal Medicine

## 2023-10-01 ENCOUNTER — Encounter (INDEPENDENT_AMBULATORY_CARE_PROVIDER_SITE_OTHER): Payer: Self-pay | Admitting: Vascular Surgery

## 2023-10-01 NOTE — Telephone Encounter (Signed)
Requested medication (s) are due for refill today: routing for review  Requested medication (s) are on the active medication list: yes  Last refill:  09/09/23  Future visit scheduled: yes  Notes to clinic:  Manual Review: routing for review of sig.     Requested Prescriptions  Pending Prescriptions Disp Refills   varenicline (CHANTIX) 1 MG tablet [Pharmacy Med Name: Varenicline Tartrate Oral Tablet 1 MG] 53 tablet 11    Sig: TAKE 1/2 TABLET DAILY FOR 3 DAYS, 1/2 TABLET TWICE DAILY FOR 4 DAYS, THEN TAKE 1 TABLET TWICE DAILY     Psychiatry:  Drug Dependence Therapy - varenicline Failed - 10/01/2023  8:13 AM      Failed - Manual Review: Do not refill starter pack. 1 mg tabs may be extended up to one year if the patient has quit smoking but still feels at risk for relapse.      Passed - Cr in normal range and within 180 days    Creat  Date Value Ref Range Status  07/09/2023 0.82 0.70 - 1.35 mg/dL Final   Creatinine, Urine  Date Value Ref Range Status  04/22/2023 86 20 - 320 mg/dL Final         Passed - Completed PHQ-2 or PHQ-9 in the last 360 days      Passed - Valid encounter within last 6 months    Recent Outpatient Visits           2 months ago Encounter for general adult medical examination with abnormal findings   [place] [facility] [place], Salvadore [place], NP   4 months ago Impacted cerumen of left ear   [place] [facility] [place], [place] W, NP   5 months ago Type 2 diabetes mellitus with diabetic peripheral angiopathy without gangrene, without long-term current use of insulin [facility])   [place] [place], Salvadore Oxford, NP   6 months ago Chronic nausea   [place] [facility] [place], [place] W, NP   8 months ago Hyperlipidemia associated with type 2 diabetes mellitus [facility])   [place] [facility] [place], Salvadore Oxford, NP       Future Appointments             In 1 month Salena Saner, MD [facility] Pulmonary Care at [place]   In 3 months Baity, Salvadore Oxford, NP [place] [facility]   In 4 months Vanna Scotland, MD [facility] [place]

## 2023-10-09 ENCOUNTER — Telehealth (INDEPENDENT_AMBULATORY_CARE_PROVIDER_SITE_OTHER): Payer: Self-pay | Admitting: Vascular Surgery

## 2023-10-09 ENCOUNTER — Other Ambulatory Visit: Payer: Self-pay | Admitting: Internal Medicine

## 2023-10-09 NOTE — Telephone Encounter (Signed)
 LVM for pt advising that the CT ordered by Dr. Prescilla Brod has received a prior authorization. I left phone number of [phone] for pt to call and schedule the CT. I also asked for a return call to set up the follow up with Dr. Prescilla Brod for CT results.

## 2023-10-11 NOTE — Telephone Encounter (Signed)
 Requested Prescriptions  Refused Prescriptions Disp Refills   gabapentin  (NEURONTIN ) 300 MG capsule [Pharmacy Med Name: Gabapentin  Oral Capsule 300 MG] 90 capsule 3    Sig: TAKE 1 CAPSULE (300MG ) AT BEDTIME. IN ADDITION TO 600MG  FOR A TOTAL OF 900MG  AT BEDTIME     Neurology: Anticonvulsants - gabapentin  Passed - 10/11/2023  8:11 AM      Passed - Cr in normal range and within 360 days    Creat  Date Value Ref Range Status  07/09/2023 0.82 0.70 - 1.35 mg/dL Final   Creatinine, Urine  Date Value Ref Range Status  04/22/2023 86 20 - 320 mg/dL Final         Passed - Completed PHQ-2 or PHQ-9 in the last 360 days      Passed - Valid encounter within last 12 months    Recent Outpatient Visits           3 months ago Encounter for general adult medical examination with abnormal findings   [place] [facility] [place], Angeline ORN, NP   4 months ago Impacted cerumen of left ear   [place] [facility] [place], [place] W, NP   6 months ago Type 2 diabetes mellitus with diabetic peripheral angiopathy without gangrene, without long-term current use of insulin  [facility])   [place] [facility] [place], Angeline ORN, NP   6 months ago Chronic nausea   [place] [facility] [place], [place] W, NP   8 months ago Hyperlipidemia associated with type 2 diabetes mellitus [facility])   [place] [facility] [place], Angeline ORN, NP       Future Appointments             In 1 month Tamea Dedra CROME, MD [facility] Pulmonary Care at [place]   In 2 months Baity, Angeline ORN, NP [place] [facility], [facility]   In 3 months Penne Knee, MD [facility] [place]

## 2023-10-15 ENCOUNTER — Ambulatory Visit
Admission: RE | Admit: 2023-10-15 | Discharge: 2023-10-15 | Disposition: A | Discharge status: Home / Self Care | Payer: Medicare HMO | Source: Ambulatory Visit | Attending: Vascular Surgery | Admitting: Vascular Surgery

## 2023-10-15 DIAGNOSIS — I7143 Infrarenal abdominal aortic aneurysm, without rupture: Secondary | ICD-10-CM | POA: Insufficient documentation

## 2023-10-15 DIAGNOSIS — J439 Emphysema, unspecified: Secondary | ICD-10-CM | POA: Diagnosis not present

## 2023-10-15 DIAGNOSIS — Z9049 Acquired absence of other specified parts of digestive tract: Secondary | ICD-10-CM | POA: Diagnosis not present

## 2023-10-15 DIAGNOSIS — I700 Atherosclerosis of aorta: Secondary | ICD-10-CM | POA: Diagnosis not present

## 2023-10-15 DIAGNOSIS — K5730 Diverticulosis of large intestine without perforation or abscess without bleeding: Secondary | ICD-10-CM | POA: Diagnosis not present

## 2023-10-15 DIAGNOSIS — I7123 Aneurysm of the descending thoracic aorta, without rupture: Secondary | ICD-10-CM | POA: Diagnosis not present

## 2023-10-15 MED ORDER — IOHEXOL 350 MG/ML SOLN
100.0000 mL | Freq: Once | INTRAVENOUS | Status: AC | PRN
  Administered 2023-10-15: 100 mL via INTRAVENOUS

## 2023-10-19 ENCOUNTER — Other Ambulatory Visit: Payer: Self-pay | Admitting: Internal Medicine

## 2023-10-19 DIAGNOSIS — H60541 Acute eczematoid otitis externa, right ear: Secondary | ICD-10-CM

## 2023-10-21 NOTE — Telephone Encounter (Signed)
 Next visit scheduled- 01/06/24- patient will need 1 refill before visit- will go ahead and refill- although a little eraly Requested Prescriptions  Pending Prescriptions Disp Refills   JARDIANCE 10 MG TABS tablet [Pharmacy Med Name: Jardiance Oral Tablet 10 MG] 90 tablet 3    Sig: TAKE 1 TABLET EVERY DAY     Endocrinology:  Diabetes - SGLT2 Inhibitors Passed - 10/21/2023 11:46 AM      Passed - Cr in normal range and within 360 days    Creat  Date Value Ref Range Status  07/09/2023 0.82 0.70 - 1.35 mg/dL Final   Creatinine, Urine  Date Value Ref Range Status  04/22/2023 86 20 - 320 mg/dL Final         Passed - HBA1C is between 0 and 7.9 and within 180 days    Hgb A1c MFr Bld  Date Value Ref Range Status  07/09/2023 6.3 (H) <5.7 % of total Hgb Final    Comment:    For someone without known diabetes, a hemoglobin  A1c value between 5.7% and 6.4% is consistent with prediabetes and should be confirmed with a  follow-up test. . For someone with known diabetes, a value <7% indicates that their diabetes is well controlled. A1c targets should be individualized based on duration of diabetes, age, comorbid conditions, and other considerations. . This assay result is consistent with an increased risk of diabetes. . Currently, no consensus exists regarding use of hemoglobin A1c for diagnosis of diabetes for children. .          Passed - eGFR in normal range and within 360 days    GFR, Est African American  Date Value Ref Range Status  12/18/2017 109 > OR = 60 mL/min/1.36m2 Final   GFR, Est Non African American  Date Value Ref Range Status  12/18/2017 94 > OR = 60 mL/min/1.15m2 Final   eGFR  Date Value Ref Range Status  07/09/2023 97 > OR = 60 mL/min/1.65m2 Final  11/14/2020 105 >59 mL/min/1.73 Final         Passed - Valid encounter within last 6 months    Recent Outpatient Visits           3 months ago Encounter for general adult medical examination with abnormal  findings   [place] [facility] [place], Salvadore Oxford, NP   4 months ago Impacted cerumen of left ear   [place] [facility] [place], [place] W, NP   6 months ago Type 2 diabetes mellitus with diabetic peripheral angiopathy without gangrene, without long-term current use of insulin [facility])   [place] [facility] [place], Salvadore Oxford, NP   7 months ago Chronic nausea   [place] [facility] [place], [place] W, NP   9 months ago Hyperlipidemia associated with type 2 diabetes mellitus [facility])   [place] [facility] [place], Salvadore Oxford, NP       Future Appointments             In 1 month Salena Saner, MD [facility] [place] Pulmonary Care at [place]   In 2 months Baity, Salvadore Oxford, NP [place] [facility], [facility]   In 3 months Vanna Scotland, MD [facility] [place]             ELIQUIS 5 MG TABS tablet [Pharmacy Med Name: Eliquis Oral Tablet 5 MG] 180 tablet 3    Sig: TAKE 1 TABLET TWICE  DAILY     Hematology:  Anticoagulants - apixaban Passed - 10/21/2023 11:46 AM      Passed - PLT in normal range and within 360 days    Platelets  Date Value Ref Range Status  07/09/2023 236 140 - 400 Thousand/uL Final  11/14/2020 287 150 - 450 x10E3/uL Final         Passed - HGB in normal range and within 360 days    Hemoglobin  Date Value Ref Range Status  07/09/2023 15.6 13.2 - 17.1 g/dL Final  82/95/6213 08.6 (L) 13.0 - 17.7 g/dL Final         Passed - HCT in normal range and within 360 days    HCT  Date Value Ref Range Status  07/09/2023 47.7 38.5 - 50.0 % Final   Hematocrit  Date Value Ref Range Status  11/14/2020 40.0 37.5 - 51.0 % Final         Passed - Cr in normal range and within 360 days    Creat  Date Value Ref Range Status  07/09/2023 0.82 0.70 - 1.35 mg/dL Final   Creatinine, Urine  Date Value Ref Range Status  04/22/2023 86 20 - 320 mg/dL Final         Passed -  AST in normal range and within 360 days    AST  Date Value Ref Range Status  07/09/2023 20 10 - 35 U/L Final         Passed - ALT in normal range and within 360 days    ALT  Date Value Ref Range Status  07/09/2023 27 9 - 46 U/L Final         Passed - Valid encounter within last 12 months    Recent Outpatient Visits           3 months ago Encounter for general adult medical examination with abnormal findings   [place] [facility] [place], Salvadore Oxford, NP   4 months ago Impacted cerumen of left ear   [place] [facility] [place], [place] W, NP   6 months ago Type 2 diabetes mellitus with diabetic peripheral angiopathy without gangrene, without long-term current use of insulin [facility])   [place] [facility] [place], Salvadore Oxford, NP   7 months ago Chronic nausea   [place] [facility] [place], [place] W, NP   9 months ago Hyperlipidemia associated with type 2 diabetes mellitus [facility])   [facility] [facility] [place], Salvadore Oxford, NP       Future Appointments             In 1 month Salena Saner, MD [facility]monary Care at [place]   In 2 months Baity, Salvadore Oxford, NP [place] [facility], [facility]   In 3 months Vanna Scotland, MD [facility] [place]

## 2023-10-22 DIAGNOSIS — R110 Nausea: Secondary | ICD-10-CM | POA: Diagnosis not present

## 2023-10-22 DIAGNOSIS — F17200 Nicotine dependence, unspecified, uncomplicated: Secondary | ICD-10-CM | POA: Diagnosis not present

## 2023-10-22 DIAGNOSIS — J449 Chronic obstructive pulmonary disease, unspecified: Secondary | ICD-10-CM | POA: Diagnosis not present

## 2023-10-22 DIAGNOSIS — I50810 Right heart failure, unspecified: Secondary | ICD-10-CM | POA: Diagnosis not present

## 2023-10-22 DIAGNOSIS — R140 Abdominal distension (gaseous): Secondary | ICD-10-CM | POA: Diagnosis not present

## 2023-10-23 ENCOUNTER — Other Ambulatory Visit: Payer: Self-pay | Admitting: Internal Medicine

## 2023-10-24 ENCOUNTER — Encounter (INDEPENDENT_AMBULATORY_CARE_PROVIDER_SITE_OTHER): Payer: Self-pay | Admitting: Vascular Surgery

## 2023-10-24 ENCOUNTER — Ambulatory Visit (INDEPENDENT_AMBULATORY_CARE_PROVIDER_SITE_OTHER): Payer: Medicare HMO | Admitting: Vascular Surgery

## 2023-10-24 VITALS — BP 104/71 | HR 87 | Resp 16 | Wt 168.8 lb

## 2023-10-24 DIAGNOSIS — I7123 Aneurysm of the descending thoracic aorta, without rupture: Secondary | ICD-10-CM

## 2023-10-24 DIAGNOSIS — I7143 Infrarenal abdominal aortic aneurysm, without rupture: Secondary | ICD-10-CM | POA: Diagnosis not present

## 2023-10-24 DIAGNOSIS — E1151 Type 2 diabetes mellitus with diabetic peripheral angiopathy without gangrene: Secondary | ICD-10-CM | POA: Diagnosis not present

## 2023-10-24 DIAGNOSIS — I482 Chronic atrial fibrillation, unspecified: Secondary | ICD-10-CM | POA: Diagnosis not present

## 2023-10-24 DIAGNOSIS — I70213 Atherosclerosis of native arteries of extremities with intermittent claudication, bilateral legs: Secondary | ICD-10-CM | POA: Diagnosis not present

## 2023-10-24 NOTE — Telephone Encounter (Signed)
 Requested medication (s) are due for refill today:   Yes  Requested medication (s) are on the active medication list:   Yes  Future visit scheduled:   Yes in 2 mo.   Last ordered: 09/09/2023 #53, 0 refills  Returned for provider review    Requested Prescriptions  Pending Prescriptions Disp Refills   varenicline (CHANTIX) 1 MG tablet [Pharmacy Med Name: Varenicline Tartrate Oral Tablet 1 MG] 53 tablet 11    Sig: TAKE 1/2 TABLET DAILY FOR 3 DAYS, 1/2 TABLET TWICE DAILY FOR 4 DAYS, THEN TAKE 1 TABLET TWICE DAILY     Psychiatry:  Drug Dependence Therapy - varenicline Failed - 10/24/2023 11:52 AM      Failed - Manual Review: Do not refill starter pack. 1 mg tabs may be extended up to one year if the patient has quit smoking but still feels at risk for relapse.      Passed - Cr in normal range and within 180 days    Creat  Date Value Ref Range Status  07/09/2023 0.82 0.70 - 1.35 mg/dL Final   Creatinine, Urine  Date Value Ref Range Status  04/22/2023 86 20 - 320 mg/dL Final         Passed - Completed PHQ-2 or PHQ-9 in the last 360 days      Passed - Valid encounter within last 6 months    Recent Outpatient Visits           3 months ago Encounter for general adult medical examination with abnormal findings   [place] [facility] [place], Salvadore Oxford, NP   4 months ago Impacted cerumen of left ear   [place] [facility] [place], [place] W, NP   6 months ago Type 2 diabetes mellitus with diabetic peripheral angiopathy without gangrene, without long-term current use of insulin [facility])   [place] [facility] [place], Salvadore Oxford, NP   7 months ago Chronic nausea   [place] [facility] [place], [place] W, NP   9 months ago Hyperlipidemia associated with type 2 diabetes mellitus [facility])   [place] [facility] [place], Salvadore Oxford, NP       Future Appointments             In 1 month [facility]ena Saner, MD [facility] at [place]   In 2 months Baity, Salvadore Oxford, NP [place] [facility], [facility]   In 3 months Vanna Scotland, MD [facility] [place]

## 2023-10-24 NOTE — Progress Notes (Signed)
 MRN : [id number]  Ricardo Cervantes. is a 68 y.o. ([birth year]) male who presents with chief complaint of check circulation.  History of Present Illness:   The patient is seen for follow up evaluation of AAA status post CTA on 10/15/2023. There were no problems or complications related to the CT scan.   The patient denies interval development of abdominal or back pain. No new lower extremity pain or discoloration of the toes.   The patient denies interval anaurosis fugax. There is no recent history of TIA symptoms or focal motor deficits.   The patient denies PAD or claudication symptoms.   The patient denies recent episodes of angina or shortness of breath.  CT angiography of the abdomen and pelvis is reviewed by me and shows an infrarenal AAA that measures 5.4 cm with a descending thoracic aortic aneurysm that measures 6 cm maximally but returns to 3 cm proximal to the celiac artery.  No outpatient medications have been marked as taking for the 10/24/23 encounter (Appointment) with Gilda Crease, Latina Craver, MD.    Past Medical History:  Diagnosis Date   Arthritis    Rheumatoid   Bipolar disorder (HCC)    Complication of anesthesia    difficulty keeping patient asleep. fentanyl makes patient mean, grumpy and difficult   Depression    Fusion of spine    GERD (gastroesophageal reflux disease)    slightly uncomfortable. takes otc antacids occasionally   Headache    deteriorating discs causing headaches. uses advil   Hepatitis C    HEP "C". treated 2 years ago   History of kidney stones 2016   Hyperlipidemia    Pericarditis    20 years ago   Peripheral vascular disease (HCC)    Smoker     Past Surgical History:  Procedure Laterality Date   ABDOMINAL AORTOGRAM W/LOWER EXTREMITY N/A 01/15/2017   Procedure: Abdominal Aortogram w/Lower Extremity;  Surgeon: Renford Dills, MD;  Location: [facility] INVASIVE CV LAB;   Service: Cardiovascular;  Laterality: N/A;   BACK SURGERY  2004   acdf, titanium c5-6   FEMORAL-POPLITEAL BYPASS GRAFT Right 06/19/2017   Procedure: BYPASS GRAFT FEMORAL-POPLITEAL ARTERY;  Surgeon: Renford Dills, MD;  Location: [facility] ORS;  Service: Vascular;  Laterality: Right;   LOWER EXTREMITY ANGIOGRAPHY Right 01/15/2017   Procedure: Lower Extremity Angiography;  Surgeon: Renford Dills, MD;  Location: [facility] INVASIVE CV LAB;  Service: Cardiovascular;  Laterality: Right;   LOWER EXTREMITY ANGIOGRAPHY Right 02/13/2017   Procedure: Lower Extremity Angiography;  Surgeon: Renford Dills, MD;  Location: [facility] INVASIVE CV LAB;  Service: Cardiovascular;  Laterality: Right;    Social History Social History   Tobacco Use   Smoking status: Every Day    Current packs/day: 3.00    Average packs/day: 3.0 packs/day for 10.1 years (30.4 ttl pk-yrs)    Types: Cigarettes    Start date: 09/03/2013   Smokeless tobacco: Never   Tobacco comments:    Smoking 4 cigarettes daily 09/20/2023  Vaping Use   Vaping status: Former  Substance Use Topics   Alcohol use: Not Currently  Alcohol/week: 2.0 standard drinks of alcohol    Types: 2 Shots of liquor per week    Comment: occasional (every 3-4 weeks)    Drug use: No    Comment: polysubstance approx 2 years ago    Family History Family History  Problem Relation Age of Onset   Cancer Mother        liver   Heart disease Father    Diabetes Father    Breast cancer Sister    Brain cancer Sister     Allergies  Allergen Reactions   Chantix [Varenicline] Other (See Comments)    Depression     REVIEW OF SYSTEMS (Negative unless checked)  Constitutional: [] Weight loss  [] Fever  [] Chills Cardiac: [] Chest pain   [] Chest pressure   [] Palpitations   [] Shortness of breath when laying flat   [] Shortness of breath with exertion. Vascular:  [x] Pain in legs with walking   [] Pain in legs at rest  [] History of DVT   [] Phlebitis   [] Swelling in legs    [] Varicose veins   [] Non-healing ulcers Pulmonary:   [] Uses home oxygen   [] Productive cough   [] Hemoptysis   [] Wheeze  [] COPD   [] Asthma Neurologic:  [] Dizziness   [] Seizures   [] History of stroke   [] History of TIA  [] Aphasia   [] Vissual changes   [] Weakness or numbness in arm   [] Weakness or numbness in leg Musculoskeletal:   [] Joint swelling   [] Joint pain   [] Low back pain Hematologic:  [] Easy bruising  [] Easy bleeding   [] Hypercoagulable state   [] Anemic Gastrointestinal:  [] Diarrhea   [] Vomiting  [] Gastroesophageal reflux/heartburn   [] Difficulty swallowing. Genitourinary:  [] Chronic kidney disease   [] Difficult urination  [] Frequent urination   [] Blood in urine Skin:  [] Rashes   [] Ulcers  Psychological:  [] History of anxiety   []  History of major depression.  Physical Examination  There were no vitals filed for this visit. There is no height or weight on file to calculate BMI. Gen: WD/WN, NAD Head: [place]/AT, No temporalis wasting.  Ear/Nose/Throat: Hearing grossly intact, nares w/o erythema or drainage Eyes: PER, EOMI, sclera nonicteric.  Neck: Supple, no masses.  No bruit or JVD.  Pulmonary:  Good air movement, no audible wheezing, no use of accessory muscles.  Cardiac: RRR, normal S1, S2, no Murmurs. Vascular:  mild trophic changes, no open wounds Vessel Right Left  Radial Palpable Palpable  PT Not Palpable Not Palpable  DP Not Palpable Not Palpable  Gastrointestinal: soft, non-distended. No guarding/no peritoneal signs.  Musculoskeletal: M/S 5/5 throughout.  No visible deformity.  Neurologic: CN 2-12 intact. Pain and light touch intact in extremities.  Symmetrical.  Speech is fluent. Motor exam as listed above. Psychiatric: Judgment intact, Mood & affect appropriate for pt's clinical situation. Dermatologic: No rashes or ulcers noted.  No changes consistent with cellulitis.   CBC Lab Results  Component Value Date   WBC 7.4 07/09/2023   HGB 15.6 07/09/2023   HCT 47.7  07/09/2023   MCV 95.0 07/09/2023   PLT 236 07/09/2023    BMET    Component Value Date/Time   NA 138 07/09/2023 1341   NA 137 11/14/2020 1409   K 4.3 07/09/2023 1341   CL 100 07/09/2023 1341   CO2 28 07/09/2023 1341   GLUCOSE 94 07/09/2023 1341   BUN 20 07/09/2023 1341   BUN 15 11/14/2020 1409   CREATININE 0.82 07/09/2023 1341   CALCIUM 9.9 07/09/2023 1341   GFRNONAA 94 12/18/2017 1137   GFRAA 109  12/18/2017 1137   CrCl cannot be calculated (Patient's most recent lab result is older than the maximum 21 days allowed.).  COAG Lab Results  Component Value Date   INR 1.10 06/13/2017    Radiology No results found.   Assessment/Plan 1. Aneurysm of descending thoracic aorta without rupture (HCC) (Primary) Recommend:  The descending  TAA is >6 cm and therefore should undergo repair. Patient is status post CT scan of the thoracic and abdominal aorta. Base on evaluation of the CT scan by me the patient is a candidate for endovascular repair.   The patient will require cardiac clearance prior to stent graft placement.   The patient will continue antiplatelet therapy as prescribed (since the patient is undergoing endovascular repair as opposed to open repair) as well as aggressive management of hyperlipidemia. Exercise is encouraged.   The patient is reminded that lifetime routine surveillance is a necessity with an endograft.   The risks and benefits of AAA repair are reviewed with the patient.  All questions are answered.  Alternative therapies are also discussed.  The patient agrees to proceed with endovascular aneurysm repair to prevent lethal rupture.  Patient will follow-up with me in the office after the surgery. - Ambulatory referral to Cardiology  2. Infrarenal abdominal aortic aneurysm (AAA) without rupture (HCC) Recommend:  The AAA is > 5 cm and therefore should undergo repair. Patient is status post CT scan of the abdominal aorta. Base on evaluation of the CT scan by  me the patient is a candidate for endovascular repair.   The patient will require cardiac clearance prior to stent graft placement.   The patient will continue antiplatelet therapy as prescribed (since the patient is undergoing endovascular repair as opposed to open repair) as well as aggressive management of hyperlipidemia. Exercise is encouraged.   The patient is reminded that lifetime routine surveillance is a necessity with an endograft.   The risks and benefits of AAA repair are reviewed with the patient.  All questions are answered.  Alternative therapies are also discussed.  The patient agrees to proceed with endovascular aneurysm repair to prevent lethal rupture.  This portion will be fixed second and staged approximately 1 month after the thoracic component has been fixed  Patient will follow-up with me in the office after the surgery. - Ambulatory referral to Cardiology  3. Atherosclerosis of native artery of both lower extremities with intermittent claudication (HCC)  Recommend:  The patient has evidence of atherosclerosis of the lower extremities with claudication.  The patient does not voice lifestyle limiting changes at this point in time.  Noninvasive studies do not suggest clinically significant change.  No invasive studies, angiography or surgery at this time The patient should continue walking and begin a more formal exercise program.  The patient should continue antiplatelet therapy and aggressive treatment of the lipid abnormalities  No changes in the patient's medications at this time  Continued surveillance is indicated as atherosclerosis is likely to progress with time.    The patient will continue follow up with noninvasive studies as ordered.   4. Chronic atrial fibrillation (HCC) Continue antiarrhythmia medications as already ordered, these medications have been reviewed and there are no changes at this time.  Continue anticoagulation as ordered by  Cardiology Service  5. Type 2 diabetes mellitus with diabetic peripheral angiopathy without gangrene, without long-term current use of insulin (HCC) Continue hypoglycemic medications as already ordered, these medications have been reviewed and there are no changes at this time.  Hgb A1C to be monitored as already arranged by primary service    Levora Dredge, MD  10/24/2023 9:22 AM

## 2023-10-28 DIAGNOSIS — R140 Abdominal distension (gaseous): Secondary | ICD-10-CM | POA: Diagnosis not present

## 2023-10-28 DIAGNOSIS — I714 Abdominal aortic aneurysm, without rupture, unspecified: Secondary | ICD-10-CM | POA: Diagnosis not present

## 2023-10-28 DIAGNOSIS — R162 Hepatomegaly with splenomegaly, not elsewhere classified: Secondary | ICD-10-CM | POA: Diagnosis not present

## 2023-10-28 DIAGNOSIS — K828 Other specified diseases of gallbladder: Secondary | ICD-10-CM | POA: Diagnosis not present

## 2023-10-31 ENCOUNTER — Telehealth (INDEPENDENT_AMBULATORY_CARE_PROVIDER_SITE_OTHER): Payer: Self-pay

## 2023-10-31 NOTE — Telephone Encounter (Signed)
 Patient called for information about upcoming surgery. I seen in [facility] chart about a medical clearance.  Per [name]- what's the status of his procedure  Please advise

## 2023-11-07 ENCOUNTER — Other Ambulatory Visit: Payer: Self-pay | Admitting: Internal Medicine

## 2023-11-07 NOTE — Telephone Encounter (Signed)
 Rx 06/14/23 #90 1RF- too soon, and needs lab Requested Prescriptions  Pending Prescriptions Disp Refills   digoxin (LANOXIN) 0.25 MG tablet [Pharmacy Med Name: Digoxin Oral Tablet 250 MCG] 90 tablet 3    Sig: TAKE 1 TABLET EVERY DAY     Cardiovascular:  Antiarrhythmic Agents - digoxin Failed - 11/07/2023  3:35 PM      Failed - Digoxin (serum) in normal range and within 360 days    No results found for: "DIGOXIN"       Failed - Mg Level in normal range and within 360 days    Magnesium  Date Value Ref Range Status  12/14/2021 2.0 1.5 - 2.5 mg/dL Final         Failed - Patient had ECG in the last 360 days      Passed - Cr in normal range and within 180 days    Creat  Date Value Ref Range Status  07/09/2023 0.82 0.70 - 1.35 mg/dL Final   Creatinine, Urine  Date Value Ref Range Status  04/22/2023 86 20 - 320 mg/dL Final         Passed - Ca in normal range and within 360 days    Calcium  Date Value Ref Range Status  07/09/2023 9.9 8.6 - 10.3 mg/dL Final         Passed - K in normal range and within 180 days    Potassium  Date Value Ref Range Status  07/09/2023 4.3 3.5 - 5.3 mmol/L Final         Passed - Last Heart Rate in normal range    Pulse Readings from Last 1 Encounters:  10/24/23 87         Passed - Valid encounter within last 6 months    Recent Outpatient Visits           4 months ago Encounter for general adult medical examination with abnormal findings   [place] [facility] [place], Salvadore Oxford, NP   5 months ago Impacted cerumen of left ear   [place] [facility] [place], Salvadore Oxford, NP   6 months ago Type 2 diabetes mellitus with diabetic peripheral angiopathy without gangrene, without long-term current use of insulin [facility])   [place] [facility] [place], Salvadore Oxford, NP   7 months ago Chronic nausea   [place] [facility] [place], [place] W, NP   9 months ago Hyperlipidemia associated with type 2  diabetes mellitus [facility])   [place] [facility] [place], Salvadore Oxford, NP       Future Appointments             In 3 weeks Salena Saner, MD [facility] Pulmonary Care at [place]   In 2 months Baity, Salvadore Oxford, NP  [facility]   In 3 months Vanna Scotland, MD [facility] [place]

## 2023-11-08 ENCOUNTER — Telehealth (INDEPENDENT_AMBULATORY_CARE_PROVIDER_SITE_OTHER): Payer: Self-pay

## 2023-11-08 NOTE — Telephone Encounter (Signed)
 Patient called asking about cardiac clearance. I advised that his clearance had been sent to his cardiologist at [facility] on 10/25/23. Patient then stated that he wanted to see another cardiologist. I reached out to [facility] to get the patient an appt on 11/11/23 with a 10:15 am appt. I have attempted to contact the patient to give him this information. I have also left a message with his son to have him call back as well.

## 2023-11-17 ENCOUNTER — Other Ambulatory Visit: Payer: Self-pay | Admitting: Internal Medicine

## 2023-11-19 NOTE — Telephone Encounter (Signed)
 Requested Prescriptions  Pending Prescriptions Disp Refills   DULoxetine (CYMBALTA) 60 MG capsule [Pharmacy Med Name: DULoxetine HCl Oral Capsule Delayed Release Particles 60 MG] 90 capsule 0    Sig: TAKE 1 CAPSULE EVERY DAY     Psychiatry: Antidepressants - SNRI - duloxetine Passed - 11/19/2023 10:58 AM      Passed - Cr in normal range and within 360 days    Creat  Date Value Ref Range Status  07/09/2023 0.82 0.70 - 1.35 mg/dL Final   Creatinine, Urine  Date Value Ref Range Status  04/22/2023 86 20 - 320 mg/dL Final         Passed - eGFR is 30 or above and within 360 days    GFR, Est African American  Date Value Ref Range Status  12/18/2017 109 > OR = 60 mL/min/1.49m2 Final   GFR, Est Non African American  Date Value Ref Range Status  12/18/2017 94 > OR = 60 mL/min/1.87m2 Final   eGFR  Date Value Ref Range Status  07/09/2023 97 > OR = 60 mL/min/1.26m2 Final  11/14/2020 105 >59 mL/min/1.73 Final         Passed - Completed PHQ-2 or PHQ-9 in the last 360 days      Passed - Last BP in normal range    BP Readings from Last 1 Encounters:  10/24/23 104/71         Passed - Valid encounter within last 6 months    Recent Outpatient Visits           4 months ago Encounter for general adult medical examination with abnormal findings   [place] [facility] [place], Salvadore Oxford, NP   5 months ago Impacted cerumen of left ear   [place] [facility] [place], [place] W, NP   7 months ago Type 2 diabetes mellitus with diabetic peripheral angiopathy without gangrene, without long-term current use of insulin [facility])   [place] [facility] [place], Salvadore Oxford, NP   8 months ago Chronic nausea   [place] [facility] [place], [place] W, NP   9 months ago Hyperlipidemia associated with type 2 diabetes mellitus [facility])   [place] [facility] [place], Salvadore Oxford, NP       Future Appointments             In 1 week  Salena Saner, MD [facility] Pulmonary Care at [place]   In 1 month [place], Salvadore Oxford, NP [place] [facility], [facility]   In 2 months Vanna Scotland, MD [facility] [place]

## 2023-11-20 ENCOUNTER — Telehealth (INDEPENDENT_AMBULATORY_CARE_PROVIDER_SITE_OTHER): Payer: Self-pay

## 2023-11-20 NOTE — Telephone Encounter (Signed)
 I attempted to contact the patient to give him information regarding his surgery with Dr. Gilda Crease for a Thoracic Aortic Aneurysm on 12/04/23 at [facility]. Pre-op is son 11/26/23 at 9:00 am. Pre-surgical instructions will be sent to Mychart and mailed. Patient's arrival time is 1:30 pm.

## 2023-11-23 ENCOUNTER — Other Ambulatory Visit: Payer: Self-pay | Admitting: Internal Medicine

## 2023-11-25 NOTE — Telephone Encounter (Signed)
 Requested Prescriptions  Pending Prescriptions Disp Refills   gabapentin (NEURONTIN) 300 MG capsule [Pharmacy Med Name: Gabapentin Oral Capsule 300 MG] 90 capsule 1    Sig: TAKE 1 CAPSULE (300MG ) AT BEDTIME. IN ADDITION TO 600MG  FOR A TOTAL OF 900MG  AT BEDTIME     Neurology: Anticonvulsants - gabapentin Passed - 11/25/2023  3:16 PM      Passed - Cr in normal range and within 360 days    Creat  Date Value Ref Range Status  07/09/2023 0.82 0.70 - 1.35 mg/dL Final   Creatinine, Urine  Date Value Ref Range Status  04/22/2023 86 20 - 320 mg/dL Final         Passed - Completed PHQ-2 or PHQ-9 in the last 360 days      Passed - Valid encounter within last 12 months    Recent Outpatient Visits           4 months ago Encounter for general adult medical examination with abnormal findings   [facility] [place], Salvadore Oxford, NP   5 months ago Impacted cerumen of left ear   [facility] [place], [place] W, NP   7 months ago Type 2 diabetes mellitus with diabetic peripheral angiopathy without gangrene, without long-term current use of insulin [facility])   [facility] [place], Salvadore Oxford, NP   8 months ago Chronic nausea   [place] [facility] [place], [place] W, NP   10 months ago Hyperlipidemia associated with type 2 diabetes mellitus [facility])   [place] [facility] [place], Salvadore Oxford, NP       Future Appointments             In 3 days Salena Saner, MD [facility] [place]   In 1 month [place], Salvadore Oxford, NP  [facility]   In 2 months Vanna Scotland, MD [facility] [place]

## 2023-11-26 ENCOUNTER — Other Ambulatory Visit: Payer: Self-pay

## 2023-11-26 ENCOUNTER — Other Ambulatory Visit (INDEPENDENT_AMBULATORY_CARE_PROVIDER_SITE_OTHER): Payer: Self-pay | Admitting: Nurse Practitioner

## 2023-11-26 ENCOUNTER — Encounter
Admission: RE | Admit: 2023-11-26 | Discharge: 2023-11-26 | Disposition: A | Discharge status: Home / Self Care | Source: Ambulatory Visit | Attending: Vascular Surgery | Admitting: Vascular Surgery

## 2023-11-26 VITALS — BP 103/72 | HR 100 | Resp 18 | Ht 74.0 in | Wt 167.8 lb

## 2023-11-26 DIAGNOSIS — I7123 Aneurysm of the descending thoracic aorta, without rupture: Secondary | ICD-10-CM

## 2023-11-26 DIAGNOSIS — I5020 Unspecified systolic (congestive) heart failure: Secondary | ICD-10-CM | POA: Diagnosis not present

## 2023-11-26 DIAGNOSIS — E119 Type 2 diabetes mellitus without complications: Secondary | ICD-10-CM

## 2023-11-26 DIAGNOSIS — Z01818 Encounter for other preprocedural examination: Secondary | ICD-10-CM | POA: Insufficient documentation

## 2023-11-26 DIAGNOSIS — I482 Chronic atrial fibrillation, unspecified: Secondary | ICD-10-CM | POA: Diagnosis not present

## 2023-11-26 DIAGNOSIS — Z7901 Long term (current) use of anticoagulants: Secondary | ICD-10-CM

## 2023-11-26 DIAGNOSIS — Z01812 Encounter for preprocedural laboratory examination: Secondary | ICD-10-CM

## 2023-11-26 HISTORY — DX: Thoracic aortic aneurysm, without rupture, unspecified: I71.20

## 2023-11-26 HISTORY — DX: Unspecified atrial fibrillation: I48.91

## 2023-11-26 HISTORY — DX: Cerebral infarction, unspecified: I63.9

## 2023-11-26 HISTORY — DX: Cardiac arrhythmia, unspecified: I49.9

## 2023-11-26 HISTORY — DX: Benign prostatic hyperplasia without lower urinary tract symptoms: N40.0

## 2023-11-26 HISTORY — DX: Type 2 diabetes mellitus without complications: E11.9

## 2023-11-26 HISTORY — DX: Male erectile dysfunction, unspecified: N52.9

## 2023-11-26 HISTORY — DX: Unspecified systolic (congestive) heart failure: I50.20

## 2023-11-26 HISTORY — DX: Atherosclerotic heart disease of native coronary artery without angina pectoris: I25.10

## 2023-11-26 HISTORY — DX: Diverticulosis of intestine, part unspecified, without perforation or abscess without bleeding: K57.90

## 2023-11-26 HISTORY — DX: Pneumonia, unspecified organism: J18.9

## 2023-11-26 HISTORY — DX: Personal history of other infectious and parasitic diseases: Z86.19

## 2023-11-26 HISTORY — DX: Personal history of tuberculosis: Z86.11

## 2023-11-26 HISTORY — DX: Dyspnea, unspecified: R06.00

## 2023-11-26 HISTORY — DX: Unspecified protein-calorie malnutrition: E46

## 2023-11-26 HISTORY — DX: Other disorders of lung: J98.4

## 2023-11-26 HISTORY — DX: Infrarenal abdominal aortic aneurysm, without rupture: I71.43

## 2023-11-26 HISTORY — DX: Chronic obstructive pulmonary disease, unspecified: J44.9

## 2023-11-26 HISTORY — DX: Long term (current) use of anticoagulants: Z79.01

## 2023-11-26 HISTORY — DX: Rheumatoid arthritis, unspecified: M06.9

## 2023-11-26 HISTORY — DX: Aneurysm of the descending thoracic aorta, without rupture: I71.23

## 2023-11-26 LAB — BASIC METABOLIC PANEL
Anion gap: 11 (ref 5–15)
BUN: 19 mg/dL (ref 8–23)
CO2: 26 mmol/L (ref 22–32)
Calcium: 9.8 mg/dL (ref 8.9–10.3)
Chloride: 100 mmol/L (ref 98–111)
Creatinine, Ser: 0.83 mg/dL (ref 0.61–1.24)
GFR, Estimated: >60 mL/min (ref >60)
Glucose, Bld: 109 mg/dL — ABNORMAL HIGH (ref 70–99)
Potassium: 4 mmol/L (ref 3.5–5.1)
Sodium: 137 mmol/L (ref 135–145)

## 2023-11-26 LAB — CBC
HCT: 46.4 % (ref 39.0–52.0)
Hemoglobin: 15.3 g/dL (ref 13.0–17.0)
MCH: 30.4 pg (ref 26.0–34.0)
MCHC: 33 g/dL (ref 30.0–36.0)
MCV: 92.1 fL (ref 80.0–100.0)
Platelets: 225 10*3/uL (ref 150–400)
RBC: 5.04 MIL/uL (ref 4.22–5.81)
RDW: 14.9 % (ref 11.5–15.5)
WBC: 6.7 10*3/uL (ref 4.0–10.5)
nRBC: 0 % (ref 0.0–0.2)

## 2023-11-26 LAB — TYPE AND SCREEN
ABO/RH(D): A POS
Antibody Screen: NEGATIVE

## 2023-11-26 LAB — SURGICAL PCR SCREEN
MRSA, PCR: NEGATIVE
Staphylococcus aureus: NEGATIVE

## 2023-11-26 NOTE — Patient Instructions (Addendum)
 Your procedure is scheduled on: 12/04/23 - Wednesday Report to the Registration Desk on the 1st floor of the [facility] at 1:30 pm  REMEMBER: Instructions that are not followed completely may result in serious medical risk, up to and including death; or upon the discretion of your surgeon and anesthesiologist your surgery may need to be rescheduled.  Do not eat food after midnight the night before surgery.  No gum chewing or hard candies.  You may however, drink CLEAR liquids up to 2 hours before you are scheduled to arrive for your surgery. Do not drink anything within 2 hours of your scheduled arrival time.  Clear liquids include: - water   One week prior to surgery beginning 11/27/23: Stop Anti-inflammatories (NSAIDS) such as Advil, Aleve, Ibuprofen, Motrin, Naproxen, Naprosyn and Aspirin based products such as Excedrin, Goody's Powder, BC Powder. You may take Tylenol if needed for pain up until the day of surgery.  Stop ANY OVER THE COUNTER supplements until after surgery.  HOLD apixaban (ELIQUIS) beginning 12/01/23.  HOLD empagliflozin (JARDIANCE) beginning 12/01/23.  HOLD sildenafil (VIAGRA) beginning 12/02/23.  ON THE DAY OF SURGERY ONLY TAKE THESE MEDICATIONS WITH SIPS OF WATER:  atorvastatin (LIPITOR ) digoxin (LANOXIN)  DULoxetine (CYMBALTA)  finasteride (PROSCAR)  gabapentin (NEURONTIN)  metoprolol succinate if you normally take it in the morning. pantoprazole (PROTONIX)   Use inhalers albuterol (VENTOLIN HFA) andTRELEGY ELLIPTA  on the day of surgery and bring to the hospital.  No Alcohol for 24 hours before or after surgery.  No Smoking including e-cigarettes for 24 hours before surgery.  No chewable tobacco products for at least 6 hours before surgery.  No nicotine patches on the day of surgery.  Do not use any "recreational" drugs for at least a week (preferably 2 weeks) before your surgery.  Please be advised that the combination of cocaine and anesthesia  may have negative outcomes, up to and including death. If you test positive for cocaine, your surgery will be cancelled.  On the morning of surgery brush your teeth with toothpaste and water, you may rinse your mouth with mouthwash if you wish. Do not swallow any toothpaste or mouthwash.  Use CHG Soap or wipes as directed on instruction sheet.  Do not wear jewelry, make-up, hairpins, clips or nail polish.  For welded (permanent) jewelry: bracelets, anklets, waist bands, etc.  Please have this removed prior to surgery.  If it is not removed, there is a chance that hospital personnel will need to cut it off on the day of surgery.  Do not wear lotions, powders, or perfumes.   Do not shave body hair from the neck down 48 hours before surgery.  Contact lenses, hearing aids and dentures may not be worn into surgery.  Do not bring valuables to the hospital. [facility] is not responsible for any missing/lost belongings or valuables.   Notify your doctor if there is any change in your medical condition (cold, fever, infection).  Wear comfortable clothing (specific to your surgery type) to the hospital.  After surgery, you can help prevent lung complications by doing breathing exercises.  Take deep breaths and cough every 1-2 hours. Your doctor may order a device called an Incentive Spirometer to help you take deep breaths.  When coughing or sneezing, hold a pillow firmly against your incision with both hands. This is called "splinting." Doing this helps protect your incision. It also decreases belly discomfort.  If you are being admitted to the hospital overnight, leave your suitcase  in the car. After surgery it may be brought to your room.  In case of increased patient census, it may be necessary for you, the patient, to continue your postoperative care in the Same Day Surgery department.  If you are being discharged the day of surgery, you will not be allowed to drive home. You will need  a responsible individual to drive you home and stay with you for 24 hours after surgery.   If you are taking public transportation, you will need to have a responsible individual with you.  Please call the Pre-admissions Testing Dept. at [phone] if you have any questions about these instructions.  Surgery Visitation Policy:  Patients having surgery or a procedure may have two visitors.  Children under the age of 20 must have an adult with them who is not the patient.  Temporary Visitor Restrictions Due to increasing cases of flu, RSV and COVID-19: Children ages 48 and under will not be able to visit patients in [facility] hospitals under most circumstances.  Inpatient Visitation:    Visiting hours are 7 a.m. to 8 p.m. Up to four visitors are allowed at one time in a patient room. The visitors may rotate out with other people during the day.  One visitor age 89 or older may stay with the patient overnight and must be in the room by 8 p.m.    Preparing for Surgery with CHLORHEXIDINE GLUCONATE (CHG) Soap  Chlorhexidine Gluconate (CHG) Soap  o An antiseptic cleaner that kills germs and bonds with the skin to continue killing germs even after washing  o Used for showering the night before surgery and morning of surgery  Before surgery, you can play an important role by reducing the number of germs on your skin.  CHG (Chlorhexidine gluconate) soap is an antiseptic cleanser which kills germs and bonds with the skin to continue killing germs even after washing.  Please do not use if you have an allergy to CHG or antibacterial soaps. If your skin becomes reddened/irritated stop using the CHG.  1. Shower the NIGHT BEFORE SURGERY and the MORNING OF SURGERY with CHG soap.  2. If you choose to wash your hair, wash your hair first as usual with your normal shampoo.  3. After shampooing, rinse your hair and body thoroughly to remove the shampoo.  4. Use CHG as you would any other  liquid soap. You can apply CHG directly to the skin and wash gently with a scrungie or a clean washcloth.  5. Apply the CHG soap to your body only from the neck down. Do not use on open wounds or open sores. Avoid contact with your eyes, ears, mouth, and genitals (private parts). Wash face and genitals (private parts) with your normal soap.  6. Wash thoroughly, paying special attention to the area where your surgery will be performed.  7. Thoroughly rinse your body with warm water.  8. Do not shower/wash with your normal soap after using and rinsing off the CHG soap.  9. Pat yourself dry with a clean towel.  10. Wear clean pajamas to bed the night before surgery.  12. Place clean sheets on your bed the night of your first shower and do not sleep with pets.  13. Shower again with the CHG soap on the day of surgery prior to arriving at the hospital.  14. Do not apply any deodorants/lotions/powders.  15. Please wear clean clothes to the hospital.

## 2023-11-28 ENCOUNTER — Telehealth: Payer: Self-pay

## 2023-11-28 ENCOUNTER — Other Ambulatory Visit: Payer: Self-pay

## 2023-11-28 ENCOUNTER — Ambulatory Visit (INDEPENDENT_AMBULATORY_CARE_PROVIDER_SITE_OTHER): Payer: Medicare HMO | Admitting: Pulmonary Disease

## 2023-11-28 ENCOUNTER — Encounter: Payer: Self-pay | Admitting: Vascular Surgery

## 2023-11-28 ENCOUNTER — Encounter: Payer: Self-pay | Admitting: Pulmonary Disease

## 2023-11-28 VITALS — BP 116/70 | HR 72 | Temp 97.8°F | Ht 74.0 in | Wt 167.4 lb

## 2023-11-28 DIAGNOSIS — A310 Pulmonary mycobacterial infection: Secondary | ICD-10-CM | POA: Diagnosis not present

## 2023-11-28 DIAGNOSIS — R918 Other nonspecific abnormal finding of lung field: Secondary | ICD-10-CM

## 2023-11-28 DIAGNOSIS — I5020 Unspecified systolic (congestive) heart failure: Secondary | ICD-10-CM

## 2023-11-28 DIAGNOSIS — J449 Chronic obstructive pulmonary disease, unspecified: Secondary | ICD-10-CM | POA: Diagnosis not present

## 2023-11-28 DIAGNOSIS — Z01811 Encounter for preprocedural respiratory examination: Secondary | ICD-10-CM

## 2023-11-28 DIAGNOSIS — I7123 Aneurysm of the descending thoracic aorta, without rupture: Secondary | ICD-10-CM

## 2023-11-28 DIAGNOSIS — F17210 Nicotine dependence, cigarettes, uncomplicated: Secondary | ICD-10-CM

## 2023-11-28 MED ORDER — TRELEGY ELLIPTA 200-62.5-25 MCG/ACT IN AEPB: 1.0000 | INHALATION_SPRAY | Freq: Every day | RESPIRATORY_TRACT | Status: DC

## 2023-11-28 MED ORDER — AZITHROMYCIN 500 MG PO TABS: 500.0000 mg | ORAL_TABLET | Freq: Every day | ORAL | 0 refills | Status: AC

## 2023-11-28 MED ORDER — DEXLANSOPRAZOLE 30 MG PO CPDR: 30.0000 mg | DELAYED_RELEASE_CAPSULE | Freq: Two times a day (BID) | ORAL | 0 refills | Status: DC

## 2023-11-28 NOTE — Progress Notes (Signed)
 Perioperative / Anesthesia Services  Pre-Admission Testing Clinical Review / Pre-Operative Anesthesia Consult  Date: 11/28/23  Patient Demographics:  Name: Ricardo Cervantes. DOB: [birth year] MRN:   [id number]  Planned Surgical Procedure(s):    Case: [id number] Date/Time: 12/04/23 1430   Procedure: THORACIC AORTIC ENDOVASCULAR STENT GRAFT   Anesthesia type: General   Diagnosis: Thoracic aortic aneurysm without rupture, unspecified part (HCC) [I71.20]   Pre-op diagnosis: AAA   GORE    ANESTHESIA   Thoracic aortic aneurysm   Location: [facility] / [facility]   Providers: Gilda Crease Latina Craver, MD      NOTE: Available PAT nursing documentation and vital signs have been reviewed. Clinical nursing staff has updated patient's PMH/PSHx, current medication list, and drug allergies/intolerances to ensure comprehensive history available to assist in medical decision making as it pertains to the aforementioned surgical procedure and anticipated anesthetic course. Extensive review of available clinical information personally performed. Ricardo Cervantes PMH and PSHx updated with any diagnoses/procedures that  may have been inadvertently omitted during his intake with the pre-admission testing department's nursing staff.  Clinical Discussion:  Ricardo Hanawalt. is a 68 y.o. male who is submitted for pre-surgical anesthesia review and clearance prior to him undergoing the above procedure. Patient is a Current Smoker (31 pack years). 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  Patient is followed by cardiology Melton Alar, MD). He was last seen in the cardiology clinic on 11/11/2023;  notes reviewed. At the time of his clinic visit, patient with chronic shortness of breath both at rest and with exertion. Respiratory status overall varies in severity.  Patient with reflux type symptoms and is seeing GI. Patient experiences nausea and vomiting if he does not take his medication. This is the only type of chest discomfort that the patient reports. Patient denied any chest pain, PND, orthopnea, palpitations, significant peripheral edema, weakness, fatigue, vertiginous symptoms, or presyncope/syncope. Patient with a past medical history significant for cardiovascular diagnoses. Documented physical exam was grossly benign, providing no evidence of acute exacerbation and/or decompensation of the patient's known cardiovascular conditions. Of note, complete records regarding patient's cardiovascular history unavailable for review at time of consult.  Information gathered from patient report and from notes provided by patient's local care providers.  Patient reported to have suffered a CVA in the past.  Type and location unknown.  Patient denies any type of residual deficits following neurological event.  Most recent myocardial perfusion imaging study was performed on 05/30/2017 revealing a low normal left ventricular systolic function with an EF of 50%.  There were no regional wall motion abnormalities.  No artifact or left ventricular cavity size enlargement appreciated on review of imaging. SPECT images demonstrated no evidence of stress-induced myocardial ischemia or arrhythmia; no scintigraphic evidence of scar.  TID ratio = 1.2. Study determined to be normal and low risk.  Patient underwent diagnostic LEFT heart catheterization on 10/06/2020 revealing normal coronary anatomy with no evidence of angiographically significant coronary artery disease.  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   Most recent TTE performed on 06/17/2023 revealed a moderately reduced left ventricular  systolic function with an EF of 35%. There was mild asymmetric LVH.  There was global hypokinesis noted.  Left  ventricular diastolic Doppler parameters normal.  GLS -12.3%. Right ventricular was mildly enlarged with globally decreased contractility; TAPSE measuring 1.2 cm  (normal range >/= 1.6 cm).  There was mild mitral annular calcification.  There was no significant valvular regurgitation. All transvalvular gradients were noted to be normal providing no evidence suggestive of valvular stenosis.  Aortic sinus mildly dilated at 4.0 cm.  Patient with a known past medical history significant for PVD.  Previous imaging has demonstrated aneurysmal dilatation of both the thoracic and abdominal portions of the aorta.  Most recent CTA imaging of the chest was performed on 10/15/2023.  Falciform aneurysmal changes of the mid descending thoracic aorta measuring up to 65 mm in maximal dimension was noted.  Descending thoracic aorta, just proximal to the hiatus, was diffusely ectatic measuring up to 46 mm.  Additionally, there was a fusiform infrarenal aneurysmal dilatation of the aorta measuring up to 52 mm in maximum dimension.  Scattered atherosclerotic calcifications observed.  The RIGHT common femoral artery bifurcation was aneurysmal with occlusion of the superficial femoral artery despite prior RIGHT lower extremity bypass.  Patient with an atrial fibrillation diagnosis; CHA2DS2-VASc Score = 7 (age, HFrEF, HTN, CVA x 2, vascular disease, T2DM). His rate and rhythm are currently being maintained on oral digoxin + metoprolol succinate. He is chronically anticoagulated using apixaban; reported to be compliant with therapy with no evidence or reports of GI/GU bleeding.  Blood pressure well controlled at 110/82 mmHg on currently prescribed beta-blocker (metoprolol succinate) and diuretic (spironolactone) therapies. He is on atorvastatin for his HLD diagnosis and further ASCVD prevention.  Given patient's history of  known cardiovascular diagnoses, it is important note that patient is on a PDE5i medication (sildenafil) for and erectile dysfunction diagnosis. T2DM well controlled on currently prescribed regimen; last HgbA1c was 6.3% when checked on 07/09/2023. In the setting of known cardiovascular diagnoses and concurrent T2DM, patient is taking an SGLT2i (empagliflozin) for added cardiovascular and renovascular protection. Patient does not have an OSAH diagnosis.  Functional capacity limited by patient's overall respiratory status and other multiple medical comorbidities.  He is able to complete his ADLs/IADLs independently without cardiovascular limitation.  Per the DASI, patient felt to be able to achieve at least 4 METS of physical activity without experiencing any significant degrees of angina/anginal equivalent symptoms.  No changes were made to his medication regimen.  Patient to follow-up with outpatient cardiology in 1 month or sooner if needed.  Ricardo Haven. is scheduled for an elective THORACIC AORTIC ENDOVASCULAR STENT GRAFT on 12/04/2023 with Dr. Levora Dredge, MD.  Given patient's past medical history significant for cardiovascular and cardiopulmonary diagnoses, presurgical clearances from both pulmonary medicine and cardiology were sought by the PAT team.    Per pulmonary medicine Jayme Cloud, MD), "patient appears to be compensated.  Despite his current symptoms he seems fairly compensated.  He has multiple comorbidities.  Aneurysm in danger of rupture therefore needs repair. Current ARISCAT score is 27 indicating INTERMEDIATE risk. There is a 13.3% risk of in-hospital postop pulmonary complications. Follow strict pulmonary hygiene postop. Early mobilization postop as tolerated".  Per cardiology (Custovic, DO), "he has a thoracic aortic aneurysm, the larger of two identified, requiring surgical intervention due to its size and potential risk. He has been aware of the aneurysms for three to four  years. He has a thoracic aortic aneurysm, the larger of two identified, requiring surgical intervention due to its size and potential risk. He has been aware of the aneurysms for three to  four years.  Repeat echocardiogram has been ordered, however patient is cleared for surgery regardless of echocardiogram results.  May proceed with planned surgical intervention at an overall ACCEPTABLE risk".  Again, this patient is on daily oral anticoagulation therapy using a DOAC medication.  He has been instructed on recommendations for holding his apixaban for 3 days prior to his procedure with plans to restart as soon as postoperative bleeding risk felt to be minimized by his attending surgeon. The patient has been instructed that his last dose of apixaban should be on 11/30/2023.  Patient reports previous perioperative complications with anesthesia in the past.  Patient advising of a past history of (+) difficulty maintaining adequate sedation/anesthesia and premature emergence.  He notes that the drug fentanyl makes him "mean, grumpy, and difficult". In review his EMR, it is noted that patient underwent a MAC anesthetic course at [facility] ([facility] in 03/2022 without documented complications.      11/28/2023    9:46 AM 11/26/2023    9:04 AM 10/24/2023   10:17 AM  Vitals with BMI  Height 6\' 2"  6\' 2"    Weight 167 lbs 6 oz 167 lbs 12 oz 168 lbs 13 oz  BMI 21.48 21.53 21.66  Systolic 116 103 161  Diastolic 70 72 71  Pulse 72 100 87   Providers/Specialists:  NOTE: Primary physician provider listed below. Patient may have been seen by APP or partner within same practice.   PROVIDER ROLE / SPECIALTY LAST OV  Schnier, Latina Craver, MD Vascular Surgery (Surgeon) 10/24/2023  Lorre Munroe, NP Primary Care Provider 07/09/2023  Clotilde Dieter, DO Cardiology 11/11/2023  Sarina Ser, MD Pulmonary Medicine 09/20/2023   Allergies:   Allergies  Allergen Reactions    Chantix [Varenicline] Other (See Comments)    Depression   Current Home Medications:   No current facility-administered medications for this encounter.    acetaminophen (TYLENOL) 500 MG tablet   albuterol (VENTOLIN HFA) 108 (90 Base) MCG/ACT inhaler   apixaban (ELIQUIS) 5 MG TABS tablet   atorvastatin (LIPITOR) 80 MG tablet   azithromycin (ZITHROMAX) 500 MG tablet   Dexlansoprazole 30 MG capsule DR   digoxin (LANOXIN) 0.25 MG tablet   DULoxetine (CYMBALTA) 60 MG capsule   empagliflozin (JARDIANCE) 10 MG TABS tablet   finasteride (PROSCAR) 5 MG tablet   Fluticasone-Umeclidin-Vilant (TRELEGY ELLIPTA) 100-62.5-25 MCG/ACT AEPB   Fluticasone-Umeclidin-Vilant (TRELEGY ELLIPTA) 200-62.5-25 MCG/ACT AEPB   gabapentin (NEURONTIN) 300 MG capsule   gabapentin (NEURONTIN) 600 MG tablet   hydrocortisone 1 % ointment   metoprolol succinate (TOPROL-XL) 25 MG 24 hr tablet   sildenafil (VIAGRA) 50 MG tablet   spironolactone (ALDACTONE) 25 MG tablet   tamsulosin (FLOMAX) 0.4 MG CAPS capsule   varenicline (CHANTIX) 1 MG tablet   History:   Past Medical History:  Diagnosis Date   Aneurysm of descending thoracic aorta without rupture (HCC)    Aortic atherosclerosis (HCC)    Atrial fibrillation (HCC)    a.) CHA2DS2VASc = 7 (age, HFrEF, HTN, CVA x 2, vascular disease, T2DM) as of 11/28/2023; b.) rate/rhythm maintained on oral digoxin + metoprolol succinate; chronically anticoagulated with apixaban   Atypical mycobacterial infection of lung (HCC)    Bipolar disorder (HCC)    BPH (benign prostatic hyperplasia)    CAD (coronary artery disease)    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    Complication of anesthesia    a.) difficulty maintaining adequate sedation/anesthesia; premature emergence; b.) fentanyl makes patient "  mean, grumpy, and difficult"   COPD (chronic obstructive pulmonary disease) (HCC)    CVA (cerebral vascular accident) (HCC)    DDD (degenerative disc disease), cervical    a.)  s/p ACDF C6-C7   Depression    Diverticulosis    Dyspnea    Erectile dysfunction    a.) on PDE5i (sildenafil) PRN   GERD (gastroesophageal reflux disease)    Headache    Hepatitis C virus infection cured after antiviral drug therapy    a.) s/p completed Tx course of ledipasvir-sofosbuvir   HFrEF (heart failure with reduced ejection fraction) (HCC)    History of bilateral cataract extraction 03/2022   History of kidney stones 2016   History of TB (tuberculosis)    HTN (hypertension)    Hyperlipidemia    Infrarenal abdominal aortic aneurysm (AAA) without rupture (HCC)    Myocardial bridge    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   On apixaban therapy    Pericarditis    Peripheral vascular disease (HCC)    Pneumonia    Protein calorie malnutrition (HCC)    RA (rheumatoid arthritis) (HCC)    Smoker    T2DM (type 2 diabetes mellitus) (HCC)    Thoracic scoliosis    Past Surgical History:  Procedure Laterality Date   ABDOMINAL AORTOGRAM W/LOWER EXTREMITY N/A 01/15/2017   Procedure: Abdominal Aortogram w/Lower Extremity;  Surgeon: Renford Dills, MD;  Location: [facility];  Service: Cardiovascular;  Laterality: N/A;   ANTERIOR CERVICAL DECOMP/DISCECTOMY FUSION N/A 2004   FEMORAL-POPLITEAL BYPASS GRAFT Right 06/19/2017   Procedure: BYPASS GRAFT FEMORAL-POPLITEAL ARTERY;  Surgeon: Renford Dills, MD;  Location: [facility];  Service: Vascular;  Laterality: Right;   FEMORAL-POPLITEAL BYPASS GRAFT     had 2 on right leg , 1 on the left   fractured nose repair     as a child   liver abcess removed      LOWER EXTREMITY ANGIOGRAPHY Right 01/15/2017   Procedure: Lower Extremity Angiography;  Surgeon: Renford Dills, MD;  Location: [facility];  Service: Cardiovascular;  Laterality: Right;   LOWER EXTREMITY ANGIOGRAPHY Right 02/13/2017   Procedure: Lower Extremity Angiography;  Surgeon: Renford Dills, MD;  Location: [facility];   Service: Cardiovascular;  Laterality: Right;   VASECTOMY N/A    Family History  Problem Relation Age of Onset   Cancer Mother        liver   Heart disease Father    Diabetes Father    Breast cancer Sister    Brain cancer Sister    Social History   Tobacco Use   Smoking status: Every Day    Current packs/day: 3.00    Average packs/day: 3.0 packs/day for 10.2 years (30.7 ttl pk-yrs)    Types: Cigarettes    Start date: 09/03/2013   Smokeless tobacco: Never   Tobacco comments:    Smoking 4 cigarettes daily 09/20/2023    Smoking 2 -3 daily - 11/26/23  Substance Use Topics   Alcohol use: Not Currently    Alcohol/week: 2.0 standard drinks of alcohol    Types: 2 Shots of liquor per week    Comment: occasional (every 3-4 weeks)    Pertinent Clinical Results:  LABS:  Hospital Outpatient Visit on 11/26/2023  Component Date Value Ref Range Status   WBC 11/26/2023 6.7  4.0 - 10.5 K/uL Final   RBC 11/26/2023 5.04  4.22 - 5.81 MIL/uL Final   Hemoglobin  11/26/2023 15.3  13.0 - 17.0 g/dL Final   HCT 16/06/9603 46.4  39.0 - 52.0 % Final   MCV 11/26/2023 92.1  80.0 - 100.0 fL Final   MCH 11/26/2023 30.4  26.0 - 34.0 pg Final   MCHC 11/26/2023 33.0  30.0 - 36.0 g/dL Final   RDW 54/05/8118 14.9  11.5 - 15.5 % Final   Platelets 11/26/2023 225  150 - 400 K/uL Final   nRBC 11/26/2023 0.0  0.0 - 0.2 % Final   Performed at [facility], [address] [place], [place] [address]   Sodium 11/26/2023 137  135 - 145 mmol/L Final   Potassium 11/26/2023 4.0  3.5 - 5.1 mmol/L Final   Chloride 11/26/2023 100  98 - 111 mmol/L Final   CO2 11/26/2023 26  22 - 32 mmol/L Final   Glucose, Bld 11/26/2023 109 (H)  70 - 99 mg/dL Final   Glucose reference range applies only to samples taken after fasting for at least 8 hours.   BUN 11/26/2023 19  8 - 23 mg/dL Final   Creatinine, Ser 11/26/2023 0.83  0.61 - 1.24 mg/dL Final   Calcium 95/62/1308 9.8  8.9 - 10.3 mg/dL Final   GFR, Estimated  11/26/2023 >60  >60 mL/min Final   Comment: (NOTE) Calculated using the CKD-EPI Creatinine Equation (2021)    Anion gap 11/26/2023 11  5 - 15 Final   Performed at [facility], [address] [place], [place] [address]   MRSA, PCR 11/26/2023 NEGATIVE  NEGATIVE Final   Staphylococcus aureus 11/26/2023 NEGATIVE  NEGATIVE Final   Comment: (NOTE) The Xpert SA Assay (FDA approved for NASAL specimens in patients 4 years of age and older), is one component of a comprehensive surveillance program. It is not intended to diagnose infection nor to guide or monitor treatment. Performed at [facility], [address] [place], [place] [address]    ABO/RH(D) 11/26/2023 A POS   Final   Antibody Screen 11/26/2023 NEG   Final   Sample Expiration 11/26/2023 12/10/2023,2359   Final   Extend sample reason 11/26/2023    Final                   Value:NO TRANSFUSIONS OR PREGNANCY IN THE PAST 3 MONTHS Performed at [facility], [address] [place], [place] [address]     ECG: Date: 11/26/2023 Time ECG obtained: 1049 AM Rate: 71 bpm Rhythm: NSR; LAFB Axis (leads I and aVF): normal Intervals: PR 176 ms. QRS 110 ms. QTc 419 ms. ST segment and T wave changes: No evidence of acute T wave abnormalities or significant ST segment elevation or depression.  Evidence of a possible, age undetermined, prior infarct:  No Comparison: Similar to previous tracing obtained on 06/13/2017.  LAFB now present   IMAGING / PROCEDURES: CT ANGIO CHEST/ABD/PEL FOR DISSECTION W AND/OR W/WO performed on 10/15/2023 Fusiform aneurysmal changes about the mid descending thoracic aorta measuring up to 65 mm in maximum short axis dimension. Cardiothoracic surgery consultation recommended due to increased risk of rupture for arch aneurysm ? 5.5 cm. This recommendation follows 2010 ACCF/AHA/AATS/ACR/ASA/SCA/SCAI/SIR/STS/SVM Guidelines for the Diagnosis and Management of Patients With Thoracic Aortic  Disease. Circulation. 2010; 121: L244-W102. Aortic aneurysm NOS (ICD10-I71.9) Fusiform infrarenal abdominal aortic aneurysm measuring up to 52 mm in maximum short axis dimension. Recommend follow-up CT or MR as appropriate in 6 months and referral to or continued care with vascular specialist. (Ref.: J Vasc Surg. 2018; 67:2-77 and J  Am Coll Radiol 2013;10(10):789-794.) Fusiform aneurysmal dilation of the right common femoral arterial bifurcation with occlusion of the superficial femoral artery and evidence of prior right lower extremity bypass, partially evaluated. Occlusion of the visualized proximal right superficial femoral artery. No acute intrathoracic or intra-abdominal process. Similar appearing severe upper lobe predominant centrilobular emphysema including partially cavitary medial left upper lobe mass, better characterized on recent PET-CT.  NM PET IMAGE INITIAL (PI) SKULL BASE TO THIGH performed on 09/18/2023 The somewhat tubular shaped cavitary process extending posteriorly in the medial left upper lobe has a maximum SUV of 7.6, and extends about 9 cm vertically with components of internal cavitation. Malignant etiology favored, atypical infection is a less likely differential diagnostic consideration. The bilobed nodule in the left upper lobe has a maximum SUV of 2.5, borderline for malignancy. The right upper lobe nodularity is below blood pool and probably benign but meriting surveillance. No hypermetabolic adenopathy in the chest. Fusiform infrarenal abdominal aortic aneurysm 5.1 cm in diameter, new from 11/23/2014 CT scan. There is also a descending thoracic aortic aneurysm at described on prior chest CT. Recommend referral to a vascular specialist. This recommendation follows ACR consensus guidelines: White Paper of the ACR Incidental Findings Committee II on Vascular Findings. J Am Coll Radiol 2013; 10:789-794. Likely partially thrombosed aneurysm or pseudoaneurysm of the right common  femoral artery with rim calcification and connections to a superficial vascular structure which may be venous. Coronary, aortic arch, and branch vessel atherosclerotic vascular disease. Severe emphysema. Mild sigmoid colon diverticulosis. Mild levoconvex thoracic scoliosis.   TRANSTHORACIC ECHOCARDIOGRAM performed on 06/17/2023 Mildly reduced left ventricular systolic function with an EF of 35% Mild asymmetric LVH GLS -12.3% Global hypokinesis Right ventricle mildly enlarged with globally decreased contractility; TAPSE = 1.2 cm Mitral annular calcification noted No significant valvular regurgitation Normal gradients; no valvular stenosis No pericardial effusion Dilated aorta sinus measuring 4.0 cm  MYOCARDIAL PERFUSION IMAGING STUDY (LEXISCAN) performed on 05/30/2017 Normal left ventricular systolic function with a low normal LVEF of 50% Normal myocardial thickening and wall motion Left ventricular cavity size normal SPECT images demonstrate homogenous tracer distribution throughout the myocardium TID ratio = 1.2 No evidence of stress-induced myocardial ischemia or arrhythmia Normal low risk study  Impression and Plan:  Ricardo Haven. has been referred for pre-anesthesia review and clearance prior to him undergoing the planned anesthetic and procedural courses. Available labs, pertinent testing, and imaging results were personally reviewed by me in preparation for upcoming operative/procedural course. [facility] medical record has been updated following extensive record review and patient interview with PAT staff.   This patient has been appropriately cleared by cardiology (ACCEPTABLE) and by pulmonary medicine (MODERATE) with the individually indicated risks of patient experiencing significant cardiovascular/cardiopulmonary complications during his perioperative course.  Based on clinical review performed today (11/28/23), barring any significant acute changes in the patient's  overall condition, it is anticipated that he will be able to proceed with the planned surgical intervention. Any acute changes in clinical condition may necessitate his procedure being postponed and/or cancelled. Patient will meet with anesthesia team (MD and/or CRNA) on the day of his procedure for preoperative evaluation/assessment. Questions regarding anesthetic course will be fielded at that time.   Pre-surgical instructions were reviewed with the patient during his PAT appointment, and questions were fielded to satisfaction by PAT clinical staff. He has been instructed on which medications that he will need to hold prior to surgery, as well as the ones that have been deemed safe/appropriate to  take on the day of his procedure. As part of the general education provided by PAT, patient made aware both verbally and in writing, that he would need to abstain from the use of any illegal substances during his perioperative course. He was advised that failure to follow the provided instructions could necessitate case cancellation or result in serious perioperative complications up to and including death. Patient encouraged to contact PAT and/or his surgeon's office to discuss any questions or concerns that may arise prior to surgery; verbalized understanding.   Quentin Mulling, MSN, APRN, FNP-C, CEN [facility]  Perioperative Services Nurse Practitioner Phone: [phone] Fax: [phone] 11/29/23 13:00 PM  NOTE: This note has been prepared using Dragon dictation software. Despite my best ability to proofread, there is always the potential that unintentional transcriptional errors may still occur from this process.

## 2023-11-28 NOTE — Telephone Encounter (Addendum)
 Ricardo Cervantes (KeyLila Regal) Rx #: [id number] Need Help? Call us  at [phone] Status New (Not sent to plan) Drug Dexlansoprazole  30MG  dr capsules ePA cloud logo Form [facility] Electronic PA Form Original Claim Info [id number] 01SOFT REJECT - PAYER ALLOWS DUR PPS CODE [id number].   Approved until 09/02/24

## 2023-11-28 NOTE — Telephone Encounter (Signed)
 Copied from CRM [id number]. Topic: Clinical - Medication Question >> Nov 28, 2023 11:42 AM Ricardo Cervantes wrote: Reason for CRM: Patient would like to know if he could increase his pantoprazole (PROTONIX) 40 MG tablet  from twice daily to 3 times daily. Patient stated that taking it 3 times daily he doesn't vomit but with taking it twice daily patient still vomits in the evening. Please contact patient at [phone].

## 2023-11-28 NOTE — Progress Notes (Unsigned)
 Subjective:    Patient ID: Ricardo Haven., male    DOB: 1956-04-17, 68 y.o.   MRN: [id number]  Patient Care Team: Lorre Munroe, NP as PCP - General (Internal Medicine)  Chief Complaint  Patient presents with  . Follow-up    Cough, shortness of breath and wheezing.     BACKGROUND/INTERVAL: 68 year old current smoker presents for evaluation of abnormalities noted on lung cancer screening CT.  Patient has a history of ischemic cardiomyopathy, stage III COPD, prior TB treated, atypical Mycobacterium infection and severe peripheral vascular disease.  Was initially evaluated here on 20 September 2023.  HPI    Review of Systems A 10 point review of systems was performed and it is as noted above otherwise negative.   Patient Active Problem List   Diagnosis Date Noted  . AAA (abdominal aortic aneurysm) without rupture (HCC) 09/29/2023  . Multiple lung nodules on CT 09/20/2023  . Aneurysm of descending thoracic aorta without rupture (HCC) 09/20/2023  . Tobacco dependence due to cigarettes 09/20/2023  . Depression 04/12/2023  . Type 2 diabetes mellitus with diabetic peripheral angiopathy without gangrene, without long-term current use of insulin (HCC) 01/24/2023  . Peripheral neuropathy 12/14/2021  . BPH (benign prostatic hyperplasia) 12/14/2021  . GERD (gastroesophageal reflux disease) 07/31/2021  . Stage 3 severe COPD by GOLD classification (HCC) 10/18/2020  . HFrEF (heart failure with reduced ejection fraction) (HCC) 10/18/2020  . History of stroke with residual effects 10/18/2020  . Atypical mycobacterial infection of lung (HCC) 10/18/2020  . Atrial fibrillation (HCC) 09/04/2020  . Hyperlipidemia associated with type 2 diabetes mellitus (HCC) 07/04/2017  . Atherosclerosis of native arteries of extremity with intermittent claudication (HCC) 06/19/2017  . Erectile dysfunction 12/23/2015    Social History   Tobacco Use  . Smoking status: Every Day    Current packs/day:  3.00    Average packs/day: 3.0 packs/day for 10.2 years (30.7 ttl pk-yrs)    Types: Cigarettes    Start date: 09/03/2013  . Smokeless tobacco: Never  . Tobacco comments:    Smoking 4 cigarettes daily 09/20/2023    Smoking 2 -3 daily - 11/26/23  Substance Use Topics  . Alcohol use: Not Currently    Alcohol/week: 2.0 standard drinks of alcohol    Types: 2 Shots of liquor per week    Comment: occasional (every 3-4 weeks)     Allergies  Allergen Reactions  . Chantix [Varenicline] Other (See Comments)    Depression    Current Meds  Medication Sig  . acetaminophen (TYLENOL) 500 MG tablet Take 1,000 mg by mouth every 6 (six) hours as needed.  [name] albuterol (VENTOLIN HFA) 108 (90 Base) MCG/ACT inhaler INHALE 2 PUFFS INTO THE LUNGS EVERY 6 HOURS AS NEEDED FOR WHEEZING OR SHORTNESS OF BREATH.  [name] apixaban (ELIQUIS) 5 MG TABS tablet TAKE 1 TABLET TWICE DAILY  . atorvastatin (LIPITOR) 80 MG tablet TAKE 1 TABLET EVERY DAY  . azithromycin (ZITHROMAX) 500 MG tablet Take 1 tablet (500 mg total) by mouth daily for 3 days. Take 1 tablet daily for 3 days.  . digoxin (LANOXIN) 0.25 MG tablet TAKE 1 TABLET EVERY DAY  . DULoxetine (CYMBALTA) 60 MG capsule TAKE 1 CAPSULE EVERY DAY  . empagliflozin (JARDIANCE) 10 MG TABS tablet TAKE 1 TABLET EVERY DAY  . finasteride (PROSCAR) 5 MG tablet Take 1 tablet (5 mg total) by mouth daily.  . Fluticasone-Umeclidin-Vilant (TRELEGY ELLIPTA) 100-62.5-25 MCG/ACT AEPB INHALE 1 PUFF INTO THE LUNGS DAILY  . Fluticasone-Umeclidin-Vilant (TRELEGY  ELLIPTA) 200-62.5-25 MCG/ACT AEPB Inhale 1 Inhalation into the lungs daily in the afternoon.  . gabapentin (NEURONTIN) 300 MG capsule TAKE 1 CAPSULE (300MG ) AT BEDTIME. IN ADDITION TO 600MG  FOR A TOTAL OF 900MG  AT BEDTIME  . gabapentin (NEURONTIN) 600 MG tablet TAKE 1 TABLET THREE TIMES DAILY  . hydrocortisone 1 % ointment Apply 1 Application topically as needed for itching.  . metoprolol succinate (TOPROL-XL) 25 MG 24 hr tablet TAKE  1 TABLET EVERY DAY  . pantoprazole (PROTONIX) 40 MG tablet Take 1 tablet (40 mg total) by mouth 2 (two) times daily. (Patient taking differently: Take 40 mg by mouth in the morning, at noon, and at bedtime.)  . sildenafil (VIAGRA) 50 MG tablet TAKE 1 TABLET EVERY DAY AS NEEDED FOR ERECTILE DYSFUNCTION  . spironolactone (ALDACTONE) 25 MG tablet Take 25 mg by mouth as needed.  . tamsulosin (FLOMAX) 0.4 MG CAPS capsule Take 1 capsule (0.4 mg total) by mouth daily.  . varenicline (CHANTIX) 1 MG tablet TAKE 1/2 TABLET DAILY FOR 3 DAYS, 1/2 TABLET TWICE DAILY FOR 4 DAYS, THEN TAKE 1 TABLET TWICE DAILY (Patient taking differently: 2 (two) times daily.)    Immunization History  Administered Date(s) Administered  . Influenza-Unspecified 09/16/2020  . Moderna Sars-Covid-2 Vaccination 09/14/2020  . PFIZER Comirnaty(Gray Top)Covid-19 Tri-Sucrose Vaccine 10/12/2020        Objective:     BP 116/70 (BP Location: Left Arm, Patient Position: Sitting, Cuff Size: Normal)   Pulse 72   Temp 97.8 F (36.6 C) (Temporal)   Ht 6\' 2"  (1.88 m)   Wt 167 lb 6.4 oz (75.9 kg)   SpO2 95%   BMI 21.49 kg/m   SpO2: 95 %  GENERAL: Thin, well-developed gentleman in no acute distress.  He ambulates with the assistance of a cane. HEAD: Normocephalic, atraumatic.  EYES: Pupils equal, round, reactive to light.  No scleral icterus.  MOUTH: Poor dentition, upper dentures, partial lower.  Oral mucosa moist.  No thrush NECK: Supple. No thyromegaly. Trachea midline. No JVD.  No adenopathy. PULMONARY: Good air entry bilaterally.  Coarse, otherwise, no adventitious sounds. CARDIOVASCULAR: S1 and S2. Regular rate and rhythm.  No rubs, murmurs or gallops heard. ABDOMEN: Benign. MUSCULOSKELETAL: No joint deformity, no clubbing, no edema.  NEUROLOGIC: No overt focal deficit, gait supported by cane.  Speech is fluent. SKIN: Intact,warm,dry. PSYCH: Mood and behavior normal.        Assessment & Plan:     ICD-10-CM   1.  Stage 3 severe COPD by GOLD classification (HCC)  J44.9     2. Atypical mycobacterial infection of lung (HCC)  A31.0 CT CHEST WO CONTRAST    3. Multiple lung nodules on CT  R91.8 CT CHEST WO CONTRAST    4. Aneurysm of descending thoracic aorta without rupture (HCC)  I71.23     5. HFrEF (heart failure with reduced ejection fraction) (HCC)  I50.20     6. Tobacco dependence due to cigarettes  F17.210       Orders Placed This Encounter  Procedures  . CT CHEST WO CONTRAST    Week of 7 April or thereabouts.    Standing Status:   Future    Expected Date:   12/09/2023    Expiration Date:   11/27/2024    Preferred imaging location?:   [facility]    Meds ordered this encounter  Medications  . Fluticasone-Umeclidin-Vilant (TRELEGY ELLIPTA) 200-62.5-25 MCG/ACT AEPB    Sig: Inhale 1 Inhalation into the lungs daily in the  afternoon.    Lot Number?:   [id number]    Expiration Date?:   01/01/2025    Manufacturer?:   GlaxoSmithKline [[id number]]    NDC:   8295-6213-08 [372260]    Quantity:   2  . azithromycin (ZITHROMAX) 500 MG tablet    Sig: Take 1 tablet (500 mg total) by mouth daily for 3 days. Take 1 tablet daily for 3 days.    Dispense:  3 tablet    Refill:  0       Advised if symptoms do not improve or worsen, to please contact office for sooner follow up or seek emergency care.    I spent xxx minutes of dedicated to the care of this patient on the date of this encounter to include pre-visit review of records, face-to-face time with the patient discussing conditions above, post visit ordering of testing, clinical documentation with the electronic health record, making appropriate referrals as documented, and communicating necessary findings to members of the patients care team.     C. Danice Goltz, MD Advanced Bronchoscopy PCCM [facility]    *This note was generated using voice recognition software/Dragon and/or AI transcription program.  Despite best efforts to  proofread, errors can occur which can change the meaning. Any transcriptional errors that result from this process are unintentional and may not be fully corrected at the time of dictation.

## 2023-11-28 NOTE — Telephone Encounter (Signed)
 Dexilant sent to pharmacy in place of pantoprazole.  Please call [facility] pharmacy and cancel this prescription as I have sent a [place] drug per his request

## 2023-11-28 NOTE — Patient Instructions (Signed)
 VISIT SUMMARY:  Today, we discussed your recent difficulty breathing and reviewed your current treatment plan. We also addressed your concerns about a potential lung infection and your upcoming procedure for the aortic aneurysm.  YOUR PLAN:  -SHORTNESS OF BREATH: Shortness of breath can be caused by various factors, including lung and heart issues. We will increase your Trelegy dosage and provide samples to see if it helps. You will also take azithromycin for three days to reduce inflammation without affecting your blood sugar. Continue using your rescue inhaler as needed.  -POTENTIAL MYCOBACTERIUM AVIUM COMPLEX (MAC) INFECTION: MAC infection is a type of lung infection that can cause coughing and difficulty breathing. We will schedule a chest CT after your procedure on April 2nd to check for this infection.  -AORTIC ANEURYSM: An aortic aneurysm is a bulge in the wall of the aorta that can be dangerous if not treated. You are scheduled for an angioplasty and stent placement on April 2nd to address this issue.  INSTRUCTIONS:  Please follow up with [place] after your scheduled procedure. We will also arrange for a chest CT at your preferred outpatient location after April 2nd. Your next follow-up appointment will be in two months.

## 2023-11-28 NOTE — Addendum Note (Signed)
 Addended by: Lorre Munroe on: 11/28/2023 01:49 PM   Modules accepted: Orders

## 2023-11-28 NOTE — Telephone Encounter (Signed)
 The max dose of this medication is 40 mg 2 times daily.  We could try changing his medication to Dexilant if his insurance would pay for it.  Please let me know if he is agreeable.

## 2023-11-28 NOTE — Telephone Encounter (Signed)
 Spoke with shannon at [facility]. They prescription that was sent there of dexilant has been stopped.

## 2023-11-28 NOTE — Telephone Encounter (Signed)
 Spoke with Dot Lanes at [facility], she put a note in to cancel the prescription but advised to call back and make sure it was cancelled.

## 2023-11-29 ENCOUNTER — Encounter: Payer: Self-pay | Admitting: Vascular Surgery

## 2023-12-03 ENCOUNTER — Telehealth (INDEPENDENT_AMBULATORY_CARE_PROVIDER_SITE_OTHER): Payer: Self-pay | Admitting: Vascular Surgery

## 2023-12-03 NOTE — Telephone Encounter (Signed)
 I contacted the patient and informed him that the 2 physicians that placed the spinal drain to minimize the risk of paralysis are not available tomorrow.  Consequently it is my recommendation that we delay the surgery until we are able to proceed with a spinal drain.  I explained this to the patient.  He is appropriately disappointed but voices understanding and agreement.  We will plan for stent graft placement in 2 weeks Wednesday, April 16.

## 2023-12-04 ENCOUNTER — Telehealth (INDEPENDENT_AMBULATORY_CARE_PROVIDER_SITE_OTHER): Payer: Self-pay

## 2023-12-04 ENCOUNTER — Telehealth: Payer: Self-pay

## 2023-12-04 ENCOUNTER — Other Ambulatory Visit: Payer: Self-pay | Admitting: Internal Medicine

## 2023-12-04 DIAGNOSIS — I7123 Aneurysm of the descending thoracic aorta, without rupture: Secondary | ICD-10-CM

## 2023-12-04 DIAGNOSIS — I712 Thoracic aortic aneurysm, without rupture, unspecified: Secondary | ICD-10-CM

## 2023-12-04 NOTE — Telephone Encounter (Signed)
 Spoke with the patient and went over the rescheduled date and time for his TAA with Dr. Gilda Crease. Patient has been rescheduled to 12/18/23 with a 6:45 am arrival time to the [facility]. Anesthesia has been rescheduled for that time as well, spoke with [facility]

## 2023-12-04 NOTE — Telephone Encounter (Signed)
 I sent Dr Myer Haff a staff message to determine if he going to do this vs Dr Katrinka Blazing. I also sent Mena Goes, RN for OR a secure chat to notify her of the plan.

## 2023-12-05 NOTE — Telephone Encounter (Signed)
 Requested medication (s) are due for refill today: Yes  Requested medication (s) are on the active medication list: No  Last refill:  09/24/23  Future visit scheduled: Yes  Notes to clinic:  Medication not on current medication list      Requested Prescriptions  Pending Prescriptions Disp Refills   pantoprazole (PROTONIX) 40 MG tablet [Pharmacy Med Name: Pantoprazole Sodium Oral Tablet Delayed Release 40 MG] 180 tablet 3    Sig: TAKE 1 TABLET TWICE DAILY     Gastroenterology: Proton Pump Inhibitors Failed - 12/05/2023  1:46 PM      Failed - Valid encounter within last 12 months    Recent Outpatient Visits   None     Future Appointments             In 1 month Baity, Salvadore Oxford, NP [facility]   In 2 months Vanna Scotland, MD [facility] [place]

## 2023-12-09 NOTE — Telephone Encounter (Signed)
 Discussed with Dr Myer Haff and Dr Katrinka Blazing. Dr Katrinka Blazing has agreed to do this

## 2023-12-10 ENCOUNTER — Ambulatory Visit
Admission: RE | Admit: 2023-12-10 | Discharge: 2023-12-10 | Disposition: A | Discharge status: Home / Self Care | Source: Ambulatory Visit | Attending: Pulmonary Disease | Admitting: Pulmonary Disease

## 2023-12-10 DIAGNOSIS — R918 Other nonspecific abnormal finding of lung field: Secondary | ICD-10-CM | POA: Diagnosis present

## 2023-12-10 DIAGNOSIS — A310 Pulmonary mycobacterial infection: Secondary | ICD-10-CM | POA: Insufficient documentation

## 2023-12-11 ENCOUNTER — Other Ambulatory Visit: Payer: Self-pay

## 2023-12-11 NOTE — Telephone Encounter (Signed)
 Notified Dr Wyn Quaker and Dr Gilda Crease that Dr Katrinka Blazing will be placing the drain. Mena Goes, RN has added Dr Katrinka Blazing to the case posting. Order has been placed for consent. Confirmed with Dr Katrinka Blazing he is OK with current orders placed by vascular (CHG bath, cefazolin).

## 2023-12-11 NOTE — Telephone Encounter (Signed)
-----   Message from Lovenia Kim sent at 12/10/2023  3:08 PM EDT ----- Regarding: RE: lumbar drain w/ Dr Gilda Crease Aortic aneurysm for the indication and diagnosis ----- Message ----- From: Sharlot Gowda, RN Sent: 12/10/2023   2:22 PM EDT To: Lovenia Kim, MD Subject: RE: lumbar drain w/ Dr Gilda Crease                 Sorry for another message. What do you want the indication for consent to say for this? And what is the pre-op diagnosis? ----- Message ----- From: Lovenia Kim, MD Sent: 12/10/2023  12:50 PM EDT To: Venetia Night, MD; Sharlot Gowda, RN Subject: RE: lumbar drain w/ Dr Gilda Crease                 I think it [phone] ----- Message ----- From: Sharlot Gowda, RN Sent: 12/10/2023  12:03 PM EDT To: Venetia Night, MD; Lovenia Kim, MD Subject: RE: lumbar drain w/ Dr Gilda Crease                 What is the CPT code for this so I can request authorization? ----- Message ----- From: Lovenia Kim, MD Sent: 12/09/2023   5:16 PM EDT To: Venetia Night, MD; Sharlot Gowda, RN Subject: RE: lumbar drain w/ Dr Brandt Loosen I am happy to help with this.  Can we put a panel on for me so I have a reminder. ----- Message ----- From: Venetia Night, MD Sent: 12/09/2023   7:08 AM EDT To: Lovenia Kim, MD; Sharlot Gowda, RN Subject: RE: lumbar drain w/ Dr Gilda Crease                 Might be easier for Apolinar Junes to do this just with the OR starting ----- Message ----- From: Sharlot Gowda, RN Sent: 12/04/2023   4:30 PM EDT To: Venetia Night, MD Subject: lumbar drain w/ Dr Gilda Crease                     Dr Gilda Crease said he spoke with you about placing a lumbar drain for this patient. His aneurym repair is scheduled for 4/16 at 8am downstairs in special procedures under general anesthesia. You have a case already scheduled that day. Do you want me to push the start time of your case so you can do this (if so, to what time)?  Alternatively, Dr Katrinka Blazing is starting clinic at  [facility] and may be able to do the lumbar drain prior to clinic.

## 2023-12-18 ENCOUNTER — Inpatient Hospital Stay: Payer: Self-pay | Admitting: Urgent Care

## 2023-12-18 ENCOUNTER — Other Ambulatory Visit: Payer: Self-pay

## 2023-12-18 ENCOUNTER — Encounter
Admission: RE | Disposition: A | Discharge status: Home / Self Care | Payer: Self-pay | Source: Home / Self Care | Attending: Vascular Surgery

## 2023-12-18 ENCOUNTER — Encounter: Payer: Self-pay | Admitting: Vascular Surgery

## 2023-12-18 ENCOUNTER — Inpatient Hospital Stay
Admission: RE | Admit: 2023-12-18 | Discharge: 2023-12-19 | DRG: 220 | Disposition: A | Discharge status: Home / Self Care | Attending: Vascular Surgery | Admitting: Vascular Surgery

## 2023-12-18 DIAGNOSIS — Z7901 Long term (current) use of anticoagulants: Secondary | ICD-10-CM | POA: Diagnosis not present

## 2023-12-18 DIAGNOSIS — Z8611 Personal history of tuberculosis: Secondary | ICD-10-CM

## 2023-12-18 DIAGNOSIS — I712 Thoracic aortic aneurysm, without rupture, unspecified: Secondary | ICD-10-CM | POA: Diagnosis present

## 2023-12-18 DIAGNOSIS — N400 Enlarged prostate without lower urinary tract symptoms: Secondary | ICD-10-CM | POA: Diagnosis present

## 2023-12-18 DIAGNOSIS — F319 Bipolar disorder, unspecified: Secondary | ICD-10-CM | POA: Diagnosis present

## 2023-12-18 DIAGNOSIS — Z79899 Other long term (current) drug therapy: Secondary | ICD-10-CM

## 2023-12-18 DIAGNOSIS — I70213 Atherosclerosis of native arteries of extremities with intermittent claudication, bilateral legs: Secondary | ICD-10-CM | POA: Diagnosis present

## 2023-12-18 DIAGNOSIS — H60541 Acute eczematoid otitis externa, right ear: Secondary | ICD-10-CM

## 2023-12-18 DIAGNOSIS — T82310A Breakdown (mechanical) of aortic (bifurcation) graft (replacement), initial encounter: Secondary | ICD-10-CM | POA: Diagnosis not present

## 2023-12-18 DIAGNOSIS — Z7984 Long term (current) use of oral hypoglycemic drugs: Secondary | ICD-10-CM

## 2023-12-18 DIAGNOSIS — Z800 Family history of malignant neoplasm of digestive organs: Secondary | ICD-10-CM

## 2023-12-18 DIAGNOSIS — I7123 Aneurysm of the descending thoracic aorta, without rupture: Principal | ICD-10-CM | POA: Diagnosis present

## 2023-12-18 DIAGNOSIS — E785 Hyperlipidemia, unspecified: Secondary | ICD-10-CM | POA: Diagnosis present

## 2023-12-18 DIAGNOSIS — Z8673 Personal history of transient ischemic attack (TIA), and cerebral infarction without residual deficits: Secondary | ICD-10-CM

## 2023-12-18 DIAGNOSIS — E1151 Type 2 diabetes mellitus with diabetic peripheral angiopathy without gangrene: Secondary | ICD-10-CM | POA: Diagnosis present

## 2023-12-18 DIAGNOSIS — K219 Gastro-esophageal reflux disease without esophagitis: Secondary | ICD-10-CM | POA: Diagnosis present

## 2023-12-18 DIAGNOSIS — I482 Chronic atrial fibrillation, unspecified: Secondary | ICD-10-CM | POA: Diagnosis present

## 2023-12-18 DIAGNOSIS — I5022 Chronic systolic (congestive) heart failure: Secondary | ICD-10-CM | POA: Diagnosis present

## 2023-12-18 DIAGNOSIS — Z808 Family history of malignant neoplasm of other organs or systems: Secondary | ICD-10-CM

## 2023-12-18 DIAGNOSIS — Z981 Arthrodesis status: Secondary | ICD-10-CM

## 2023-12-18 DIAGNOSIS — M069 Rheumatoid arthritis, unspecified: Secondary | ICD-10-CM | POA: Diagnosis present

## 2023-12-18 DIAGNOSIS — I2510 Atherosclerotic heart disease of native coronary artery without angina pectoris: Secondary | ICD-10-CM | POA: Diagnosis present

## 2023-12-18 DIAGNOSIS — Z8249 Family history of ischemic heart disease and other diseases of the circulatory system: Secondary | ICD-10-CM

## 2023-12-18 DIAGNOSIS — N529 Male erectile dysfunction, unspecified: Secondary | ICD-10-CM | POA: Diagnosis present

## 2023-12-18 DIAGNOSIS — I110 Hypertensive heart disease with heart failure: Secondary | ICD-10-CM | POA: Diagnosis present

## 2023-12-18 DIAGNOSIS — Z888 Allergy status to other drugs, medicaments and biological substances status: Secondary | ICD-10-CM | POA: Diagnosis not present

## 2023-12-18 DIAGNOSIS — I7143 Infrarenal abdominal aortic aneurysm, without rupture: Secondary | ICD-10-CM | POA: Diagnosis present

## 2023-12-18 DIAGNOSIS — Z95820 Peripheral vascular angioplasty status with implants and grafts: Secondary | ICD-10-CM

## 2023-12-18 DIAGNOSIS — I444 Left anterior fascicular block: Secondary | ICD-10-CM | POA: Diagnosis present

## 2023-12-18 DIAGNOSIS — J449 Chronic obstructive pulmonary disease, unspecified: Secondary | ICD-10-CM | POA: Diagnosis present

## 2023-12-18 DIAGNOSIS — Z803 Family history of malignant neoplasm of breast: Secondary | ICD-10-CM

## 2023-12-18 DIAGNOSIS — E118 Type 2 diabetes mellitus with unspecified complications: Secondary | ICD-10-CM

## 2023-12-18 DIAGNOSIS — E119 Type 2 diabetes mellitus without complications: Secondary | ICD-10-CM

## 2023-12-18 DIAGNOSIS — Y712 Prosthetic and other implants, materials and accessory cardiovascular devices associated with adverse incidents: Secondary | ICD-10-CM | POA: Diagnosis not present

## 2023-12-18 DIAGNOSIS — Z7951 Long term (current) use of inhaled steroids: Secondary | ICD-10-CM

## 2023-12-18 DIAGNOSIS — F17210 Nicotine dependence, cigarettes, uncomplicated: Secondary | ICD-10-CM | POA: Diagnosis present

## 2023-12-18 DIAGNOSIS — Z833 Family history of diabetes mellitus: Secondary | ICD-10-CM

## 2023-12-18 DIAGNOSIS — Z7982 Long term (current) use of aspirin: Secondary | ICD-10-CM

## 2023-12-18 HISTORY — DX: Other cervical disc degeneration, unspecified cervical region: M50.30

## 2023-12-18 HISTORY — PX: PLACEMENT OF LUMBAR DRAIN: SHX6028

## 2023-12-18 HISTORY — DX: Essential (primary) hypertension: I10

## 2023-12-18 HISTORY — PX: THORACIC AORTIC ENDOVASCULAR STENT GRAFT: CATH118332

## 2023-12-18 HISTORY — DX: Pulmonary mycobacterial infection: A31.0

## 2023-12-18 HISTORY — DX: Scoliosis, unspecified: M41.9

## 2023-12-18 HISTORY — DX: Type 2 diabetes mellitus without complications: E11.9

## 2023-12-18 HISTORY — DX: Malformation of coronary vessels: Q24.5

## 2023-12-18 HISTORY — DX: Atherosclerosis of aorta: I70.0

## 2023-12-18 LAB — BASIC METABOLIC PANEL WITH GFR
Anion gap: 7 (ref 5–15)
BUN: 21 mg/dL (ref 8–23)
CO2: 23 mmol/L (ref 22–32)
Calcium: 8 mg/dL — ABNORMAL LOW (ref 8.9–10.3)
Chloride: 105 mmol/L (ref 98–111)
Creatinine, Ser: 0.7 mg/dL (ref 0.61–1.24)
GFR, Estimated: >60 mL/min (ref >60)
Glucose, Bld: 123 mg/dL — ABNORMAL HIGH (ref 70–99)
Potassium: 3.8 mmol/L (ref 3.5–5.1)
Sodium: 135 mmol/L (ref 135–145)

## 2023-12-18 LAB — APTT
aPTT: >200 s (ref 24–36)
aPTT: 42 s — ABNORMAL HIGH (ref 24–36)

## 2023-12-18 LAB — TYPE AND SCREEN
ABO/RH(D): A POS
Antibody Screen: NEGATIVE

## 2023-12-18 LAB — CBC
HCT: 39.6 % (ref 39.0–52.0)
Hemoglobin: 12.6 g/dL — ABNORMAL LOW (ref 13.0–17.0)
MCH: 31 pg (ref 26.0–34.0)
MCHC: 31.8 g/dL (ref 30.0–36.0)
MCV: 97.5 fL (ref 80.0–100.0)
Platelets: 148 10*3/uL — ABNORMAL LOW (ref 150–400)
RBC: 4.06 MIL/uL — ABNORMAL LOW (ref 4.22–5.81)
RDW: 15.3 % (ref 11.5–15.5)
WBC: 6.3 10*3/uL (ref 4.0–10.5)
nRBC: 0 % (ref 0.0–0.2)

## 2023-12-18 LAB — PROTIME-INR
INR: 1.2 (ref 0.8–1.2)
Prothrombin Time: 15.7 s — ABNORMAL HIGH (ref 11.4–15.2)

## 2023-12-18 LAB — GLUCOSE, CAPILLARY: Glucose-Capillary: 110 mg/dL — ABNORMAL HIGH (ref 70–99)

## 2023-12-18 LAB — MAGNESIUM: Magnesium: 1.9 mg/dL (ref 1.7–2.4)

## 2023-12-18 SURGERY — THORACIC AORTIC ENDOVASCULAR STENT GRAFT (CATHLAB)
Anesthesia: General

## 2023-12-18 MED ORDER — HYDRALAZINE HCL 20 MG/ML IJ SOLN: 5.0000 mg | INTRAMUSCULAR | Status: DC | PRN

## 2023-12-18 MED ORDER — LIDOCAINE HCL (PF) 2 % IJ SOLN
INTRAMUSCULAR | Status: AC
  Filled 2023-12-18: qty 5

## 2023-12-18 MED ORDER — PHENYLEPHRINE 80 MCG/ML (10ML) SYRINGE FOR IV PUSH (FOR BLOOD PRESSURE SUPPORT)
PREFILLED_SYRINGE | INTRAVENOUS | Status: AC
  Filled 2023-12-18: qty 10

## 2023-12-18 MED ORDER — OXYCODONE-ACETAMINOPHEN 5-325 MG PO TABS
1.0000 | ORAL_TABLET | ORAL | Status: DC | PRN
  Administered 2023-12-18 – 2023-12-19 (×2): 1 via ORAL
  Administered 2023-12-19: 2 via ORAL
  Filled 2023-12-18 (×2): qty 1
  Filled 2023-12-18: qty 2

## 2023-12-18 MED ORDER — PROPOFOL 10 MG/ML IV BOLUS
INTRAVENOUS | Status: AC
  Filled 2023-12-18: qty 20

## 2023-12-18 MED ORDER — PHENOL 1.4 % MT LIQD: 1.0000 | OROMUCOSAL | Status: DC | PRN

## 2023-12-18 MED ORDER — DEXMEDETOMIDINE HCL IN NACL 80 MCG/20ML IV SOLN
INTRAVENOUS | Status: DC | PRN
  Administered 2023-12-18: 8 ug via INTRAVENOUS

## 2023-12-18 MED ORDER — GABAPENTIN 300 MG PO CAPS
600.0000 mg | ORAL_CAPSULE | Freq: Three times a day (TID) | ORAL | Status: DC
  Administered 2023-12-18 – 2023-12-19 (×3): 600 mg via ORAL
  Filled 2023-12-18 (×3): qty 2

## 2023-12-18 MED ORDER — LABETALOL HCL 5 MG/ML IV SOLN: 10.0000 mg | INTRAVENOUS | Status: DC | PRN

## 2023-12-18 MED ORDER — DIGOXIN 250 MCG PO TABS
250.0000 ug | ORAL_TABLET | Freq: Every day | ORAL | Status: DC
  Administered 2023-12-19: 250 ug via ORAL
  Filled 2023-12-18: qty 1

## 2023-12-18 MED ORDER — ACETAMINOPHEN 325 MG RE SUPP
325.0000 mg | RECTAL | Status: DC | PRN
  Filled 2023-12-18: qty 2

## 2023-12-18 MED ORDER — NITROGLYCERIN IN D5W 200-5 MCG/ML-% IV SOLN: 5.0000 ug/min | INTRAVENOUS | Status: DC

## 2023-12-18 MED ORDER — HEPARIN SODIUM (PORCINE) 1000 UNIT/ML IJ SOLN
INTRAMUSCULAR | Status: AC
  Filled 2023-12-18: qty 10

## 2023-12-18 MED ORDER — CEFAZOLIN SODIUM-DEXTROSE 2-4 GM/100ML-% IV SOLN
INTRAVENOUS | Status: AC
  Filled 2023-12-18: qty 100

## 2023-12-18 MED ORDER — SODIUM CHLORIDE 0.9 % IV SOLN
500.0000 mL | Freq: Once | INTRAVENOUS | Status: AC | PRN
  Administered 2023-12-18 (×2): 500 mL via INTRAVENOUS

## 2023-12-18 MED ORDER — MORPHINE SULFATE (PF) 4 MG/ML IV SOLN
2.0000 mg | INTRAVENOUS | Status: DC | PRN
  Administered 2023-12-18: 2 mg via INTRAVENOUS
  Filled 2023-12-18 (×2): qty 1

## 2023-12-18 MED ORDER — ATORVASTATIN CALCIUM 80 MG PO TABS: 80.0000 mg | ORAL_TABLET | Freq: Every day | ORAL | Status: DC

## 2023-12-18 MED ORDER — PROPOFOL 10 MG/ML IV BOLUS
INTRAVENOUS | Status: DC | PRN
  Administered 2023-12-18: 120 mg via INTRAVENOUS

## 2023-12-18 MED ORDER — METOPROLOL SUCCINATE ER 50 MG PO TB24
25.0000 mg | ORAL_TABLET | Freq: Every day | ORAL | Status: DC
  Administered 2023-12-19: 25 mg via ORAL
  Filled 2023-12-18: qty 1

## 2023-12-18 MED ORDER — DULOXETINE HCL 60 MG PO CPEP
60.0000 mg | ORAL_CAPSULE | Freq: Every day | ORAL | Status: DC
  Administered 2023-12-19: 60 mg via ORAL
  Filled 2023-12-18: qty 1
  Filled 2023-12-18: qty 2

## 2023-12-18 MED ORDER — MIDAZOLAM HCL 2 MG/2ML IJ SOLN
INTRAMUSCULAR | Status: AC
  Filled 2023-12-18: qty 2

## 2023-12-18 MED ORDER — FENTANYL CITRATE (PF) 100 MCG/2ML IJ SOLN
INTRAMUSCULAR | Status: AC
  Filled 2023-12-18: qty 2

## 2023-12-18 MED ORDER — DEXAMETHASONE SODIUM PHOSPHATE 10 MG/ML IJ SOLN
INTRAMUSCULAR | Status: DC | PRN
  Administered 2023-12-18: 5 mg via INTRAVENOUS

## 2023-12-18 MED ORDER — GUAIFENESIN-DM 100-10 MG/5ML PO SYRP: 15.0000 mL | ORAL_SOLUTION | ORAL | Status: DC | PRN

## 2023-12-18 MED ORDER — MAGNESIUM SULFATE 2 GM/50ML IV SOLN: 2.0000 g | Freq: Every day | INTRAVENOUS | Status: DC | PRN

## 2023-12-18 MED ORDER — BUDESON-GLYCOPYRROL-FORMOTEROL 160-9-4.8 MCG/ACT IN AERO: 2.0000 | INHALATION_SPRAY | Freq: Two times a day (BID) | RESPIRATORY_TRACT | Status: DC

## 2023-12-18 MED ORDER — ACETAMINOPHEN 325 MG PO TABS: 325.0000 mg | ORAL_TABLET | ORAL | Status: DC | PRN

## 2023-12-18 MED ORDER — OXYCODONE HCL 5 MG/5ML PO SOLN
5.0000 mg | Freq: Once | ORAL | Status: DC | PRN
  Filled 2023-12-18: qty 5

## 2023-12-18 MED ORDER — IODIXANOL 320 MG/ML IV SOLN
INTRAVENOUS | Status: DC | PRN
  Administered 2023-12-18: 90 mL

## 2023-12-18 MED ORDER — EPHEDRINE SULFATE-NACL 50-0.9 MG/10ML-% IV SOSY
PREFILLED_SYRINGE | INTRAVENOUS | Status: DC | PRN
  Administered 2023-12-18 (×2): 10 mg via INTRAVENOUS

## 2023-12-18 MED ORDER — CEFAZOLIN SODIUM-DEXTROSE 2-4 GM/100ML-% IV SOLN
2.0000 g | Freq: Three times a day (TID) | INTRAVENOUS | Status: AC
  Administered 2023-12-18 – 2023-12-19 (×3): 2 g via INTRAVENOUS
  Filled 2023-12-18 (×2): qty 100

## 2023-12-18 MED ORDER — ONDANSETRON HCL 4 MG/2ML IJ SOLN: 4.0000 mg | Freq: Four times a day (QID) | INTRAMUSCULAR | Status: DC | PRN

## 2023-12-18 MED ORDER — FAMOTIDINE IN NACL 20-0.9 MG/50ML-% IV SOLN: 20.0000 mg | Freq: Two times a day (BID) | INTRAVENOUS | Status: DC

## 2023-12-18 MED ORDER — ALUM & MAG HYDROXIDE-SIMETH 200-200-20 MG/5ML PO SUSP: 15.0000 mL | ORAL | Status: DC | PRN

## 2023-12-18 MED ORDER — PHENYLEPHRINE HCL-NACL 20-0.9 MG/250ML-% IV SOLN
INTRAVENOUS | Status: DC | PRN
  Administered 2023-12-18: 20 ug/min via INTRAVENOUS

## 2023-12-18 MED ORDER — CHLORHEXIDINE GLUCONATE CLOTH 2 % EX PADS: 6.0000 | MEDICATED_PAD | Freq: Once | CUTANEOUS | Status: AC

## 2023-12-18 MED ORDER — LACTATED RINGERS IV SOLN: INTRAVENOUS | Status: DC

## 2023-12-18 MED ORDER — LIDOCAINE-EPINEPHRINE (PF) 2 %-1:200000 IJ SOLN
INTRAMUSCULAR | Status: DC | PRN
  Administered 2023-12-18: 5 mL via INTRADERMAL

## 2023-12-18 MED ORDER — CHLORHEXIDINE GLUCONATE 0.12 % MT SOLN
15.0000 mL | Freq: Once | OROMUCOSAL | Status: AC
  Administered 2023-12-18: 15 mL via OROMUCOSAL
  Filled 2023-12-18 (×2): qty 15

## 2023-12-18 MED ORDER — ACETAMINOPHEN 10 MG/ML IV SOLN
1000.0000 mg | Freq: Once | INTRAVENOUS | Status: DC | PRN
  Administered 2023-12-18: 1000 mg via INTRAVENOUS

## 2023-12-18 MED ORDER — ASPIRIN 81 MG PO TBEC
81.0000 mg | DELAYED_RELEASE_TABLET | Freq: Every day | ORAL | Status: DC
  Administered 2023-12-19: 81 mg via ORAL
  Filled 2023-12-18: qty 1

## 2023-12-18 MED ORDER — CHLORHEXIDINE GLUCONATE CLOTH 2 % EX PADS
6.0000 | MEDICATED_PAD | Freq: Once | CUTANEOUS | Status: AC
  Administered 2023-12-18: 6 via TOPICAL

## 2023-12-18 MED ORDER — PHENYLEPHRINE HCL-NACL 20-0.9 MG/250ML-% IV SOLN
INTRAVENOUS | Status: AC
  Filled 2023-12-18: qty 250

## 2023-12-18 MED ORDER — SODIUM CHLORIDE 0.9% FLUSH: 3.0000 mL | Freq: Two times a day (BID) | INTRAVENOUS | Status: DC

## 2023-12-18 MED ORDER — SORBITOL 70 % SOLN: 30.0000 mL | Freq: Every day | Status: DC | PRN

## 2023-12-18 MED ORDER — MIDAZOLAM HCL 2 MG/2ML IJ SOLN
INTRAMUSCULAR | Status: DC | PRN
  Administered 2023-12-18 (×2): 1 mg via INTRAVENOUS

## 2023-12-18 MED ORDER — MAGNESIUM CITRATE PO SOLN: 1.0000 | Freq: Once | ORAL | Status: DC | PRN

## 2023-12-18 MED ORDER — CHLORHEXIDINE GLUCONATE CLOTH 2 % EX PADS
6.0000 | MEDICATED_PAD | Freq: Every day | CUTANEOUS | Status: DC
  Administered 2023-12-18: 6 via TOPICAL

## 2023-12-18 MED ORDER — EPHEDRINE 5 MG/ML INJ
INTRAVENOUS | Status: AC
  Filled 2023-12-18: qty 5

## 2023-12-18 MED ORDER — TAMSULOSIN HCL 0.4 MG PO CAPS
0.4000 mg | ORAL_CAPSULE | Freq: Every day | ORAL | Status: DC
  Administered 2023-12-19: 0.4 mg via ORAL
  Filled 2023-12-18: qty 1

## 2023-12-18 MED ORDER — LIDOCAINE HCL (CARDIAC) PF 100 MG/5ML IV SOSY
PREFILLED_SYRINGE | INTRAVENOUS | Status: DC | PRN
  Administered 2023-12-18: 80 mg via INTRAVENOUS

## 2023-12-18 MED ORDER — HYDROMORPHONE HCL 1 MG/ML IJ SOLN: 1.0000 mg | Freq: Once | INTRAMUSCULAR | Status: DC | PRN

## 2023-12-18 MED ORDER — OXYCODONE HCL 5 MG PO TABS: 5.0000 mg | ORAL_TABLET | Freq: Once | ORAL | Status: DC | PRN

## 2023-12-18 MED ORDER — ACETAMINOPHEN 10 MG/ML IV SOLN
INTRAVENOUS | Status: AC
  Filled 2023-12-18: qty 100

## 2023-12-18 MED ORDER — ROCURONIUM BROMIDE 100 MG/10ML IV SOLN
INTRAVENOUS | Status: DC | PRN
  Administered 2023-12-18: 20 mg via INTRAVENOUS
  Administered 2023-12-18: 50 mg via INTRAVENOUS

## 2023-12-18 MED ORDER — POLYETHYLENE GLYCOL 3350 17 G PO PACK: 17.0000 g | PACK | Freq: Every day | ORAL | Status: DC | PRN

## 2023-12-18 MED ORDER — ONDANSETRON HCL 4 MG/2ML IJ SOLN
INTRAMUSCULAR | Status: DC | PRN
  Administered 2023-12-18: 4 mg via INTRAVENOUS

## 2023-12-18 MED ORDER — HEPARIN SODIUM (PORCINE) 1000 UNIT/ML IJ SOLN
INTRAMUSCULAR | Status: DC | PRN
  Administered 2023-12-18: 7000 [IU] via INTRAVENOUS

## 2023-12-18 MED ORDER — FINASTERIDE 5 MG PO TABS
5.0000 mg | ORAL_TABLET | Freq: Every day | ORAL | Status: DC
  Administered 2023-12-18 – 2023-12-19 (×2): 5 mg via ORAL
  Filled 2023-12-18 (×2): qty 1

## 2023-12-18 MED ORDER — POTASSIUM CHLORIDE CRYS ER 20 MEQ PO TBCR: 20.0000 meq | EXTENDED_RELEASE_TABLET | Freq: Every day | ORAL | Status: DC | PRN

## 2023-12-18 MED ORDER — DOCUSATE SODIUM 100 MG PO CAPS: 100.0000 mg | ORAL_CAPSULE | Freq: Every day | ORAL | Status: DC

## 2023-12-18 MED ORDER — FENTANYL CITRATE (PF) 100 MCG/2ML IJ SOLN
25.0000 ug | INTRAMUSCULAR | Status: DC | PRN
  Administered 2023-12-18 (×2): 50 ug via INTRAVENOUS

## 2023-12-18 MED ORDER — ALBUTEROL SULFATE (2.5 MG/3ML) 0.083% IN NEBU: 2.5000 mg | INHALATION_SOLUTION | Freq: Four times a day (QID) | RESPIRATORY_TRACT | Status: DC | PRN

## 2023-12-18 MED ORDER — SUGAMMADEX SODIUM 200 MG/2ML IV SOLN
INTRAVENOUS | Status: DC | PRN
  Administered 2023-12-18: 200 mg via INTRAVENOUS

## 2023-12-18 MED ORDER — ORAL CARE MOUTH RINSE: 15.0000 mL | Freq: Once | OROMUCOSAL | Status: AC

## 2023-12-18 MED ORDER — BUDESON-GLYCOPYRROL-FORMOTEROL 160-9-4.8 MCG/ACT IN AERO
2.0000 | INHALATION_SPRAY | Freq: Two times a day (BID) | RESPIRATORY_TRACT | Status: DC
  Administered 2023-12-18 – 2023-12-19 (×2): 2 via RESPIRATORY_TRACT
  Filled 2023-12-18: qty 5.9

## 2023-12-18 MED ORDER — SODIUM CHLORIDE 0.9 % IV SOLN: INTRAVENOUS | Status: DC

## 2023-12-18 MED ORDER — CEFAZOLIN SODIUM-DEXTROSE 2-4 GM/100ML-% IV SOLN
2.0000 g | INTRAVENOUS | Status: AC
  Administered 2023-12-18: 2 g via INTRAVENOUS

## 2023-12-18 MED ORDER — SODIUM CHLORIDE 0.9% FLUSH: 3.0000 mL | INTRAVENOUS | Status: DC | PRN

## 2023-12-18 MED ORDER — PROPOFOL 1000 MG/100ML IV EMUL
INTRAVENOUS | Status: AC
  Filled 2023-12-18: qty 100

## 2023-12-18 MED ORDER — METOPROLOL TARTRATE 5 MG/5ML IV SOLN: 2.0000 mg | INTRAVENOUS | Status: DC | PRN

## 2023-12-18 MED ORDER — FAMOTIDINE 20 MG PO TABS
20.0000 mg | ORAL_TABLET | Freq: Two times a day (BID) | ORAL | Status: DC
  Administered 2023-12-18 – 2023-12-19 (×2): 20 mg via ORAL
  Filled 2023-12-18 (×2): qty 1

## 2023-12-18 MED ORDER — DOPAMINE-DEXTROSE 3.2-5 MG/ML-% IV SOLN: 3.0000 ug/kg/min | INTRAVENOUS | Status: DC

## 2023-12-18 MED ORDER — HEPARIN (PORCINE) IN NACL 1000-0.9 UT/500ML-% IV SOLN
INTRAVENOUS | Status: DC | PRN
  Administered 2023-12-18: 1000 mL

## 2023-12-18 SURGICAL SUPPLY — 36 items
BRUSH SCRUB EZ 4% CHG (MISCELLANEOUS) IMPLANT
CATH ACCU-VU SIZ PIG 5F 100CM (CATHETERS) IMPLANT
CATH BALLN TRILOBE 26-42 (BALLOONS) IMPLANT
CATH LUMBAR HERMETIC 14G (CATHETERS) IMPLANT
CATHETER LUMBAR HERMETIC 14G (CATHETERS) ×1 IMPLANT
CHLORAPREP W/TINT 26 (MISCELLANEOUS) IMPLANT
CLOSURE PERCLOSE PROSTYLE (VASCULAR PRODUCTS) IMPLANT
COVER DRAPE FLUORO 36X44 (DRAPES) IMPLANT
COVER PROBE ULTRASOUND 5X96 (MISCELLANEOUS) IMPLANT
DERMABOND ADVANCED .7 DNX12 (GAUZE/BANDAGES/DRESSINGS) IMPLANT
DEVICE SAFEGUARD 24CM (GAUZE/BANDAGES/DRESSINGS) IMPLANT
DEVICE STARCLOSE SE CLOSURE (Vascular Products) IMPLANT
DRSG TEGADERM 4X4.75 (GAUZE/BANDAGES/DRESSINGS) IMPLANT
DRSG TEGADERM 6X6 (GAUZE/BANDAGES/DRESSINGS) IMPLANT
ELECT REM PT RETURN 9FT ADLT (ELECTROSURGICAL) ×1 IMPLANT
ELECTRODE REM PT RTRN 9FT ADLT (ELECTROSURGICAL) IMPLANT
GAUZE SPONGE 4X4 12PLY STRL (GAUZE/BANDAGES/DRESSINGS) IMPLANT
GLIDEWIRE ANGLED SS 035X260CM (WIRE) IMPLANT
GOWN STRL REUS W/ TWL LRG LVL3 (GOWN DISPOSABLE) IMPLANT
GOWN STRL REUS W/TWL 2XL LVL3 (GOWN DISPOSABLE) IMPLANT
KIT MICROPUNCTURE VSI 5F STIFF (SHEATH) IMPLANT
PACK ANGIOGRAPHY (CUSTOM PROCEDURE TRAY) ×1 IMPLANT
PACK BASIN MAJOR ARMC (MISCELLANEOUS) IMPLANT
SHEATH BRITE TIP 6FRX11 (SHEATH) IMPLANT
SHEATH BRITE TIP 8FRX11 (SHEATH) IMPLANT
SHEATH DRYSEAL FLEX 22FR 33CM (SHEATH) IMPLANT
STENT GRFT THORAC ACS 34X34X15 (Endovascular Graft) IMPLANT
STENT GRFT THORAC ACS 40X40X15 (Endovascular Graft) IMPLANT
STENT GRFT THORAC ACS 40X40X20 (Endovascular Graft) IMPLANT
SUT MNCRL+ 5-0 UNDYED PC-3 (SUTURE) IMPLANT
SUT PROLENE 6 0 BV (SUTURE) IMPLANT
SUT VICRYL+ 3-0 36IN CT-1 (SUTURE) IMPLANT
TRAY FOLEY SLVR 16FR LF STAT (SET/KITS/TRAYS/PACK) IMPLANT
TUBING CONTRAST HIGH PRESS 72 (TUBING) IMPLANT
WIRE G LUND 35X260X7 (WIRE) IMPLANT
WIRE J 3MM .035X145CM (WIRE) IMPLANT

## 2023-12-18 NOTE — Anesthesia Preprocedure Evaluation (Addendum)
 Anesthesia Evaluation  Patient identified by MRN, date of birth, ID band Patient awake    Reviewed: Allergy & Precautions, NPO status , Patient's Chart, lab work & pertinent test results  History of Anesthesia Complications Negative for: history of anesthetic complications  Airway Mallampati: III   Neck ROM: Full    Dental  (+) Missing   Pulmonary COPD, Current Smoker (2-3 cigarettes per day)Patient did not abstain from smoking.   Pulmonary exam normal breath sounds clear to auscultation       Cardiovascular hypertension, + Peripheral Vascular Disease (s/p fem pop bypass) and +CHF (EF 45%)  Normal cardiovascular exam Rhythm:Regular Rate:Normal  ECG 11/26/23:  Normal sinus rhythm Left anterior fascicular block Cannot rule out Anteroseptal infarct , age undetermined  Echo 11/26/23:  MILD LEFT VENTRICULAR SYSTOLIC DYSFUNCTION WITH MILD LVH  ESTIMATED EF: 45%, CALC EF(2D): 45%  NORMAL LA PRESSURES WITH DIASTOLIC DYSFUNCTION (GRADE 1)  NORMAL RIGHT VENTRICULAR SYSTOLIC FUNCTION  VALVULAR REGURGITATION: No AR, TRIVIAL MR, No PR, TRIVIAL TR  NO VALVULAR STENOSIS  EF 45%    Neuro/Psych  Headaches PSYCHIATRIC DISORDERS  Depression Bipolar Disorder   CVA, No Residual Symptoms    GI/Hepatic ,GERD  ,,(+) Hepatitis -, C  Endo/Other  diabetes, Type 2    Renal/GU Renal disease (nephrolithiasis)   BPH    Musculoskeletal  (+) Arthritis , Osteoarthritis and Rheumatoid disorders,    Abdominal   Peds  Hematology   Anesthesia Other Findings Cardiology note 12/10/23:  Assessment & Plan  Pruritus Reports significant itching, particularly on the arms, severe enough to cause skin damage from scratching. Itching is most intense at night and in the morning. Suspects it may be related to shingles, although the current location of the itching differs from previous shingles outbreaks. Has been using cortisone cream, which provides temporary  relief. - Consider evaluation for shingles if symptoms persist or worsen - Continue using cortisone cream for symptomatic relief  Heart failure with reduced ejection fraction (HFrEF) Heart function has improved, with ejection fraction increasing from 35% to 45%. This is a positive development, as 50-55% is considered optimal. All heart valves appear to be functioning well. - Continue current heart failure management regimen - Schedule follow-up appointment in three months to reassess heart function  Thoracic aortic aneurysm Scheduled for surgery next week to address the thoracic aortic aneurysm. Surgery was delayed due to the unavailability of a specialist. Recovery time is anticipated to be at least one month. - Proceed with scheduled surgery for thoracic aortic aneurysm  Travel and medication management Plans to travel after recovery from surgery and is concerned about medication availability and costs abroad. Intends to return in three months. Discussed the high cost of shipping medications and other items overseas. - Schedule follow-up appointment before or after the trip, depending on recovery and travel plans    Reproductive/Obstetrics                             Anesthesia Physical Anesthesia Plan  ASA: 3  Anesthesia Plan: General   Post-op Pain Management:    Induction: Intravenous  PONV Risk Score and Plan: 1 and Ondansetron, Dexamethasone and Treatment may vary due to age or medical condition  Airway Management Planned: Oral ETT  Additional Equipment:   Intra-op Plan:   Post-operative Plan: Extubation in OR  Informed Consent: I have reviewed the patients History and Physical, chart, labs and discussed the procedure including the risks, benefits and  alternatives for the proposed anesthesia with the patient or authorized representative who has indicated his/her understanding and acceptance.     Dental advisory given  Plan Discussed with:  CRNA  Anesthesia Plan Comments: (Patient consented for risks of anesthesia including but not limited to:  - adverse reactions to medications - damage to eyes, teeth, lips or other oral mucosa - nerve damage due to positioning  - sore throat or hoarseness - damage to heart, brain, nerves, lungs, other parts of body or loss of life  Informed patient about role of CRNA in peri- and intra-operative care.  Patient voiced understanding.)        Anesthesia Quick Evaluation

## 2023-12-18 NOTE — Op Note (Signed)
 Indications: Ricardo Cervantes. is suffering from aortic aneurysm.  He is going in today for percutaneous intervention.  At the request of our vascular team a lumbar drain was planned for prophylactic spinal cord decompression should he have any neurologic changes after the stenting.  Findings: Steady flow of clear CSF out the catheter once it was placed  Preoperative Diagnosis: Thoracic aortic aneurysm Postoperative Diagnosis: same   EBL: Minimal IVF: See anesthesia report Drains: Lumbar drain placement Disposition: Extubated and moving onto his IR procedure Complications: none  A foley catheter was placed.   Preoperative Note:   Risks of surgery discussed include: infection, bleeding, stroke, coma, death, paralysis, CSF leak, weakness, need for further surgery, persistent symptoms, and the risks of anesthesia. The patient understood these risks and agreed to proceed.  Operative Note:   Procedure:  1)  left sided lumbar drain placemen   Procedure: After obtaining informed consent, the patient taken to the operating room, placed in lateral position after general anesthesia was induced.  He was padded with his pressure points examined and padded as well.  His back was prepped and draped in the usual fashion.  A comprehensive timeout was performed.   The entry point was injected with local anesthetic.  The needle was advanced until I could clearly palpate the lamina, I then slowly worked my way up until I found the interlaminar space and then advanced the needle until I felt give way of the dura.  When I removed the inner catheter the CSF was flowing clearly with no evidence of hemorrhage.  I then placed the lumbar drain catheter through the needle and slowly advance it until it was approximately 15 cm out of the needle.  There was no strong resistance.  The CSF continue to flow cleanly.  The needle was removed and the lumbar drain was secured into place at the exit site and a tension  relieving stitch on the left flank.  A sterile dressing was placed.  The catheter was again tested and showed clean fluid clearly draining from the end of the tubing.  This was then hooked up to the buretrol in a sterile fashion.  The system was clamped and inspected.   Sponge and pattie counts were correct at the end of the procedure.   Carroll Clamp, MD

## 2023-12-18 NOTE — Op Note (Signed)
 OPERATIVE NOTE   PROCEDURE: US guidance for vascular access, bilateral femoral arteries Catheter placement into the thoracic aorta from bilateral femoral approaches Placement of a Gore TAG conformable thoracic stent graft in the descending thoracic aorta, with a total of 3 aortic stent graft components ProGlide closure devices left femoral artery and placement of a StarClose device right femoral artery  PRE-OPERATIVE DIAGNOSIS: TAA  POST-OPERATIVE DIAGNOSIS: same  SURGEON: Levora Dredge, MD and Festus Barren, MD - Co-surgeons  ANESTHESIA: General anesthesia  ESTIMATED BLOOD LOSS: 50 cc  FINDING(S): 1.  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 is a 68 y.o. y.o. 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  The anatomy is suitable for endovascular repair.  Risks and benefits for repair of the thoracic aortic aneurysm using an endograft was described in detail to the patient all questions have been answered patient agrees to proceed.  Co-surgeons are utilized to expedite the procedure and reduce operative time improving patient's safety, improving patient outcome.  DESCRIPTION: After obtaining full informed written consent, the patient was brought back to the operating room and placed supine upon the operating table.  The patient received IV antibiotics prior to induction.  After obtaining adequate anesthesia, the patient was prepped and draped in the standard fashion for endovascular TAA repair.  Co-surgeons are required because this is a complex bilateral procedure with work being performed simultaneously from both the right femoral and left femoral approach.  This also expedites the procedure making a shorter operative time reducing complications and improving patient safety.  We then began by gaining access to both  femoral arteries with US guidance with Dr. Gilda Crease working on the left and myself working on the right.  The femoral arteries were found to be patent and accessed without difficulty with a needle under ultrasound guidance without difficulty on each side and permanent images were recorded.  We then placed 2 proglide devices on the left side in a pre-close fashion and placed 8 French sheath.  On the right side a 6 French sheath was placed with the plan for StarClose closure at the end of the case  The patient was then given  7000 units of intravenous heparin.   The Pigtail catheter was placed into the aorta from the right side. Using this image, we selected a 34 mm x 20 cm Gore tag conformable stent graft.  Over a Lunderquist wire, an 70 French sheath was placed up the left side. The 34 mm x 20 cm Gore tag conformable stent graft was then placed through the 22 [place] sheath. A pigtail catheter was placed up the right side and a magnified image of the thoracic aorta was performed.  The pigtail catheter was then pulled back and the initial Gore tag conformable stent graft was then deployed.  The pigtail catheter advanced through the stent and positioned in the arch.  Repeat arch aortography was performed and an additional Gore tag conformable stent graft was  needed to completely exclude the aneurysm.  Based on the measurements a 40 mm diameter x 20 cm length Gore tag conformable stent graft selected and then advanced through the 22 [place] sheath.  The pigtail catheter was repositioned and the stent deployed.  A Gore trilobed balloon was then used to fully expand the stent to ensure adequate seal.  The pigtail  catheter was then replaced and a completion angiogram was performed.   A proximal type I endoleak was detected on completion angiography.  An additional 40 mm diameter by 15 cm length Gore tag conformable stent graft was selected and deployed proximally.  This was taken out about 5 cm superior to the initial  stent graft placement in an area that was clearly normal just beyond the left subclavian artery.  The Gore trilobed balloon was again introduced and hand inflations were performed.  Follow-up imaging was obtained demonstrating resolution of the endoleak.  There was excellent flow through the stent graft.  At this point we elected to terminate the procedure. We secured the pro glide devices for hemostasis on the femoral artery and a StarClose device deployed on the opposite side. The skin incision was closed with a 4-0 Monocryl. Dermabond and pressure dressing were placed. The patient was taken to the recovery room in stable condition having tolerated the procedure well.  COMPLICATIONS: none  CONDITION: stable  Ricardo Cervantes  12/18/2023, 3:34 PM

## 2023-12-18 NOTE — Interval H&P Note (Signed)
 History and Physical Interval Note:  12/18/2023 12:06 PM  Ricardo Cervantes.  has presented today for surgery, with the diagnosis of AAA   GORE    ANESTHESIA   Thoracic aortic aneurysm.  The various methods of treatment have been discussed with the patient and family. After consideration of risks, benefits and other options for treatment, the patient has consented to  Procedure(s): THORACIC AORTIC ENDOVASCULAR STENT GRAFT (N/A) PLACEMENT OF LUMBAR DRAIN (N/A) as a surgical intervention.  The patient's history has been reviewed, patient examined, no change in status, stable for surgery.  I have reviewed the patient's chart and labs.  Questions were answered to the patient's satisfaction.     [name]

## 2023-12-18 NOTE — Transfer of Care (Signed)
 Immediate Anesthesia Transfer of Care Note  Patient: Ricardo Cervantes.  Procedure(s) Performed: THORACIC AORTIC ENDOVASCULAR STENT GRAFT PLACEMENT OF LUMBAR DRAIN  Patient Location: PACU  Anesthesia Type:General  Level of Consciousness: awake, alert , oriented, and patient cooperative  Airway & Oxygen Therapy: Patient Spontanous Breathing and Patient connected to face mask oxygen  Post-op Assessment: Report given to RN, Post -op Vital signs reviewed and stable, and Patient moving all extremities X 4  Post vital signs: Reviewed and stable  Last Vitals:  Vitals Value Taken Time  BP 109/59 12/18/23 1430  Temp 36.2 C 12/18/23 1423  Pulse 54 12/18/23 1432  Resp 15 12/18/23 1432  SpO2 100 % 12/18/23 1432  Vitals shown include unfiled device data.  Last Pain:  Vitals:   12/18/23 0731  TempSrc: Oral  PainSc: 6       Patients Stated Pain Goal: 0 (12/18/23 0731)  Complications:  Encounter Notable Events  Notable Event Outcome Phase Comment  None  Intraprocedure

## 2023-12-18 NOTE — Anesthesia Procedure Notes (Signed)
 Procedure Name: Intubation Date/Time: 12/18/2023 11:50 AM  Performed by: Wilkins Hardy I, CRNAPre-anesthesia Checklist: Patient identified, Patient being monitored, Timeout performed, Emergency Drugs available and Suction available Patient Re-evaluated:Patient Re-evaluated prior to induction Oxygen Delivery Method: Circle system utilized Preoxygenation: Pre-oxygenation with 100% oxygen Induction Type: IV induction Ventilation: Mask ventilation without difficulty Laryngoscope Size: McGrath and 4 Grade View: Grade I Tube type: Oral Tube size: 7.5 mm Number of attempts: 1 Airway Equipment and Method: Stylet Placement Confirmation: ETT inserted through vocal cords under direct vision, positive ETCO2 and breath sounds checked- equal and bilateral Secured at: 21 cm Tube secured with: Tape Dental Injury: Teeth and Oropharynx as per pre-operative assessment

## 2023-12-18 NOTE — Anesthesia Postprocedure Evaluation (Signed)
 Anesthesia Post Note  Patient: Ricardo E Cajamarca Jr.  Procedure(s) Performed: THORACIC AORTIC ENDOVASCULAR STENT GRAFT PLACEMENT OF LUMBAR DRAIN  Patient location during evaluation: PACU Anesthesia Type: General Level of consciousness: awake and alert, oriented and patient cooperative Pain management: pain level controlled Vital Signs Assessment: post-procedure vital signs reviewed and stable Respiratory status: spontaneous breathing, nonlabored ventilation and respiratory function stable Cardiovascular status: blood pressure returned to baseline and stable Postop Assessment: adequate PO intake Anesthetic complications: no   Encounter Notable Events  Notable Event Outcome Phase Comment  None  Intraprocedure      Last Vitals:  Vitals:   12/18/23 1430 12/18/23 1445  BP: (!) 109/59 (!) 93/55  Pulse: (!) 55 (!) 55  Resp: 15 13  Temp:    SpO2: 100% 94%    Last Pain:  Vitals:   12/18/23 1445  TempSrc:   PainSc: 0-No pain                 Dorothey Gate

## 2023-12-18 NOTE — Op Note (Signed)
 OPERATIVE NOTE   PROCEDURE: US guidance for vascular access, bilateral femoral arteries Catheter placement into the thoracic aorta from bilateral femoral approaches Placement of a Gore TAG conformable thoracic stent graft in the descending thoracic aorta, using a 34 x 34 x 15 stent with a 40 x 40 x 20 stent and an additional 40 x 40 x 15 stent components ProGlide closure devices left femoral artery and placement of a StarClose device right femoral artery  PRE-OPERATIVE DIAGNOSIS: TAA  POST-OPERATIVE DIAGNOSIS: same  SURGEON: Levora Dredge, MD and Festus Barren, MD - Co-surgeons  ANESTHESIA: General anesthesia  ESTIMATED BLOOD LOSS: 50 cc  FINDING(S): 1.  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 is a 68 y.o. y.o. 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  The anatomy is suitable for endovascular repair.  Risks and benefits for repair of the thoracic aortic aneurysm using an endograft was described in detail to the patient all questions have been answered patient agrees to proceed.  Co-surgeons are utilized to expedite the procedure and reduce operative time improving patient's safety, improving patient outcome.  DESCRIPTION: After obtaining full informed written consent, the patient was brought back to the operating room and placed supine upon the operating table.  The patient received IV antibiotics prior to induction.  After obtaining adequate anesthesia, the patient was prepped and draped in the standard fashion for endovascular TAA repair.  Co-surgeons are required because this is a complex bilateral procedure with work being performed simultaneously from both the right femoral and left femoral approach.  This also expedites the procedure making a shorter operative time reducing complications and improving patient  safety.  We then began by gaining access to both femoral arteries with US guidance with me working on the patient's left and Dr. Wyn Quaker working on the patient's right.  The femoral arteries were found to be patent and accessed without difficulty with a needle under ultrasound guidance without difficulty on each side and permanent images were recorded.  We then placed 2 proglide devices on the left side in a pre-close fashion and placed 8 French sheath.  On the right side a 6 French sheath was placed with the plan for StarClose closure at the end of the case  The patient was then given 7000 units of intravenous heparin.   The Pigtail catheter was placed into the aorta from the right side. Using this image, we selected a 34 mm x 34 mm x 15 cm Gore tag conformable stent graft.  Over a Lunderquist wire, an 20 French sheath was placed up the left side. The 34 mm x 34 mm x 15 cm  Gore tag conformable stent graft was then placed through the 22 [place] sheath. A pigtail catheter was placed up the right side and a magnified image of the thoracic aorta was performed at the level of the visceral segment identifying the celiac artery.  The stent graft was then positioned with its distal edge just above the celiac artery.  The pigtail catheter was then pulled back and the 34 mm x 34 mm x 15 cm Gore tag conformable stent graft was then deployed.  The pigtail catheter advanced through the stent and positioned in the arch.  Repeat arch aortography was performed and an additional Gore tag conformable stent graft was needed to completely exclude the  aneurysm.  Based on the measurements a 40 mm x 40 mm x 20 cm Gore tag conformable stent graft selected and then advanced through the 22 [place] sheath.  It was positioned in the proximal descending thoracic aorta and deployed after the pigtail catheter was repositioned and the stent deployed without difficulty.  A Gore trilobed balloon was then used to fully expand the stent to ensure  adequate seal.  The pigtail catheter was then replaced into the proximal thoracic aorta and a completion angiogram was performed.   An Endoleak was detected on completion angiography.  A 40 mm x 40 mm x 15 cm Gore tag conformable stent graft was selected and deployed proximally to extend the repair and create a seal.  The Gore trilobed balloon was again introduced and hand inflations were performed.  Follow-up imaging was obtained demonstrating complete resolution of the endoleak.  At this point having successfully excluded the thoracic aneurysm we elected to terminate the procedure. We secured the pro glide devices for hemostasis on the femoral artery and a StarClose device deployed on the opposite side. The skin incision was closed with a 4-0 Monocryl. Dermabond and pressure dressing were placed. The patient was taken to the recovery room in stable condition having tolerated the procedure well.  COMPLICATIONS: none  CONDITION: stable  [name]  12/18/2023, 3:14 PM

## 2023-12-18 NOTE — Progress Notes (Signed)
 Primary Physician:  Carollynn Cirri, NP  History of Present Illness: 12/18/2023 Ricardo Cervantes. is a 68 y.o. male who presents with the chief complaint of aortic aneurysm. Neurosurgery was consulted for lumbar drain placement.   Review of Systems:  A 10 point review of systems is negative, except for the pertinent positives and negatives detailed in the HPI.  Past Medical History: Past Medical History:  Diagnosis Date   Aneurysm of descending thoracic aorta without rupture (HCC)    Aortic atherosclerosis (HCC)    Atrial fibrillation (HCC)    a.) CHA2DS2VASc = 7 (age, HFrEF, HTN, CVA x 2, vascular disease, T2DM) as of 11/28/2023; b.) rate/rhythm maintained on oral digoxin + metoprolol succinate; chronically anticoagulated with apixaban   Atypical mycobacterial infection of lung (HCC)    Bipolar disorder (HCC)    BPH (benign prostatic hyperplasia)    CAD (coronary artery disease)    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    Complication of anesthesia    a.) difficulty maintaining adequate sedation/anesthesia; premature emergence; b.) fentanyl makes patient "mean, grumpy, and difficult"   COPD (chronic obstructive pulmonary disease) (HCC)    CVA (cerebral vascular accident) (HCC)    DDD (degenerative disc disease), cervical    a.) s/p ACDF C6-C7   Depression    Diverticulosis    Dyspnea    Erectile dysfunction    a.) on PDE5i (sildenafil) PRN   GERD (gastroesophageal reflux disease)    Headache    Hepatitis C virus infection cured after antiviral drug therapy    a.) s/p completed Tx course of ledipasvir-sofosbuvir   HFrEF (heart failure with reduced ejection fraction) (HCC)    History of bilateral cataract extraction 03/2022   History of kidney stones 2016   History of TB (tuberculosis)    HTN (hypertension)    Hyperlipidemia    Infrarenal abdominal aortic aneurysm (AAA) without rupture (HCC)    Myocardial bridge    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    On apixaban therapy    Pericarditis    Peripheral vascular disease (HCC)    Pneumonia    Protein calorie malnutrition (HCC)    RA (rheumatoid arthritis) (HCC)    Smoker    T2DM (type 2 diabetes mellitus) (HCC)    Thoracic scoliosis     Past Surgical History: Past Surgical History:  Procedure Laterality Date   ABDOMINAL AORTOGRAM W/LOWER EXTREMITY N/A 01/15/2017   Procedure: Abdominal Aortogram w/Lower Extremity;  Surgeon: Jackquelyn Mass, MD;  Location: [facility];  Service: Cardiovascular;  Laterality: N/A;   ANTERIOR CERVICAL DECOMP/DISCECTOMY FUSION N/A 2004   FEMORAL-POPLITEAL BYPASS GRAFT Right 06/19/2017   Procedure: BYPASS GRAFT FEMORAL-POPLITEAL ARTERY;  Surgeon: Jackquelyn Mass, MD;  Location: [facility];  Service: Vascular;  Laterality: Right;   FEMORAL-POPLITEAL BYPASS GRAFT     had 2 on right leg , 1 on the left   fractured nose repair     as a child   liver abcess removed      LOWER EXTREMITY ANGIOGRAPHY Right 01/15/2017   Procedure: Lower Extremity Angiography;  Surgeon: Jackquelyn Mass, MD;  Location: [facility];  Service: Cardiovascular;  Laterality: Right;   LOWER EXTREMITY ANGIOGRAPHY Right 02/13/2017   Procedure: Lower Extremity Angiography;  Surgeon: Jackquelyn Mass, MD;  Location: [facility];  Service: Cardiovascular;  Laterality: Right;   VASECTOMY N/A     Allergies: Allergies as of 11/20/2023 - Review  Complete 10/24/2023  Allergen Reaction Noted   Chantix [varenicline] Other (See Comments) 10/07/2017    Medications:  Current Facility-Administered Medications:    0.9 %  sodium chloride infusion, , Intravenous, Continuous, Schnier, Latina Craver, MD, Last Rate: 100 mL/hr at 12/18/23 0745, New Bag at 12/18/23 0745   acetaminophen (OFIRMEV) IV 1,000 mg, 1,000 mg, Intravenous, Once PRN, Reed Breech, MD   ceFAZolin (ANCEF) IVPB 2g/100 mL premix, 2 g, Intravenous, On Call to OR, Georgiana Spinner, NP   fentaNYL  (SUBLIMAZE) injection 25-50 mcg, 25-50 mcg, Intravenous, Q5 min PRN, Reed Breech, MD   HYDROmorphone (DILAUDID) injection 1 mg, 1 mg, Intravenous, Once PRN, Georgiana Spinner, NP   lactated ringers infusion, , Intravenous, Continuous, Reed Breech, MD   ondansetron [facility]) injection 4 mg, 4 mg, Intravenous, Q6H PRN, Georgiana Spinner, NP   oxyCODONE (Oxy IR/ROXICODONE) immediate release tablet 5 mg, 5 mg, Oral, Once PRN **OR** oxyCODONE (ROXICODONE) 5 MG/5ML solution 5 mg, 5 mg, Oral, Once PRN, Reed Breech, MD   sodium chloride flush (NS) 0.9 % injection 3-10 mL, 3-10 mL, Intravenous, Q12H, Stephanie Coup, MD   sodium chloride flush (NS) 0.9 % injection 3-10 mL, 3-10 mL, Intravenous, PRN, Stephanie Coup, MD   Social History: Social History   Tobacco Use   Smoking status: Every Day    Current packs/day: 3.00    Average packs/day: 3.0 packs/day for 10.3 years (30.9 ttl pk-yrs)    Types: Cigarettes    Start date: 09/03/2013   Smokeless tobacco: Never   Tobacco comments:    Smoking 4 cigarettes daily 09/20/2023    Smoking 2 -3 daily - 11/26/23  Vaping Use   Vaping status: Former  Substance Use Topics   Alcohol use: Not Currently    Alcohol/week: 2.0 standard drinks of alcohol    Types: 2 Shots of liquor per week    Comment: occasional (every 3-4 weeks)    Drug use: No    Comment: polysubstance approx 2 years ago    Family Medical History: Family History  Problem Relation Age of Onset   Cancer Mother        liver   Heart disease Father    Diabetes Father    Breast cancer Sister    Brain cancer Sister     Physical Examination: Vitals:   12/18/23 0731  BP: 124/85  Pulse: 66  Resp: 15  Temp: 97.7 F (36.5 C)  SpO2: 95%     General: Patient is well developed, well nourished, calm, collected, and in no apparent distress.  NEUROLOGICAL:  General: In no acute distress.   Awake, alert, oriented to person, place, and time.  Pupils equal round and reactive to  light.  Facial tone is symmetric.  Tongue protrusion is midline.   No gross deficit noted.   Imaging: Narrative & Impression  CLINICAL DATA:  68 year old male evaluate for abdominal aortic aneurysm.   EXAM: CT ANGIOGRAPHY CHEST, ABDOMEN AND PELVIS   TECHNIQUE: Non-contrast CT of the chest was initially obtained.   Multidetector CT imaging through the chest, abdomen and pelvis was performed using the standard protocol during bolus administration of intravenous contrast. Multiplanar reconstructed images and MIPs were obtained and reviewed to evaluate the vascular anatomy.   RADIATION DOSE REDUCTION: This exam was performed according to the departmental dose-optimization program which includes automated exposure control, adjustment of the mA and/or kV according to patient size and/or use of iterative reconstruction technique.   CONTRAST:  OMNIPAQUE IOHEXOL 350 MG/ML  SOLN   COMPARISON:  09/18/2023   FINDINGS: CTA CHEST FINDINGS   VASCULAR   Preferential opacification of the thoracic aorta. No evidence of thoracic aortic aneurysm or dissection. Normal heart size. No pericardial effusion.   Sinues of Valsalva: 35 mm 40 x 40 mm   Sinotubular Junction: 36 mm   Ascending Aorta: 38 mm   Aortic Arch: 35 mm   Descending aorta: 30 mm at the level of the carina. Fusiform aneurysmal changes about the mid descending thoracic aorta measuring up to 65 mm in maximum short axis dimension. Diffusely ectatic descending thoracic aorta just proximal to the hiatus measuring up to approximately 46 mm.   Branch vessels: Common origin of the brachiocephalic and left common carotid arteries. No significant atherosclerotic changes.   Coronary arteries: Normal origins and courses. Moderate scattered atherosclerotic calcifications.   Main pulmonary artery: 25 mm ,unchanged. No evidence of central pulmonary embolism.   Pulmonary veins: No anomalous pulmonary venous return. No  evidence of left atrial appendage thrombus.   NON VASCULAR   Mediastinum/Nodes: No enlarged mediastinal, hilar, or axillary lymph nodes. Thyroid gland, trachea, and esophagus demonstrate no significant findings.   Lungs/Pleura: No significant change from comparison PET-CT with similar appearing severe upper lobe predominant centrilobular emphysema including partially cavitary medial left upper lobe mass. No new mass or focal consolidation. No pleural effusion.   Musculoskeletal: No chest wall abnormality. No acute or significant osseous findings.   Review of the MIP images confirms the above findings.   CTA ABDOMEN AND PELVIS FINDINGS   VASCULAR   Aorta: Fusiform infrarenal abdominal aortic aneurysm measuring up to 52 mm in maximum short axis dimension. Scattered atherosclerotic calcifications about the otherwise patent abdominal aorta.   Celiac: Patent without evidence of aneurysm, dissection, vasculitis or significant stenosis.   SMA: Patent without evidence of aneurysm, dissection, vasculitis or significant stenosis.   Renals: Single bilateral renal arteries are patent without evidence of aneurysm, dissection, vasculitis, fibromuscular dysplasia or significant stenosis.   IMA: Patent without evidence of aneurysm, dissection, vasculitis or significant stenosis.   Inflow: Patent without evidence of aneurysm, dissection, vasculitis or significant stenosis. Scattered atherosclerotic calcifications.   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 The left common and profundal femoral arteries are patent, with occlusion of the visualized proximal superficial femoral artery.   Veins: No obvious venous abnormality within the limitations of this arterial phase study.   Review of the MIP images confirms the  above findings.   NON-VASCULAR   Hepatobiliary: No focal liver abnormality is seen. Status post cholecystectomy. No biliary dilatation.   Pancreas: Unremarkable. No pancreatic ductal dilatation or surrounding inflammatory changes.   Spleen: No splenic injury or perisplenic hematoma.   Adrenals/Urinary Tract: Adrenal glands are unremarkable. Kidneys are normal, without renal calculi, focal lesion, or hydronephrosis. Bladder is unremarkable.   Stomach/Bowel: Stomach is within normal limits. Appendix is not definitively identified. Scattered sigmoid diverticula without surrounding inflammatory changes. No evidence of bowel wall thickening, distention, or inflammatory changes.   Lymphatic: No abdominopelvic lymphadenopathy.   Reproductive: Prostate is unremarkable.   Other: No abdominal wall hernia or abnormality. No abdominopelvic ascites.   Musculoskeletal: No acute or significant osseous findings.   IMPRESSION: 1. Fusiform aneurysmal changes about the mid descending thoracic aorta measuring up to 65 mm in maximum short axis dimension. Cardiothoracic surgery consultation recommended due to increased risk of rupture  for arch aneurysm ? 5.5 cm. This recommendation follows 2010 ACCF/AHA/AATS/ACR/ASA/SCA/SCAI/SIR/STS/SVM Guidelines for the Diagnosis and Management of Patients With Thoracic Aortic Disease. Circulation. 2010; 121: Z366-Y403. Aortic aneurysm NOS (ICD10-I71.9) 2. Fusiform infrarenal abdominal aortic aneurysm measuring up to 52 mm in maximum short axis dimension. Recommend follow-up CT or MR as appropriate in 6 months and referral to or continued care with vascular specialist. (Ref.: J Vasc Surg. 2018; 67:2-77 and J Am Coll Radiol 2013;10(10):789-794.) 3. 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 4. Occlusion  of the visualized proximal right superficial femoral artery. 5. No acute intrathoracic or intra-abdominal process. 6. Similar appearing severe upper lobe predominant centrilobular emphysema including partially cavitary medial left upper lobe mass, better characterized on recent PET-CT.   Creasie Doctor, MD   [facility]   [facility]     Electronically Signed   By: Creasie Doctor M.D.   On: 10/30/2023 16:34   Labs:    Latest Ref Rng & Units 11/26/2023   10:40 AM 07/09/2023    1:41 PM 04/22/2023    1:13 PM  CBC  WBC 4.0 - 10.5 K/uL 6.7  7.4  6.8   Hemoglobin 13.0 - 17.0 g/dL 47.4  25.9  56.3   Hematocrit 39.0 - 52.0 % 46.4  47.7  47.3   Platelets 150 - 400 K/uL 225  236  214       Latest Ref Rng & Units 11/26/2023   10:40 AM 07/09/2023    1:41 PM 04/22/2023    1:13 PM  BMP  Glucose 70 - 99 mg/dL 875  94  643   BUN 8 - 23 mg/dL 19  20  16    Creatinine 0.61 - 1.24 mg/dL 3.29  5.18  8.41   BUN/Creat Ratio 6 - 22 (calc)  SEE NOTE:  25   Sodium 135 - 145 mmol/L 137  138  140   Potassium 3.5 - 5.1 mmol/L 4.0  4.3  3.7   Chloride 98 - 111 mmol/L 100  100  102   CO2 22 - 32 mmol/L 26  28  27    Calcium 8.9 - 10.3 mg/dL 9.8  9.9  9.7         Assessment and Plan: Mr. Renwick is a pleasant 68 y.o. male with aortic aneurysm here for treatment.  A lumbar drain was requested for prophylactic spinal cord protection.  We discussed the risks and benefits of the procedure.  He agreed to go forward with the procedure and placement in the interventional suite prior to treatment.   Carroll Clamp, MD/MSCR Dept. of Neurosurgery

## 2023-12-18 NOTE — H&P (View-Only) (Signed)
 MRN : [id number]  Ricardo Cervantes. is a 68 y.o. (May 30, 1956) male who presents with chief complaint of check circulation.  History of Present Illness:   Patient presents to [facility] today for staged repair of his aneurysmal disease.  Today we are addressing his thoracic aneurysm.  The patient denies interval development of abdominal or back pain. No new lower extremity pain or discoloration of the toes.    The patient denies interval anaurosis fugax. There is no recent history of TIA symptoms or focal motor deficits.    The patient denies PAD or claudication symptoms.    The patient denies recent episodes of angina or shortness of breath.   CT angiography of the abdomen and pelvis is reviewed by me and shows an infrarenal AAA that measures 5.4 cm with a descending thoracic aortic aneurysm that measures 6 cm maximally but returns to 3 cm proximal to the celiac artery.  Current Meds  Medication Sig   acetaminophen (TYLENOL) 500 MG tablet Take 1,000 mg by mouth every 6 (six) hours as needed.   albuterol (VENTOLIN HFA) 108 (90 Base) MCG/ACT inhaler INHALE 2 PUFFS INTO THE LUNGS EVERY 6 HOURS AS NEEDED FOR WHEEZING OR SHORTNESS OF BREATH.   atorvastatin (LIPITOR) 80 MG tablet TAKE 1 TABLET EVERY DAY   Dexlansoprazole 30 MG capsule DR Take 1 capsule (30 mg total) by mouth in the morning and at bedtime.   digoxin (LANOXIN) 0.25 MG tablet TAKE 1 TABLET EVERY DAY   DULoxetine (CYMBALTA) 60 MG capsule TAKE 1 CAPSULE EVERY DAY   empagliflozin (JARDIANCE) 10 MG TABS tablet TAKE 1 TABLET EVERY DAY   finasteride (PROSCAR) 5 MG tablet Take 1 tablet (5 mg total) by mouth daily.   Fluticasone-Umeclidin-Vilant (TRELEGY ELLIPTA) 100-62.5-25 MCG/ACT AEPB INHALE 1 PUFF INTO THE LUNGS DAILY   gabapentin (NEURONTIN) 300 MG capsule TAKE 1 CAPSULE (300MG ) AT BEDTIME. IN ADDITION TO 600MG  FOR A TOTAL OF 900MG  AT  BEDTIME   hydrocortisone 1 % ointment Apply 1 Application topically as needed for itching.   metoprolol succinate (TOPROL-XL) 25 MG 24 hr tablet TAKE 1 TABLET EVERY DAY   sildenafil (VIAGRA) 50 MG tablet TAKE 1 TABLET EVERY DAY AS NEEDED FOR ERECTILE DYSFUNCTION   tamsulosin (FLOMAX) 0.4 MG CAPS capsule Take 1 capsule (0.4 mg total) by mouth daily.   varenicline (CHANTIX) 1 MG tablet TAKE 1/2 TABLET DAILY FOR 3 DAYS, 1/2 TABLET TWICE DAILY FOR 4 DAYS, THEN TAKE 1 TABLET TWICE DAILY (Patient taking differently: 2 (two) times daily.)    Past Medical History:  Diagnosis Date   Aneurysm of descending thoracic aorta without rupture (HCC)    Aortic atherosclerosis (HCC)    Atrial fibrillation (HCC)    a.) CHA2DS2VASc = 7 (age, HFrEF, HTN, CVA x 2, vascular disease, T2DM) as of 11/28/2023; b.) rate/rhythm maintained on oral digoxin + metoprolol succinate; chronically anticoagulated with apixaban   Atypical mycobacterial infection of lung (HCC)    Bipolar disorder (HCC)    BPH (benign prostatic hyperplasia)    CAD (coronary artery disease)    Cavitating mass  in left upper lung lobe    Complication of anesthesia    a.) difficulty maintaining adequate sedation/anesthesia; premature emergence; b.) fentanyl makes patient "mean, grumpy, and difficult"   COPD (chronic obstructive pulmonary disease) (HCC)    CVA (cerebral vascular accident) (HCC)    DDD (degenerative disc disease), cervical    a.) s/p ACDF C6-C7   Depression    Diverticulosis    Dyspnea    Erectile dysfunction    a.) on PDE5i (sildenafil) PRN   GERD (gastroesophageal reflux disease)    Headache    Hepatitis C virus infection cured after antiviral drug therapy    a.) s/p completed Tx course of ledipasvir-sofosbuvir   HFrEF (heart failure with reduced ejection fraction) (HCC)    History of bilateral cataract extraction 03/2022   History of kidney stones 2016   History of TB (tuberculosis)    HTN (hypertension)    Hyperlipidemia     Infrarenal abdominal aortic aneurysm (AAA) without rupture (HCC)    Myocardial bridge    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   On apixaban therapy    Pericarditis    Peripheral vascular disease (HCC)    Pneumonia    Protein calorie malnutrition (HCC)    RA (rheumatoid arthritis) (HCC)    Smoker    T2DM (type 2 diabetes mellitus) (HCC)    Thoracic scoliosis     Past Surgical History:  Procedure Laterality Date   ABDOMINAL AORTOGRAM W/LOWER EXTREMITY N/A 01/15/2017   Procedure: Abdominal Aortogram w/Lower Extremity;  Surgeon: Jackquelyn Mass, MD;  Location: [facility];  Service: Cardiovascular;  Laterality: N/A;   ANTERIOR CERVICAL DECOMP/DISCECTOMY FUSION N/A 2004   FEMORAL-POPLITEAL BYPASS GRAFT Right 06/19/2017   Procedure: BYPASS GRAFT FEMORAL-POPLITEAL ARTERY;  Surgeon: Jackquelyn Mass, MD;  Location: [facility] OR[facility];  Service: Vascular;  Laterality: Right;   FEMORAL-POPLITEAL BYPASS GRAFT     had 2 on right leg , 1 on the left   fractured nose repair     as a child   liver abcess removed      LOWER EXTREMITY ANGIOGRAPHY Right 01/15/2017   Procedure: Lower Extremity Angiography;  Surgeon: Jackquelyn Mass, MD;  Location: [facility];  Service: Cardiovascular;  Laterality: Right;   LOWER EXTREMITY ANGIOGRAPHY Right 02/13/2017   Procedure: Lower Extremity Angiography;  Surgeon: Jackquelyn Mass, MD;  Location: [facility];  Service: Cardiovascular;  Laterality: Right;   VASECTOMY N/A     Social History Social History   Tobacco Use   Smoking status: Every Day    Current packs/day: 3.00    Average packs/day: 3.0 packs/day for 10.3 years (30.9 ttl pk-yrs)    Types: Cigarettes    Start date: 09/03/2013   Smokeless tobacco: Never   Tobacco comments:    Smoking 4 cigarettes daily 09/20/2023    Smoking 2 -3 daily - 11/26/23  Vaping Use   Vaping status: Former  Substance Use Topics   Alcohol use: Not Currently     Alcohol/week: 2.0 standard drinks of alcohol    Types: 2 Shots of liquor per week    Comment: occasional (every 3-4 weeks)    Drug use: No    Comment: polysubstance approx 2 years ago    Family History Family History  Problem Relation Age of Onset   Cancer Mother        liver   Heart disease Father    Diabetes Father    Breast  cancer Sister    Brain cancer Sister     Allergies  Allergen Reactions   Chantix [Varenicline] Other (See Comments)    Depression     REVIEW OF SYSTEMS (Negative unless checked)  Constitutional: [] Weight loss  [] Fever  [] Chills Cardiac: [] Chest pain   [] Chest pressure   [] Palpitations   [] Shortness of breath when laying flat   [] Shortness of breath with exertion. Vascular:  [x] Pain in legs with walking   [] Pain in legs at rest  [] History of DVT   [] Phlebitis   [] Swelling in legs   [] Varicose veins   [] Non-healing ulcers Pulmonary:   [] Uses home oxygen   [] Productive cough   [] Hemoptysis   [] Wheeze  [] COPD   [] Asthma Neurologic:  [] Dizziness   [] Seizures   [] History of stroke   [] History of TIA  [] Aphasia   [] Vissual changes   [] Weakness or numbness in arm   [] Weakness or numbness in leg Musculoskeletal:   [] Joint swelling   [] Joint pain   [] Low back pain Hematologic:  [] Easy bruising  [] Easy bleeding   [] Hypercoagulable state   [] Anemic Gastrointestinal:  [] Diarrhea   [] Vomiting  [] Gastroesophageal reflux/heartburn   [] Difficulty swallowing. Genitourinary:  [] Chronic kidney disease   [] Difficult urination  [] Frequent urination   [] Blood in urine Skin:  [] Rashes   [] Ulcers  Psychological:  [] History of anxiety   []  History of major depression.  Physical Examination  Vitals:   12/18/23 0731  BP: 124/85  Pulse: 66  Resp: 15  Temp: 97.7 F (36.5 C)  TempSrc: Oral  SpO2: 95%  Weight: 75.8 kg  Height: 6\' 2"  (1.88 m)   Body mass index is 21.47 kg/m. Gen: WD/WN, NAD Head: Hooks/AT, No temporalis wasting.  Ear/Nose/Throat: Hearing grossly intact,  nares w/o erythema or drainage Eyes: PER, EOMI, sclera nonicteric.  Neck: Supple, no masses.  No bruit or JVD.  Pulmonary:  Good air movement, no audible wheezing, no use of accessory muscles.  Cardiac: RRR, normal S1, S2, no Murmurs. Vascular:  mild trophic changes, no open wounds Vessel Right Left  Radial Palpable Palpable  PT Not Palpable Not Palpable  DP Not Palpable Not Palpable  Gastrointestinal: soft, non-distended. No guarding/no peritoneal signs.  Musculoskeletal: M/S 5/5 throughout.  No visible deformity.  Neurologic: CN 2-12 intact. Pain and light touch intact in extremities.  Symmetrical.  Speech is fluent. Motor exam as listed above. Psychiatric: Judgment intact, Mood & affect appropriate for pt's clinical situation. Dermatologic: No rashes or ulcers noted.  No changes consistent with cellulitis.   CBC Lab Results  Component Value Date   WBC 6.7 11/26/2023   HGB 15.3 11/26/2023   HCT 46.4 11/26/2023   MCV 92.1 11/26/2023   PLT 225 11/26/2023    BMET    Component Value Date/Time   NA 137 11/26/2023 1040   NA 137 11/14/2020 1409   K 4.0 11/26/2023 1040   CL 100 11/26/2023 1040   CO2 26 11/26/2023 1040   GLUCOSE 109 (H) 11/26/2023 1040   BUN 19 11/26/2023 1040   BUN 15 11/14/2020 1409   CREATININE 0.83 11/26/2023 1040   CREATININE 0.82 07/09/2023 1341   CALCIUM 9.8 11/26/2023 1040   GFRNONAA >60 11/26/2023 1040   GFRNONAA 94 12/18/2017 1137   GFRAA 109 12/18/2017 1137   CrCl cannot be calculated (Patient's most recent lab result is older than the maximum 21 days allowed.).  COAG Lab Results  Component Value Date   INR 1.10 06/13/2017    Radiology No results found.  Assessment/Plan 1. Aneurysm of descending thoracic aorta without rupture (HCC) (Primary) Recommend:  The descending  TAA is >6 cm and therefore should undergo repair. Patient is status post CT scan of the thoracic and abdominal aorta. Base on evaluation of the CT scan by me the patient  is a candidate for endovascular repair.    The patient has been given cardiac clearance prior to stent graft placement.    We have arranged for neurosurgery to place a spinal drain prior to the stent and will now move forward with repair.  The patient will continue antiplatelet therapy as prescribed (since the patient is undergoing endovascular repair as opposed to open repair) as well as aggressive management of hyperlipidemia. Exercise is encouraged.    The patient is reminded that lifetime routine surveillance is a necessity with an endograft.    The risks and benefits of AAA repair are reviewed with the patient.  All questions are answered.  Alternative therapies are also discussed.  The patient agrees to proceed with endovascular aneurysm repair to prevent lethal rupture.   Patient will follow-up with me in the office after the surgery. - Ambulatory referral to Cardiology   2. Infrarenal abdominal aortic aneurysm (AAA) without rupture (HCC) Recommend:  The AAA is > 5 cm and therefore should undergo repair. Patient is status post CT scan of the abdominal aorta. Base on evaluation of the CT scan by me the patient is a candidate for endovascular repair.    The patient will require cardiac clearance prior to stent graft placement.    The patient will continue antiplatelet therapy as prescribed (since the patient is undergoing endovascular repair as opposed to open repair) as well as aggressive management of hyperlipidemia. Exercise is encouraged.    The patient is reminded that lifetime routine surveillance is a necessity with an endograft.    The risks and benefits of AAA repair are reviewed with the patient.  All questions are answered.  Alternative therapies are also discussed.  The patient agrees to proceed with endovascular aneurysm repair to prevent lethal rupture.  This portion will be fixed second and staged approximately 1 month after the thoracic component has been fixed    Patient will follow-up with me in the office after the surgery. - Ambulatory referral to Cardiology   3. Atherosclerosis of native artery of both lower extremities with intermittent claudication (HCC)  Recommend:   The patient has evidence of atherosclerosis of the lower extremities with claudication.  The patient does not voice lifestyle limiting changes at this point in time.   Noninvasive studies do not suggest clinically significant change.   No invasive studies, angiography or surgery at this time The patient should continue walking and begin a more formal exercise program.  The patient should continue antiplatelet therapy and aggressive treatment of the lipid abnormalities   No changes in the patient's medications at this time   Continued surveillance is indicated as atherosclerosis is likely to progress with time.     The patient will continue follow up with noninvasive studies as ordered.    4. Chronic atrial fibrillation (HCC) Continue antiarrhythmia medications as already ordered, these medications have been reviewed and there are no changes at this time.   Continue anticoagulation as ordered by Cardiology Service   5. Type 2 diabetes mellitus with diabetic peripheral angiopathy without gangrene, without long-term current use of insulin (HCC) Continue hypoglycemic medications as already ordered, these medications have been reviewed and there are no changes at this time.  Hgb A1C to be monitored as already arranged by primary service   [name], MD  12/18/2023 12:02 PM

## 2023-12-18 NOTE — Progress Notes (Signed)
 MRN : [id number]  Ricardo Cervantes. is a 68 y.o. (Feb 23, 1956) male who presents with chief complaint of check circulation.  History of Present Illness:   Patient presents to [facility] today for staged repair of his aneurysmal disease.  Today we are addressing his thoracic aneurysm.  The patient denies interval development of abdominal or back pain. No new lower extremity pain or discoloration of the toes.    The patient denies interval anaurosis fugax. There is no recent history of TIA symptoms or focal motor deficits.    The patient denies PAD or claudication symptoms.    The patient denies recent episodes of angina or shortness of breath.   CT angiography of the abdomen and pelvis is reviewed by me and shows an infrarenal AAA that measures 5.4 cm with a descending thoracic aortic aneurysm that measures 6 cm maximally but returns to 3 cm proximal to the celiac artery.  Current Meds  Medication Sig   acetaminophen (TYLENOL) 500 MG tablet Take 1,000 mg by mouth every 6 (six) hours as needed.   albuterol (VENTOLIN HFA) 108 (90 Base) MCG/ACT inhaler INHALE 2 PUFFS INTO THE LUNGS EVERY 6 HOURS AS NEEDED FOR WHEEZING OR SHORTNESS OF BREATH.   atorvastatin (LIPITOR) 80 MG tablet TAKE 1 TABLET EVERY DAY   Dexlansoprazole 30 MG capsule DR Take 1 capsule (30 mg total) by mouth in the morning and at bedtime.   digoxin (LANOXIN) 0.25 MG tablet TAKE 1 TABLET EVERY DAY   DULoxetine (CYMBALTA) 60 MG capsule TAKE 1 CAPSULE EVERY DAY   empagliflozin (JARDIANCE) 10 MG TABS tablet TAKE 1 TABLET EVERY DAY   finasteride (PROSCAR) 5 MG tablet Take 1 tablet (5 mg total) by mouth daily.   Fluticasone-Umeclidin-Vilant (TRELEGY ELLIPTA) 100-62.5-25 MCG/ACT AEPB INHALE 1 PUFF INTO THE LUNGS DAILY   gabapentin (NEURONTIN) 300 MG capsule TAKE 1 CAPSULE (300MG ) AT BEDTIME. IN ADDITION TO 600MG  FOR A TOTAL OF 900MG  AT  BEDTIME   hydrocortisone 1 % ointment Apply 1 Application topically as needed for itching.   metoprolol succinate (TOPROL-XL) 25 MG 24 hr tablet TAKE 1 TABLET EVERY DAY   sildenafil (VIAGRA) 50 MG tablet TAKE 1 TABLET EVERY DAY AS NEEDED FOR ERECTILE DYSFUNCTION   tamsulosin (FLOMAX) 0.4 MG CAPS capsule Take 1 capsule (0.4 mg total) by mouth daily.   varenicline (CHANTIX) 1 MG tablet TAKE 1/2 TABLET DAILY FOR 3 DAYS, 1/2 TABLET TWICE DAILY FOR 4 DAYS, THEN TAKE 1 TABLET TWICE DAILY (Patient taking differently: 2 (two) times daily.)    Past Medical History:  Diagnosis Date   Aneurysm of descending thoracic aorta without rupture (HCC)    Aortic atherosclerosis (HCC)    Atrial fibrillation (HCC)    a.) CHA2DS2VASc = 7 (age, HFrEF, HTN, CVA x 2, vascular disease, T2DM) as of 11/28/2023; b.) rate/rhythm maintained on oral digoxin + metoprolol succinate; chronically anticoagulated with apixaban   Atypical mycobacterial infection of lung (HCC)    Bipolar disorder (HCC)    BPH (benign prostatic hyperplasia)    CAD (coronary artery disease)    Cavitating mass  in left upper lung lobe    Complication of anesthesia    a.) difficulty maintaining adequate sedation/anesthesia; premature emergence; b.) fentanyl makes patient "mean, grumpy, and difficult"   COPD (chronic obstructive pulmonary disease) (HCC)    CVA (cerebral vascular accident) (HCC)    DDD (degenerative disc disease), cervical    a.) s/p ACDF C6-C7   Depression    Diverticulosis    Dyspnea    Erectile dysfunction    a.) on PDE5i (sildenafil) PRN   GERD (gastroesophageal reflux disease)    Headache    Hepatitis C virus infection cured after antiviral drug therapy    a.) s/p completed Tx course of ledipasvir-sofosbuvir   HFrEF (heart failure with reduced ejection fraction) (HCC)    History of bilateral cataract extraction 03/2022   History of kidney stones 2016   History of TB (tuberculosis)    HTN (hypertension)    Hyperlipidemia     Infrarenal abdominal aortic aneurysm (AAA) without rupture (HCC)    Myocardial bridge    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   On apixaban therapy    Pericarditis    Peripheral vascular disease (HCC)    Pneumonia    Protein calorie malnutrition (HCC)    RA (rheumatoid arthritis) (HCC)    Smoker    T2DM (type 2 diabetes mellitus) (HCC)    Thoracic scoliosis     Past Surgical History:  Procedure Laterality Date   ABDOMINAL AORTOGRAM W/LOWER EXTREMITY N/A 01/15/2017   Procedure: Abdominal Aortogram w/Lower Extremity;  Surgeon: Renford Dills, MD;  Location: [facility]IVE [facility];  Service: Cardiovascular;  Laterality: N/A;   ANTERIOR CERVICAL DECOMP/DISCECTOMY FUSION N/A 2004   FEMORAL-POPLITEAL BYPASS GRAFT Right 06/19/2017   Procedure: BYPASS GRAFT FEMORAL-POPLITEAL ARTERY;  Surgeon: Renford Dills, MD;  Location: [facility] ORS;  Service: Vascular;  Laterality: Right;   FEMORAL-POPLITEAL BYPASS GRAFT     had 2 on right leg , 1 on the left   fractured nose repair     as a child   liver abcess removed      LOWER EXTREMITY ANGIOGRAPHY Right 01/15/2017   Procedure: Lower Extremity Angiography;  Surgeon: Renford Dills, MD;  Location: [facility];  Service: Cardiovascular;  Laterality: Right;   LOWER EXTREMITY ANGIOGRAPHY Right 02/13/2017   Procedure: Lower Extremity Angiography;  Surgeon: Renford Dills, MD;  Location: [facility];  Service: Cardiovascular;  Laterality: Right;   VASECTOMY N/A     Social History Social History   Tobacco Use   Smoking status: Every Day    Current packs/day: 3.00    Average packs/day: 3.0 packs/day for 10.3 years (30.9 ttl pk-yrs)    Types: Cigarettes    Start date: 09/03/2013   Smokeless tobacco: Never   Tobacco comments:    Smoking 4 cigarettes daily 09/20/2023    Smoking 2 -3 daily - 11/26/23  Vaping Use   Vaping status: Former  Substance Use Topics   Alcohol use: Not Currently     Alcohol/week: 2.0 standard drinks of alcohol    Types: 2 Shots of liquor per week    Comment: occasional (every 3-4 weeks)    Drug use: No    Comment: polysubstance approx 2 years ago    Family History Family History  Problem Relation Age of Onset   Cancer Mother        liver   Heart disease Father    Diabetes Father    Breast  cancer Sister    Brain cancer Sister     Allergies  Allergen Reactions   Chantix [Varenicline] Other (See Comments)    Depression     REVIEW OF SYSTEMS (Negative unless checked)  Constitutional: [] Weight loss  [] Fever  [] Chills Cardiac: [] Chest pain   [] Chest pressure   [] Palpitations   [] Shortness of breath when laying flat   [] Shortness of breath with exertion. Vascular:  [x] Pain in legs with walking   [] Pain in legs at rest  [] History of DVT   [] Phlebitis   [] Swelling in legs   [] Varicose veins   [] Non-healing ulcers Pulmonary:   [] Uses home oxygen   [] Productive cough   [] Hemoptysis   [] Wheeze  [] COPD   [] Asthma Neurologic:  [] Dizziness   [] Seizures   [] History of stroke   [] History of TIA  [] Aphasia   [] Vissual changes   [] Weakness or numbness in arm   [] Weakness or numbness in leg Musculoskeletal:   [] Joint swelling   [] Joint pain   [] Low back pain Hematologic:  [] Easy bruising  [] Easy bleeding   [] Hypercoagulable state   [] Anemic Gastrointestinal:  [] Diarrhea   [] Vomiting  [] Gastroesophageal reflux/heartburn   [] Difficulty swallowing. Genitourinary:  [] Chronic kidney disease   [] Difficult urination  [] Frequent urination   [] Blood in urine Skin:  [] Rashes   [] Ulcers  Psychological:  [] History of anxiety   []  History of major depression.  Physical Examination  Vitals:   12/18/23 0731  BP: 124/85  Pulse: 66  Resp: 15  Temp: 97.7 F (36.5 C)  TempSrc: Oral  SpO2: 95%  Weight: 75.8 kg  Height: 6\' 2"  (1.88 m)   Body mass index is 21.47 kg/m. Gen: WD/WN, NAD Head: Hooks/AT, No temporalis wasting.  Ear/Nose/Throat: Hearing grossly intact,  nares w/o erythema or drainage Eyes: PER, EOMI, sclera nonicteric.  Neck: Supple, no masses.  No bruit or JVD.  Pulmonary:  Good air movement, no audible wheezing, no use of accessory muscles.  Cardiac: RRR, normal S1, S2, no Murmurs. Vascular:  mild trophic changes, no open wounds Vessel Right Left  Radial Palpable Palpable  PT Not Palpable Not Palpable  DP Not Palpable Not Palpable  Gastrointestinal: soft, non-distended. No guarding/no peritoneal signs.  Musculoskeletal: M/S 5/5 throughout.  No visible deformity.  Neurologic: CN 2-12 intact. Pain and light touch intact in extremities.  Symmetrical.  Speech is fluent. Motor exam as listed above. Psychiatric: Judgment intact, Mood & affect appropriate for pt's clinical situation. Dermatologic: No rashes or ulcers noted.  No changes consistent with cellulitis.   CBC Lab Results  Component Value Date   WBC 6.7 11/26/2023   HGB 15.3 11/26/2023   HCT 46.4 11/26/2023   MCV 92.1 11/26/2023   PLT 225 11/26/2023    BMET    Component Value Date/Time   NA 137 11/26/2023 1040   NA 137 11/14/2020 1409   K 4.0 11/26/2023 1040   CL 100 11/26/2023 1040   CO2 26 11/26/2023 1040   GLUCOSE 109 (H) 11/26/2023 1040   BUN 19 11/26/2023 1040   BUN 15 11/14/2020 1409   CREATININE 0.83 11/26/2023 1040   CREATININE 0.82 07/09/2023 1341   CALCIUM 9.8 11/26/2023 1040   GFRNONAA >60 11/26/2023 1040   GFRNONAA 94 12/18/2017 1137   GFRAA 109 12/18/2017 1137   CrCl cannot be calculated (Patient's most recent lab result is older than the maximum 21 days allowed.).  COAG Lab Results  Component Value Date   INR 1.10 06/13/2017    Radiology No results found.  Assessment/Plan 1. Aneurysm of descending thoracic aorta without rupture (HCC) (Primary) Recommend:  The descending  TAA is >6 cm and therefore should undergo repair. Patient is status post CT scan of the thoracic and abdominal aorta. Base on evaluation of the CT scan by me the patient  is a candidate for endovascular repair.    The patient has been given cardiac clearance prior to stent graft placement.    We have arranged for neurosurgery to place a spinal drain prior to the stent and will now move forward with repair.  The patient will continue antiplatelet therapy as prescribed (since the patient is undergoing endovascular repair as opposed to open repair) as well as aggressive management of hyperlipidemia. Exercise is encouraged.    The patient is reminded that lifetime routine surveillance is a necessity with an endograft.    The risks and benefits of AAA repair are reviewed with the patient.  All questions are answered.  Alternative therapies are also discussed.  The patient agrees to proceed with endovascular aneurysm repair to prevent lethal rupture.   Patient will follow-up with me in the office after the surgery. - Ambulatory referral to Cardiology   2. Infrarenal abdominal aortic aneurysm (AAA) without rupture (HCC) Recommend:  The AAA is > 5 cm and therefore should undergo repair. Patient is status post CT scan of the abdominal aorta. Base on evaluation of the CT scan by me the patient is a candidate for endovascular repair.    The patient will require cardiac clearance prior to stent graft placement.    The patient will continue antiplatelet therapy as prescribed (since the patient is undergoing endovascular repair as opposed to open repair) as well as aggressive management of hyperlipidemia. Exercise is encouraged.    The patient is reminded that lifetime routine surveillance is a necessity with an endograft.    The risks and benefits of AAA repair are reviewed with the patient.  All questions are answered.  Alternative therapies are also discussed.  The patient agrees to proceed with endovascular aneurysm repair to prevent lethal rupture.  This portion will be fixed second and staged approximately 1 month after the thoracic component has been fixed    Patient will follow-up with me in the office after the surgery. - Ambulatory referral to Cardiology   3. Atherosclerosis of native artery of both lower extremities with intermittent claudication (HCC)  Recommend:   The patient has evidence of atherosclerosis of the lower extremities with claudication.  The patient does not voice lifestyle limiting changes at this point in time.   Noninvasive studies do not suggest clinically significant change.   No invasive studies, angiography or surgery at this time The patient should continue walking and begin a more formal exercise program.  The patient should continue antiplatelet therapy and aggressive treatment of the lipid abnormalities   No changes in the patient's medications at this time   Continued surveillance is indicated as atherosclerosis is likely to progress with time.     The patient will continue follow up with noninvasive studies as ordered.    4. Chronic atrial fibrillation (HCC) Continue antiarrhythmia medications as already ordered, these medications have been reviewed and there are no changes at this time.   Continue anticoagulation as ordered by Cardiology Service   5. Type 2 diabetes mellitus with diabetic peripheral angiopathy without gangrene, without long-term current use of insulin (HCC) Continue hypoglycemic medications as already ordered, these medications have been reviewed and there are no changes at this time.  Hgb A1C to be monitored as already arranged by primary service   Levora Dredge, MD  12/18/2023 12:02 PM

## 2023-12-19 ENCOUNTER — Encounter: Payer: Self-pay | Admitting: Vascular Surgery

## 2023-12-19 DIAGNOSIS — E119 Type 2 diabetes mellitus without complications: Secondary | ICD-10-CM | POA: Diagnosis not present

## 2023-12-19 DIAGNOSIS — I7123 Aneurysm of the descending thoracic aorta, without rupture: Principal | ICD-10-CM

## 2023-12-19 LAB — BASIC METABOLIC PANEL WITH GFR
Anion gap: 4 — ABNORMAL LOW (ref 5–15)
BUN: 20 mg/dL (ref 8–23)
CO2: 26 mmol/L (ref 22–32)
Calcium: 8.4 mg/dL — ABNORMAL LOW (ref 8.9–10.3)
Chloride: 106 mmol/L (ref 98–111)
Creatinine, Ser: 0.76 mg/dL (ref 0.61–1.24)
GFR, Estimated: >60 mL/min (ref >60)
Glucose, Bld: 119 mg/dL — ABNORMAL HIGH (ref 70–99)
Potassium: 4.4 mmol/L (ref 3.5–5.1)
Sodium: 136 mmol/L (ref 135–145)

## 2023-12-19 LAB — CBC
HCT: 39.4 % (ref 39.0–52.0)
Hemoglobin: 12.7 g/dL — ABNORMAL LOW (ref 13.0–17.0)
MCH: 30.7 pg (ref 26.0–34.0)
MCHC: 32.2 g/dL (ref 30.0–36.0)
MCV: 95.2 fL (ref 80.0–100.0)
Platelets: 160 10*3/uL (ref 150–400)
RBC: 4.14 MIL/uL — ABNORMAL LOW (ref 4.22–5.81)
RDW: 15.3 % (ref 11.5–15.5)
WBC: 10.3 10*3/uL (ref 4.0–10.5)
nRBC: 0 % (ref 0.0–0.2)

## 2023-12-19 MED ORDER — OXYCODONE-ACETAMINOPHEN 5-325 MG PO TABS
1.0000 | ORAL_TABLET | ORAL | 0 refills | Status: DC | PRN
Start: 2023-12-19 — End: 2024-01-07

## 2023-12-19 MED ORDER — APIXABAN 5 MG PO TABS
5.0000 mg | ORAL_TABLET | Freq: Two times a day (BID) | ORAL | Status: DC
Start: 2023-12-19 — End: 2023-12-19
  Administered 2023-12-19: 5 mg via ORAL
  Filled 2023-12-19: qty 1

## 2023-12-19 MED ORDER — ASPIRIN 81 MG PO TBEC: 81.0000 mg | DELAYED_RELEASE_TABLET | Freq: Every day | ORAL | 12 refills | Status: DC

## 2023-12-19 MED ORDER — EMPAGLIFLOZIN 10 MG PO TABS
10.0000 mg | ORAL_TABLET | Freq: Every day | ORAL | Status: DC
  Administered 2023-12-19: 10 mg via ORAL
  Filled 2023-12-19: qty 1

## 2023-12-19 MED ORDER — VARENICLINE TARTRATE 0.5 MG PO TABS
1.0000 mg | ORAL_TABLET | Freq: Two times a day (BID) | ORAL | Status: DC
  Administered 2023-12-19: 1 mg via ORAL
  Filled 2023-12-19 (×2): qty 2

## 2023-12-19 NOTE — Progress Notes (Signed)
   Neurosurgery Progress Note  History: Ricardo Cervantes. is s/p left sided lumbar drain placement   POD1: doing well neurologically. Having expected pain in his groin  Physical Exam: Vitals:   12/19/23 0500 12/19/23 0600  BP: 129/69 130/63  Pulse: 67 63  Resp: 12 17  Temp:    SpO2: 99% 100%    AA Ox3 CNI  Strength:5/5 throughout BLE  Drain in place and clamped   Data:  Other tests/results: see results review  Assessment/Plan:  Ricardo Cervantes. Is a 68 y.o s/p aortic aneurysm repair and lumbar drain placement   - Will remove LD this morning  Anastacio Karvonen PA-C Department of Neurosurgery

## 2023-12-19 NOTE — Discharge Summary (Signed)
 [facility]    Discharge Summary    Patient ID:  Ricardo Cervantes. MRN: [id number] DOB/AGE: 1956-02-11 68 y.o.  Admit date: 12/18/2023 Discharge date: 12/19/2023 Date of Surgery: 12/18/2023 Surgeon: Surgeon(s): Schnier, Latina Craver, MD Annice Needy, MD Lovenia Kim, MD  Admission Diagnosis: Thoracic aortic aneurysm without rupture, unspecified part [facility]) [I71.20] Thoracic aortic aneurysm (TAA) (HCC) [I71.20]  Discharge Diagnoses:  Thoracic aortic aneurysm without rupture, unspecified part (HCC) [I71.20] Thoracic aortic aneurysm (TAA) (HCC) [I71.20]  Secondary Diagnoses: Past Medical History:  Diagnosis Date   Aneurysm of descending thoracic aorta without rupture (HCC)    Aortic atherosclerosis (HCC)    Atrial fibrillation (HCC)    a.) CHA2DS2VASc = 7 (age, HFrEF, HTN, CVA x 2, vascular disease, T2DM) as of 11/28/2023; b.) rate/rhythm maintained on oral digoxin + metoprolol succinate; chronically anticoagulated with apixaban   Atypical mycobacterial infection of lung (HCC)    Bipolar disorder (HCC)    BPH (benign prostatic hyperplasia)    CAD (coronary artery disease)    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    Complication of anesthesia    a.) difficulty maintaining adequate sedation/anesthesia; premature emergence; b.) fentanyl makes patient "mean, grumpy, and difficult"   COPD (chronic obstructive pulmonary disease) (HCC)    CVA (cerebral vascular accident) (HCC)    DDD (degenerative disc disease), cervical    a.) s/p ACDF C6-C7   Depression    Diverticulosis    Dyspnea    Erectile dysfunction    a.) on PDE5i (sildenafil) PRN   GERD (gastroesophageal reflux disease)    Headache    Hepatitis C virus infection cured after antiviral drug therapy    a.) s/p completed Tx course of ledipasvir-sofosbuvir   HFrEF (heart failure with reduced ejection fraction) (HCC)    History of bilateral cataract extraction 03/2022   History of kidney  stones 2016   History of TB (tuberculosis)    HTN (hypertension)    Hyperlipidemia    Infrarenal abdominal aortic aneurysm (AAA) without rupture (HCC)    Myocardial bridge    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  Patient underwent CT angiography of the abdomen pelvis which was reviewed by Dr. Levora Dredge MD which showed infrarenal AAA that measures 5.4 cm with a descending thoracic aortic aneurysm that measures 6 cm maximally but returns to 3 cm proximally to the celiac artery.  The patient yesterday on 12/18/2023 underwent endovascular thoracic AAA repair with stent placement.  Patient is recovering as expected.  Hospital Course:  Ricardo Cervantes. is a 68 y.o. male is S/P placement of a Gore T AG conformable thoracic stent graft in the descending thoracic aorta using a 34 X34 X15 stent with a 40 X40 X20 stent and an additional 40 X40 X15 stent components.  Bilateral groin access sites were used.  Patient is resting comfortably today.  Denies any pain.  Patient was able to  ambulate without difficulties.  Patient urinating and eating well.  Patient had a spinal drain in place postoperatively that was removed at noontime today by neurosurgery.  Patient's site is clean dry and intact with dressing in place.  Complications to note no neurodeficits to note.  Patient to be discharged home later today.  Patient to be discharged home on Eliquis 5 mg twice daily and aspirin 81 mg daily and Lipitor 80 mg daily.   Patient was counseled not to skip or miss any of these medications as it will affect the outcome of his surgery.  Patient verbalizes understanding.  Patient to be discharged home.   Extubated: POD # 0 Physical Exam:  Alert notes x3, no acute distress Face: Symmetrical.  Tongue is midline. Neck: Trachea is midline.  No swelling or bruising. Cardiovascular: Regular rate and rhythm Pulmonary: Clear to auscultation bilaterally Abdomen: Soft, nontender, nondistended Right groin access: Clean dry and intact.  No swelling or drainage noted Left groin access: Clean dry and intact.  No swelling or drainage noted Left lower extremity: Thigh soft.  Calf soft.  Extremities warm distally toes.  Hard to palpate pedal pulses however the foot is warm is her good capillary refill. Right lower extremity: Thigh soft.  Calf soft.  Extremities warm distally toes.  Hard to palpate pedal pulses however the foot is warm is her good capillary refill. Neurological: No deficits noted   Post-op wounds:  clean, dry, intact or healing well  Pt. Ambulating, voiding and taking PO diet without difficulty. Pt pain controlled with PO pain meds.  Labs:  As below  Complications: none  Consults:    Significant Diagnostic Studies: CBC Lab Results  Component Value Date   WBC 10.3 12/19/2023   HGB 12.7 (L) 12/19/2023   HCT 39.4 12/19/2023   MCV 95.2 12/19/2023   PLT 160 12/19/2023    BMET    Component Value Date/Time   NA 136 12/19/2023 0407   NA 137 11/14/2020 1409   K 4.4 12/19/2023 0407   CL 106 12/19/2023 0407   CO2 26 12/19/2023 0407   GLUCOSE 119 (H) 12/19/2023 0407   BUN 20 12/19/2023 0407   BUN 15 11/14/2020 1409   CREATININE 0.76 12/19/2023 0407   CREATININE 0.82 07/09/2023 1341   CALCIUM 8.4 (L) 12/19/2023 0407   GFRNONAA >60 12/19/2023 0407   GFRNONAA 94 12/18/2017 1137   GFRAA 109 12/18/2017 1137   COAG Lab Results  Component Value Date   INR 1.2 12/18/2023   INR 1.10 06/13/2017      Disposition:  Discharge to :Home  Allergies as of 12/19/2023       Reactions   Chantix [varenicline] Other (See Comments)   Depression        Medication List     TAKE these medications    acetaminophen 500 MG tablet Commonly known as: TYLENOL Take 1,000 mg by mouth every 6 (six) hours as needed.   albuterol 108 (90 Base) MCG/ACT inhaler Commonly known as: VENTOLIN HFA INHALE 2 PUFFS INTO THE LUNGS EVERY 6 HOURS AS NEEDED FOR WHEEZING OR SHORTNESS OF BREATH.   aspirin EC 81 MG tablet Take 1 tablet (81 mg total) by mouth daily at 6 (six) AM. Swallow whole. Start taking on: December 20, 2023   atorvastatin 80 MG tablet Commonly known as: LIPITOR TAKE 1 TABLET EVERY DAY   Dexlansoprazole 30 MG capsule DR Take 1 capsule (30 mg total) by mouth in the morning and at bedtime.   digoxin  0.25 MG tablet Commonly known as: LANOXIN TAKE 1 TABLET EVERY DAY   DULoxetine 60 MG capsule Commonly known as: CYMBALTA TAKE 1 CAPSULE EVERY DAY   Eliquis 5 MG Tabs tablet Generic drug: apixaban TAKE 1 TABLET TWICE DAILY   finasteride 5 MG tablet Commonly known as: PROSCAR Take 1 tablet (5 mg total) by mouth daily.   gabapentin 600 MG tablet Commonly known as: NEURONTIN TAKE 1 TABLET THREE TIMES DAILY   gabapentin 300 MG capsule Commonly known as: NEURONTIN TAKE 1 CAPSULE (300MG ) AT BEDTIME. IN ADDITION TO 600MG  FOR A TOTAL OF 900MG  AT BEDTIME   hydrocortisone 1 % ointment Apply 1 Application topically as needed for itching.   Jardiance 10 MG Tabs tablet Generic drug: empagliflozin TAKE 1 TABLET EVERY DAY   metoprolol succinate 25 MG 24 hr tablet Commonly known as: TOPROL-XL TAKE 1 TABLET EVERY DAY   oxyCODONE-acetaminophen 5-325 MG tablet Commonly known as: PERCOCET/ROXICET Take 1-2 tablets by mouth every 4 (four) hours as needed for moderate pain (pain score 4-6).   sildenafil 50 MG tablet Commonly known as: VIAGRA TAKE 1 TABLET EVERY DAY AS NEEDED FOR  ERECTILE DYSFUNCTION   spironolactone 25 MG tablet Commonly known as: ALDACTONE Take 25 mg by mouth as needed.   tamsulosin 0.4 MG Caps capsule Commonly known as: FLOMAX Take 1 capsule (0.4 mg total) by mouth daily.   Trelegy Ellipta 100-62.5-25 MCG/ACT Aepb Generic drug: Fluticasone-Umeclidin-Vilant INHALE 1 PUFF INTO THE LUNGS DAILY   Trelegy Ellipta 200-62.5-25 MCG/ACT Aepb Generic drug: Fluticasone-Umeclidin-Vilant Inhale 1 Inhalation into the lungs daily in the afternoon.   varenicline 1 MG tablet Commonly known as: CHANTIX TAKE 1/2 TABLET DAILY FOR 3 DAYS, 1/2 TABLET TWICE DAILY FOR 4 DAYS, THEN TAKE 1 TABLET TWICE DAILY What changed: See the new instructions.       Verbal and written Discharge instructions given to the patient. Wound care per Discharge AVS  Follow-up Information     Schnier, Ninette Basque, MD Follow up in 3 week(s).   Specialties: Vascular Surgery, Cardiology, Radiology, Vascular Surgery Why: Thoracic ENDART Contact information: [address] [place] [address] [phone]                 Signed: Annamaria Barrette, NP  12/19/2023, 2:33 PM

## 2023-12-19 NOTE — Discharge Instructions (Signed)
 Do not lift anything heavy for the next 2 weeks.  Not lift anything more than a gallon of milk approximately 8 to 10 pounds.  Do not drive for the next week  You may shower tomorrow on Friday, 12/20/2023.  Please shower with the groin dressings in place and remove immediately after showering.  Pat both your groins dry and cover puncture site with Band-Aids for the next 3 days.  You are being discharged on Eliquis 5 mg twice daily, aspirin 81 mg daily and Lipitor 80 mg daily.  Do not miss or skip taking any of these medications as they will interfere with the outcome of your procedure.  Follow-up with vein and vascular surgery as scheduled.

## 2023-12-19 NOTE — Progress Notes (Addendum)
 0800 Patient awake and alert. Groin sites clean and dry. 1225 Lumbar drain discontinued by Dr. Annemarie Kil. Tegaderm dressing placed over lumbar site.1400 Patient walking in halls with issues. 1520 Discharge teaching done. Questions answered.1625 Discharged home with son.

## 2023-12-20 ENCOUNTER — Encounter: Payer: Self-pay | Admitting: Vascular Surgery

## 2023-12-24 ENCOUNTER — Other Ambulatory Visit: Payer: Self-pay

## 2023-12-24 DIAGNOSIS — N401 Enlarged prostate with lower urinary tract symptoms: Secondary | ICD-10-CM

## 2023-12-24 MED ORDER — DEXLANSOPRAZOLE 30 MG PO CPDR
30.0000 mg | DELAYED_RELEASE_CAPSULE | Freq: Two times a day (BID) | ORAL | 0 refills | Status: DC
Start: 2023-12-24 — End: 2024-03-30

## 2023-12-24 MED ORDER — FINASTERIDE 5 MG PO TABS: 5.0000 mg | ORAL_TABLET | Freq: Every day | ORAL | 11 refills | Status: AC

## 2023-12-24 MED ORDER — FAMOTIDINE 40 MG PO TABS: 40.0000 mg | ORAL_TABLET | Freq: Every day | ORAL | 0 refills | Status: DC

## 2023-12-30 ENCOUNTER — Encounter (INDEPENDENT_AMBULATORY_CARE_PROVIDER_SITE_OTHER): Payer: Self-pay | Admitting: Vascular Surgery

## 2023-12-30 ENCOUNTER — Ambulatory Visit (INDEPENDENT_AMBULATORY_CARE_PROVIDER_SITE_OTHER): Payer: Medicare HMO | Admitting: Vascular Surgery

## 2023-12-30 VITALS — BP 101/65 | HR 98 | Resp 20 | Ht 74.0 in | Wt 164.4 lb

## 2023-12-30 DIAGNOSIS — M79606 Pain in leg, unspecified: Secondary | ICD-10-CM | POA: Insufficient documentation

## 2023-12-30 DIAGNOSIS — M549 Dorsalgia, unspecified: Secondary | ICD-10-CM | POA: Insufficient documentation

## 2023-12-30 DIAGNOSIS — I7123 Aneurysm of the descending thoracic aorta, without rupture: Secondary | ICD-10-CM

## 2023-12-30 DIAGNOSIS — M546 Pain in thoracic spine: Secondary | ICD-10-CM

## 2023-12-30 DIAGNOSIS — M79604 Pain in right leg: Secondary | ICD-10-CM

## 2023-12-30 DIAGNOSIS — M79605 Pain in left leg: Secondary | ICD-10-CM

## 2023-12-30 NOTE — Progress Notes (Signed)
 Patient ID: Ricardo Crooked., male   DOB: 1956/03/27, 68 y.o.   MRN: [id number]  Chief Complaint  Patient presents with   Venous Insufficiency    HPI Ricardo Laubenstein. is a 68 y.o. male.    The patient returns to the office for postoperative evaluation of an thoracic aortic aneurysm status post stent graft placement on 12/18/2023.   Procedure:  Placement of a Gore TAG conformable thoracic stent graft in the descending thoracic aorta, using a 34 x 34 x 15 stent with a 40 x 40 x 20 stent and an additional 40 x 40 x 15 stent components  Patient is noting some rib pain associated with midline back pain, no abdominal complaints.  He is also noting a rather dramatic increase in his claudication symptoms.  He now is stating he has having a hard time getting from the bathroom to the family room without stopping.  This is certainly worse compared to his baseline preop.  No symptoms consistent with distal embolization.  No interval development of new ulcers or wounds  There have been no significant interval changes in his overall healthcare since his last visit.   Patient denies amaurosis fugax or TIA symptoms.  The patient denies recent episodes of angina or shortness of breath.     Past Medical History:  Diagnosis Date   Aneurysm of descending thoracic aorta without rupture (HCC)    Aortic atherosclerosis (HCC)    Atrial fibrillation (HCC)    a.) CHA2DS2VASc = 7 (age, HFrEF, HTN, CVA x 2, vascular disease, T2DM) as of 11/28/2023; b.) rate/rhythm maintained on oral digoxin  + metoprolol  succinate; chronically anticoagulated with apixaban    Atypical mycobacterial infection of lung (HCC)    Bipolar disorder (HCC)    BPH (benign prostatic hyperplasia)    CAD (coronary artery disease)    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    Complication of anesthesia    a.) difficulty maintaining adequate sedation/anesthesia; premature emergence; b.) fentanyl  makes patient "mean, grumpy, and  difficult"   COPD (chronic obstructive pulmonary disease) (HCC)    CVA (cerebral vascular accident) (HCC)    DDD (degenerative disc disease), cervical    a.) s/p ACDF C6-C7   Depression    Diverticulosis    Dyspnea    Erectile dysfunction    a.) on PDE5i (sildenafil ) PRN   GERD (gastroesophageal reflux disease)    Headache    Hepatitis C virus infection cured after antiviral drug therapy    a.) s/p completed Tx course of ledipasvir-sofosbuvir   HFrEF (heart failure with reduced ejection fraction) (HCC)    History of bilateral cataract extraction 03/2022   History of kidney stones 2016   History of TB (tuberculosis)    HTN (hypertension)    Hyperlipidemia    Infrarenal abdominal aortic aneurysm (AAA) without rupture (HCC)    Myocardial bridge    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   On apixaban  therapy    Pericarditis    Peripheral vascular disease (HCC)    Pneumonia    Protein calorie malnutrition (HCC)    RA (rheumatoid arthritis) (HCC)    Smoker    T2DM (type 2 diabetes mellitus) (HCC)    Thoracic scoliosis     Past Surgical History:  Procedure Laterality Date   ABDOMINAL AORTOGRAM W/LOWER EXTREMITY N/A 01/15/2017   Procedure: Abdominal Aortogram w/Lower Extremity;  Surgeon: Jackquelyn Mass, MD;  Location: [facility];  Service: Cardiovascular;  Laterality: N/A;   ANTERIOR CERVICAL DECOMP/DISCECTOMY FUSION N/A 2004   FEMORAL-POPLITEAL BYPASS GRAFT Right 06/19/2017   Procedure: BYPASS GRAFT FEMORAL-POPLITEAL ARTERY;  Surgeon: Jackquelyn Mass, MD;  Location: [facility];  Service: Vascular;  Laterality: Right;   FEMORAL-POPLITEAL BYPASS GRAFT     had 2 on right leg , 1 on the left   fractured nose repair     as a child   liver abcess removed      LOWER EXTREMITY ANGIOGRAPHY Right 01/15/2017   Procedure: Lower Extremity Angiography;  Surgeon: Jackquelyn Mass, MD;  Location: [facility];  Service: Cardiovascular;  Laterality:  Right;   LOWER EXTREMITY ANGIOGRAPHY Right 02/13/2017   Procedure: Lower Extremity Angiography;  Surgeon: Jackquelyn Mass, MD;  Location: [facility];  Service: Cardiovascular;  Laterality: Right;   PLACEMENT OF LUMBAR DRAIN N/A 12/18/2023   Procedure: PLACEMENT OF LUMBAR DRAIN;  Surgeon: Carroll Clamp, MD;  Location: [facility];  Service: Neurosurgery;  Laterality: N/A;   THORACIC AORTIC ENDOVASCULAR STENT GRAFT N/A 12/18/2023   Procedure: THORACIC AORTIC ENDOVASCULAR STENT GRAFT;  Surgeon: Jackquelyn Mass, MD;  Location: [facility];  Service: Cardiovascular;  Laterality: N/A;   VASECTOMY N/A       Allergies  Allergen Reactions   Chantix  [Varenicline ] Other (See Comments)    Depression    Current Outpatient Medications  Medication Sig Dispense Refill   acetaminophen  (TYLENOL ) 500 MG tablet Take 1,000 mg by mouth every 6 (six) hours as needed.     albuterol  (VENTOLIN  HFA) 108 (90 Base) MCG/ACT inhaler INHALE 2 PUFFS INTO THE LUNGS EVERY 6 HOURS AS NEEDED FOR WHEEZING OR SHORTNESS OF BREATH. 1 each 2   apixaban  (ELIQUIS ) 5 MG TABS tablet TAKE 1 TABLET TWICE DAILY 180 tablet 0   Dexlansoprazole  30 MG capsule DR Take 1 capsule (30 mg total) by mouth in the morning and at bedtime. 180 capsule 0   digoxin  (LANOXIN ) 0.25 MG tablet TAKE 1 TABLET EVERY DAY 90 tablet 1   DULoxetine  (CYMBALTA ) 60 MG capsule TAKE 1 CAPSULE EVERY DAY 90 capsule 0   empagliflozin  (JARDIANCE ) 10 MG TABS tablet TAKE 1 TABLET EVERY DAY 90 tablet 0   famotidine  (PEPCID ) 40 MG tablet Take 1 tablet (40 mg total) by mouth daily. 90 tablet 0   finasteride  (PROSCAR ) 5 MG tablet Take 1 tablet (5 mg total) by mouth daily. 30 tablet 11   Fluticasone-Umeclidin-Vilant (TRELEGY ELLIPTA ) 100-62.5-25 MCG/ACT AEPB INHALE 1 PUFF INTO THE LUNGS DAILY 180 each 1   Fluticasone-Umeclidin-Vilant (TRELEGY ELLIPTA ) 200-62.5-25 MCG/ACT AEPB Inhale 1 Inhalation into the lungs daily in the afternoon.      gabapentin  (NEURONTIN ) 300 MG capsule TAKE 1 CAPSULE (300MG ) AT BEDTIME. IN ADDITION TO 600MG  FOR A TOTAL OF 900MG  AT BEDTIME 90 capsule 1   gabapentin  (NEURONTIN ) 600 MG tablet TAKE 1 TABLET THREE TIMES DAILY 270 tablet 3   hydrocortisone 1 % ointment Apply 1 Application topically as needed for itching.     metoprolol  succinate (TOPROL -XL) 25 MG 24 hr tablet TAKE 1 TABLET EVERY DAY 90 tablet 1   sildenafil  (VIAGRA ) 50 MG tablet TAKE 1 TABLET EVERY DAY AS NEEDED FOR ERECTILE DYSFUNCTION 90 tablet 0   spironolactone  (ALDACTONE ) 25 MG tablet Take 25 mg by mouth as needed.     tamsulosin  (FLOMAX ) 0.4 MG CAPS capsule Take 1 capsule (0.4 mg total) by mouth daily. 30 capsule 11   varenicline  (CHANTIX ) 1 MG tablet TAKE  1/2 TABLET DAILY FOR 3 DAYS, 1/2 TABLET TWICE DAILY FOR 4 DAYS, THEN TAKE 1 TABLET TWICE DAILY 53 tablet 11   aspirin  EC 81 MG tablet Take 1 tablet (81 mg total) by mouth daily at 6 (six) AM. Swallow whole. (Patient not taking: Reported on 12/30/2023) 90 tablet 12   atorvastatin  (LIPITOR) 80 MG tablet TAKE 1 TABLET EVERY DAY (Patient not taking: Reported on 12/30/2023) 90 tablet 1   oxyCODONE -acetaminophen  (PERCOCET/ROXICET) 5-325 MG tablet Take 1-2 tablets by mouth every 4 (four) hours as needed for moderate pain (pain score 4-6). (Patient not taking: Reported on 12/30/2023) 30 tablet 0   No current facility-administered medications for this visit.        Physical Exam BP 101/65 (BP Location: Left Arm, Patient Position: Sitting, Cuff Size: Normal)   Pulse 98   Resp 20   Ht 6\' 2"  (1.88 m)   Wt 164 lb 6.4 oz (74.6 kg)   BMI 21.11 kg/m  Gen:  WD/WN, NAD his ambulation seems less steady Skin:  Groin incision C/D/I; both feet demonstrate 1 second capillary refill they are warm to touch there are no palpable pulses (this was baseline)     Assessment/Plan: 1. Aneurysm of descending thoracic aorta without rupture (HCC) (Primary) I am concerned given his complaints of chest discomfort  and rib pain as well as his increase in his claudication symptoms.  Given these factors I believe CT angiogram is warranted.  I discussed this with him and he is in agreement.  I will place the order and see him in follow-up once the study has been done.    - CT ANGIO CHEST/ABD/PELVIS POST EVAR W & OR WO CONTRAST; Future  2. Acute midline thoracic back pain See #1 - CT ANGIO CHEST/ABD/PELVIS POST EVAR W & OR WO CONTRAST; Future  3. Pain in both lower extremities See #1 - CT ANGIO CHEST/ABD/PELVIS POST EVAR W & OR WO CONTRAST; Future      [name] 12/30/2023, 2:41 PM   This note was created with Dragon medical transcription system.  Any errors from dictation are unintentional.

## 2024-01-01 ENCOUNTER — Ambulatory Visit: Admission: RE | Admit: 2024-01-01 | Source: Ambulatory Visit

## 2024-01-02 ENCOUNTER — Other Ambulatory Visit: Payer: Self-pay | Admitting: Internal Medicine

## 2024-01-02 ENCOUNTER — Telehealth: Admitting: Physician Assistant

## 2024-01-02 ENCOUNTER — Ambulatory Visit: Payer: Self-pay | Admitting: *Deleted

## 2024-01-02 DIAGNOSIS — R42 Dizziness and giddiness: Secondary | ICD-10-CM | POA: Diagnosis not present

## 2024-01-02 DIAGNOSIS — W19XXXA Unspecified fall, initial encounter: Secondary | ICD-10-CM | POA: Diagnosis not present

## 2024-01-02 DIAGNOSIS — H60541 Acute eczematoid otitis externa, right ear: Secondary | ICD-10-CM

## 2024-01-02 NOTE — Telephone Encounter (Signed)
  Chief Complaint: Dizziness Symptoms: nausea, fall today without injury Frequency: x 2-3 days Pertinent Negatives: Patient denies fever, diarrhea, CP Disposition: [] ED /[] Urgent Care (no appt availability in office) / [x] Appointment(In office/virtual)/ []  [facility]/ [] Home Care/ [] Refused Recommended Disposition /[] [facility]/ []  Follow-up with PCP Additional Notes: Pt reports he has been experiencing dizziness x 2-3 days resulting in a fall today with no injury. Pt notes he is drinking plenty of fluids, notes he was vomiting all day yesterday but this has since resolved and he was able to eat/drink normally today. Pt denies CP, fever, diarrhea, changes in speech, HA, vision changes. Notes SOB at baseline related to COPD. VV scheduled. This RN educated pt on home care, new-worsening symptoms, when to call back/seek emergent care. Pt verbalized understanding and agrees to plan.    Reason for Disposition  [1] Dizziness caused by heat exposure, sudden standing, or poor fluid intake AND [2] no improvement after 2 hours of rest and fluids  Answer Assessment - Initial Assessment Questions 1. DESCRIPTION: "Describe your dizziness."     Dizziness, woozy 2. LIGHTHEADED: "Do you feel lightheaded?" (e.g., somewhat faint, woozy, weak upon standing)     Every time pt changes positions 4. SEVERITY: "How bad is it?"  "Do you feel like you are going to faint?" "Can you stand and walk?"   - MILD: Feels slightly dizzy, but walking normally.   - MODERATE: Feels unsteady when walking, but not falling; interferes with normal activities (e.g., school, work).   - SEVERE: Unable to walk without falling, or requires assistance to walk without falling; feels like passing out now.      Severe 5. ONSET:  "When did the dizziness begin?"     X 2 days ago 6. AGGRAVATING FACTORS: "Does anything make it worse?" (e.g., standing, change in head position)     Change in position 8. CAUSE: "What do  you think is causing the dizziness?"     Unknown 10. OTHER SYMPTOMS: "Do you have any other symptoms?" (e.g., fever, chest pain, vomiting, diarrhea, bleeding)       Vomiting all day yesterday  Protocols used: Dizziness - Lightheadedness-A-AH

## 2024-01-02 NOTE — Progress Notes (Signed)
 Virtual Visit Consent   Ricardo E Rickerson Jr., you are scheduled for a virtual visit with a [facility] provider today. Just as with appointments in the office, your consent must be obtained to participate. Your consent will be active for this visit and any virtual visit you may have with one of our providers in the next 365 days. If you have a MyChart account, a copy of this consent can be sent to you electronically.  As this is a virtual visit, video technology does not allow for your provider to perform a traditional examination. This may limit your provider's ability to fully assess your condition. If your provider identifies any concerns that need to be evaluated in person or the need to arrange testing (such as labs, EKG, etc.), we will make arrangements to do so. Although advances in technology are sophisticated, we cannot ensure that it will always work on either your end or our end. If the connection with a video visit is poor, the visit may have to be switched to a telephone visit. With either a video or telephone visit, we are not always able to ensure that we have a secure connection.  By engaging in this virtual visit, you consent to the provision of healthcare and authorize for your insurance to be billed (if applicable) for the services provided during this visit. Depending on your insurance coverage, you may receive a charge related to this service.  I need to obtain your verbal consent now. Are you willing to proceed with your visit today? Ricardo Crooked. has provided verbal consent on 01/02/2024 for a virtual visit (video or telephone). Ricardo Gutta, PA-C  Date: 01/02/2024 5:24 PM   Virtual Visit via Video Note   I, Ricardo Cervantes, connected with  Ricardo Cervantes  ([id number], 1956-02-07) on 01/02/24 at  4:45 PM EDT by a video-enabled telemedicine application and verified that I am speaking with the correct person using two identifiers.  Location: Patient: Virtual Visit  Location Patient: Home Provider: Virtual Visit Location Provider: Home Office   I discussed the limitations of evaluation and management by telemedicine and the availability of in person appointments. The patient expressed understanding and agreed to proceed.    History of Present Illness: Ricardo Cervantes. is a 68 y.o. who identifies as a male who was assigned male at birth, and is being seen today for fall/dizziness.  HPI: 68 y/o M with h/o Descending aortic aneurysm s/p recent stent and  A-fib states earlier today he fell for "no appearent reason" while opening the refrigerator. He felt dizzy prior to his fall. He was seen by a home nurse. Denies tripping. Denies LOC.   Dizziness  Fall    Problems:  Patient Active Problem List   Diagnosis Date Noted   Back pain 12/30/2023   Leg pain 12/30/2023   Thoracic aortic aneurysm without rupture (HCC) 12/18/2023   Thoracic aortic aneurysm (TAA) (HCC) 12/18/2023   AAA (abdominal aortic aneurysm) without rupture (HCC) 09/29/2023   Multiple lung nodules on CT 09/20/2023   Aneurysm of descending thoracic aorta without rupture (HCC) 09/20/2023   Tobacco dependence due to cigarettes 09/20/2023   Depression 04/12/2023   Type 2 diabetes mellitus with diabetic peripheral angiopathy without gangrene, without long-term current use of insulin (HCC) 01/24/2023   Peripheral neuropathy 12/14/2021   BPH (benign prostatic hyperplasia) 12/14/2021   GERD (gastroesophageal reflux disease) 07/31/2021   Type 2 diabetes mellitus treated without insulin (HCC) 07/31/2021   Stage 3  severe COPD by GOLD classification (HCC) 10/18/2020   HFrEF (heart failure with reduced ejection fraction) (HCC) 10/18/2020   History of stroke with residual effects 10/18/2020   Atypical mycobacterial infection of lung (HCC) 10/18/2020   Atrial fibrillation (HCC) 09/04/2020   Hyperlipidemia associated with type 2 diabetes mellitus (HCC) 07/04/2017   Atherosclerosis of native  arteries of extremity with intermittent claudication (HCC) 06/19/2017   Erectile dysfunction 12/23/2015    Allergies:  Allergies  Allergen Reactions   Chantix  [Varenicline ] Other (See Comments)    Depression   Medications:  Current Outpatient Medications:    acetaminophen  (TYLENOL ) 500 MG tablet, Take 1,000 mg by mouth every 6 (six) hours as needed., Disp: , Rfl:    albuterol  (VENTOLIN  HFA) 108 (90 Base) MCG/ACT inhaler, INHALE 2 PUFFS INTO THE LUNGS EVERY 6 HOURS AS NEEDED FOR WHEEZING OR SHORTNESS OF BREATH., Disp: 1 each, Rfl: 2   apixaban  (ELIQUIS ) 5 MG TABS tablet, TAKE 1 TABLET TWICE DAILY, Disp: 180 tablet, Rfl: 0   aspirin  EC 81 MG tablet, Take 1 tablet (81 mg total) by mouth daily at 6 (six) AM. Swallow whole. (Patient not taking: Reported on 12/30/2023), Disp: 90 tablet, Rfl: 12   atorvastatin  (LIPITOR) 80 MG tablet, TAKE 1 TABLET EVERY DAY (Patient not taking: Reported on 12/30/2023), Disp: 90 tablet, Rfl: 1   Dexlansoprazole  30 MG capsule DR, Take 1 capsule (30 mg total) by mouth in the morning and at bedtime., Disp: 180 capsule, Rfl: 0   digoxin  (LANOXIN ) 0.25 MG tablet, TAKE 1 TABLET EVERY DAY, Disp: 90 tablet, Rfl: 1   DULoxetine  (CYMBALTA ) 60 MG capsule, TAKE 1 CAPSULE EVERY DAY, Disp: 90 capsule, Rfl: 0   empagliflozin  (JARDIANCE ) 10 MG TABS tablet, TAKE 1 TABLET EVERY DAY, Disp: 90 tablet, Rfl: 0   famotidine  (PEPCID ) 40 MG tablet, Take 1 tablet (40 mg total) by mouth daily., Disp: 90 tablet, Rfl: 0   finasteride  (PROSCAR ) 5 MG tablet, Take 1 tablet (5 mg total) by mouth daily., Disp: 30 tablet, Rfl: 11   Fluticasone-Umeclidin-Vilant (TRELEGY ELLIPTA ) 100-62.5-25 MCG/ACT AEPB, INHALE 1 PUFF INTO THE LUNGS DAILY, Disp: 180 each, Rfl: 1   Fluticasone-Umeclidin-Vilant (TRELEGY ELLIPTA ) 200-62.5-25 MCG/ACT AEPB, Inhale 1 Inhalation into the lungs daily in the afternoon., Disp: , Rfl:    gabapentin  (NEURONTIN ) 300 MG capsule, TAKE 1 CAPSULE (300MG ) AT BEDTIME. IN ADDITION TO 600MG   FOR A TOTAL OF 900MG  AT BEDTIME, Disp: 90 capsule, Rfl: 1   gabapentin  (NEURONTIN ) 600 MG tablet, TAKE 1 TABLET THREE TIMES DAILY, Disp: 270 tablet, Rfl: 3   hydrocortisone 1 % ointment, Apply 1 Application topically as needed for itching., Disp: , Rfl:    metoprolol  succinate (TOPROL -XL) 25 MG 24 hr tablet, TAKE 1 TABLET EVERY DAY, Disp: 90 tablet, Rfl: 1   oxyCODONE -acetaminophen  (PERCOCET/ROXICET) 5-325 MG tablet, Take 1-2 tablets by mouth every 4 (four) hours as needed for moderate pain (pain score 4-6). (Patient not taking: Reported on 12/30/2023), Disp: 30 tablet, Rfl: 0   sildenafil  (VIAGRA ) 50 MG tablet, TAKE 1 TABLET EVERY DAY AS NEEDED FOR ERECTILE DYSFUNCTION, Disp: 90 tablet, Rfl: 0   spironolactone  (ALDACTONE ) 25 MG tablet, Take 25 mg by mouth as needed., Disp: , Rfl:    tamsulosin  (FLOMAX ) 0.4 MG CAPS capsule, Take 1 capsule (0.4 mg total) by mouth daily., Disp: 30 capsule, Rfl: 11   varenicline  (CHANTIX ) 1 MG tablet, TAKE 1/2 TABLET DAILY FOR 3 DAYS, 1/2 TABLET TWICE DAILY FOR 4 DAYS, THEN TAKE 1 TABLET TWICE DAILY,  Disp: 53 tablet, Rfl: 11  Observations/Objective: Patient is well-developed, well-nourished in no acute distress.  Resting comfortably  at home currently.  Head is normocephalic, atraumatic.  No labored breathing. Speech is clear and coherent with logical content.  Patient is alert and oriented at baseline.    Assessment and Plan: 1. Fall, initial encounter (Primary) r/o underyling cardiac abnormality vs neuro h/o A-fib   2. Dizziness  Go to ER immediately for further workup. I recommended to call 911 and take him, but patient refused.  He states he is feeling better right now than earlier today. He tells me that he will go to ER if his symptoms worsen.  I again strongly recommended to be seen in ER for further evaluation and management. Patient again declined.  I'm concerned for his recent fall which maybe due to cardiac history of A-fib.  I explained that he  needs further work up because of his cardiac history or possible neurogenic cause and needs further evaluation in the ER. He verbalized understanding, but declined to go.  I explained he can be seen at an UC clinic, but refused to go for the long wait times.   Follow Up Instructions: I discussed the assessment and treatment plan with the patient. The patient was provided an opportunity to ask questions and all were answered. The patient agreed with the plan and demonstrated an understanding of the instructions.  A copy of instructions were sent to the patient via MyChart unless otherwise noted below.   Patient has requested to receive PHI (AVS, Work Notes, etc) pertaining to this video visit through e-mail as they are currently without active MyChart. They have voiced understand that email is not considered secure and their health information could be viewed by someone other than the patient.   The patient was advised to call back or seek an in-person evaluation if the symptoms worsen or if the condition fails to improve as anticipated.    Azelia Reiger, PA-C

## 2024-01-02 NOTE — Telephone Encounter (Signed)
 First attempt to return call that was requested by [facility] with [facility].   Left a voicemail to call [facility] with the phone number.    Routed to call back basket for further attempts.

## 2024-01-02 NOTE — Telephone Encounter (Signed)
 Second attempt to return call.  Left a voicemail to call back.   Routed to call back basket for further attempts.

## 2024-01-02 NOTE — Patient Instructions (Signed)
 Ricardo E Forgy Jr., thank you for joining Wanita Gutta, PA-C for today's virtual visit.  While this provider is not your primary care provider (PCP), if your PCP is located in our provider database this encounter information will be shared with them immediately following your visit.   A [facility] MyChart account gives you access to today's visit and all your visits, tests, and labs performed at [facility] " click here if you don't have a [facility] MyChart account or go to [email or web address]  Consent: (Patient) Ricardo Cervantes. provided verbal consent for this virtual visit at the beginning of the encounter.  Current Medications:  Current Outpatient Medications:    acetaminophen  (TYLENOL ) 500 MG tablet, Take 1,000 mg by mouth every 6 (six) hours as needed., Disp: , Rfl:    albuterol  (VENTOLIN  HFA) 108 (90 Base) MCG/ACT inhaler, INHALE 2 PUFFS INTO THE LUNGS EVERY 6 HOURS AS NEEDED FOR WHEEZING OR SHORTNESS OF BREATH., Disp: 1 each, Rfl: 2   apixaban  (ELIQUIS ) 5 MG TABS tablet, TAKE 1 TABLET TWICE DAILY, Disp: 180 tablet, Rfl: 0   aspirin  EC 81 MG tablet, Take 1 tablet (81 mg total) by mouth daily at 6 (six) AM. Swallow whole. (Patient not taking: Reported on 12/30/2023), Disp: 90 tablet, Rfl: 12   atorvastatin  (LIPITOR) 80 MG tablet, TAKE 1 TABLET EVERY DAY (Patient not taking: Reported on 12/30/2023), Disp: 90 tablet, Rfl: 1   Dexlansoprazole  30 MG capsule DR, Take 1 capsule (30 mg total) by mouth in the morning and at bedtime., Disp: 180 capsule, Rfl: 0   digoxin  (LANOXIN ) 0.25 MG tablet, TAKE 1 TABLET EVERY DAY, Disp: 90 tablet, Rfl: 1   DULoxetine  (CYMBALTA ) 60 MG capsule, TAKE 1 CAPSULE EVERY DAY, Disp: 90 capsule, Rfl: 0   empagliflozin  (JARDIANCE ) 10 MG TABS tablet, TAKE 1 TABLET EVERY DAY, Disp: 90 tablet, Rfl: 0   famotidine  (PEPCID ) 40 MG tablet, Take 1 tablet (40 mg total) by mouth daily., Disp: 90 tablet, Rfl: 0   finasteride  (PROSCAR ) 5 MG tablet, Take 1  tablet (5 mg total) by mouth daily., Disp: 30 tablet, Rfl: 11   Fluticasone-Umeclidin-Vilant (TRELEGY ELLIPTA ) 100-62.5-25 MCG/ACT AEPB, INHALE 1 PUFF INTO THE LUNGS DAILY, Disp: 180 each, Rfl: 1   Fluticasone-Umeclidin-Vilant (TRELEGY ELLIPTA ) 200-62.5-25 MCG/ACT AEPB, Inhale 1 Inhalation into the lungs daily in the afternoon., Disp: , Rfl:    gabapentin  (NEURONTIN ) 300 MG capsule, TAKE 1 CAPSULE (300MG ) AT BEDTIME. IN ADDITION TO 600MG  FOR A TOTAL OF 900MG  AT BEDTIME, Disp: 90 capsule, Rfl: 1   gabapentin  (NEURONTIN ) 600 MG tablet, TAKE 1 TABLET THREE TIMES DAILY, Disp: 270 tablet, Rfl: 3   hydrocortisone 1 % ointment, Apply 1 Application topically as needed for itching., Disp: , Rfl:    metoprolol  succinate (TOPROL -XL) 25 MG 24 hr tablet, TAKE 1 TABLET EVERY DAY, Disp: 90 tablet, Rfl: 1   oxyCODONE -acetaminophen  (PERCOCET/ROXICET) 5-325 MG tablet, Take 1-2 tablets by mouth every 4 (four) hours as needed for moderate pain (pain score 4-6). (Patient not taking: Reported on 12/30/2023), Disp: 30 tablet, Rfl: 0   sildenafil  (VIAGRA ) 50 MG tablet, TAKE 1 TABLET EVERY DAY AS NEEDED FOR ERECTILE DYSFUNCTION, Disp: 90 tablet, Rfl: 0   spironolactone  (ALDACTONE ) 25 MG tablet, Take 25 mg by mouth as needed., Disp: , Rfl:    tamsulosin  (FLOMAX ) 0.4 MG CAPS capsule, Take 1 capsule (0.4 mg total) by mouth daily., Disp: 30 capsule, Rfl: 11   varenicline  (CHANTIX ) 1 MG tablet, TAKE 1/2 TABLET DAILY FOR  3 DAYS, 1/2 TABLET TWICE DAILY FOR 4 DAYS, THEN TAKE 1 TABLET TWICE DAILY, Disp: 53 tablet, Rfl: 11   Medications ordered in this encounter:  No orders of the defined types were placed in this encounter.    *If you need refills on other medications prior to your next appointment, please contact your pharmacy*  Follow-Up: Call back or seek an in-person evaluation if the symptoms worsen or if the condition fails to improve as anticipated.  [facility] [phone]  Other Instructions Go to ER  immediately for further workup. I recommended to call 911 and take him, but patient refused.  He states he is feeling better right now than earlier today. He tells me that he will go to ER if his symptoms worsen.  I again strongly recommended to be seen in ER for further evaluation and management. Patient again declined.  I'm concerned for his recent fall which maybe due to cardiac history of A-fib.  I explained that he needs further work up because of his cardiac history or possible neurogenic cause and needs further evaluation in the ER. He verbalized understanding, but declined to go.  Go to [facility] clinic for further evaluation, but patient declined due to wait times.    If you have been instructed to have an in-person evaluation today at a local Urgent Care facility, please use the link below. It will take you to a list of all of our available [facility], including address, phone number and hours of operation. Please do not delay care.  [facility]  If you or a family member do not have a primary care provider, use the link below to schedule a visit and establish care. When you choose a [facility] primary care physician or advanced practice provider, you gain a long-term partner in health. Find a Primary Care Provider  Learn more about [facility]'s in-office and virtual care options:  - Get Care Now

## 2024-01-02 NOTE — Telephone Encounter (Signed)
 Copied from CRM [id number]. Topic: Clinical - Medical Advice >> Jan 02, 2024  1:55 PM Loreda Rodriguez T wrote: Reason for CRM: Georjean Kite from [employer] [facility] [employer] [phone] called stated patient seemed dehydrated today, he also fell a couple of days ago and has been experiencing some nausea and dizziness. His BP was 101/72 and nurse would like for a call to be made to him today.

## 2024-01-03 ENCOUNTER — Telehealth (INDEPENDENT_AMBULATORY_CARE_PROVIDER_SITE_OTHER): Payer: Self-pay | Admitting: Vascular Surgery

## 2024-01-03 NOTE — Telephone Encounter (Signed)
 Spoke to patient. Gave him the number to Radiology scheduling for him to call and make the appt ordered by Dr. Prescilla Brod. I advised for him to call back to our office after making the CT appt in order to make a CT results appt with Dr. Prescilla Brod. Patient acknowledged.

## 2024-01-05 NOTE — Telephone Encounter (Signed)
 OV needed for additional refills.  Requested Prescriptions  Pending Prescriptions Disp Refills   TRELEGY ELLIPTA  100-62.5-25 MCG/ACT AEPB [Pharmacy Med Name: Trelegy Ellipta  Inhalation Aerosol Powder Breath Activated 100-62.5-25 MCG/ACT] 180 each 3    Sig: INHALE 1 PUFF INTO THE LUNGS DAILY     Off-Protocol Failed - 01/05/2024  9:55 AM      Failed - Medication not assigned to a protocol, review manually.      Failed - Valid encounter within last 12 months    Recent Outpatient Visits   None     Future Appointments             In 2 days Baity, Rankin Buzzard, NP [place] [facility], [place]   In 1 month Dustin Gimenez, MD [facility] [place]             metoprolol  succinate (TOPROL -XL) 25 MG 24 hr tablet [Pharmacy Med Name: Metoprolol  Succinate ER Oral Tablet Extended Release 24 Hour 25 MG] 90 tablet 0    Sig: TAKE 1 TABLET EVERY DAY     Cardiovascular:  Beta Blockers Failed - 01/05/2024  9:55 AM      Failed - Valid encounter within last 6 months    Recent Outpatient Visits   None     Future Appointments             In 2 days Carollynn Cirri, NP [facility]   In 1 month Dustin Gimenez, MD [facility] [place]            Passed - Last BP in normal range    BP Readings from Last 1 Encounters:  12/30/23 101/65         Passed - Last Heart Rate in normal range    Pulse Readings from Last 1 Encounters:  12/30/23 98          empagliflozin  (JARDIANCE ) 10 MG TABS tablet [Pharmacy Med Name: Jardiance  Oral Tablet 10 MG] 90 tablet 0    Sig: TAKE 1 TABLET EVERY DAY     Endocrinology:  Diabetes - SGLT2 Inhibitors Failed - 01/05/2024  9:55 AM      Failed - Valid encounter within last 6 months    Recent Outpatient Visits   None     Future Appointments             In 2 days Carollynn Cirri, NP [place] [facility], [facility]   In 1 month Dustin Gimenez, MD [facility] [place]             Passed - Cr in normal range and within 360 days    Creat  Date Value Ref Range Status  07/09/2023 0.82 0.70 - 1.35 mg/dL Final   Creatinine, Ser  Date Value Ref Range Status  12/19/2023 0.76 0.61 - 1.24 mg/dL Final   Creatinine, Urine  Date Value Ref Range Status  04/22/2023 86 20 - 320 mg/dL Final         Passed - HBA1C is between 0 and 7.9 and within 180 days    Hgb A1c MFr Bld  Date Value Ref Range Status  07/09/2023 6.3 (H) <5.7 % of total Hgb Final    Comment:    For someone without known diabetes, a hemoglobin  A1c value between 5.7% and 6.4% is consistent with prediabetes and should be confirmed with a  follow-up test. . For someone with known diabetes, a value <  7% indicates that their diabetes is well controlled. A1c targets should be individualized based on duration of diabetes, age, comorbid conditions, and other considerations. . This assay result is consistent with an increased risk of diabetes. . Currently, no consensus exists regarding use of hemoglobin A1c for diagnosis of diabetes for children. .          Passed - eGFR in normal range and within 360 days    GFR, Est African American  Date Value Ref Range Status  12/18/2017 109 > OR = 60 mL/min/1.60m2 Final   GFR, Est Non African American  Date Value Ref Range Status  12/18/2017 94 > OR = 60 mL/min/1.41m2 Final   GFR, Estimated  Date Value Ref Range Status  12/19/2023 >60 >60 mL/min Final    Comment:    (NOTE) Calculated using the CKD-EPI Creatinine Equation (2021)    eGFR  Date Value Ref Range Status  07/09/2023 97 > OR = 60 mL/min/1.38m2 Final  11/14/2020 105 >59 mL/min/1.73 Final          apixaban  (ELIQUIS ) 5 MG TABS tablet [Pharmacy Med Name: Eliquis  Oral Tablet 5 MG] 180 tablet 0    Sig: TAKE 1 TABLET TWICE DAILY     Hematology:  Anticoagulants - apixaban  Failed - 01/05/2024  9:55 AM      Failed - HGB in normal range and within 360 days    Hemoglobin  Date Value Ref Range Status   12/19/2023 12.7 (L) 13.0 - 17.0 g/dL Final  95/28/4132 44.0 (L) 13.0 - 17.7 g/dL Final         Failed - Valid encounter within last 12 months    Recent Outpatient Visits   None     Future Appointments             In 2 days Baity, Rankin Buzzard, NP [place] [facility], [facility]   In 1 month Dustin Gimenez, MD [facility] [place]            Passed - PLT in normal range and within 360 days    Platelets  Date Value Ref Range Status  12/19/2023 160 150 - 400 K/uL Final  11/14/2020 287 150 - 450 x10E3/uL Final         Passed - HCT in normal range and within 360 days    HCT  Date Value Ref Range Status  12/19/2023 39.4 39.0 - 52.0 % Final   Hematocrit  Date Value Ref Range Status  11/14/2020 40.0 37.5 - 51.0 % Final         Passed - Cr in normal range and within 360 days    Creat  Date Value Ref Range Status  07/09/2023 0.82 0.70 - 1.35 mg/dL Final   Creatinine, Ser  Date Value Ref Range Status  12/19/2023 0.76 0.61 - 1.24 mg/dL Final   Creatinine, Urine  Date Value Ref Range Status  04/22/2023 86 20 - 320 mg/dL Final         Passed - AST in normal range and within 360 days    AST  Date Value Ref Range Status  07/09/2023 20 10 - 35 U/L Final         Passed - ALT in normal range and within 360 days    ALT  Date Value Ref Range Status  07/09/2023 27 9 - 46 U/L Final

## 2024-01-05 NOTE — Telephone Encounter (Signed)
 Requested medication (s) are due for refill today: yes  Requested medication (s) are on the active medication list: yes  Last refill:  08/05/23  Future visit scheduled: yes  Notes to clinic:  Medication not assigned to a protocol, review manually.      Requested Prescriptions  Pending Prescriptions Disp Refills   TRELEGY ELLIPTA  100-62.5-25 MCG/ACT AEPB [Pharmacy Med Name: Trelegy Ellipta  Inhalation Aerosol Powder Breath Activated 100-62.5-25 MCG/ACT] 180 each 3    Sig: INHALE 1 PUFF INTO THE LUNGS DAILY     Off-Protocol Failed - 01/05/2024  9:57 AM      Failed - Medication not assigned to a protocol, review manually.      Failed - Valid encounter within last 12 months    Recent Outpatient Visits   None     Future Appointments             In 2 days Baity, Rankin Buzzard, NP [facility], [place]   In 1 month Dustin Gimenez, MD [facility] [place]            Signed Prescriptions Disp Refills   metoprolol  succinate (TOPROL -XL) 25 MG 24 hr tablet 90 tablet 0    Sig: TAKE 1 TABLET EVERY DAY     Cardiovascular:  Beta Blockers Failed - 01/05/2024  9:57 AM      Failed - Valid encounter within last 6 months    Recent Outpatient Visits   None     Future Appointments             In 2 days Carollynn Cirri, NP [place] [facility], [facility]   In 1 month Dustin Gimenez, MD [facility] [place]            Passed - Last BP in normal range    BP Readings from Last 1 Encounters:  12/30/23 101/65         Passed - Last Heart Rate in normal range    Pulse Readings from Last 1 Encounters:  12/30/23 98          empagliflozin  (JARDIANCE ) 10 MG TABS tablet 90 tablet 0    Sig: TAKE 1 TABLET EVERY DAY     Endocrinology:  Diabetes - SGLT2 Inhibitors Failed - 01/05/2024  9:57 AM      Failed - Valid encounter within last 6 months    Recent Outpatient Visits   None     Future Appointments             In 2 days Carollynn Cirri,  NP  [place]   In 1 month Dustin Gimenez, MD [facility] [place]            Passed - Cr in normal range and within 360 days    Creat  Date Value Ref Range Status  07/09/2023 0.82 0.70 - 1.35 mg/dL Final   Creatinine, Ser  Date Value Ref Range Status  12/19/2023 0.76 0.61 - 1.24 mg/dL Final   Creatinine, Urine  Date Value Ref Range Status  04/22/2023 86 20 - 320 mg/dL Final         Passed - HBA1C is between 0 and 7.9 and within 180 days    Hgb A1c MFr Bld  Date Value Ref Range Status  07/09/2023 6.3 (H) <5.7 % of total Hgb Final    Comment:    For someone without known diabetes, a hemoglobin  A1c value  between 5.7% and 6.4% is consistent with prediabetes and should be confirmed with a  follow-up test. . For someone with known diabetes, a value <7% indicates that their diabetes is well controlled. A1c targets should be individualized based on duration of diabetes, age, comorbid conditions, and other considerations. . This assay result is consistent with an increased risk of diabetes. . Currently, no consensus exists regarding use of hemoglobin A1c for diagnosis of diabetes for children. .          Passed - eGFR in normal range and within 360 days    GFR, Est African American  Date Value Ref Range Status  12/18/2017 109 > OR = 60 mL/min/1.23m2 Final   GFR, Est Non African American  Date Value Ref Range Status  12/18/2017 94 > OR = 60 mL/min/1.46m2 Final   GFR, Estimated  Date Value Ref Range Status  12/19/2023 >60 >60 mL/min Final    Comment:    (NOTE) Calculated using the CKD-EPI Creatinine Equation (2021)    eGFR  Date Value Ref Range Status  07/09/2023 97 > OR = 60 mL/min/1.37m2 Final  11/14/2020 105 >59 mL/min/1.73 Final          apixaban  (ELIQUIS ) 5 MG TABS tablet 180 tablet 0    Sig: TAKE 1 TABLET TWICE DAILY     Hematology:  Anticoagulants - apixaban  Failed - 01/05/2024  9:57 AM      Failed - HGB  in normal range and within 360 days    Hemoglobin  Date Value Ref Range Status  12/19/2023 12.7 (L) 13.0 - 17.0 g/dL Final  16/06/9603 54.0 (L) 13.0 - 17.7 g/dL Final         Failed - Valid encounter within last 12 months    Recent Outpatient Visits   None     Future Appointments             In 2 days Carollynn Cirri, NP [facility], [facility]   In 1 month Dustin Gimenez, MD [facility] [place]            Passed - PLT in normal range and within 360 days    Platelets  Date Value Ref Range Status  12/19/2023 160 150 - 400 K/uL Final  11/14/2020 287 150 - 450 x10E3/uL Final         Passed - HCT in normal range and within 360 days    HCT  Date Value Ref Range Status  12/19/2023 39.4 39.0 - 52.0 % Final   Hematocrit  Date Value Ref Range Status  11/14/2020 40.0 37.5 - 51.0 % Final         Passed - Cr in normal range and within 360 days    Creat  Date Value Ref Range Status  07/09/2023 0.82 0.70 - 1.35 mg/dL Final   Creatinine, Ser  Date Value Ref Range Status  12/19/2023 0.76 0.61 - 1.24 mg/dL Final   Creatinine, Urine  Date Value Ref Range Status  04/22/2023 86 20 - 320 mg/dL Final         Passed - AST in normal range and within 360 days    AST  Date Value Ref Range Status  07/09/2023 20 10 - 35 U/L Final         Passed - ALT in normal range and within 360 days    ALT  Date Value Ref Range Status  07/09/2023 27 9 - 46 U/L Final

## 2024-01-06 ENCOUNTER — Ambulatory Visit: Payer: Self-pay | Admitting: Internal Medicine

## 2024-01-06 ENCOUNTER — Ambulatory Visit
Admission: RE | Admit: 2024-01-06 | Discharge: 2024-01-06 | Disposition: A | Discharge status: Home / Self Care | Source: Ambulatory Visit | Attending: Vascular Surgery | Admitting: Vascular Surgery

## 2024-01-06 DIAGNOSIS — M79605 Pain in left leg: Secondary | ICD-10-CM | POA: Diagnosis present

## 2024-01-06 DIAGNOSIS — M546 Pain in thoracic spine: Secondary | ICD-10-CM | POA: Diagnosis present

## 2024-01-06 DIAGNOSIS — M79604 Pain in right leg: Secondary | ICD-10-CM | POA: Insufficient documentation

## 2024-01-06 DIAGNOSIS — I7123 Aneurysm of the descending thoracic aorta, without rupture: Secondary | ICD-10-CM | POA: Diagnosis present

## 2024-01-06 MED ORDER — IOHEXOL 350 MG/ML SOLN
100.0000 mL | Freq: Once | INTRAVENOUS | Status: AC | PRN
  Administered 2024-01-06: 100 mL via INTRAVENOUS

## 2024-01-07 ENCOUNTER — Encounter: Payer: Self-pay | Admitting: Internal Medicine

## 2024-01-07 ENCOUNTER — Ambulatory Visit: Admitting: Internal Medicine

## 2024-01-07 VITALS — BP 98/64 | Ht 74.0 in | Wt 161.4 lb

## 2024-01-07 DIAGNOSIS — I482 Chronic atrial fibrillation, unspecified: Secondary | ICD-10-CM

## 2024-01-07 DIAGNOSIS — I6930 Unspecified sequelae of cerebral infarction: Secondary | ICD-10-CM

## 2024-01-07 DIAGNOSIS — I712 Thoracic aortic aneurysm, without rupture, unspecified: Secondary | ICD-10-CM

## 2024-01-07 DIAGNOSIS — N401 Enlarged prostate with lower urinary tract symptoms: Secondary | ICD-10-CM

## 2024-01-07 DIAGNOSIS — E119 Type 2 diabetes mellitus without complications: Secondary | ICD-10-CM

## 2024-01-07 DIAGNOSIS — F3341 Major depressive disorder, recurrent, in partial remission: Secondary | ICD-10-CM

## 2024-01-07 DIAGNOSIS — E118 Type 2 diabetes mellitus with unspecified complications: Secondary | ICD-10-CM

## 2024-01-07 DIAGNOSIS — J449 Chronic obstructive pulmonary disease, unspecified: Secondary | ICD-10-CM

## 2024-01-07 DIAGNOSIS — Z7984 Long term (current) use of oral hypoglycemic drugs: Secondary | ICD-10-CM

## 2024-01-07 DIAGNOSIS — E1169 Type 2 diabetes mellitus with other specified complication: Secondary | ICD-10-CM

## 2024-01-07 DIAGNOSIS — N529 Male erectile dysfunction, unspecified: Secondary | ICD-10-CM

## 2024-01-07 DIAGNOSIS — I70213 Atherosclerosis of native arteries of extremities with intermittent claudication, bilateral legs: Secondary | ICD-10-CM | POA: Diagnosis not present

## 2024-01-07 DIAGNOSIS — K219 Gastro-esophageal reflux disease without esophagitis: Secondary | ICD-10-CM

## 2024-01-07 DIAGNOSIS — I5020 Unspecified systolic (congestive) heart failure: Secondary | ICD-10-CM

## 2024-01-07 DIAGNOSIS — E785 Hyperlipidemia, unspecified: Secondary | ICD-10-CM

## 2024-01-07 DIAGNOSIS — I7143 Infrarenal abdominal aortic aneurysm, without rupture: Secondary | ICD-10-CM

## 2024-01-07 MED ORDER — DIGOXIN 250 MCG PO TABS
250.0000 ug | ORAL_TABLET | Freq: Every day | ORAL | 1 refills | Status: AC
Start: 2024-01-07 — End: ?

## 2024-01-07 MED ORDER — SPIRONOLACTONE 25 MG PO TABS: 12.5000 mg | ORAL_TABLET | ORAL | 1 refills | Status: DC | PRN

## 2024-01-07 NOTE — Assessment & Plan Note (Signed)
 He will continue to follow with vascular

## 2024-01-07 NOTE — Assessment & Plan Note (Signed)
 Continue atorvastatin , metoprolol  and eliquis   lipid profile ordered

## 2024-01-07 NOTE — Assessment & Plan Note (Signed)
 lipid profile ordered Encouraged him to consume a low-fat diet Continue atorvastatin 

## 2024-01-07 NOTE — Assessment & Plan Note (Signed)
 Continue sildenafil as needed.

## 2024-01-07 NOTE — Assessment & Plan Note (Signed)
 A1c today Urine microalbumin has been checked within the last year Encourage low-carb diet  Continue Jardience and Gabapentin  Encourage routine eye exam Encouraged routine foot exam He declines pneumonia vaccine

## 2024-01-07 NOTE — Assessment & Plan Note (Signed)
Continue duloxetine Support offered

## 2024-01-07 NOTE — Patient Instructions (Signed)
 GERD in Adults: Diet Changes When you have gastroesophageal reflux disease (GERD), you may need to make changes to your diet. Choosing the right foods can help with your symptoms. Think about working with an expert in healthy eating called a dietitian. They can help you make healthy food choices. What are tips for following this plan? Reading food labels Look for foods that are low in saturated fat. Foods that may help with your symptoms include: Foods with less than 5% of daily value (DV) of fat. Foods with 0 grams of trans fat. Cooking [employer] in ways that don't use a lot of fat. These ways include: Baking. Steaming. Grilling. Broiling. To add flavor, try to use herbs that are low in spice and acidity. Avoid frying your food. Meal planning  Eat small meals often rather than eating 3 large meals each day. Eat your meals slowly in a place where you feel relaxed. If told by your health care provider, avoid: Foods that cause symptoms. Keep a food diary to keep track of foods that cause symptoms. Alcohol. Drinking a lot of liquid with meals. General instructions For 2-3 hours after you eat, avoid: Bending over. Exercise. Lying down. Chew sugar-free gum after meals. What foods should I eat? Eat a healthy diet. Try to include: Foods with high amounts of fiber. These include: Fruits and vegetables. Whole grains and beans. Low-fat dairy products. Lean meats, fish, and poultry. Egg whites. Foods that cause symptoms in someone else may not cause symptoms for you. Work with your provider to find foods that are safe for you. The items listed above may not be all the foods and drinks you can have. Talk with a dietitian to learn more. The items listed above may not be a complete list of foods and beverages you can eat and drink. Contact a dietitian for more information. What foods should I avoid? Limiting some of these foods may help with your symptoms. Each person is different.  Talk with a dietitian or your provider to help you find the exact foods to avoid. Some of the foods to avoid may include: Fruits Fruits with a lot of acid in them. These may include citrus fruits, such as oranges, grapefruit, pineapple, and lemons. Vegetables Deep-fried vegetables, such as [place] fries. Vegetables, sauces, or toppings made with added fat and vegetables with acid in them. These may include tomatoes and tomato products, chili peppers, onions, garlic, and horseradish. Grains Pastries or quick breads with added fat. Meats and other proteins High-fat meats, such as fatty beef or pork, hot dogs, ribs, ham, sausage, salami, and bacon. Fried meat or protein, such as fried fish and fried chicken. Egg yolks. Fats and oils Butter. Margarine. Shortening. Ghee. Drinks Coffee and other drinks with caffeine in them. Fizzy and sugary drinks, such as soda and energy drinks. Fruit juice made with acidic fruits, such as orange or grapefruit. Tomato juice. Sweets and desserts Chocolate and cocoa. Donuts. Seasonings and condiments Mint, such as peppermint and spearmint. Condiments, herbs, or seasonings that cause symptoms. These may include curry, hot sauce, or vinegar-based salad dressings. The items listed above may not be all the foods and drinks you should avoid. Talk with a dietitian to learn more. Questions to ask your health care provider Changes to your diet and everyday life are often the first steps taken to manage symptoms of GERD. If these changes don't help, talk with your provider about taking medicines. Where to find more information [employer] G[employer]intestinal [employer]:  aboutgerd.org This information is not intended to replace advice given to you by your health care provider. Make sure you discuss any questions you have with your health care provider. Document Revised: 07/02/2023 Document Reviewed: 01/16/2023 Elsevier Patient Education  2024 [employer].

## 2024-01-07 NOTE — Assessment & Plan Note (Signed)
 Managed on metoprolol  and eliquis  We will refill his digoxin  and encouraged him to take this as prescribed He will continue to follow with cardiology

## 2024-01-07 NOTE — Assessment & Plan Note (Signed)
 Continue tamsulosin , finasteride  as prescribed We will monitor

## 2024-01-07 NOTE — Assessment & Plan Note (Signed)
Follows with vascular

## 2024-01-07 NOTE — Assessment & Plan Note (Signed)
 Encourage smoking cessation Continue atorvastatin , metoprolol  and eliquis  Lipid profile ordered

## 2024-01-07 NOTE — Progress Notes (Signed)
 Subjective:    Patient ID: Ricardo Cervantes., male    DOB: 04/01/1956, 68 y.o.   MRN: [id number]  HPI  Patient presents to clinic today for follow-up of chronic conditions.  A-fib: Chronic, managed on metoprolol  and eliquis , but he does not think he has been take his digoxin .  ECG from 12/2023 reviewed.  He follows with cardiology.  CHF: He has chronic shortness of breath but he denies lower extremity edema.  He is taking metoprolol  but is not taking spironolactone .  Echo from 11/2023 reviewed- care everywhere. He follows with cardiology.  COPD: He reports chronic cough and shortness of breath.  He is taking trelegy and albuterol  as prescribed.  There are no PFTs on file. He does continue to smoke. He follows with pulmonology.  GERD: He is not sure what triggers this.  He reports frequent breakthrough and nausea on dexlansoprazole  and famotidine .  There is no upper GI on file.  He has been seeing GI but would like a second opinion at this time.  DM 2 with Peripheral neuropathy: His last A1c was 6.3%, 07/2023.  Managed with jardiance  and gabapentin  as prescribed.  He does not check his sugars.  He checks his feet routinely.  His last eye exam was > 1 year ago.  Flu 09/2020.  Pneumovax never.  Prevnar never.  COVID x 2.  He does not follow with neurology or endocrinology.  ED/BPH: He reports dribbling.  He is taking flomax , finasteride  and sildenafil  as prescribed.  He does not follow with urology.  HLD status post stroke with PAD: His last LDL was 97, triglycerides 130, 07/2023.  He denies myalgias on atorvastatin .  He is taking eliquis  as well.  He follows with vascular.  Depression: Chronic, managed on duloxetine .  He is not currently seeing a therapist.  He denies anxiety, SI/HI.  Abdominal/thoracic aortic aneurysm: He gets yearly scans, s/p recent repair.  He follows with cardiology and vascular.  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 (HCC)    Aortic atherosclerosis (HCC)    Atrial fibrillation (HCC)    a.) CHA2DS2VASc = 7 (age, HFrEF, HTN, CVA x 2, vascular disease, T2DM) as of 11/28/2023; b.) rate/rhythm maintained on oral digoxin  + metoprolol  succinate; chronically anticoagulated with apixaban    Atypical mycobacterial infection of lung (HCC)    Bipolar disorder (HCC)    BPH (benign prostatic hyperplasia)    CAD (coronary artery disease)    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    Complication of anesthesia    a.) difficulty maintaining adequate sedation/anesthesia; premature emergence; b.) fentanyl  makes patient "mean, grumpy, and difficult"   COPD (chronic obstructive pulmonary disease) (HCC)    CVA (cerebral vascular accident) (HCC)    DDD (degenerative disc disease), cervical    a.) s/p ACDF C6-C7   Depression    Diverticulosis    Dyspnea    Erectile dysfunction    a.) on PDE5i (sildenafil ) PRN   GERD (gastroesophageal reflux disease)    Headache    Hepatitis C virus infection cured after antiviral drug therapy    a.) s/p completed Tx course of ledipasvir-sofosbuvir   HFrEF (heart failure with reduced ejection fraction) (HCC)    History of bilateral cataract extraction 03/2022   History of kidney stones 2016   History of TB (tuberculosis)    HTN (hypertension)    Hyperlipidemia    Infrarenal abdominal aortic  aneurysm (AAA) without rupture (HCC)    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     albuterol  (VENTOLIN  HFA) 108 (90 Base) MCG/ACT  inhaler INHALE 2 PUFFS INTO THE LUNGS EVERY 6 HOURS AS NEEDED FOR WHEEZING OR SHORTNESS OF BREATH. 1 each 2   apixaban  (ELIQUIS ) 5 MG TABS tablet TAKE 1 TABLET TWICE DAILY 180 tablet 0   aspirin  EC 81 MG tablet Take 1 tablet (81 mg total) by mouth daily at 6 (six) AM. Swallow whole. (Patient not taking: Reported on 12/30/2023) 90 tablet 12   atorvastatin  (LIPITOR) 80 MG tablet TAKE 1 TABLET EVERY DAY (Patient not taking: Reported on 12/30/2023) 90 tablet 1   Dexlansoprazole  30 MG capsule DR Take 1 capsule (30 mg total) by mouth in the morning and at bedtime. 180 capsule 0   digoxin  (LANOXIN ) 0.25 MG tablet TAKE 1 TABLET EVERY DAY 90 tablet 1   DULoxetine  (CYMBALTA ) 60 MG capsule TAKE 1 CAPSULE EVERY DAY 90 capsule 0   empagliflozin  (JARDIANCE ) 10 MG TABS tablet TAKE 1 TABLET EVERY DAY 90 tablet 0   famotidine  (PEPCID ) 40 MG tablet Take 1 tablet (40 mg total) by mouth daily. 90 tablet 0   finasteride  (PROSCAR ) 5 MG tablet Take 1 tablet (5 mg total) by mouth daily. 30 tablet 11   Fluticasone-Umeclidin-Vilant (TRELEGY ELLIPTA ) 200-62.5-25 MCG/ACT AEPB Inhale 1 Inhalation into the lungs daily in the afternoon.     gabapentin  (NEURONTIN ) 300 MG capsule TAKE 1 CAPSULE (300MG ) AT BEDTIME. IN ADDITION TO 600MG  FOR A TOTAL OF 900MG  AT BEDTIME 90 capsule 1   gabapentin  (NEURONTIN ) 600 MG tablet TAKE 1 TABLET THREE TIMES DAILY 270 tablet 3   hydrocortisone 1 % ointment Apply 1 Application topically as needed for itching.     metoprolol  succinate (TOPROL -XL) 25 MG 24 hr tablet TAKE 1 TABLET EVERY DAY 90 tablet 0   oxyCODONE -acetaminophen  (PERCOCET/ROXICET) 5-325 MG tablet Take 1-2 tablets by mouth every 4 (four) hours as needed for moderate pain (pain score 4-6). (Patient not taking: Reported on 12/30/2023) 30 tablet 0   sildenafil  (VIAGRA ) 50 MG tablet TAKE 1 TABLET EVERY DAY AS NEEDED FOR ERECTILE DYSFUNCTION 90 tablet 0   spironolactone  (ALDACTONE ) 25 MG tablet Take 25 mg by mouth as needed.     tamsulosin   (FLOMAX ) 0.4 MG CAPS capsule Take 1 capsule (0.4 mg total) by mouth daily. 30 capsule 11   TRELEGY ELLIPTA  100-62.5-25 MCG/ACT AEPB INHALE 1 PUFF INTO THE LUNGS DAILY 180 each 3   varenicline  (CHANTIX ) 1 MG tablet TAKE 1/2 TABLET DAILY FOR 3 DAYS, 1/2 TABLET TWICE DAILY FOR 4 DAYS, THEN TAKE 1 TABLET TWICE DAILY 53 tablet 11   No current facility-administered medications for this visit.    Allergies  Allergen Reactions   Chantix  [Varenicline ] Other (See Comments)    Depression    Family History  Problem Relation Age of Onset   Cancer Mother        liver  Heart disease Father    Diabetes Father    Breast cancer Sister    Brain cancer Sister     Social History   Socioeconomic History   Marital status: Married    Spouse name: Not on file   Number of children: Not on file   Years of education: Not on file   Highest education level: Some college, no degree  Occupational History   Not on file  Tobacco Use   Smoking status: Every Day    Current packs/day: 3.00    Average packs/day: 3.0 packs/day for 10.3 years (31.0 ttl pk-yrs)    Types: Cigarettes    Start date: 09/03/2013   Smokeless tobacco: Never   Tobacco comments:    Smoking 4 cigarettes daily 09/20/2023    Smoking 2 -3 daily - 11/26/23  Vaping Use   Vaping status: Former  Substance and Sexual Activity   Alcohol  use: Not Currently    Alcohol /week: 2.0 standard drinks of alcohol     Types: 2 Shots of liquor per week    Comment: occasional (every 3-4 weeks)    Drug use: No    Comment: polysubstance approx 2 years ago   Sexual activity: Not Currently  Other Topics Concern   Not on file  Social History Narrative   W[place] live out the country, lives with son, Star Corless, III.   Social Drivers of Corporate investment banker Strain: Low Risk  (01/06/2024)   Overall Financial Resource Strain (CARDIA)    Difficulty of Paying Living Expenses: Not very hard  Food Insecurity: No Food Insecurity (01/06/2024)   Hunger  Vital Sign    Worried About Running Out of Food in the Last Year: Never true    Ran Out of Food in the Last Year: Never true  Transportation Needs: No Transportation Needs (01/06/2024)   PRAPARE - Administrator, Civil Service (Medical): No    Lack of Transportation (Non-Medical): No  Physical Activity: Inactive (01/06/2024)   Exercise Vital Sign    Days of Exercise per Week: 0 days    Minutes of Exercise per Session: 0 min  Stress: No Stress Concern Present (01/06/2024)   [employer] of Occupational Health - Occupational Stress Questionnaire    Feeling of Stress : Not at all  Social Connections: Moderately Isolated (01/06/2024)   Social Connection and Isolation Panel [NHANES]    Frequency of Communication with Friends and Family: More than three times a week    Frequency of Social Gatherings with Friends and Family: Once a week    Attends Religious Services: Never    Database administrator or Organizations: No    Attends Banker Meetings: Never    Marital Status: Married  Catering manager Violence: Not At Risk (02/28/2023)   Humiliation, Afraid, Rape, and Kick questionnaire    Fear of Current or Ex-Partner: No    Emotionally Abused: No    Physically Abused: No    Sexually Abused: No     Constitutional: Patient reports chronic fatigue.  Denies fever, malaise, headache or abrupt weight changes.  HEENT: Denies eye pain, eye redness, ear pain, ringing in the ears, wax buildup, runny nose, nasal congestion, bloody nose, or sore throat. Respiratory: Patient reports chronic cough and shortness of breath.  Denies difficulty breathing, or sputum production.   Cardiovascular: Denies chest pain, chest tightness, palpitations or swelling in the hands or feet.  Gastrointestinal: Patient reports frequent nausea, reflux.  Denies abdominal pain, bloating, constipation,  diarrhea or blood in the stool.  GU: Patient reports urinary dribbling.  Denies urgency, frequency, pain  with urination, burning sensation, blood in urine, odor or discharge. Musculoskeletal: Patient reports difficulty with gait.  Denies decrease in range of motion, muscle pain, joint pain or swelling.  Skin: Denies redness, rashes, lesions or ulcercations.  Neurological: Patient reports neuropathic pain.  Denies dizziness, difficulty with memory, difficulty with speech or problems with balance and coordination.  Psych: Patient has a history of depression.  Denies anxiety, SI/HI.  No other specific complaints in a complete review of systems (except as listed in HPI above).  Objective:   Physical Exam BP 98/64 (BP Location: Left Arm, Patient Position: Sitting, Cuff Size: Normal)   Ht 6\' 2"  (1.88 m)   Wt 161 lb 6.4 oz (73.2 kg)   BMI 20.72 kg/m    Wt Readings from Last 3 Encounters:  12/30/23 164 lb 6.4 oz (74.6 kg)  12/18/23 171 lb 15.3 oz (78 kg)  11/28/23 167 lb 6.4 oz (75.9 kg)    General: Appears his stated age, chronically ill-appearing, in NAD. Skin: Dry and intact.  Discoloration noted of BLE.  No ulcerations noted. HEENT: Head: normal shape and size; Eyes: sclera white, no icterus, conjunctiva pink, PERRLA and EOMs intact;  Cardiovascular: Normal rate and rhythm. S1,S2 noted.  No murmur, rubs or gallops noted. No JVD or BLE edema. No carotid bruits noted.   Pulmonary/Chest: Normal effort and diminished breath sounds. No respiratory distress. No wheezes, rales or ronchi noted.  Abdomen: Soft and tender in the epigastric region. Normal bowel sounds.  Musculoskeletal: Gait slow and steady with use of cane. Neurological: Alert and oriented. Coordination normal.  Psychiatric: Mood and affect normal. Behavior is normal. Judgment and thought content normal.    BMET    Component Value Date/Time   NA 136 12/19/2023 0407   NA 137 11/14/2020 1409   K 4.4 12/19/2023 0407   CL 106 12/19/2023 0407   CO2 26 12/19/2023 0407   GLUCOSE 119 (H) 12/19/2023 0407   BUN 20 12/19/2023 0407    BUN 15 11/14/2020 1409   CREATININE 0.76 12/19/2023 0407   CREATININE 0.82 07/09/2023 1341   CALCIUM  8.4 (L) 12/19/2023 0407   GFRNONAA >60 12/19/2023 0407   GFRNONAA 94 12/18/2017 1137   GFRAA 109 12/18/2017 1137    Lipid Panel     Component Value Date/Time   CHOL 155 07/09/2023 1341   CHOL 217 (H) 12/23/2015 1606   TRIG 127 07/09/2023 1341   HDL 36 (L) 07/09/2023 1341   HDL 29 (L) 12/23/2015 1606   CHOLHDL 4.3 07/09/2023 1341   LDLCALC 97 07/09/2023 1341    CBC    Component Value Date/Time   WBC 10.3 12/19/2023 0407   RBC 4.14 (L) 12/19/2023 0407   HGB 12.7 (L) 12/19/2023 0407   HGB 12.9 (L) 11/14/2020 1409   HCT 39.4 12/19/2023 0407   HCT 40.0 11/14/2020 1409   PLT 160 12/19/2023 0407   PLT 287 11/14/2020 1409   MCV 95.2 12/19/2023 0407   MCV 95 11/14/2020 1409   MCH 30.7 12/19/2023 0407   MCHC 32.2 12/19/2023 0407   RDW 15.3 12/19/2023 0407   RDW 14.4 11/14/2020 1409   LYMPHSABS 0.9 11/14/2020 1409   MONOABS 0.4 06/13/2017 1502   EOSABS 0.2 11/14/2020 1409   BASOSABS 0.0 11/14/2020 1409    Hgb A1C Lab Results  Component Value Date   HGBA1C 6.3 (H) 07/09/2023  Assessment & Plan:     RTC in 6 months for your annual exam Helayne Lo, NP

## 2024-01-07 NOTE — Assessment & Plan Note (Signed)
 Continue dexlansoprazole , famotidine  Referral to [facility] for second opinion

## 2024-01-07 NOTE — Assessment & Plan Note (Signed)
 Encourage smoking cessation Continue trelegy and albuterol  as prescribed

## 2024-01-07 NOTE — Assessment & Plan Note (Signed)
 Compensated Continue metoprolol  Refilled his spironolactone  as he should be taking this per cardiology Reinforced DASH diet Monitor daily weights He will continue to follow with cardiology

## 2024-01-08 ENCOUNTER — Encounter: Payer: Self-pay | Admitting: Internal Medicine

## 2024-01-08 LAB — LIPID PANEL
Cholesterol: 157 mg/dL (ref <200)
HDL: 36 mg/dL — ABNORMAL LOW (ref >=40)
LDL Cholesterol (Calc): 98 mg/dL
Non-HDL Cholesterol (Calc): 121 mg/dL (ref <130)
Total CHOL/HDL Ratio: 4.4 (calc) (ref <5.0)
Triglycerides: 126 mg/dL (ref <150)

## 2024-01-08 LAB — HEMOGLOBIN A1C
Hgb A1c MFr Bld: 6.8 % — ABNORMAL HIGH (ref <5.7)
Mean Plasma Glucose: 148 mg/dL
eAG (mmol/L): 8.2 mmol/L

## 2024-01-14 NOTE — Progress Notes (Unsigned)
 MRN : [id number]  Ricardo Cervantes. is a 68 y.o. ([birth year]) male who presents with chief complaint of check circulation.  History of Present Illness:   The patient is seen for follow up evaluation of TAA/AAA status post CTA. There were no problems or complications related to the CT scan.   The patient denies interval development of abdominal or back pain. No new lower extremity pain or discoloration of the toes.  Actually feels that his walking is slightly improved but as per his preop status he is still very limited in his walking distance.  The patient denies interval anaurosis fugax. There is no recent history of TIA symptoms or focal motor deficits.   The patient denies PAD or claudication symptoms.   The patient denies recent episodes of angina or shortness of breath.  CT angiography of the abdomen and pelvis is reviewed by me and shows an infrarenal AAA 5.2 cm.  Thoracic aortic aneurysm stent is widely patent without endoleak.  No outpatient medications have been marked as taking for the 01/16/24 encounter (Appointment) with Prescilla Brod, Ninette Basque, MD.    Past Medical History:  Diagnosis Date   Aneurysm of descending thoracic aorta without rupture (HCC)    Aortic atherosclerosis (HCC)    Atrial fibrillation (HCC)    a.) CHA2DS2VASc = 7 (age, HFrEF, HTN, CVA x 2, vascular disease, T2DM) as of 11/28/2023; b.) rate/rhythm maintained on oral digoxin  + metoprolol  succinate; chronically anticoagulated with apixaban    Atypical mycobacterial infection of lung (HCC)    Bipolar disorder (HCC)    BPH (benign prostatic hyperplasia)    CAD (coronary artery disease)    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    Complication of anesthesia    a.) difficulty maintaining adequate sedation/anesthesia; premature emergence; b.) fentanyl  makes patient "mean, grumpy, and difficult"   COPD (chronic obstructive pulmonary  disease) (HCC)    CVA (cerebral vascular accident) (HCC)    DDD (degenerative disc disease), cervical    a.) s/p ACDF C6-C7   Depression    Diverticulosis    Dyspnea    Erectile dysfunction    a.) on PDE5i (sildenafil ) PRN   GERD (gastroesophageal reflux disease)    Headache    Hepatitis C virus infection cured after antiviral drug therapy    a.) s/p completed Tx course of ledipasvir-sofosbuvir   HFrEF (heart failure with reduced ejection fraction) (HCC)    History of bilateral cataract extraction 03/2022   History of kidney stones 2016   History of TB (tuberculosis)    HTN (hypertension)    Hyperlipidemia    Infrarenal abdominal aortic aneurysm (AAA) without rupture (HCC)    Myocardial bridge    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   On apixaban  therapy    Pericarditis    Peripheral vascular disease (HCC)    Pneumonia    Protein calorie malnutrition (HCC)    RA (rheumatoid arthritis) (HCC)    Smoker    T2DM (type 2 diabetes mellitus) (HCC)    Thoracic scoliosis     Past Surgical History:  Procedure Laterality Date   ABDOMINAL AORTOGRAM W/LOWER EXTREMITY N/A 01/15/2017   Procedure: Abdominal Aortogram w/Lower Extremity;  Surgeon: Jackquelyn Mass, MD;  Location: [facility];  Service: Cardiovascular;  Laterality: N/A;   ANTERIOR CERVICAL DECOMP/DISCECTOMY FUSION N/A 2004   FEMORAL-POPLITEAL BYPASS GRAFT Right 06/19/2017   Procedure: BYPASS GRAFT FEMORAL-POPLITEAL ARTERY;  Surgeon: Jackquelyn Mass, MD;  Location: [facility];  Service: Vascular;  Laterality: Right;   FEMORAL-POPLITEAL BYPASS GRAFT     had 2 on right leg , 1 on the left   fractured nose repair     as a child   liver abcess removed      LOWER EXTREMITY ANGIOGRAPHY Right 01/15/2017   Procedure: Lower Extremity Angiography;  Surgeon: Jackquelyn Mass, MD;  Location: [facility];  Service: Cardiovascular;  Laterality: Right;   LOWER EXTREMITY ANGIOGRAPHY Right 02/13/2017    Procedure: Lower Extremity Angiography;  Surgeon: Jackquelyn Mass, MD;  Location: [facility];  Service: Cardiovascular;  Laterality: Right;   PLACEMENT OF LUMBAR DRAIN N/A 12/18/2023   Procedure: PLACEMENT OF LUMBAR DRAIN;  Surgeon: Carroll Clamp, MD;  Location: [facility];  Service: Neurosurgery;  Laterality: N/A;   THORACIC AORTIC ENDOVASCULAR STENT GRAFT N/A 12/18/2023   Procedure: THORACIC AORTIC ENDOVASCULAR STENT GRAFT;  Surgeon: Jackquelyn Mass, MD;  Location: [facility];  Service: Cardiovascular;  Laterality: N/A;   VASECTOMY N/A     Social History Social History   Tobacco Use   Smoking status: Every Day    Current packs/day: 3.00    Average packs/day: 3.0 packs/day for 10.4 years (31.1 ttl pk-yrs)    Types: Cigarettes    Start date: 09/03/2013   Smokeless tobacco: Never   Tobacco comments:    Smoking 4 cigarettes daily 09/20/2023    Smoking 2 -3 daily - 11/26/23  Vaping Use   Vaping status: Former  Substance Use Topics   Alcohol  use: Not Currently    Alcohol /week: 2.0 standard drinks of alcohol     Types: 2 Shots of liquor per week    Comment: occasional (every 3-4 weeks)    Drug use: No    Comment: polysubstance approx 2 years ago    Family History Family History  Problem Relation Age of Onset   Cancer Mother        liver   Heart disease Father    Diabetes Father    Breast cancer Sister    Brain cancer Sister     Allergies  Allergen Reactions   Chantix  [Varenicline ] Other (See Comments)    Depression     REVIEW OF SYSTEMS (Negative unless checked)  Constitutional: [] Weight loss  [] Fever  [] Chills Cardiac: [] Chest pain   [] Chest pressure   [] Palpitations   [] Shortness of breath when laying flat   [] Shortness of breath with exertion. Vascular:  [x] Pain in legs with walking   [] Pain in legs at rest  [] History of DVT   [] Phlebitis   [] Swelling in legs   [] Varicose veins   [] Non-healing ulcers Pulmonary:   [] Uses home  oxygen   [] Productive cough   [] Hemoptysis   [] Wheeze  [] COPD   [] Asthma Neurologic:  [] Dizziness   [] Seizures   [] History of stroke   [] History of TIA  [] Aphasia   [] Vissual changes   [] Weakness or numbness in arm   [] Weakness or numbness in leg Musculoskeletal:   [] Joint swelling   [] Joint pain   [] Low back pain Hematologic:  [] Easy  bruising  [] Easy bleeding   [] Hypercoagulable state   [] Anemic Gastrointestinal:  [] Diarrhea   [] Vomiting  [] Gastroesophageal reflux/heartburn   [] Difficulty swallowing. Genitourinary:  [] Chronic kidney disease   [] Difficult urination  [] Frequent urination   [] Blood in urine Skin:  [] Rashes   [] Ulcers  Psychological:  [] History of anxiety   []  History of major depression.  Physical Examination  There were no vitals filed for this visit. There is no height or weight on file to calculate BMI. Gen: WD/WN, NAD Head: [place]/AT, No temporalis wasting.  Ear/Nose/Throat: Hearing grossly intact, nares w/o erythema or drainage Eyes: PER, EOMI, sclera nonicteric.  Neck: Supple, no masses.  No bruit or JVD.  Pulmonary:  Good air movement, no audible wheezing, no use of accessory muscles.  Cardiac: RRR, normal S1, S2, no Murmurs. Vascular:  mild trophic changes, no open wounds Vessel Right Left  Radial Palpable Palpable  PT Not Palpable Not Palpable  DP Not Palpable Not Palpable  Gastrointestinal: soft, non-distended. No guarding/no peritoneal signs.  Musculoskeletal: M/S 5/5 throughout.  No visible deformity.  Neurologic: CN 2-12 intact. Pain and light touch intact in extremities.  Symmetrical.  Speech is fluent. Motor exam as listed above. Psychiatric: Judgment intact, Mood & affect appropriate for pt's clinical situation. Dermatologic: No rashes or ulcers noted.  No changes consistent with cellulitis.   CBC Lab Results  Component Value Date   WBC 10.3 12/19/2023   HGB 12.7 (L) 12/19/2023   HCT 39.4 12/19/2023   MCV 95.2 12/19/2023   PLT 160 12/19/2023     BMET    Component Value Date/Time   NA 136 12/19/2023 0407   NA 137 11/14/2020 1409   K 4.4 12/19/2023 0407   CL 106 12/19/2023 0407   CO2 26 12/19/2023 0407   GLUCOSE 119 (H) 12/19/2023 0407   BUN 20 12/19/2023 0407   BUN 15 11/14/2020 1409   CREATININE 0.76 12/19/2023 0407   CREATININE 0.82 07/09/2023 1341   CALCIUM  8.4 (L) 12/19/2023 0407   GFRNONAA >60 12/19/2023 0407   GFRNONAA 94 12/18/2017 1137   GFRAA 109 12/18/2017 1137   CrCl cannot be calculated (Patient's most recent lab result is older than the maximum 21 days allowed.).  COAG Lab Results  Component Value Date   INR 1.2 12/18/2023   INR 1.10 06/13/2017    Radiology CT ANGIO CHEST/ABD/PELVIS POST EVAR W & OR WO CONTRAST Result Date: 01/07/2024 CLINICAL DATA:  Chest pain and increasing leg pain EXAM: CT ANGIOGRAPHY CHEST, ABDOMEN AND PELVIS TECHNIQUE: Non-contrast CT of the chest was initially obtained. Multidetector CT imaging through the chest, abdomen and pelvis was performed using the standard protocol during bolus administration of intravenous contrast. Multiplanar reconstructed images and MIPs were obtained and reviewed to evaluate the vascular anatomy. RADIATION DOSE REDUCTION: This exam was performed according to the departmental dose-optimization program which includes automated exposure control, adjustment of the mA and/or kV according to patient size and/or use of iterative reconstruction technique. CONTRAST:  OMNIPAQUE  IOHEXOL  350 MG/ML SOLN COMPARISON:  CT angiogram of the chest abdomen pelvis performed October 15, 2023 FINDINGS: CTA CHEST FINDINGS Cardiovascular: Motion related changes limit evaluation of the coronary territories, however, calcific atherosclerosis is present within the left anterior descending coronary artery. Sinus of Valsalva is estimated approximately 35 mm x 38 mm x 42 mm. The tubular ascending thoracic aorta is estimated at 4.2 x 3.9 cm. The aortic arch is notable for irregular  atherosclerotic changes a measures approximately 35 mm x 33 mm. There  is been interval placement of a descending thoracic aortic stent graft which is placed distal to the left subclavian origin. The stent graft is patent. There is no evidence of endoleak. The descending thoracic aorta measures approximately 6.0 x 5.3 cm at the site of largest aneurysm diameter in the proximal descending segment. The distal descending aneurysm is estimated at 4.8 x 4.7 cm. Mediastinum/Nodes: No mediastinal lymphadenopathy. Lungs/Pleura: Similar appearance of left apical and posterior cavitary lesion with adjacent peripheral parenchymal nodule best seen on image 39 of series 7. Underlying changes of emphysema. Musculoskeletal: Degenerative changes are present in the imaged osseous structures. Review of the MIP images confirms the above findings. CTA ABDOMEN AND PELVIS FINDINGS VASCULAR Aorta: A bilobed infrarenal abdominal aortic aneurysm is present which is estimated at 5.1 x 4.9 cm. Celiac: Patent. The left gastric artery originates directly from the abdominal aorta. SMA: Patent. Renals: Patent. IMA: Patent. Inflow: The right common iliac artery is ectatic and measures 1.5 cm in the distal segment. A moderate to severe stenosis is present in the proximal right internal iliac artery. Atherosclerotic changes are present in the right external iliac artery which are mild. The right common femoral artery demonstrates presumed postsurgical changes and is dilated estimated at 1.7 cm. The left common iliac artery is ectatic and demonstrates mild diffuse atherosclerotic changes. The distal segment is estimated at 1.6 cm in diameter. A moderate to severe stenosis is present in the proximal left internal iliac artery. The left external iliac artery is patent with mild diffuse atherosclerotic changes. The left common femoral artery is grossly patent. Veins: No obvious venous abnormality within the limitations of this arterial phase study.  Review of the MIP images confirms the above findings. NON-VASCULAR Hepatobiliary: Nothing significant.  Suspected gallstones. Pancreas: Nothing significant Spleen: Normal in size without focal abnormality. Adrenals/Urinary Tract: No hydronephrosis or significant nephrolithiasis. Stomach/Bowel: No dilated loops of bowel are seen. Lymphatic: No significant lymphadenopathy. Reproductive: Enlarged prostate. Other: Nothing significant. Musculoskeletal: Degenerative changes in the imaged osseous structures which is worse at L5-S1. Review of the MIP images confirms the above findings. IMPRESSION: 1. Post treatment changes from placement of a descending thoracic endograft without evidence of complication. The descending aneurysm is estimated at 6.0 x 5.3 cm. 2. Infrarenal abdominal aortic aneurysm estimated at 5.1 x 4.9 cm. 3. Cavitary lesion in the left upper lobe which is unchanged. Additional pulmonary findings are unchanged when compared to CT performed December 10, 2023. Electronically Signed   By: Reagan Camera M.D.   On: 01/07/2024 09:38   PERIPHERAL VASCULAR CATHETERIZATION Result Date: 12/18/2023 See surgical note for result.    Assessment/Plan 1. Thoracic aortic aneurysm without rupture, unspecified part (HCC) (Primary) Recommend:  Patient is now status post successful thoracic stent graft.  Follow-up CT scan demonstrates an excellent result without endoleak.  I have also discussed optimizing medical management with hypertension and lipid control and the negative effect that any tobacco products have on aneurysmal disease.  The patient is also encouraged to exercise a minimum of 30 minutes 4 times a week.   Should the patient develop new onset chest or back pain or signs of peripheral embolization they are instructed to seek medical attention immediately and to alert the physician providing care that they have an aneurysm in the chest.   The patient voices their understanding.  The patient will  return as ordered with a CT scan of the chest  2. Infrarenal abdominal aortic aneurysm (AAA) without rupture (HCC) Recommend:  The AAA is >  5 cm and therefore should undergo repair. Patient is status post CT scan of the abdominal aorta. Base on evaluation of the CT scan by me the patient is a candidate for endovascular repair.   The patient will require cardiac clearance prior to stent graft placement.   The patient will continue antiplatelet therapy as prescribed (since the patient is undergoing endovascular repair as opposed to open repair) as well as aggressive management of hyperlipidemia. Exercise is encouraged.   The patient is reminded that lifetime routine surveillance is a necessity with an endograft.   The risks and benefits of AAA repair are reviewed with the patient.  All questions are answered.  Alternative therapies are also discussed.  The patient agrees to proceed with endovascular aneurysm repair to prevent lethal rupture.  Patient will follow-up with me in the office after the surgery.  3. Atherosclerosis of native artery of both lower extremities with intermittent claudication (HCC)  Recommend:  The patient has evidence of atherosclerosis of the lower extremities with claudication.  The patient does not voice lifestyle limiting changes at this point in time.  Noninvasive studies do not suggest clinically significant change.  No invasive studies, angiography or surgery at this time The patient should continue walking and begin a more formal exercise program.  The patient should continue antiplatelet therapy and aggressive treatment of the lipid abnormalities  No changes in the patient's medications at this time  Continued surveillance is indicated as atherosclerosis is likely to progress with time.    The patient will continue follow up with noninvasive studies as ordered.   4. Chronic atrial fibrillation (HCC) Continue antiarrhythmia medications as already  ordered, these medications have been reviewed and there are no changes at this time.  Continue anticoagulation as ordered by Cardiology Service  5. DM (diabetes mellitus), type 2 with complications (HCC) Continue hypoglycemic medications as already ordered, these medications have been reviewed and there are no changes at this time.  Hgb A1C to be monitored as already arranged by primary service    [name], MD  01/14/2024 8:28 PM

## 2024-01-14 NOTE — H&P (View-Only) (Signed)
 MRN : [id number]  Ricardo Cervantes. is a 68 y.o. (1956/03/08) male who presents with chief complaint of check circulation.  History of Present Illness:   The patient is seen for follow up evaluation of TAA/AAA status post CTA. There were no problems or complications related to the CT scan.   The patient denies interval development of abdominal or back pain. No new lower extremity pain or discoloration of the toes.  Actually feels that his walking is slightly improved but as per his preop status he is still very limited in his walking distance.  The patient denies interval anaurosis fugax. There is no recent history of TIA symptoms or focal motor deficits.   The patient denies PAD or claudication symptoms.   The patient denies recent episodes of angina or shortness of breath.  CT angiography of the abdomen and pelvis is reviewed by me and shows an infrarenal AAA 5.2 cm.  Thoracic aortic aneurysm stent is widely patent without endoleak.  No outpatient medications have been marked as taking for the 01/16/24 encounter (Appointment) with Prescilla Brod, Ninette Basque, MD.    Past Medical History:  Diagnosis Date   Aneurysm of descending thoracic aorta without rupture (HCC)    Aortic atherosclerosis (HCC)    Atrial fibrillation (HCC)    a.) CHA2DS2VASc = 7 (age, HFrEF, HTN, CVA x 2, vascular disease, T2DM) as of 11/28/2023; b.) rate/rhythm maintained on oral digoxin  + metoprolol  succinate; chronically anticoagulated with apixaban    Atypical mycobacterial infection of lung (HCC)    Bipolar disorder (HCC)    BPH (benign prostatic hyperplasia)    CAD (coronary artery disease)    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    Complication of anesthesia    a.) difficulty maintaining adequate sedation/anesthesia; premature emergence; b.) fentanyl  makes patient mean, grumpy, and difficult   COPD (chronic obstructive pulmonary  disease) (HCC)    CVA (cerebral vascular accident) (HCC)    DDD (degenerative disc disease), cervical    a.) s/p ACDF C6-C7   Depression    Diverticulosis    Dyspnea    Erectile dysfunction    a.) on PDE5i (sildenafil ) PRN   GERD (gastroesophageal reflux disease)    Headache    Hepatitis C virus infection cured after antiviral drug therapy    a.) s/p completed Tx course of ledipasvir-sofosbuvir   HFrEF (heart failure with reduced ejection fraction) (HCC)    History of bilateral cataract extraction 03/2022   History of kidney stones 2016   History of TB (tuberculosis)    HTN (hypertension)    Hyperlipidemia    Infrarenal abdominal aortic aneurysm (AAA) without rupture (HCC)    Myocardial bridge    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   On apixaban  therapy    Pericarditis    Peripheral vascular disease (HCC)    Pneumonia    Protein calorie malnutrition (HCC)    RA (rheumatoid arthritis) (HCC)    Smoker    T2DM (type 2 diabetes mellitus) (HCC)    Thoracic scoliosis     Past Surgical History:  Procedure Laterality Date   ABDOMINAL AORTOGRAM W/LOWER EXTREMITY N/A 01/15/2017   Procedure: Abdominal Aortogram w/Lower Extremity;  Surgeon: Jackquelyn Mass, MD;  Location: [facility];  Service: Cardiovascular;  Laterality: N/A;   ANTERIOR CERVICAL DECOMP/DISCECTOMY FUSION N/A 2004   FEMORAL-POPLITEAL BYPASS GRAFT Right 06/19/2017   Procedure: BYPASS GRAFT FEMORAL-POPLITEAL ARTERY;  Surgeon: Jackquelyn Mass, MD;  Location: [facility];  Service: Vascular;  Laterality: Right;   FEMORAL-POPLITEAL BYPASS GRAFT     had 2 on right leg , 1 on the left   fractured nose repair     as a child   liver abcess removed      LOWER EXTREMITY ANGIOGRAPHY Right 01/15/2017   Procedure: Lower Extremity Angiography;  Surgeon: Jackquelyn Mass, MD;  Location: [facility];  Service: Cardiovascular;  Laterality: Right;   LOWER EXTREMITY ANGIOGRAPHY Right 02/13/2017    Procedure: Lower Extremity Angiography;  Surgeon: Jackquelyn Mass, MD;  Location: [facility];  Service: Cardiovascular;  Laterality: Right;   PLACEMENT OF LUMBAR DRAIN N/A 12/18/2023   Procedure: PLACEMENT OF LUMBAR DRAIN;  Surgeon: Carroll Clamp, MD;  Location: [facility];  Service: Neurosurgery;  Laterality: N/A;   THORACIC AORTIC ENDOVASCULAR STENT GRAFT N/A 12/18/2023   Procedure: THORACIC AORTIC ENDOVASCULAR STENT GRAFT;  Surgeon: Jackquelyn Mass, MD;  Location: [facility];  Service: Cardiovascular;  Laterality: N/A;   VASECTOMY N/A     Social History Social History   Tobacco Use   Smoking status: Every Day    Current packs/day: 3.00    Average packs/day: 3.0 packs/day for 10.4 years (31.1 ttl pk-yrs)    Types: Cigarettes    Start date: 09/03/2013   Smokeless tobacco: Never   Tobacco comments:    Smoking 4 cigarettes daily 09/20/2023    Smoking 2 -3 daily - 11/26/23  Vaping Use   Vaping status: Former  Substance Use Topics   Alcohol  use: Not Currently    Alcohol /week: 2.0 standard drinks of alcohol     Types: 2 Shots of liquor per week    Comment: occasional (every 3-4 weeks)    Drug use: No    Comment: polysubstance approx 2 years ago    Family History Family History  Problem Relation Age of Onset   Cancer Mother        liver   Heart disease Father    Diabetes Father    Breast cancer Sister    Brain cancer Sister     Allergies  Allergen Reactions   Chantix  [Varenicline ] Other (See Comments)    Depression     REVIEW OF SYSTEMS (Negative unless checked)  Constitutional: [] Weight loss  [] Fever  [] Chills Cardiac: [] Chest pain   [] Chest pressure   [] Palpitations   [] Shortness of breath when laying flat   [] Shortness of breath with exertion. Vascular:  [x] Pain in legs with walking   [] Pain in legs at rest  [] History of DVT   [] Phlebitis   [] Swelling in legs   [] Varicose veins   [] Non-healing ulcers Pulmonary:   [] Uses home  oxygen   [] Productive cough   [] Hemoptysis   [] Wheeze  [] COPD   [] Asthma Neurologic:  [] Dizziness   [] Seizures   [] History of stroke   [] History of TIA  [] Aphasia   [] Vissual changes   [] Weakness or numbness in arm   [] Weakness or numbness in leg Musculoskeletal:   [] Joint swelling   [] Joint pain   [] Low back pain Hematologic:  [] Easy  bruising  [] Easy bleeding   [] Hypercoagulable state   [] Anemic Gastrointestinal:  [] Diarrhea   [] Vomiting  [] Gastroesophageal reflux/heartburn   [] Difficulty swallowing. Genitourinary:  [] Chronic kidney disease   [] Difficult urination  [] Frequent urination   [] Blood in urine Skin:  [] Rashes   [] Ulcers  Psychological:  [] History of anxiety   []  History of major depression.  Physical Examination  There were no vitals filed for this visit. There is no height or weight on file to calculate BMI. Gen: WD/WN, NAD Head: [place]/AT, No temporalis wasting.  Ear/Nose/Throat: Hearing grossly intact, nares w/o erythema or drainage Eyes: PER, EOMI, sclera nonicteric.  Neck: Supple, no masses.  No bruit or JVD.  Pulmonary:  Good air movement, no audible wheezing, no use of accessory muscles.  Cardiac: RRR, normal S1, S2, no Murmurs. Vascular:  mild trophic changes, no open wounds Vessel Right Left  Radial Palpable Palpable  PT Not Palpable Not Palpable  DP Not Palpable Not Palpable  Gastrointestinal: soft, non-distended. No guarding/no peritoneal signs.  Musculoskeletal: M/S 5/5 throughout.  No visible deformity.  Neurologic: CN 2-12 intact. Pain and light touch intact in extremities.  Symmetrical.  Speech is fluent. Motor exam as listed above. Psychiatric: Judgment intact, Mood & affect appropriate for pt's clinical situation. Dermatologic: No rashes or ulcers noted.  No changes consistent with cellulitis.   CBC Lab Results  Component Value Date   WBC 10.3 12/19/2023   HGB 12.7 (L) 12/19/2023   HCT 39.4 12/19/2023   MCV 95.2 12/19/2023   PLT 160 12/19/2023     BMET    Component Value Date/Time   NA 136 12/19/2023 0407   NA 137 11/14/2020 1409   K 4.4 12/19/2023 0407   CL 106 12/19/2023 0407   CO2 26 12/19/2023 0407   GLUCOSE 119 (H) 12/19/2023 0407   BUN 20 12/19/2023 0407   BUN 15 11/14/2020 1409   CREATININE 0.76 12/19/2023 0407   CREATININE 0.82 07/09/2023 1341   CALCIUM  8.4 (L) 12/19/2023 0407   GFRNONAA >60 12/19/2023 0407   GFRNONAA 94 12/18/2017 1137   GFRAA 109 12/18/2017 1137   CrCl cannot be calculated (Patient's most recent lab result is older than the maximum 21 days allowed.).  COAG Lab Results  Component Value Date   INR 1.2 12/18/2023   INR 1.10 06/13/2017    Radiology CT ANGIO CHEST/ABD/PELVIS POST EVAR W & OR WO CONTRAST Result Date: 01/07/2024 CLINICAL DATA:  Chest pain and increasing leg pain EXAM: CT ANGIOGRAPHY CHEST, ABDOMEN AND PELVIS TECHNIQUE: Non-contrast CT of the chest was initially obtained. Multidetector CT imaging through the chest, abdomen and pelvis was performed using the standard protocol during bolus administration of intravenous contrast. Multiplanar reconstructed images and MIPs were obtained and reviewed to evaluate the vascular anatomy. RADIATION DOSE REDUCTION: This exam was performed according to the departmental dose-optimization program which includes automated exposure control, adjustment of the mA and/or kV according to patient size and/or use of iterative reconstruction technique. CONTRAST:  OMNIPAQUE  IOHEXOL  350 MG/ML SOLN COMPARISON:  CT angiogram of the chest abdomen pelvis performed October 15, 2023 FINDINGS: CTA CHEST FINDINGS Cardiovascular: Motion related changes limit evaluation of the coronary territories, however, calcific atherosclerosis is present within the left anterior descending coronary artery. Sinus of Valsalva is estimated approximately 35 mm x 38 mm x 42 mm. The tubular ascending thoracic aorta is estimated at 4.2 x 3.9 cm. The aortic arch is notable for irregular  atherosclerotic changes a measures approximately 35 mm x 33 mm. There  is been interval placement of a descending thoracic aortic stent graft which is placed distal to the left subclavian origin. The stent graft is patent. There is no evidence of endoleak. The descending thoracic aorta measures approximately 6.0 x 5.3 cm at the site of largest aneurysm diameter in the proximal descending segment. The distal descending aneurysm is estimated at 4.8 x 4.7 cm. Mediastinum/Nodes: No mediastinal lymphadenopathy. Lungs/Pleura: Similar appearance of left apical and posterior cavitary lesion with adjacent peripheral parenchymal nodule best seen on image 39 of series 7. Underlying changes of emphysema. Musculoskeletal: Degenerative changes are present in the imaged osseous structures. Review of the MIP images confirms the above findings. CTA ABDOMEN AND PELVIS FINDINGS VASCULAR Aorta: A bilobed infrarenal abdominal aortic aneurysm is present which is estimated at 5.1 x 4.9 cm. Celiac: Patent. The left gastric artery originates directly from the abdominal aorta. SMA: Patent. Renals: Patent. IMA: Patent. Inflow: The right common iliac artery is ectatic and measures 1.5 cm in the distal segment. A moderate to severe stenosis is present in the proximal right internal iliac artery. Atherosclerotic changes are present in the right external iliac artery which are mild. The right common femoral artery demonstrates presumed postsurgical changes and is dilated estimated at 1.7 cm. The left common iliac artery is ectatic and demonstrates mild diffuse atherosclerotic changes. The distal segment is estimated at 1.6 cm in diameter. A moderate to severe stenosis is present in the proximal left internal iliac artery. The left external iliac artery is patent with mild diffuse atherosclerotic changes. The left common femoral artery is grossly patent. Veins: No obvious venous abnormality within the limitations of this arterial phase study.  Review of the MIP images confirms the above findings. NON-VASCULAR Hepatobiliary: Nothing significant.  Suspected gallstones. Pancreas: Nothing significant Spleen: Normal in size without focal abnormality. Adrenals/Urinary Tract: No hydronephrosis or significant nephrolithiasis. Stomach/Bowel: No dilated loops of bowel are seen. Lymphatic: No significant lymphadenopathy. Reproductive: Enlarged prostate. Other: Nothing significant. Musculoskeletal: Degenerative changes in the imaged osseous structures which is worse at L5-S1. Review of the MIP images confirms the above findings. IMPRESSION: 1. Post treatment changes from placement of a descending thoracic endograft without evidence of complication. The descending aneurysm is estimated at 6.0 x 5.3 cm. 2. Infrarenal abdominal aortic aneurysm estimated at 5.1 x 4.9 cm. 3. Cavitary lesion in the left upper lobe which is unchanged. Additional pulmonary findings are unchanged when compared to CT performed December 10, 2023. Electronically Signed   By: Reagan Camera M.D.   On: 01/07/2024 09:38   PERIPHERAL VASCULAR CATHETERIZATION Result Date: 12/18/2023 See surgical note for result.    Assessment/Plan 1. Thoracic aortic aneurysm without rupture, unspecified part (HCC) (Primary) Recommend:  Patient is now status post successful thoracic stent graft.  Follow-up CT scan demonstrates an excellent result without endoleak.  I have also discussed optimizing medical management with hypertension and lipid control and the negative effect that any tobacco products have on aneurysmal disease.  The patient is also encouraged to exercise a minimum of 30 minutes 4 times a week.   Should the patient develop new onset chest or back pain or signs of peripheral embolization they are instructed to seek medical attention immediately and to alert the physician providing care that they have an aneurysm in the chest.   The patient voices their understanding.  The patient will  return as ordered with a CT scan of the chest  2. Infrarenal abdominal aortic aneurysm (AAA) without rupture (HCC) Recommend:  The AAA is >  5 cm and therefore should undergo repair. Patient is status post CT scan of the abdominal aorta. Base on evaluation of the CT scan by me the patient is a candidate for endovascular repair.   The patient will require cardiac clearance prior to stent graft placement.   The patient will continue antiplatelet therapy as prescribed (since the patient is undergoing endovascular repair as opposed to open repair) as well as aggressive management of hyperlipidemia. Exercise is encouraged.   The patient is reminded that lifetime routine surveillance is a necessity with an endograft.   The risks and benefits of AAA repair are reviewed with the patient.  All questions are answered.  Alternative therapies are also discussed.  The patient agrees to proceed with endovascular aneurysm repair to prevent lethal rupture.  Patient will follow-up with me in the office after the surgery.  3. Atherosclerosis of native artery of both lower extremities with intermittent claudication (HCC)  Recommend:  The patient has evidence of atherosclerosis of the lower extremities with claudication.  The patient does not voice lifestyle limiting changes at this point in time.  Noninvasive studies do not suggest clinically significant change.  No invasive studies, angiography or surgery at this time The patient should continue walking and begin a more formal exercise program.  The patient should continue antiplatelet therapy and aggressive treatment of the lipid abnormalities  No changes in the patient's medications at this time  Continued surveillance is indicated as atherosclerosis is likely to progress with time.    The patient will continue follow up with noninvasive studies as ordered.   4. Chronic atrial fibrillation (HCC) Continue antiarrhythmia medications as already  ordered, these medications have been reviewed and there are no changes at this time.  Continue anticoagulation as ordered by Cardiology Service  5. DM (diabetes mellitus), type 2 with complications (HCC) Continue hypoglycemic medications as already ordered, these medications have been reviewed and there are no changes at this time.  Hgb A1C to be monitored as already arranged by primary service    [name], MD  01/14/2024 8:28 PM

## 2024-01-16 ENCOUNTER — Encounter (INDEPENDENT_AMBULATORY_CARE_PROVIDER_SITE_OTHER): Payer: Self-pay | Admitting: Vascular Surgery

## 2024-01-16 ENCOUNTER — Ambulatory Visit (INDEPENDENT_AMBULATORY_CARE_PROVIDER_SITE_OTHER): Admitting: Vascular Surgery

## 2024-01-16 VITALS — BP 93/62 | HR 94 | Resp 18 | Ht 74.0 in | Wt 164.8 lb

## 2024-01-16 DIAGNOSIS — I482 Chronic atrial fibrillation, unspecified: Secondary | ICD-10-CM

## 2024-01-16 DIAGNOSIS — E118 Type 2 diabetes mellitus with unspecified complications: Secondary | ICD-10-CM

## 2024-01-16 DIAGNOSIS — I7143 Infrarenal abdominal aortic aneurysm, without rupture: Secondary | ICD-10-CM

## 2024-01-16 DIAGNOSIS — I712 Thoracic aortic aneurysm, without rupture, unspecified: Secondary | ICD-10-CM

## 2024-01-16 DIAGNOSIS — I70213 Atherosclerosis of native arteries of extremities with intermittent claudication, bilateral legs: Secondary | ICD-10-CM

## 2024-01-21 ENCOUNTER — Encounter (INDEPENDENT_AMBULATORY_CARE_PROVIDER_SITE_OTHER): Payer: Self-pay

## 2024-01-28 ENCOUNTER — Telehealth (INDEPENDENT_AMBULATORY_CARE_PROVIDER_SITE_OTHER): Payer: Self-pay

## 2024-01-28 ENCOUNTER — Ambulatory Visit: Admitting: Pulmonary Disease

## 2024-01-28 ENCOUNTER — Encounter: Payer: Self-pay | Admitting: Pulmonary Disease

## 2024-01-28 VITALS — BP 96/60 | HR 72 | Temp 97.8°F | Ht 74.0 in | Wt 166.2 lb

## 2024-01-28 DIAGNOSIS — R918 Other nonspecific abnormal finding of lung field: Secondary | ICD-10-CM

## 2024-01-28 DIAGNOSIS — A310 Pulmonary mycobacterial infection: Secondary | ICD-10-CM | POA: Diagnosis not present

## 2024-01-28 DIAGNOSIS — I5020 Unspecified systolic (congestive) heart failure: Secondary | ICD-10-CM

## 2024-01-28 DIAGNOSIS — J449 Chronic obstructive pulmonary disease, unspecified: Secondary | ICD-10-CM | POA: Diagnosis not present

## 2024-01-28 DIAGNOSIS — F17210 Nicotine dependence, cigarettes, uncomplicated: Secondary | ICD-10-CM

## 2024-01-28 MED ORDER — AMOXICILLIN-POT CLAVULANATE 875-125 MG PO TABS: 1.0000 | ORAL_TABLET | Freq: Two times a day (BID) | ORAL | 0 refills | Status: AC

## 2024-01-28 MED ORDER — TRELEGY ELLIPTA 200-62.5-25 MCG/ACT IN AEPB: 1.0000 | INHALATION_SPRAY | Freq: Every day | RESPIRATORY_TRACT | 0 refills | Status: DC

## 2024-01-28 MED ORDER — TRELEGY ELLIPTA 200-62.5-25 MCG/ACT IN AEPB
1.0000 | INHALATION_SPRAY | Freq: Every day | RESPIRATORY_TRACT | 11 refills | Status: AC
Start: 2024-01-28 — End: ?

## 2024-01-28 NOTE — Telephone Encounter (Signed)
 Spoke with the patient and he is scheduled with Dr. Prescilla Brod for a AAA on 02/12/24 and pre-admit will call patient with a pre-op appt. Pre-surgical instructions were discussed and will be sent to Mychart and mailed.  Anesthesia was scheduled with MaryBeth.

## 2024-01-28 NOTE — Patient Instructions (Signed)
 VISIT SUMMARY:  During your visit, we discussed your ongoing respiratory symptoms and follow-up on your vascular stents. You mentioned feeling unwell, experiencing a hollow sensation in your lung, and occasional sputum production. We also reviewed your COPD management, potential Mycobacterium Avium Complex (MAC) infection, and GERD symptoms. You have an upcoming procedure for your aorta and have decided to switch your care to a new facility.  YOUR PLAN:  -CHRONIC OBSTRUCTIVE PULMONARY DISEASE (COPD): COPD is a chronic lung disease that makes it hard to breathe. You will continue using Trelegy at the increased dosage, and we provided you with samples. It's important to continue working on quitting smoking.  -MYCOBACTERIUM AVIUM COMPLEX (MAC): MAC is a type of bacterial infection that can affect the lungs. We prescribed antibiotics to address any potential infection. If your symptoms persist after your stent placement, we may consider a PET scan to assess lung activity and possibly a bronchoscopy for further evaluation.  -GASTROESOPHAGEAL REFLUX DISEASE (GERD): GERD is a condition where stomach acid frequently flows back into the tube connecting your mouth and stomach, causing irritation. You will continue taking Dexilant  and Pepcid  to manage your symptoms, which should help with nausea and coughing when lying down.  - PERIPHERAL VASCULAR DISEASE.  You have required stents in your aorta due to severe aneurysms.  You are still undergoing placement of the stents.  You have an upcoming procedure on 11 June.  INSTRUCTIONS:  Please continue using Trelegy as prescribed and work on quitting smoking. Take the antibiotics as directed. If your symptoms persist after your stent placement, we may need to do a PET scan and possibly a bronchoscopy. Continue taking Dexilant  and Pepcid  for your GERD. Follow up with your new care facility as planned.

## 2024-01-28 NOTE — Progress Notes (Signed)
 Subjective:    Patient ID: Ricardo Crooked., male    DOB: Oct 21, 1955, 68 y.o.   MRN: [id number]  Patient Care Team: Carollynn Cirri, NP as PCP - General (Internal Medicine)  Chief Complaint  Patient presents with   Follow-up    Cough, shortness of breath on exertion and wheezing.     BACKGROUND/INTERVAL:68 year old current smoker presents for evaluation of abnormalities noted on lung cancer screening CT.  Patient has a history of ischemic cardiomyopathy, stage III COPD, prior TB treated, atypical Mycobacterium infection and severe peripheral vascular disease.  Was initially evaluated here on 20 September 2023.   HPI Discussed the use of AI scribe software for clinical note transcription with the patient, who gave verbal consent to proceed.  History of Present Illness   Ricardo Vue. is a 68 year old male with COPD and a history of stent placements who presents with respiratory symptoms and follow-up on vascular stents.  He feels unwell and has a history of multiple stent placements, with an upcoming procedure scheduled for the aorta on June 11th. He has stents in his legs to maintain blood flow and anticipates additional procedures for these as well.  He experiences a hollow sensation in his lung, which was previously investigated with bronchoscopy at [facility] and another hospital in [place]. In 2022, an area in his lung was examined for suspected TB or MAC, but the diagnosis remains unclear. He feels better after receiving antibiotics during surgery, which significantly improved his symptoms.  He occasionally brings up sputum, but not daily, and notes that his lungs feel full, especially when lying down. He continues to smoke, having reduced his intake to two cigarettes today, and mentions difficulty with Chantix  due to nausea. He uses Trelegy for COPD, noting improvement with an increased dosage, although he sometimes takes a double dose of the lower strength.  He was advised that  this was not to be done due to the triple medication nature of the Trelegy.  He experiences rattling in his chest when lying down and speculates that it may be related to his GERD, as he notes GERD makes him cough when he lies down. He is currently taking Dexilant  and Pepcid  for GERD, which helps with nausea.  He has decided to switch his care from [facility] to [facility] due to dissatisfaction with previous care. He expresses frustration with the visa process for his wife, which has been ongoing for several years.   We discussed the process in his left upper lobe which is paraspinal in location.  The patient has needed interventions and has opted to defer further workup of this.  In addition he is reluctant to undergo bronchoscopy again.  CT angio chest performed 06 Jan 2024 which shows that the cavitary lesion in the left upper lobe is unchanged.  Discussed further workup to include PET/CT but the patient wishes to defer at present.  He has another upcoming stent placement on 11 June.    Review of Systems A 10 point review of systems was performed and it is as noted above otherwise negative.   Patient Active Problem List   Diagnosis Date Noted   Thoracic aortic aneurysm without rupture (HCC) 12/18/2023   AAA (abdominal aortic aneurysm) without rupture (HCC) 09/29/2023   Depression 04/12/2023   BPH (benign prostatic hyperplasia) 12/14/2021   GERD (gastroesophageal reflux disease) 07/31/2021   DM (diabetes mellitus), type 2 with complications (HCC) 07/31/2021   Stage 3 severe COPD by GOLD classification (HCC)  10/18/2020   HFrEF (heart failure with reduced ejection fraction) (HCC) 10/18/2020   History of stroke with residual effects 10/18/2020   Atrial fibrillation (HCC) 09/04/2020   Hyperlipidemia associated with type 2 diabetes mellitus (HCC) 07/04/2017   Atherosclerosis of native arteries of extremity with intermittent claudication (HCC) 06/19/2017   Erectile dysfunction 12/23/2015    Social  History   Tobacco Use   Smoking status: Every Day    Current packs/day: 3.00    Average packs/day: 3.0 packs/day for 10.4 years (31.2 ttl pk-yrs)    Types: Cigarettes    Start date: 09/03/2013   Smokeless tobacco: Never   Tobacco comments:    Smoking 4 cigarettes daily 09/20/2023    Smoking 2 -3 daily - 11/26/23  Substance Use Topics   Alcohol  use: Not Currently    Alcohol /week: 2.0 standard drinks of alcohol     Types: 2 Shots of liquor per week    Comment: occasional (every 3-4 weeks)     Allergies  Allergen Reactions   Chantix  [Varenicline ] Other (See Comments)    Depression    Current Meds  Medication Sig   acetaminophen  (TYLENOL ) 500 MG tablet Take 1,000 mg by mouth every 6 (six) hours as needed.   albuterol  (VENTOLIN  HFA) 108 (90 Base) MCG/ACT inhaler INHALE 2 PUFFS INTO THE LUNGS EVERY 6 HOURS AS NEEDED FOR WHEEZING OR SHORTNESS OF BREATH.   amoxicillin-clavulanate (AUGMENTIN) 875-125 MG tablet Take 1 tablet by mouth 2 (two) times daily for 10 days.   apixaban  (ELIQUIS ) 5 MG TABS tablet TAKE 1 TABLET TWICE DAILY   atorvastatin  (LIPITOR) 80 MG tablet TAKE 1 TABLET EVERY DAY   Dexlansoprazole  30 MG capsule DR Take 1 capsule (30 mg total) by mouth in the morning and at bedtime.   digoxin  (LANOXIN ) 0.25 MG tablet Take 1 tablet (250 mcg total) by mouth daily.   DULoxetine  (CYMBALTA ) 60 MG capsule TAKE 1 CAPSULE EVERY DAY   empagliflozin  (JARDIANCE ) 10 MG TABS tablet TAKE 1 TABLET EVERY DAY   famotidine  (PEPCID ) 40 MG tablet Take 1 tablet (40 mg total) by mouth daily.   finasteride  (PROSCAR ) 5 MG tablet Take 1 tablet (5 mg total) by mouth daily.   Fluticasone-Umeclidin-Vilant (TRELEGY ELLIPTA ) 200-62.5-25 MCG/ACT AEPB Inhale 1 puff into the lungs daily.   Fluticasone-Umeclidin-Vilant (TRELEGY ELLIPTA ) 200-62.5-25 MCG/ACT AEPB Inhale 1 Inhalation into the lungs daily in the afternoon.   gabapentin  (NEURONTIN ) 300 MG capsule TAKE 1 CAPSULE (300MG ) AT BEDTIME. IN ADDITION TO 600MG   FOR A TOTAL OF 900MG  AT BEDTIME   gabapentin  (NEURONTIN ) 600 MG tablet TAKE 1 TABLET THREE TIMES DAILY   hydrocortisone 1 % ointment Apply 1 Application topically as needed for itching.   metoprolol  succinate (TOPROL -XL) 25 MG 24 hr tablet TAKE 1 TABLET EVERY DAY   sildenafil  (VIAGRA ) 50 MG tablet TAKE 1 TABLET EVERY DAY AS NEEDED FOR ERECTILE DYSFUNCTION   spironolactone  (ALDACTONE ) 25 MG tablet Take 0.5 tablets (12.5 mg total) by mouth as needed.   tamsulosin  (FLOMAX ) 0.4 MG CAPS capsule Take 1 capsule (0.4 mg total) by mouth daily.   varenicline  (CHANTIX ) 1 MG tablet TAKE 1/2 TABLET DAILY FOR 3 DAYS, 1/2 TABLET TWICE DAILY FOR 4 DAYS, THEN TAKE 1 TABLET TWICE DAILY   [DISCONTINUED] Fluticasone-Umeclidin-Vilant (TRELEGY ELLIPTA ) 200-62.5-25 MCG/ACT AEPB Inhale 1 Inhalation into the lungs daily in the afternoon.   [DISCONTINUED] TRELEGY ELLIPTA  100-62.5-25 MCG/ACT AEPB INHALE 1 PUFF INTO THE LUNGS DAILY    Immunization History  Administered Date(s) Administered   Influenza-Unspecified 09/16/2020  Moderna Sars-Covid-2 Vaccination 09/14/2020   PFIZER Comirnaty(Gray Top)Covid-19 Tri-Sucrose Vaccine 10/12/2020        Objective:     BP 96/60 (BP Location: Left Arm, Patient Position: Sitting, Cuff Size: Normal)   Pulse 72   Temp 97.8 F (36.6 C) (Temporal)   Ht 6\' 2"  (1.88 m)   Wt 166 lb 3.2 oz (75.4 kg)   SpO2 96%   BMI 21.34 kg/m   SpO2: 96 %  GENERAL: Thin, well-developed gentleman in no acute distress.  He ambulates with the assistance of a cane. HEAD: Normocephalic, atraumatic.  EYES: Pupils equal, round, reactive to light.  No scleral icterus.  MOUTH: Poor dentition, upper dentures, partial lower.  Oral mucosa moist.  No thrush NECK: Supple. No thyromegaly. Trachea midline. No JVD.  No adenopathy. PULMONARY: Good air entry bilaterally.  Coarse, few scattered rhonchi. CARDIOVASCULAR: S1 and S2. Regular rate and rhythm.  No rubs, murmurs or gallops heard. ABDOMEN:  Benign. MUSCULOSKELETAL: No joint deformity, no clubbing, no edema.  NEUROLOGIC: No overt focal deficit, gait supported by cane.  Speech is fluent. SKIN: Intact,warm,dry.  Dusky feet. PSYCH: Flat affect and behavior normal.  Representative image from CT performed 06 Jan 2024 showing a cavitary lesion on the left upper lobe:    Assessment & Plan:     ICD-10-CM   1. Stage 3 severe COPD by GOLD classification (HCC)  J44.9     2. Multiple lung nodules on CT  R91.8     3. HFrEF (heart failure with reduced ejection fraction) (HCC)  I50.20     4. Tobacco dependence due to cigarettes  F17.210      Meds ordered this encounter  Medications   amoxicillin-clavulanate (AUGMENTIN) 875-125 MG tablet    Sig: Take 1 tablet by mouth 2 (two) times daily for 10 days.    Dispense:  20 tablet    Refill:  0   Fluticasone-Umeclidin-Vilant (TRELEGY ELLIPTA ) 200-62.5-25 MCG/ACT AEPB    Sig: Inhale 1 puff into the lungs daily.    Dispense:  60 each    Refill:  11   Fluticasone-Umeclidin-Vilant (TRELEGY ELLIPTA ) 200-62.5-25 MCG/ACT AEPB    Sig: Inhale 1 Inhalation into the lungs daily in the afternoon.    Dispense:  2 each    Refill:  0    Lot Number?:   [id number]    Expiration Date?:   05/04/2026    Manufacturer?:   GlaxoSmithKline [[id number]]    NDC:   1610-9604-54 [[id number]]    Quantity:   2   Discussion:    Chronic Obstructive Pulmonary Disease (COPD) with lower respiratory tract infection COPD with symptoms of lung fullness, especially when supine, and occasional clear sputum production. Continues to smoke, reduced to 2-4 cigarettes daily. Trelegy dosage increased previously, with symptom improvement. Occasionally takes double doses of the lower strength inhaler-advised that should not be known. 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 Current lung issues may relate to an infectious process near the stent area. Previous bronchoscopy attempted at [facility]. Antibiotics prescribed for potential infection. Consider PET scan if symptoms persist post-stent placement to assess lung activity. Depending on PET scan results, bronchoscopy may be considered for further evaluation.  Patient currently wants to defer further workup. - Prescribe antibiotics - Consider PET scan if symptoms persist after stent placement - Consider bronchoscopy based  on PET scan results  Gastroesophageal Reflux Disease (GERD) GERD with symptoms of coughing when supine, possibly contributing to lung symptoms. Managed with Dexilant  and Pepcid , which alleviate nausea.      Advised if symptoms do not improve or worsen, to please contact office for sooner follow up or seek emergency care.    I spent 40 minutes of dedicated to the care of this patient on the date of this encounter to include pre-visit review of records, face-to-face time with the patient discussing conditions above, post visit ordering of testing, clinical documentation with the electronic health record, making appropriate referrals as documented, and communicating necessary findings to members of the patients care team.     C. Chloe Counter, MD Advanced Bronchoscopy PCCM [facility]    *This note was generated using voice recognition software/Dragon and/or AI transcription program.  Despite best efforts to proofread, errors can occur which can change the meaning. Any transcriptional errors that result from this process are unintentional and may not be fully corrected at the time of dictation.

## 2024-01-31 ENCOUNTER — Other Ambulatory Visit: Payer: Self-pay | Admitting: Internal Medicine

## 2024-02-01 NOTE — Telephone Encounter (Signed)
 Requested Prescriptions  Pending Prescriptions Disp Refills   atorvastatin  (LIPITOR) 80 MG tablet [Pharmacy Med Name: Atorvastatin  Calcium  Oral Tablet 80 MG] 90 tablet 1    Sig: TAKE 1 TABLET EVERY DAY     Cardiovascular:  Antilipid - Statins Failed - 02/01/2024  3:05 PM      Failed - Lipid Panel in normal range within the last 12 months    Cholesterol, Total  Date Value Ref Range Status  12/23/2015 217 (H) 100 - 199 mg/dL Final   Cholesterol  Date Value Ref Range Status  01/07/2024 157 <200 mg/dL Final   LDL Cholesterol (Calc)  Date Value Ref Range Status  01/07/2024 98 mg/dL (calc) Final    Comment:    Reference range: <100 . Desirable range <100 mg/dL for primary prevention;   <70 mg/dL for patients with CHD or diabetic patients  with > or = 2 CHD risk factors. Aaron Aas LDL-C is now calculated using the Martin-Hopkins  calculation, which is a validated novel method providing  better accuracy than the Friedewald equation in the  estimation of LDL-C.  Melinda Sprawls et al. Erroll Heard. 1610;960(45): 2061-2068  (http://education.[employer].com/faq/FAQ164)    HDL  Date Value Ref Range Status  01/07/2024 36 (L) > OR = 40 mg/dL Final  40/98/1191 29 (L) >39 mg/dL Final   Triglycerides  Date Value Ref Range Status  01/07/2024 126 <150 mg/dL Final         Passed - Patient is not pregnant      Passed - Valid encounter within last 12 months    Recent Outpatient Visits           3 weeks ago Type 2 diabetes mellitus treated without insulin (HCC)   [place] [facility], Rankin Buzzard, NP       Future Appointments             In 1 week Dustin Gimenez, MD [facility] [place]             DULoxetine  (CYMBALTA ) 60 MG capsule [Pharmacy Med Name: DULoxetine  HCl Oral Capsule Delayed Release Particles 60 MG] 90 capsule 1    Sig: TAKE 1 CAPSULE EVERY DAY     Psychiatry: Antidepressants - SNRI - duloxetine  Passed - 02/01/2024  3:05 PM      Passed - Cr in  normal range and within 360 days    Creat  Date Value Ref Range Status  07/09/2023 0.82 0.70 - 1.35 mg/dL Final   Creatinine, Ser  Date Value Ref Range Status  12/19/2023 0.76 0.61 - 1.24 mg/dL Final   Creatinine, Urine  Date Value Ref Range Status  04/22/2023 86 20 - 320 mg/dL Final         Passed - eGFR is 30 or above and within 360 days    GFR, Est African American  Date Value Ref Range Status  12/18/2017 109 > OR = 60 mL/min/1.33m2 Final   GFR, Est Non African American  Date Value Ref Range Status  12/18/2017 94 > OR = 60 mL/min/1.66m2 Final   GFR, Estimated  Date Value Ref Range Status  12/19/2023 >60 >60 mL/min Final    Comment:    (NOTE) Calculated using the CKD-EPI Creatinine Equation (2021)    eGFR  Date Value Ref Range Status  07/09/2023 97 > OR = 60 mL/min/1.41m2 Final  11/14/2020 105 >59 mL/min/1.73 Final         Passed - Completed PHQ-2 or PHQ-9 in the last 360 days  Passed - Last BP in normal range    BP Readings from Last 1 Encounters:  01/28/24 96/60         Passed - Valid encounter within last 6 months    Recent Outpatient Visits           3 weeks ago Type 2 diabetes mellitus treated without insulin [facility])   [place] [facility] [place], Rankin Buzzard, NP       Future Appointments             In 1 week Dustin Gimenez, MD [facility] [place]

## 2024-02-05 ENCOUNTER — Other Ambulatory Visit (INDEPENDENT_AMBULATORY_CARE_PROVIDER_SITE_OTHER): Payer: Self-pay | Admitting: Nurse Practitioner

## 2024-02-05 DIAGNOSIS — I7143 Infrarenal abdominal aortic aneurysm, without rupture: Secondary | ICD-10-CM

## 2024-02-06 ENCOUNTER — Other Ambulatory Visit: Payer: Self-pay

## 2024-02-06 ENCOUNTER — Encounter
Admission: RE | Admit: 2024-02-06 | Discharge: 2024-02-06 | Disposition: A | Discharge status: Home / Self Care | Source: Ambulatory Visit | Attending: Vascular Surgery | Admitting: Vascular Surgery

## 2024-02-06 ENCOUNTER — Other Ambulatory Visit (INDEPENDENT_AMBULATORY_CARE_PROVIDER_SITE_OTHER): Payer: Self-pay | Admitting: Nurse Practitioner

## 2024-02-06 VITALS — BP 118/91 | HR 85 | Resp 18 | Ht 74.0 in | Wt 164.0 lb

## 2024-02-06 DIAGNOSIS — E118 Type 2 diabetes mellitus with unspecified complications: Secondary | ICD-10-CM | POA: Insufficient documentation

## 2024-02-06 DIAGNOSIS — Z01812 Encounter for preprocedural laboratory examination: Secondary | ICD-10-CM | POA: Insufficient documentation

## 2024-02-06 DIAGNOSIS — Z01818 Encounter for other preprocedural examination: Secondary | ICD-10-CM

## 2024-02-06 DIAGNOSIS — I7143 Infrarenal abdominal aortic aneurysm, without rupture: Secondary | ICD-10-CM | POA: Insufficient documentation

## 2024-02-06 HISTORY — DX: Inflammatory liver disease, unspecified: K75.9

## 2024-02-06 LAB — CBC WITH DIFFERENTIAL/PLATELET
Abs Immature Granulocytes: 0.09 10*3/uL — ABNORMAL HIGH (ref 0.00–0.07)
Basophils Absolute: 0.1 10*3/uL (ref 0.0–0.1)
Basophils Relative: 1 %
Eosinophils Absolute: 0.1 10*3/uL (ref 0.0–0.5)
Eosinophils Relative: 2 %
HCT: 43.5 % (ref 39.0–52.0)
Hemoglobin: 14 g/dL (ref 13.0–17.0)
Immature Granulocytes: 1 %
Lymphocytes Relative: 14 %
Lymphs Abs: 1 10*3/uL (ref 0.7–4.0)
MCH: 30 pg (ref 26.0–34.0)
MCHC: 32.2 g/dL (ref 30.0–36.0)
MCV: 93.1 fL (ref 80.0–100.0)
Monocytes Absolute: 0.6 10*3/uL (ref 0.1–1.0)
Monocytes Relative: 9 %
Neutro Abs: 5.6 10*3/uL (ref 1.7–7.7)
Neutrophils Relative %: 73 %
Platelets: 219 10*3/uL (ref 150–400)
RBC: 4.67 MIL/uL (ref 4.22–5.81)
RDW: 15.9 % — ABNORMAL HIGH (ref 11.5–15.5)
WBC: 7.5 10*3/uL (ref 4.0–10.5)
nRBC: 0 % (ref 0.0–0.2)

## 2024-02-06 LAB — TYPE AND SCREEN
ABO/RH(D): A POS
Antibody Screen: NEGATIVE

## 2024-02-06 LAB — BASIC METABOLIC PANEL WITH GFR
Anion gap: 10 (ref 5–15)
BUN: 20 mg/dL (ref 8–23)
CO2: 26 mmol/L (ref 22–32)
Calcium: 9.1 mg/dL (ref 8.9–10.3)
Chloride: 100 mmol/L (ref 98–111)
Creatinine, Ser: 0.75 mg/dL (ref 0.61–1.24)
GFR, Estimated: >60 mL/min (ref >60)
Glucose, Bld: 114 mg/dL — ABNORMAL HIGH (ref 70–99)
Potassium: 4 mmol/L (ref 3.5–5.1)
Sodium: 136 mmol/L (ref 135–145)

## 2024-02-06 LAB — SURGICAL PCR SCREEN
MRSA, PCR: NEGATIVE
Staphylococcus aureus: NEGATIVE

## 2024-02-06 NOTE — Patient Instructions (Addendum)
 Your procedure is scheduled on: Wednesday 02/12/24 Report to the Vascular Center at scheduled time (10 am)  If your arrival time is 6:00 am, do not arrive before that time as the [facility] entrance doors do not open until 6:00 am.  REMEMBER: Instructions that are not followed completely may result in serious medical risk, up to and including death; or upon the discretion of your surgeon and anesthesiologist your surgery may need to be rescheduled.  Do not eat food or drink any liquids after midnight the night before surgery.  No gum chewing or hard candies.  One week prior to surgery: Stop Anti-inflammatories (NSAIDS) such as Advil , Aleve , Ibuprofen , Motrin , Naproxen , Naprosyn  and Aspirin  based products such as Excedrin, Goody's Powder, BC Powder. You may however, continue to take Tylenol  if needed for pain up until the day of surgery.  Stop ANY OVER THE COUNTER supplements and vitamins until after surgery.  Continue taking all prescribed medications with the exception of the following: Eliquis , last dose will be Sunday 02/09/24. Jardiance , last dose Saturday 02/08/24  TAKE ONLY THESE MEDICATIONS THE MORNING OF SURGERY WITH A SIP OF WATER:  Dexlansoprazole  30 MG capsule DR  famotidine  (PEPCID ) 40 MG tablet  digoxin  (LANOXIN ) 0.25 MG tablet  DULoxetine  (CYMBALTA ) 60 MG capsule  gabapentin  (NEURONTIN ) 600 MG tablet  metoprolol  succinate (TOPROL -XL) 25 MG 24 hr tablet   Use inhalers on the day of surgery Barbra Ley) and bring to the hospital (Albuterol ).  No Alcohol  for 24 hours before or after surgery.  No Smoking including e-cigarettes for 24 hours before surgery.  No chewable tobacco products for at least 6 hours before surgery.  No nicotine patches on the day of surgery.  Do not use any "recreational" drugs for at least a week (preferably 2 weeks) before your surgery.  Please be advised that the combination of cocaine  and anesthesia may have negative outcomes, up to and including  death. If you test positive for cocaine , your surgery will be cancelled.  On the morning of surgery brush your teeth with toothpaste and water, you may rinse your mouth with mouthwash if you wish. Do not swallow any toothpaste or mouthwash.  Use CHG Soap or wipes as directed on instruction sheet.  Do not wear lotions, powders, or perfumes.   Do not shave body hair from the neck down 48 hours before surgery.  Wear comfortable clothing (specific to your surgery type) to the hospital.  Do not wear jewelry, make-up, hairpins, clips or nail polish.  For welded (permanent) jewelry: bracelets, anklets, waist bands, etc.  Please have this removed prior to surgery.  If it is not removed, there is a chance that hospital personnel will need to cut it off on the day of surgery. Contact lenses, hearing aids and dentures may not be worn into surgery.  Do not bring valuables to the hospital. [facility] is not responsible for any missing/lost belongings or valuables.   Notify your doctor if there is any change in your medical condition (cold, fever, infection).  If you are being discharged the day of surgery, you will not be allowed to drive home. You will need a responsible individual to drive you home and stay with you for 24 hours after surgery.   If you are taking public transportation, you will need to have a responsible individual with you.  If you are being admitted to the hospital overnight, leave your suitcase in the car. After surgery it may be brought to your room.  In  case of increased patient census, it may be necessary for you, the patient, to continue your postoperative care in the Same Day Surgery department.  After surgery, you can help prevent lung complications by doing breathing exercises.  Take deep breaths and cough every 1-2 hours. Your doctor may order a device called an Incentive Spirometer to help you take deep breaths. When coughing or sneezing, hold a pillow firmly  against your incision with both hands. This is called "splinting." Doing this helps protect your incision. It also decreases belly discomfort.  Surgery Visitation Policy:  Patients undergoing a surgery or procedure may have two family members or support persons with them as long as the person is not COVID-19 positive or experiencing its symptoms.   Inpatient Visitation:    Visiting hours are 7 a.m. to 8 p.m. Up to four visitors are allowed at one time in a patient room. The visitors may rotate out with other people during the day. One designated support person (adult) may remain overnight.  Please call the Pre-admissions Testing Dept. at [phone] if you have any questions about these instructions.     Preparing for Surgery with CHLORHEXIDINE  GLUCONATE (CHG) Soap  Chlorhexidine  Gluconate (CHG) Soap  o An antiseptic cleaner that kills germs and bonds with the skin to continue killing germs even after washing  o Used for showering the night before surgery and morning of surgery  Before surgery, you can play an important role by reducing the number of germs on your skin.  CHG (Chlorhexidine  gluconate) soap is an antiseptic cleanser which kills germs and bonds with the skin to continue killing germs even after washing.  Please do not use if you have an allergy to CHG or antibacterial soaps. If your skin becomes reddened/irritated stop using the CHG.  1. Shower the NIGHT BEFORE SURGERY and the MORNING OF SURGERY with CHG soap.  2. If you choose to wash your hair, wash your hair first as usual with your normal shampoo.  3. After shampooing, rinse your hair and body thoroughly to remove the shampoo.  4. Use CHG as you would any other liquid soap. You can apply CHG directly to the skin and wash gently with a scrungie or a clean washcloth.  5. Apply the CHG soap to your body only from the neck down. Do not use on open wounds or open sores. Avoid contact with your eyes, ears, mouth,  and genitals (private parts). Wash face and genitals (private parts) with your normal soap.  6. Wash thoroughly, paying special attention to the area where your surgery will be performed.  7. Thoroughly rinse your body with warm water.  8. Do not shower/wash with your normal soap after using and rinsing off the CHG soap.  9. Pat yourself dry with a clean towel.  10. Wear clean pajamas to bed the night before surgery.  12. Place clean sheets on your bed the night of your first shower and do not sleep with pets.  13. Shower again with the CHG soap on the day of surgery prior to arriving at the hospital.  14. Do not apply any deodorants/lotions/powders.  15. Please wear clean clothes to the hospital.

## 2024-02-07 ENCOUNTER — Ambulatory Visit: Payer: Self-pay | Admitting: Urology

## 2024-02-08 ENCOUNTER — Other Ambulatory Visit: Payer: Self-pay | Admitting: Internal Medicine

## 2024-02-08 ENCOUNTER — Other Ambulatory Visit: Payer: Self-pay | Admitting: Pulmonary Disease

## 2024-02-10 ENCOUNTER — Other Ambulatory Visit: Payer: Self-pay

## 2024-02-10 DIAGNOSIS — N401 Enlarged prostate with lower urinary tract symptoms: Secondary | ICD-10-CM

## 2024-02-10 MED ORDER — TAMSULOSIN HCL 0.4 MG PO CAPS: 0.4000 mg | ORAL_CAPSULE | Freq: Every day | ORAL | 11 refills | Status: AC

## 2024-02-10 NOTE — Telephone Encounter (Signed)
 Requested Prescriptions  Pending Prescriptions Disp Refills   albuterol  (VENTOLIN  HFA) 108 (90 Base) MCG/ACT inhaler [Pharmacy Med Name: Albuterol  Sulfate HFA Inhalation Aerosol Solution 108 (90 Base) MCG/ACT] 1 each 2    Sig: INHALE 2 PUFFS INTO THE LUNGS EVERY 6 HOURS AS NEEDED FOR WHEEZING OR SHORTNESS OF BREATH.     Pulmonology:  Beta Agonists 2 Failed - 02/10/2024  2:58 PM      Failed - Last BP in normal range    BP Readings from Last 1 Encounters:  02/06/24 (!) 118/91         Passed - Last Heart Rate in normal range    Pulse Readings from Last 1 Encounters:  02/06/24 85         Passed - Valid encounter within last 12 months    Recent Outpatient Visits           1 month ago Type 2 diabetes mellitus treated without insulin [facility])   [place] [facility] [place], Rankin Buzzard, NP       Future Appointments             In 4 days Dustin Gimenez, MD [facility] [place]

## 2024-02-11 ENCOUNTER — Encounter: Payer: Self-pay | Admitting: Vascular Surgery

## 2024-02-11 NOTE — Progress Notes (Signed)
 Perioperative / Anesthesia Services  Pre-Admission Testing Clinical Review / Pre-Operative Anesthesia Consult  Date: 02/11/24  PATIENT DEMOGRAPHICS: Name: Ricardo Cervantes. DOB: [birth year] MRN:   [id number]  Note: Available PAT nursing documentation and vital signs have been reviewed. Clinical nursing staff has updated patient's PMH/PSHx, current medication list, and drug allergies/intolerances to ensure complete and comprehensive history available to assist care teams in MDM as it pertains to the aforementioned surgical procedure and anticipated anesthetic course. Extensive review of available clinical information personally performed. Ricardo Cervantes PMH and PSHx updated with any diagnoses/procedures that  may have been inadvertently omitted during his intake with the pre-admission testing department's nursing staff.  PLANNED SURGICAL PROCEDURE(S):    Case: [id number] Date/Time: 02/12/24 1100   Procedure: ENDOVASCULAR REPAIR/STENT GRAFT   Anesthesia type: General   Diagnosis: Abdominal aortic aneurysm (AAA) without rupture, unspecified part (HCC) [I71.40]   Pre-op diagnosis: AAA   ANESTHESIA   GORE    AAA repair   Location: [facility] / [facility]   Providers: Prescilla Brod Ninette Basque, MD     CLINICAL DISCUSSION: Ricardo Cervantes. is a 68 y.o. male who is submitted for pre-surgical anesthesia review and clearance prior to him undergoing the above procedure.  Patient is a Current Smoker (31 pack years). 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   Patient is followed by cardiology Braxton Calico, MD). He was last seen in the cardiology clinic  on 12/10/2023; notes reviewed. At the time of his clinic visit, patient doing well overall from a cardiovascular perspective. Patient denied any chest pain, shortness of breath, PND, orthopnea, palpitations, significant peripheral edema, weakness, fatigue, vertiginous symptoms, or presyncope/syncope. Patient with a past medical history significant for cardiovascular diagnoses. Documented physical exam was grossly benign, providing no evidence of acute exacerbation and/or decompensation of the patient's known cardiovascular conditions. Of note, complete records regarding patient's cardiovascular history unavailable for review at time of consult. Information gathered from patient report and from notes provided by patient's local care providers.   Patient reported to have suffered a CVA in the past.  Type and location unknown.  Patient denies any type of residual deficits following neurological event.  Most recent myocardial perfusion imaging study was performed on 05/30/2017 revealing a low normal left ventricular systolic function with an EF of 50%.  There were no regional wall motion abnormalities.  No artifact or left ventricular cavity size enlargement appreciated on review of imaging. SPECT images demonstrated no evidence of stress-induced myocardial ischemia or arrhythmia; no scintigraphic evidence of scar.  TID ratio = 1.2. Study determined to be normal and low risk.   Patient underwent diagnostic LEFT heart catheterization on 10/06/2020 revealing normal coronary anatomy with no evidence of angiographically significant coronary artery disease.  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     Patient with a known past medical history significant for PVD.  Previous imaging has demonstrated aneurysmal dilatation of both the thoracic and abdominal portions of the aorta. CTA imaging of the chest was performed on 10/15/2023.  Falciform aneurysmal changes of the mid descending thoracic aorta measuring  up to 65 mm in maximal dimension was noted.  Descending thoracic aorta, just proximal to the hiatus, was  diffusely ectatic measuring up to 46 mm.  Additionally, there was a fusiform infrarenal aneurysmal dilatation of the aorta measuring up to 52 mm in maximum dimension.  Scattered atherosclerotic calcifications observed.  The RIGHT common femoral artery bifurcation was aneurysmal with occlusion of the superficial femoral artery despite prior RIGHT lower extremity bypass.   Most recent TTE performed on 11/26/2023 revealed a mildly reduced left ventricular systolic function with an EF of 45%. There was mild LVH.  There was hypokinesis of the anterior inferior septum.  Left ventricular diastolic Doppler parameters consistent with abnormal relaxation (G1DD). Right ventricular size and function normal with a TAPSE measuring 1.3 cm  (normal range >/= 1.6 cm). There was trivial mitral and tricuspid valve regurgitation. All transvalvular gradients were noted to be normal providing no evidence suggestive of valvular stenosis. Aorta normal in size with no evidence of ectasia or aneurysmal dilatation.  He endovascular thoracic aortic stent graft repair on 12/18/2023  placing 34 x 34 x 15 stent with a 40 x 40 x 20 stent and an additional 40 x 40 x 15 stent component.   Patient with an atrial fibrillation diagnosis; CHA2DS2-VASc Score = 7 (age, HFrEF, HTN, CVA x 2, vascular disease, T2DM). His rate and rhythm are currently being maintained on oral digoxin  + metoprolol  succinate. He is chronically anticoagulated using apixaban ; reported to be compliant with therapy with no evidence or reports of GI/GU bleeding.  Blood pressure low at 80/60 mmHg on currently prescribed beta-blocker (metoprolol  succinate) and diuretic (spironolactone ) therapies. He is on atorvastatin  for his HLD diagnosis and further ASCVD prevention.  Given patient's history of known cardiovascular diagnoses, it is important note that patient is on a PDE5i  medication (sildenafil ) for and erectile dysfunction diagnosis. T2DM well controlled on currently prescribed regimen; last HgbA1c was 6.8% when checked on 01/07/2024. In the setting of known cardiovascular diagnoses and concurrent T2DM, patient is taking an SGLT2i (empagliflozin ) for added cardiovascular and renovascular protection. Patient does not have an OSAH diagnosis.  Functional capacity limited by patient's overall respiratory status and other multiple medical comorbidities.  He is able to complete his ADLs/IADLs independently without cardiovascular limitation.  Per the DASI, patient felt to be able to achieve at least 4 METS of physical activity without experiencing any significant degrees of angina/anginal equivalent symptoms.  No changes were made to his medication regimen.  Patient to follow-up with outpatient cardiology in 3 months or sooner if needed.   Ricardo Crooked. is scheduled for an elective ENDOVASCULAR REPAIR/STENT GRAFT on 02/12/2024 with Dr. [name], MD. Given patient's past medical history significant for cardiovascular and cardiopulmonary diagnoses, presurgical clearances from both pulmonary medicine and cardiology were sought by the PAT team.    Per pulmonary medicine Viva Grise, MD), "patient appears to be compensated.  Despite his current symptoms he seems fairly compensated.  He has multiple comorbidities.  Aneurysm in danger of rupture therefore needs repair. Current ARISCAT score is 27 indicating INTERMEDIATE risk. There is a 13.3% risk of in-hospital postop pulmonary complications. Follow strict pulmonary hygiene postop. Early mobilization postop as tolerated".  Per cardiology (Custovic, DO), "this patient is optimized for surgery and may proceed with the planned procedural course with a ACCEPTABLE risk of significant perioperative cardiovascular complications".  Again, this patient is on daily oral anticoagulation therapy using a DOAC medication.  He has been  instructed on recommendations for holding his apixaban  for 2 days prior to his procedure with plans to restart as soon as postoperative bleeding risk felt  to be minimized by his primary attending surgeon. The patient has been instructed that his last dose of should be on 02/09/2024.  Patient reports previous perioperative complications with anesthesia in the past.  Patient advising of a past history of (+) difficulty maintaining adequate sedation/anesthesia and premature emergence.  He notes that the drug fentanyl  makes him "mean, grumpy, and difficult". In review his EMR, it is noted that patient underwent a general anesthetic course here at [facility] (ASA III) in 12/2023 without documented complications.   MOST RECENT VITAL SIGNS:    02/06/2024    8:27 AM 01/28/2024    2:10 PM 01/16/2024    1:33 PM  Vitals with BMI  Height 6\' 2"  6\' 2"  6\' 2"   Weight 164 lbs 166 lbs 3 oz 164 lbs 13 oz  BMI 21.05 21.33 21.15  Systolic 118 96 93  Diastolic 91 60 62  Pulse 85 72 94   PROVIDERS/SPECIALISTS: NOTE: Primary physician provider listed below. Patient may have been seen by APP or partner within same practice.   PROVIDER ROLE / SPECIALTY LAST OV  Schnier, Ninette Basque, MD Vascular Surgery (Surgeon) 01/16/2024  Carollynn Cirri, NP Primary Care Provider 01/07/2024  Isabell Manzanilla, DO Cardiology 12/10/2023  Coralie Derrick, MD Pulmonary Medicine 01/28/2024   ALLERGIES: No Active Allergies  CURRENT HOME MEDICATIONS: No current facility-administered medications for this encounter.    acetaminophen  (TYLENOL ) 500 MG tablet   albuterol  (VENTOLIN  HFA) 108 (90 Base) MCG/ACT inhaler   apixaban  (ELIQUIS ) 5 MG TABS tablet   atorvastatin  (LIPITOR) 80 MG tablet   Dexlansoprazole  30 MG capsule DR   digoxin  (LANOXIN ) 0.25 MG tablet   DULoxetine  (CYMBALTA ) 60 MG capsule   empagliflozin  (JARDIANCE ) 10 MG TABS tablet   famotidine  (PEPCID ) 40 MG tablet   finasteride  (PROSCAR )  5 MG tablet   Fluticasone-Umeclidin-Vilant (TRELEGY ELLIPTA ) 200-62.5-25 MCG/ACT AEPB   Fluticasone-Umeclidin-Vilant (TRELEGY ELLIPTA ) 200-62.5-25 MCG/ACT AEPB   gabapentin  (NEURONTIN ) 300 MG capsule   gabapentin  (NEURONTIN ) 600 MG tablet   hydrocortisone 1 % ointment   metoprolol  succinate (TOPROL -XL) 25 MG 24 hr tablet   sildenafil  (VIAGRA ) 50 MG tablet   spironolactone  (ALDACTONE ) 25 MG tablet   tamsulosin  (FLOMAX ) 0.4 MG CAPS capsule   varenicline  (CHANTIX ) 1 MG tablet   HISTORY: Past Medical History:  Diagnosis Date   Aneurysm of descending thoracic aorta without rupture (HCC)    a.) s/p TEVAR 12/18/2023 --> 34 x 34 x 15 stent with a 40 x 40 x 20 stent and an additional 40 x 40 x 15 stent component   Aortic atherosclerosis (HCC)    Atrial fibrillation (HCC)    a.) CHA2DS2VASc = 7 (age, HFrEF, HTN, CVA x 2, vascular disease, T2DM) as of 11/28/2023; b.) rate/rhythm maintained on oral digoxin  + metoprolol  succinate; chronically anticoagulated with apixaban    Atypical mycobacterial infection of lung (HCC)    Bipolar disorder (HCC)    BPH (benign prostatic hyperplasia)    CAD (coronary artery disease)    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    Complication of anesthesia    a.) difficulty maintaining adequate sedation/anesthesia; premature emergence; b.) fentanyl  makes patient "mean, grumpy, and difficult"   COPD (chronic obstructive pulmonary disease) (HCC)    CVA (cerebral vascular accident) (HCC)    DDD (degenerative disc disease), cervical    a.) s/p ACDF C6-C7   DDD (degenerative disc disease), lumbosacral    Depression    Diverticulosis    Dyspnea  Erectile dysfunction    a.) on PDE5i (sildenafil ) PRN   GERD (gastroesophageal reflux disease)    Headache    Hepatitis    Hepatitis C virus infection cured after antiviral drug therapy    a.) s/p completed Tx course of ledipasvir-sofosbuvir   HFrEF (heart failure with reduced ejection fraction) (HCC)    History of  bilateral cataract extraction 03/2022   History of kidney stones 2016   History of TB (tuberculosis)    HTN (hypertension)    Hyperlipidemia    Infrarenal abdominal aortic aneurysm (AAA) without rupture (HCC)    Myocardial bridge    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   On apixaban  therapy    Pericarditis    Peripheral vascular disease (HCC)    Pneumonia    Protein calorie malnutrition (HCC)    RA (rheumatoid arthritis) (HCC)    Smoker    T2DM (type 2 diabetes mellitus) (HCC)    Thoracic scoliosis    Past Surgical History:  Procedure Laterality Date   ABDOMINAL AORTOGRAM W/LOWER EXTREMITY N/A 01/15/2017   Procedure: Abdominal Aortogram w/Lower Extremity;  Surgeon: Jackquelyn Mass, MD;  Location: [facility];  Service: Cardiovascular;  Laterality: N/A;   ANTERIOR CERVICAL DECOMP/DISCECTOMY FUSION N/A 2004   FEMORAL-POPLITEAL BYPASS GRAFT Right 06/19/2017   Procedure: BYPASS GRAFT FEMORAL-POPLITEAL ARTERY;  Surgeon: Jackquelyn Mass, MD;  Location: [facility];  Service: Vascular;  Laterality: Right;   FEMORAL-POPLITEAL BYPASS GRAFT     had 2 on right leg , 1 on the left   fractured nose repair     as a child   liver abcess removed      LOWER EXTREMITY ANGIOGRAPHY Right 01/15/2017   Procedure: Lower Extremity Angiography;  Surgeon: Jackquelyn Mass, MD;  Location: [facility];  Service: Cardiovascular;  Laterality: Right;   LOWER EXTREMITY ANGIOGRAPHY Right 02/13/2017   Procedure: Lower Extremity Angiography;  Surgeon: Jackquelyn Mass, MD;  Location: [facility];  Service: Cardiovascular;  Laterality: Right;   PLACEMENT OF LUMBAR DRAIN N/A 12/18/2023   Procedure: PLACEMENT OF LUMBAR DRAIN;  Surgeon: Carroll Clamp, MD;  Location: [facility];  Service: Neurosurgery;  Laterality: N/A;   THORACIC AORTIC ENDOVASCULAR STENT GRAFT N/A 12/18/2023   Procedure: THORACIC AORTIC ENDOVASCULAR STENT GRAFT;  Surgeon: Jackquelyn Mass, MD;  Location: [facility];  Service: Cardiovascular;  Laterality: N/A;   VASECTOMY N/A    Family History  Problem Relation Age of Onset   Cancer Mother        liver   Heart disease Father    Diabetes Father    Breast cancer Sister    Brain cancer Sister    Social History   Tobacco Use   Smoking status: Every Day    Current packs/day: 3.00    Average packs/day: 3.0 packs/day for 10.4 years (31.3 ttl pk-yrs)    Types: Cigarettes    Start date: 09/03/2013   Smokeless tobacco: Never   Tobacco comments:    Smoking 4 cigarettes daily 09/20/2023    Smoking 2 -3 daily - 11/26/23  Substance Use Topics   Alcohol  use: Not Currently    Alcohol /week: 2.0 standard drinks of alcohol     Types: 2 Shots of liquor per week    Comment: occasional (every 3-4 weeks)    LABS:  Hospital Outpatient Visit on 02/06/2024  Component Date Value Ref Range Status   MRSA, PCR 02/06/2024 NEGATIVE  NEGATIVE Final   Staphylococcus aureus 02/06/2024 NEGATIVE  NEGATIVE Final   Comment: (NOTE) The Xpert SA Assay (FDA approved for NASAL specimens in patients 75 years of age and older), is one component of a comprehensive surveillance program. It is not intended to diagnose infection nor to guide or monitor treatment. Performed at [facility], [address] [place], [place] [address]    ABO/RH(D) 02/06/2024 A POS   Final   Antibody Screen 02/06/2024 NEG   Final   Sample Expiration 02/06/2024 02/20/2024,2359   Final   Extend sample reason 02/06/2024    Final                   Value:NO TRANSFUSIONS OR PREGNANCY IN THE PAST 3 MONTHS Performed at [facility], [address] [place], [place] [address]    Sodium 02/06/2024 136  135 - 145 mmol/L Final   Potassium 02/06/2024 4.0  3.5 - 5.1 mmol/L Final   Chloride 02/06/2024 100  98 - 111 mmol/L Final   CO2 02/06/2024 26  22 - 32 mmol/L Final   Glucose, Bld 02/06/2024 114 (H)  70 - 99 mg/dL Final   Glucose reference range  applies only to samples taken after fasting for at least 8 hours.   BUN 02/06/2024 20  8 - 23 mg/dL Final   Creatinine, Ser 02/06/2024 0.75  0.61 - 1.24 mg/dL Final   Calcium  02/06/2024 9.1  8.9 - 10.3 mg/dL Final   GFR, Estimated 02/06/2024 >60  >60 mL/min Final   Comment: (NOTE) Calculated using the CKD-EPI Creatinine Equation (2021)    Anion gap 02/06/2024 10  5 - 15 Final   Performed at [facility], [address] [place], [place] [address]   WBC 02/06/2024 7.5  4.0 - 10.5 K/uL Final   RBC 02/06/2024 4.67  4.22 - 5.81 MIL/uL Final   Hemoglobin 02/06/2024 14.0  13.0 - 17.0 g/dL Final   HCT 21/30/8657 43.5  39.0 - 52.0 % Final   MCV 02/06/2024 93.1  80.0 - 100.0 fL Final   MCH 02/06/2024 30.0  26.0 - 34.0 pg Final   MCHC 02/06/2024 32.2  30.0 - 36.0 g/dL Final   RDW 84/69/6295 15.9 (H)  11.5 - 15.5 % Final   Platelets 02/06/2024 219  150 - 400 K/uL Final   nRBC 02/06/2024 0.0  0.0 - 0.2 % Final   Neutrophils Relative % 02/06/2024 73  % Final   Neutro Abs 02/06/2024 5.6  1.7 - 7.7 K/uL Final   Lymphocytes Relative 02/06/2024 14  % Final   Lymphs Abs 02/06/2024 1.0  0.7 - 4.0 K/uL Final   Monocytes Relative 02/06/2024 9  % Final   Monocytes Absolute 02/06/2024 0.6  0.1 - 1.0 K/uL Final   Eosinophils Relative 02/06/2024 2  % Final   Eosinophils Absolute 02/06/2024 0.1  0.0 - 0.5 K/uL Final   Basophils Relative 02/06/2024 1  % Final   Basophils Absolute 02/06/2024 0.1  0.0 - 0.1 K/uL Final   Immature Granulocytes 02/06/2024 1  % Final   Abs Immature Granulocytes 02/06/2024 0.09 (H)  0.00 - 0.07 K/uL Final   Performed at [facility], [address] [place], [place] [address]    ECG: Date: 11/26/2023 Time ECG obtained: 1049 AM Rate: 71 bpm Rhythm: NSR; LAFB Axis (leads I and aVF): normal Intervals: PR 176 ms. QRS 110 ms. QTc 419 ms. ST segment and T wave changes: No evidence of acute T wave abnormalities or  significant ST segment elevation or depression.   Evidence of a possible, age undetermined, prior infarct:  No Comparison: Similar to previous tracing obtained on 06/13/2017.  LAFB now present   IMAGING / PROCEDURES: CT ANGIO CHEST/ABD/PELVIS POST EVAR W & OR WO CONTRAST performed on 01/06/2024 Post treatment changes from placement of a descending thoracic endograft without evidence of complication. The descending aneurysm is estimated at 6.0 x 5.3 cm. Infrarenal abdominal aortic aneurysm estimated at 5.1 x 4.9 cm. Cavitary lesion in the left upper lobe which is unchanged. Additional pulmonary findings are unchanged when compared to CT performed December 10, 2023.   TRANSTHORACIC ECHOCARDIOGRAM performed on 11/26/2023 Mildly reduced left ventricular systolic function with an EF of 45% Mild LVH Left ventricular diastolic Doppler parameters consistent with abnormal relaxation (G1DD). Normal right ventricular size and systolic function with a TAPSE of 1.3 cm Trivial MR and TR Normal gradients; no valvular stenosis  NM PET IMAGE INITIAL (PI) SKULL BASE TO THIGH performed on 09/18/2023 The somewhat tubular shaped cavitary process extending posteriorly in the medial left upper lobe has a maximum SUV of 7.6, and extends about 9 cm vertically with components of internal cavitation. Malignant etiology favored, atypical infection is a less likely differential diagnostic consideration. The bilobed nodule in the left upper lobe has a maximum SUV of 2.5, borderline for malignancy. The right upper lobe nodularity is below blood pool and probably benign but meriting surveillance. No hypermetabolic adenopathy in the chest. Fusiform infrarenal abdominal aortic aneurysm 5.1 cm in diameter, new from 11/23/2014 CT scan. There is also a descending thoracic aortic aneurysm at described on prior chest CT. Recommend referral to a vascular specialist. This recommendation follows ACR consensus guidelines: White Paper of the ACR Incidental Findings Committee II on  Vascular Findings. J Am Coll Radiol 2013; 10:789-794. Likely partially thrombosed aneurysm or pseudoaneurysm of the right common femoral artery with rim calcification and connections to a superficial vascular structure which may be venous. Coronary, aortic arch, and branch vessel atherosclerotic vascular disease. Severe emphysema. Mild sigmoid colon diverticulosis. Mild levoconvex thoracic scoliosis.  MYOCARDIAL PERFUSION IMAGING STUDY (LEXISCAN ) performed on 05/30/2017 Normal left ventricular systolic function with a low normal LVEF of 50% Normal myocardial thickening and wall motion Left ventricular cavity size normal SPECT images demonstrate homogenous tracer distribution throughout the myocardium TID ratio = 1.2 No evidence of stress-induced myocardial ischemia or arrhythmia Normal low risk study   IMPRESSION AND PLAN: Ricardo Crooked. has been referred for pre-anesthesia review and clearance prior to him undergoing the planned anesthetic and procedural courses. Available labs, pertinent testing, and imaging results were personally reviewed by me in preparation for upcoming operative/procedural course. [facility] medical record has been updated following extensive record review and patient interview with PAT staff.   This patient has been appropriately cleared by cardiology (ACCEPTABLE) and by pulmonary medicine (MODERATE) with the individually indicated risks of patient experiencing significant cardiovascular/cardiopulmonary complications during his perioperative course. Based on clinical review performed today (02/11/24), barring any significant acute changes in the patient's overall condition, it is anticipated that he will be able to proceed with the planned surgical intervention. Any acute changes in clinical condition may necessitate his procedure being postponed and/or cancelled. Patient will meet with anesthesia team (MD and/or CRNA) on the day of his procedure for preoperative  evaluation/assessment. Questions regarding anesthetic course will be fielded at that time.   Pre-surgical instructions were reviewed with the patient during his PAT appointment, and questions were fielded to satisfaction by  PAT clinical staff. He has been instructed on which medications that he will need to hold prior to surgery, as well as the ones that have been deemed safe/appropriate to take on the day of his procedure. As part of the general education provided by PAT, patient made aware both verbally and in writing, that he would need to abstain from the use of any illegal substances during his perioperative course. He was advised that failure to follow the provided instructions could necessitate case cancellation or result in serious perioperative complications up to and including death. Patient encouraged to contact PAT and/or his surgeon's office to discuss any questions or concerns that may arise prior to surgery; verbalized understanding.   Ricardo Caroline, MSN, APRN, FNP-C, CEN [facility]  Perioperative Services Nurse Practitioner Phone: [phone] Fax: [phone] 02/11/24 1:53 PM  NOTE: This note has been prepared using Dragon dictation software. Despite my best ability to proofread, there is always the potential that unintentional transcriptional errors may still occur from this process.

## 2024-02-12 ENCOUNTER — Encounter
Admission: RE | Disposition: A | Discharge status: Home / Self Care | Payer: Self-pay | Source: Home / Self Care | Attending: Vascular Surgery

## 2024-02-12 ENCOUNTER — Other Ambulatory Visit: Payer: Self-pay

## 2024-02-12 ENCOUNTER — Inpatient Hospital Stay: Payer: Self-pay | Admitting: Urgent Care

## 2024-02-12 ENCOUNTER — Encounter: Payer: Self-pay | Admitting: Vascular Surgery

## 2024-02-12 ENCOUNTER — Inpatient Hospital Stay
Admission: RE | Admit: 2024-02-12 | Discharge: 2024-02-13 | DRG: 269 | Disposition: A | Discharge status: Home / Self Care | Attending: Vascular Surgery | Admitting: Vascular Surgery

## 2024-02-12 DIAGNOSIS — I482 Chronic atrial fibrillation, unspecified: Secondary | ICD-10-CM | POA: Diagnosis present

## 2024-02-12 DIAGNOSIS — I2510 Atherosclerotic heart disease of native coronary artery without angina pectoris: Secondary | ICD-10-CM | POA: Diagnosis present

## 2024-02-12 DIAGNOSIS — E118 Type 2 diabetes mellitus with unspecified complications: Secondary | ICD-10-CM

## 2024-02-12 DIAGNOSIS — J449 Chronic obstructive pulmonary disease, unspecified: Secondary | ICD-10-CM | POA: Diagnosis present

## 2024-02-12 DIAGNOSIS — I70213 Atherosclerosis of native arteries of extremities with intermittent claudication, bilateral legs: Secondary | ICD-10-CM | POA: Diagnosis present

## 2024-02-12 DIAGNOSIS — E1140 Type 2 diabetes mellitus with diabetic neuropathy, unspecified: Secondary | ICD-10-CM | POA: Diagnosis present

## 2024-02-12 DIAGNOSIS — Z7901 Long term (current) use of anticoagulants: Secondary | ICD-10-CM

## 2024-02-12 DIAGNOSIS — M069 Rheumatoid arthritis, unspecified: Secondary | ICD-10-CM | POA: Diagnosis present

## 2024-02-12 DIAGNOSIS — E1151 Type 2 diabetes mellitus with diabetic peripheral angiopathy without gangrene: Secondary | ICD-10-CM | POA: Diagnosis present

## 2024-02-12 DIAGNOSIS — N400 Enlarged prostate without lower urinary tract symptoms: Secondary | ICD-10-CM | POA: Diagnosis present

## 2024-02-12 DIAGNOSIS — T82330A Leakage of aortic (bifurcation) graft (replacement), initial encounter: Secondary | ICD-10-CM | POA: Diagnosis not present

## 2024-02-12 DIAGNOSIS — I7143 Infrarenal abdominal aortic aneurysm, without rupture: Principal | ICD-10-CM | POA: Diagnosis present

## 2024-02-12 DIAGNOSIS — I5022 Chronic systolic (congestive) heart failure: Secondary | ICD-10-CM | POA: Diagnosis present

## 2024-02-12 DIAGNOSIS — E785 Hyperlipidemia, unspecified: Secondary | ICD-10-CM | POA: Diagnosis present

## 2024-02-12 DIAGNOSIS — F17210 Nicotine dependence, cigarettes, uncomplicated: Secondary | ICD-10-CM | POA: Diagnosis present

## 2024-02-12 DIAGNOSIS — I714 Abdominal aortic aneurysm, without rupture, unspecified: Principal | ICD-10-CM

## 2024-02-12 DIAGNOSIS — I110 Hypertensive heart disease with heart failure: Secondary | ICD-10-CM | POA: Diagnosis present

## 2024-02-12 HISTORY — DX: Other intervertebral disc degeneration, lumbosacral region without mention of lumbar back pain or lower extremity pain: M51.379

## 2024-02-12 HISTORY — PX: ENDOVASCULAR REPAIR/STENT GRAFT: CATH118280

## 2024-02-12 HISTORY — PX: ENDOVASCULAR STENT GRAFT (AAA): CATH118280

## 2024-02-12 LAB — CBC
HCT: 35 % — ABNORMAL LOW (ref 39.0–52.0)
Hemoglobin: 11.3 g/dL — ABNORMAL LOW (ref 13.0–17.0)
MCH: 30 pg (ref 26.0–34.0)
MCHC: 32.3 g/dL (ref 30.0–36.0)
MCV: 92.8 fL (ref 80.0–100.0)
Platelets: 147 10*3/uL — ABNORMAL LOW (ref 150–400)
RBC: 3.77 MIL/uL — ABNORMAL LOW (ref 4.22–5.81)
RDW: 15.9 % — ABNORMAL HIGH (ref 11.5–15.5)
WBC: 8.7 10*3/uL (ref 4.0–10.5)
nRBC: 0 % (ref 0.0–0.2)

## 2024-02-12 LAB — BASIC METABOLIC PANEL WITH GFR
Anion gap: 12 (ref 5–15)
BUN: 16 mg/dL (ref 8–23)
CO2: 21 mmol/L — ABNORMAL LOW (ref 22–32)
Calcium: 8.1 mg/dL — ABNORMAL LOW (ref 8.9–10.3)
Chloride: 101 mmol/L (ref 98–111)
Creatinine, Ser: 0.6 mg/dL — ABNORMAL LOW (ref 0.61–1.24)
GFR, Estimated: >60 mL/min (ref >60)
Glucose, Bld: 120 mg/dL — ABNORMAL HIGH (ref 70–99)
Potassium: 3.7 mmol/L (ref 3.5–5.1)
Sodium: 134 mmol/L — ABNORMAL LOW (ref 135–145)

## 2024-02-12 LAB — MAGNESIUM: Magnesium: 1.9 mg/dL (ref 1.7–2.4)

## 2024-02-12 LAB — PROTIME-INR
INR: 1.2 (ref 0.8–1.2)
Prothrombin Time: 15.8 s — ABNORMAL HIGH (ref 11.4–15.2)

## 2024-02-12 LAB — GLUCOSE, CAPILLARY
Glucose-Capillary: 123 mg/dL — ABNORMAL HIGH (ref 70–99)
Glucose-Capillary: 126 mg/dL — ABNORMAL HIGH (ref 70–99)
Glucose-Capillary: 135 mg/dL — ABNORMAL HIGH (ref 70–99)

## 2024-02-12 LAB — APTT: aPTT: 118 s — ABNORMAL HIGH (ref 24–36)

## 2024-02-12 SURGERY — ENDOVASCULAR STENT GRAFT (AAA)
Anesthesia: General

## 2024-02-12 MED ORDER — LIDOCAINE HCL (CARDIAC) PF 100 MG/5ML IV SOSY
PREFILLED_SYRINGE | INTRAVENOUS | Status: DC | PRN
  Administered 2024-02-12: 80 mg via INTRAVENOUS

## 2024-02-12 MED ORDER — FENTANYL CITRATE (PF) 100 MCG/2ML IJ SOLN
INTRAMUSCULAR | Status: DC | PRN
  Administered 2024-02-12 (×2): 50 ug via INTRAVENOUS

## 2024-02-12 MED ORDER — HEPARIN SODIUM (PORCINE) 1000 UNIT/ML IJ SOLN
INTRAMUSCULAR | Status: DC | PRN
  Administered 2024-02-12: 6000 [IU] via INTRAVENOUS

## 2024-02-12 MED ORDER — OXYCODONE-ACETAMINOPHEN 5-325 MG PO TABS
1.0000 | ORAL_TABLET | ORAL | Status: DC | PRN
  Administered 2024-02-12 – 2024-02-13 (×4): 2 via ORAL
  Filled 2024-02-12 (×4): qty 2

## 2024-02-12 MED ORDER — LABETALOL HCL 5 MG/ML IV SOLN: 10.0000 mg | INTRAVENOUS | Status: DC | PRN

## 2024-02-12 MED ORDER — DIGOXIN 250 MCG PO TABS
250.0000 ug | ORAL_TABLET | Freq: Every day | ORAL | Status: DC
  Administered 2024-02-13: 250 ug via ORAL
  Filled 2024-02-12 (×2): qty 1

## 2024-02-12 MED ORDER — LACTATED RINGERS IV SOLN: INTRAVENOUS | Status: DC

## 2024-02-12 MED ORDER — ENOXAPARIN SODIUM 40 MG/0.4ML IJ SOSY
40.0000 mg | PREFILLED_SYRINGE | INTRAMUSCULAR | Status: DC
  Administered 2024-02-13: 40 mg via SUBCUTANEOUS
  Filled 2024-02-12: qty 0.4

## 2024-02-12 MED ORDER — METOPROLOL SUCCINATE ER 50 MG PO TB24
25.0000 mg | ORAL_TABLET | Freq: Every day | ORAL | Status: DC
  Administered 2024-02-13: 25 mg via ORAL
  Filled 2024-02-12: qty 1

## 2024-02-12 MED ORDER — LACTATED RINGERS IV SOLN: INTRAVENOUS | Status: DC | PRN

## 2024-02-12 MED ORDER — DOCUSATE SODIUM 100 MG PO CAPS
100.0000 mg | ORAL_CAPSULE | Freq: Every day | ORAL | Status: DC
  Administered 2024-02-13: 100 mg via ORAL
  Filled 2024-02-12: qty 1

## 2024-02-12 MED ORDER — MIDAZOLAM HCL 5 MG/5ML IJ SOLN
INTRAMUSCULAR | Status: DC | PRN
  Administered 2024-02-12: 2 mg via INTRAVENOUS

## 2024-02-12 MED ORDER — OXYCODONE HCL 5 MG/5ML PO SOLN
5.0000 mg | Freq: Once | ORAL | Status: DC | PRN
  Filled 2024-02-12: qty 5

## 2024-02-12 MED ORDER — LIDOCAINE HCL (PF) 2 % IJ SOLN
INTRAMUSCULAR | Status: AC
  Filled 2024-02-12: qty 5

## 2024-02-12 MED ORDER — ROCURONIUM BROMIDE 10 MG/ML (PF) SYRINGE
PREFILLED_SYRINGE | INTRAVENOUS | Status: AC
  Filled 2024-02-12: qty 10

## 2024-02-12 MED ORDER — ROCURONIUM BROMIDE 100 MG/10ML IV SOLN
INTRAVENOUS | Status: DC | PRN
  Administered 2024-02-12: 10 mg via INTRAVENOUS
  Administered 2024-02-12: 60 mg via INTRAVENOUS

## 2024-02-12 MED ORDER — ONDANSETRON HCL 4 MG/2ML IJ SOLN: 4.0000 mg | Freq: Four times a day (QID) | INTRAMUSCULAR | Status: DC | PRN

## 2024-02-12 MED ORDER — BUDESON-GLYCOPYRROL-FORMOTEROL 160-9-4.8 MCG/ACT IN AERO
2.0000 | INHALATION_SPRAY | Freq: Two times a day (BID) | RESPIRATORY_TRACT | Status: DC
  Administered 2024-02-12 – 2024-02-13 (×2): 2 via RESPIRATORY_TRACT
  Filled 2024-02-12 (×2): qty 5.9

## 2024-02-12 MED ORDER — PHENOL 1.4 % MT LIQD: 1.0000 | OROMUCOSAL | Status: DC | PRN

## 2024-02-12 MED ORDER — MIDAZOLAM HCL 2 MG/2ML IJ SOLN
INTRAMUSCULAR | Status: AC
Start: 2024-02-12 — End: 2024-02-12
  Filled 2024-02-12: qty 2

## 2024-02-12 MED ORDER — FENTANYL CITRATE (PF) 100 MCG/2ML IJ SOLN
INTRAMUSCULAR | Status: AC
Start: 2024-02-12 — End: 2024-02-12
  Filled 2024-02-12: qty 2

## 2024-02-12 MED ORDER — ACETAMINOPHEN 325 MG RE SUPP: 325.0000 mg | RECTAL | Status: DC | PRN

## 2024-02-12 MED ORDER — EPHEDRINE 5 MG/ML INJ
INTRAVENOUS | Status: AC
  Filled 2024-02-12: qty 5

## 2024-02-12 MED ORDER — ALBUMIN HUMAN 5 % IV SOLN: INTRAVENOUS | Status: DC | PRN

## 2024-02-12 MED ORDER — POTASSIUM CHLORIDE CRYS ER 20 MEQ PO TBCR: 20.0000 meq | EXTENDED_RELEASE_TABLET | Freq: Every day | ORAL | Status: DC | PRN

## 2024-02-12 MED ORDER — GUAIFENESIN-DM 100-10 MG/5ML PO SYRP: 15.0000 mL | ORAL_SOLUTION | ORAL | Status: DC | PRN

## 2024-02-12 MED ORDER — POLYETHYLENE GLYCOL 3350 17 G PO PACK: 17.0000 g | PACK | Freq: Every day | ORAL | Status: DC | PRN

## 2024-02-12 MED ORDER — ONDANSETRON HCL 4 MG/2ML IJ SOLN
INTRAMUSCULAR | Status: AC
  Filled 2024-02-12: qty 2

## 2024-02-12 MED ORDER — DULOXETINE HCL 60 MG PO CPEP
60.0000 mg | ORAL_CAPSULE | Freq: Every day | ORAL | Status: DC
  Administered 2024-02-13: 60 mg via ORAL
  Filled 2024-02-12: qty 2
  Filled 2024-02-12 (×2): qty 1

## 2024-02-12 MED ORDER — PHENYLEPHRINE 80 MCG/ML (10ML) SYRINGE FOR IV PUSH (FOR BLOOD PRESSURE SUPPORT)
PREFILLED_SYRINGE | INTRAVENOUS | Status: DC | PRN
  Administered 2024-02-12 (×2): 80 ug via INTRAVENOUS

## 2024-02-12 MED ORDER — MAGNESIUM CITRATE PO SOLN: 1.0000 | Freq: Once | ORAL | Status: DC | PRN

## 2024-02-12 MED ORDER — IODIXANOL 320 MG/ML IV SOLN
INTRAVENOUS | Status: DC | PRN
Start: 2024-02-12 — End: 2024-02-12
  Administered 2024-02-12: 55 mL

## 2024-02-12 MED ORDER — CEFAZOLIN SODIUM-DEXTROSE 2-4 GM/100ML-% IV SOLN
2.0000 g | INTRAVENOUS | Status: AC
  Administered 2024-02-12: 2 g via INTRAVENOUS

## 2024-02-12 MED ORDER — METOPROLOL TARTRATE 5 MG/5ML IV SOLN: 2.0000 mg | INTRAVENOUS | Status: DC | PRN

## 2024-02-12 MED ORDER — DEXAMETHASONE SODIUM PHOSPHATE 10 MG/ML IJ SOLN
INTRAMUSCULAR | Status: AC
  Filled 2024-02-12: qty 1

## 2024-02-12 MED ORDER — ORAL CARE MOUTH RINSE: 15.0000 mL | Freq: Once | OROMUCOSAL | Status: AC

## 2024-02-12 MED ORDER — SODIUM CHLORIDE 0.9 % IV SOLN: INTRAVENOUS | Status: DC

## 2024-02-12 MED ORDER — GABAPENTIN 300 MG PO CAPS
300.0000 mg | ORAL_CAPSULE | Freq: Every day | ORAL | Status: DC
  Administered 2024-02-12: 300 mg via ORAL
  Filled 2024-02-12 (×2): qty 1

## 2024-02-12 MED ORDER — HEPARIN (PORCINE) IN NACL 1000-0.9 UT/500ML-% IV SOLN
INTRAVENOUS | Status: DC | PRN
Start: 2024-02-12 — End: 2024-02-12
  Administered 2024-02-12 (×2): 1500 mL

## 2024-02-12 MED ORDER — CEFAZOLIN SODIUM-DEXTROSE 2-4 GM/100ML-% IV SOLN
INTRAVENOUS | Status: AC
Start: 2024-02-12 — End: 2024-02-12
  Filled 2024-02-12: qty 100

## 2024-02-12 MED ORDER — ACETAMINOPHEN 325 MG PO TABS: 325.0000 mg | ORAL_TABLET | ORAL | Status: DC | PRN

## 2024-02-12 MED ORDER — ATORVASTATIN CALCIUM 80 MG PO TABS
80.0000 mg | ORAL_TABLET | Freq: Every day | ORAL | Status: DC
  Administered 2024-02-12: 80 mg via ORAL
  Filled 2024-02-12: qty 1

## 2024-02-12 MED ORDER — GABAPENTIN 300 MG PO CAPS
600.0000 mg | ORAL_CAPSULE | Freq: Three times a day (TID) | ORAL | Status: DC
  Administered 2024-02-12 – 2024-02-13 (×3): 600 mg via ORAL
  Filled 2024-02-12 (×4): qty 2

## 2024-02-12 MED ORDER — VASOPRESSIN 20 UNITS/100 ML INFUSION FOR SHOCK
INTRAVENOUS | Status: DC | PRN
  Administered 2024-02-12: 2 [IU]/h via INTRAVENOUS

## 2024-02-12 MED ORDER — PHENYLEPHRINE HCL-NACL 20-0.9 MG/250ML-% IV SOLN
INTRAVENOUS | Status: DC | PRN
  Administered 2024-02-12: 80 ug/min via INTRAVENOUS

## 2024-02-12 MED ORDER — LACTATED RINGERS IV SOLN
INTRAVENOUS | Status: DC | PRN
Start: 2024-02-12 — End: 2024-02-12

## 2024-02-12 MED ORDER — ALUM & MAG HYDROXIDE-SIMETH 200-200-20 MG/5ML PO SUSP: 15.0000 mL | ORAL | Status: DC | PRN

## 2024-02-12 MED ORDER — ONDANSETRON HCL 4 MG/2ML IJ SOLN
4.0000 mg | Freq: Four times a day (QID) | INTRAMUSCULAR | Status: DC | PRN
  Administered 2024-02-12: 4 mg via INTRAVENOUS

## 2024-02-12 MED ORDER — SODIUM CHLORIDE 0.9 % IV SOLN: 500.0000 mL | Freq: Once | INTRAVENOUS | Status: DC | PRN

## 2024-02-12 MED ORDER — EPHEDRINE SULFATE-NACL 50-0.9 MG/10ML-% IV SOSY
PREFILLED_SYRINGE | INTRAVENOUS | Status: DC | PRN
  Administered 2024-02-12 (×2): 5 mg via INTRAVENOUS

## 2024-02-12 MED ORDER — CEFAZOLIN SODIUM-DEXTROSE 2-4 GM/100ML-% IV SOLN
2.0000 g | Freq: Three times a day (TID) | INTRAVENOUS | Status: AC
  Administered 2024-02-12 – 2024-02-13 (×2): 2 g via INTRAVENOUS
  Filled 2024-02-12 (×3): qty 100

## 2024-02-12 MED ORDER — HYDRALAZINE HCL 20 MG/ML IJ SOLN: 5.0000 mg | INTRAMUSCULAR | Status: DC | PRN

## 2024-02-12 MED ORDER — CHLORHEXIDINE GLUCONATE 0.12 % MT SOLN
15.0000 mL | Freq: Once | OROMUCOSAL | Status: AC
  Administered 2024-02-12: 15 mL via OROMUCOSAL
  Filled 2024-02-12: qty 15

## 2024-02-12 MED ORDER — ACETAMINOPHEN 10 MG/ML IV SOLN: 1000.0000 mg | Freq: Once | INTRAVENOUS | Status: DC | PRN

## 2024-02-12 MED ORDER — CHLORHEXIDINE GLUCONATE CLOTH 2 % EX PADS
6.0000 | MEDICATED_PAD | Freq: Once | CUTANEOUS | Status: AC
  Administered 2024-02-12: 6 via TOPICAL

## 2024-02-12 MED ORDER — PANTOPRAZOLE SODIUM 40 MG PO TBEC
40.0000 mg | DELAYED_RELEASE_TABLET | Freq: Every day | ORAL | Status: DC
  Administered 2024-02-12 – 2024-02-13 (×2): 40 mg via ORAL
  Filled 2024-02-12 (×2): qty 1

## 2024-02-12 MED ORDER — HYDROMORPHONE HCL 1 MG/ML IJ SOLN: 1.0000 mg | Freq: Once | INTRAMUSCULAR | Status: DC | PRN

## 2024-02-12 MED ORDER — ONDANSETRON HCL 4 MG/2ML IJ SOLN: 4.0000 mg | Freq: Once | INTRAMUSCULAR | Status: DC | PRN

## 2024-02-12 MED ORDER — FINASTERIDE 5 MG PO TABS
5.0000 mg | ORAL_TABLET | Freq: Every day | ORAL | Status: DC
  Administered 2024-02-13: 5 mg via ORAL
  Filled 2024-02-12: qty 1

## 2024-02-12 MED ORDER — PROPOFOL 10 MG/ML IV BOLUS
INTRAVENOUS | Status: DC | PRN
Start: 2024-02-12 — End: 2024-02-12
  Administered 2024-02-12: 120 mg via INTRAVENOUS
  Administered 2024-02-12: 20 mg via INTRAVENOUS

## 2024-02-12 MED ORDER — OXYCODONE HCL 5 MG PO TABS: 5.0000 mg | ORAL_TABLET | Freq: Once | ORAL | Status: DC | PRN

## 2024-02-12 MED ORDER — TAMSULOSIN HCL 0.4 MG PO CAPS
0.4000 mg | ORAL_CAPSULE | Freq: Every day | ORAL | Status: DC
  Administered 2024-02-13: 0.4 mg via ORAL
  Filled 2024-02-12: qty 1

## 2024-02-12 MED ORDER — NITROGLYCERIN IN D5W 200-5 MCG/ML-% IV SOLN: 5.0000 ug/min | INTRAVENOUS | Status: DC

## 2024-02-12 MED ORDER — BISACODYL 5 MG PO TBEC: 5.0000 mg | DELAYED_RELEASE_TABLET | Freq: Every day | ORAL | Status: DC | PRN

## 2024-02-12 MED ORDER — MAGNESIUM SULFATE 2 GM/50ML IV SOLN: 2.0000 g | Freq: Every day | INTRAVENOUS | Status: DC | PRN

## 2024-02-12 MED ORDER — INSULIN ASPART 100 UNIT/ML IJ SOLN: 0.0000 [IU] | Freq: Every day | INTRAMUSCULAR | Status: DC

## 2024-02-12 MED ORDER — INSULIN ASPART 100 UNIT/ML IJ SOLN
0.0000 [IU] | Freq: Three times a day (TID) | INTRAMUSCULAR | Status: DC
  Administered 2024-02-13: 2 [IU] via SUBCUTANEOUS
  Administered 2024-02-13: 3 [IU] via SUBCUTANEOUS
  Filled 2024-02-12 (×3): qty 1

## 2024-02-12 MED ORDER — MORPHINE SULFATE (PF) 4 MG/ML IV SOLN
2.0000 mg | INTRAVENOUS | Status: DC | PRN
  Administered 2024-02-13: 2 mg via INTRAVENOUS
  Filled 2024-02-12: qty 1

## 2024-02-12 MED ORDER — DOPAMINE-DEXTROSE 3.2-5 MG/ML-% IV SOLN: 3.0000 ug/kg/min | INTRAVENOUS | Status: DC

## 2024-02-12 MED ORDER — SUGAMMADEX SODIUM 200 MG/2ML IV SOLN
INTRAVENOUS | Status: DC | PRN
  Administered 2024-02-12: 200 mg via INTRAVENOUS

## 2024-02-12 MED ORDER — APIXABAN 5 MG PO TABS
5.0000 mg | ORAL_TABLET | Freq: Two times a day (BID) | ORAL | Status: DC
  Administered 2024-02-13: 5 mg via ORAL
  Filled 2024-02-12: qty 1

## 2024-02-12 MED ORDER — DEXAMETHASONE SODIUM PHOSPHATE 10 MG/ML IJ SOLN
INTRAMUSCULAR | Status: DC | PRN
  Administered 2024-02-12: 10 mg via INTRAVENOUS

## 2024-02-12 MED ORDER — ALBUTEROL SULFATE (2.5 MG/3ML) 0.083% IN NEBU
3.0000 mL | INHALATION_SOLUTION | Freq: Four times a day (QID) | RESPIRATORY_TRACT | Status: DC | PRN
  Administered 2024-02-13: 3 mL via RESPIRATORY_TRACT
  Filled 2024-02-12: qty 3

## 2024-02-12 MED ORDER — CHLORHEXIDINE GLUCONATE CLOTH 2 % EX PADS: 6.0000 | MEDICATED_PAD | Freq: Once | CUTANEOUS | Status: DC

## 2024-02-12 MED ORDER — PROPOFOL 10 MG/ML IV BOLUS
INTRAVENOUS | Status: AC
  Filled 2024-02-12: qty 20

## 2024-02-12 MED ORDER — PHENYLEPHRINE 80 MCG/ML (10ML) SYRINGE FOR IV PUSH (FOR BLOOD PRESSURE SUPPORT)
PREFILLED_SYRINGE | INTRAVENOUS | Status: AC
  Filled 2024-02-12: qty 10

## 2024-02-12 MED ORDER — FENTANYL CITRATE (PF) 100 MCG/2ML IJ SOLN
25.0000 ug | INTRAMUSCULAR | Status: DC | PRN
  Administered 2024-02-12 (×2): 25 ug via INTRAVENOUS

## 2024-02-12 MED ORDER — VASOPRESSIN 20 UNIT/ML IV SOLN
INTRAVENOUS | Status: DC | PRN
Start: 2024-02-12 — End: 2024-02-12
  Administered 2024-02-12: 1 [IU] via INTRAVENOUS

## 2024-02-12 SURGICAL SUPPLY — 30 items
BRUSH SCRUB EZ 4% CHG (MISCELLANEOUS) IMPLANT
CATH ACCU-VU SIZ PIG 5F 70CM (CATHETERS) IMPLANT
CATH BALLN CODA 9X100X32 (BALLOONS) IMPLANT
CATH KUMPE SOFT-VU 5FR 65 (CATHETERS) IMPLANT
CLOSURE PERCLOSE PROSTYLE (VASCULAR PRODUCTS) IMPLANT
COVER DRAPE FLUORO 36X44 (DRAPES) IMPLANT
COVER PROBE ULTRASOUND 5X96 (MISCELLANEOUS) IMPLANT
DEVICE SAFEGUARD 24CM (GAUZE/BANDAGES/DRESSINGS) IMPLANT
DEVICE TORQUE (MISCELLANEOUS) IMPLANT
ELECTRODE REM PT RTRN 9FT ADLT (ELECTROSURGICAL) IMPLANT
EXCLUDER TNK END 32X14.5X14 18 (Endovascular Graft) IMPLANT
GLIDEWIRE STIFF .35X180X3 HYDR (WIRE) IMPLANT
GOWN STRL REUS W/ TWL LRG LVL3 (GOWN DISPOSABLE) IMPLANT
NDL ENTRY 21GA 7CM ECHOTIP (NEEDLE) IMPLANT
NEEDLE ENTRY 21GA 7CM ECHOTIP (NEEDLE) ×1 IMPLANT
PACK ANGIOGRAPHY (CUSTOM PROCEDURE TRAY) ×1 IMPLANT
PACK BASIN MAJOR ARMC (MISCELLANEOUS) IMPLANT
SET INTRO CAPELLA COAXIAL (SET/KITS/TRAYS/PACK) IMPLANT
SHEATH BRITE TIP 6FRX11 (SHEATH) IMPLANT
SHEATH BRITE TIP 8FRX11 (SHEATH) IMPLANT
SHEATH DRYSEAL FLEX 12FR 33CM (SHEATH) IMPLANT
SHEATH DRYSEAL FLEX 18FR 33CM (SHEATH) IMPLANT
STENT GRAFT CONTRALAT 16X20X9. (Endovascular Graft) IMPLANT
STENT GRAFT CONTRALAT 20X11.5 (Vascular Products) IMPLANT
SUT MNCRL+ 5-0 UNDYED PC-3 (SUTURE) IMPLANT
SYR MEDRAD MARK 7 150ML (SYRINGE) IMPLANT
TRAY FOLEY SLVR 16FR LF STAT (SET/KITS/TRAYS/PACK) IMPLANT
TUBING CONTRAST HIGH PRESS 72 (TUBING) IMPLANT
WIRE AMPLATZ SSTIFF .035X260CM (WIRE) IMPLANT
WIRE J 3MM .035X145CM (WIRE) IMPLANT

## 2024-02-12 NOTE — Anesthesia Postprocedure Evaluation (Signed)
 Anesthesia Post Note  Patient: Ricardo E Ground Jr.  Procedure(s) Performed: ENDOVASCULAR REPAIR/STENT GRAFT  Patient location during evaluation: PACU Anesthesia Type: General Level of consciousness: awake and alert Pain management: pain level controlled Vital Signs Assessment: post-procedure vital signs reviewed and stable Respiratory status: spontaneous breathing, nonlabored ventilation, respiratory function stable and patient connected to nasal cannula oxygen Cardiovascular status: blood pressure returned to baseline and stable Postop Assessment: no apparent nausea or vomiting Anesthetic complications: no   No notable events documented.   Last Vitals:  Vitals:   02/12/24 1407 02/12/24 1415  BP:  115/66  Pulse: 64 65  Resp: 11 16  Temp:  (!) 36.2 C  SpO2: 93% 97%    Last Pain:  Vitals:   02/12/24 1415  TempSrc:   PainSc: 2                  Ricardo Cervantes

## 2024-02-12 NOTE — Op Note (Signed)
 OPERATIVE NOTE   PROCEDURE: US  guidance for vascular access, bilateral femoral arteries Catheter placement into aorta from bilateral femoral approaches Placement of a C3 32 x 14 x 14 Gore Excluder Endoprosthesis main body with a 20 x 10 ipsilateral extender limb and a 20 x 12 contralateral limb ProGlide closure devices bilateral femoral arteries  PRE-OPERATIVE DIAGNOSIS: AAA  POST-OPERATIVE DIAGNOSIS: same  SURGEON: [name], MD and Mikki Alexander, MD - Co-surgeons  ANESTHESIA: general  ESTIMATED BLOOD LOSS: 25 cc  FINDING(S): 1.  AAA  SPECIMEN(S):  none  INDICATIONS:   Ricardo Cervantes. is a 68 y.o. y.o. male who presents with presents with an abdominal aortic aneurysm that is greater than 5 cm placing him at risk for lethal rupture.  The anatomy is suitable for endovascular repair.  Risks and benefits for repair of the abdominal aortic aneurysm using an endograft was described in detail to the patient all questions have been answered patient agrees to proceed.  Co-surgeons are utilized to expedite the procedure and reduce operative time improving patient's safety and improving outcome.  DESCRIPTION: After obtaining full informed written consent, the patient was brought back to the operating room and placed supine upon the operating table.  The patient received IV antibiotics prior to induction.  After obtaining adequate anesthesia, the patient was prepped and draped in the standard fashion for endovascular AAA repair.  Co-surgeons are required because this is a complex bilateral procedure with work being performed simultaneously from both the right femoral and left femoral approach.  This also expedites the procedure making a shorter operative time reducing complications and improving patient safety.  We then began by gaining access to both femoral arteries with US  guidance with me working on the patient's left and Dr. Vonna Guardian working on the patient's right.  The femoral arteries  were found to be patent and accessed without difficulty with a needle under ultrasound guidance without difficulty on each side and permanent images were recorded.  We then placed 2 proglide devices on each side in a pre-close fashion and placed 8 French sheaths.  The patient was then given 6000 units of intravenous heparin .   The Pigtail catheter was placed into the aorta from the right side. Using this image, we selected a 32 x 14 x 14 Main body device.  Over a stiff wire, an 49 French sheath was placed. The main body was then placed through the 18 French sheath.  Using the pigtail catheter that was placed up the right side and a magnified image at the renal arteries was performed. The main body was then deployed just below the lowest renal artery.  Pigtail catheter was then exchanged for a Kumpe catheter and the Kumpe catheter was used to cannulate the contralateral gate without difficulty and successful cannulation was confirmed by twirling a pigtail catheter in the main body. We then placed a stiff wire and a retrograde arteriogram was performed through the right femoral sheath. We upsized to the 12 [place] sheath for the contralateral limb and a 20 x 12 limb was selected and deployed. The main body deployment was then completed. Based off the angiographic findings, extension limbs were necessary.  A 20 x 10 ipsilateral extender limb was then advanced up the left side and deployed so that its distal edge was just proximal to the iliac bifurcation. All junction points and seals zones were treated with the compliant balloon.   The pigtail catheter was then replaced and a completion angiogram was performed.  Type II endoleak was detected on completion angiography. The renal arteries were found to be widely patent.    At this point we elected to terminate the procedure. We secured the pro glide devices for hemostasis on the femoral arteries. The skin incision was closed with a 4-0 Monocryl. Dermabond and  pressure dressing were placed. The patient was taken to the recovery room in stable condition having tolerated the procedure well.  COMPLICATIONS: none  CONDITION: stable  [name]  02/12/2024, 1:38 PM

## 2024-02-12 NOTE — Plan of Care (Signed)

## 2024-02-12 NOTE — Transfer of Care (Signed)
 Immediate Anesthesia Transfer of Care Note  Patient: Ricardo Cervantes.  Procedure(s) Performed: ENDOVASCULAR REPAIR/STENT GRAFT  Patient Location: PACU  Anesthesia Type:General  Level of Consciousness: awake  Airway & Oxygen Therapy: Patient Spontanous Breathing and Patient connected to face mask oxygen  Post-op Assessment: Report given to RN and Post -op Vital signs reviewed and stable  Post vital signs: Reviewed and stable  Last Vitals:  Vitals Value Taken Time  BP 123/66 02/12/24 1345  Temp    Pulse 63 02/12/24 1347  Resp 13 02/12/24 1347  SpO2 100 % 02/12/24 1347  Vitals shown include unfiled device data.  Last Pain:  Vitals:   02/12/24 1027  TempSrc: Oral  PainSc: 7          Complications: No notable events documented.

## 2024-02-12 NOTE — Anesthesia Procedure Notes (Addendum)
 Procedure Name: Intubation Date/Time: 02/12/2024 12:08 PM  Performed by: Ian Maine, RNPre-anesthesia Checklist: Patient identified, Emergency Drugs available, Suction available and Patient being monitored Patient Re-evaluated:Patient Re-evaluated prior to induction Oxygen Delivery Method: Circle system utilized Preoxygenation: Pre-oxygenation with 100% oxygen Induction Type: IV induction Ventilation: Mask ventilation without difficulty and Oral airway inserted - appropriate to patient size Laryngoscope Size: McGrath and 4 Tube type: Oral Tube size: 7.0 mm Number of attempts: 1 Airway Equipment and Method: Stylet and Oral airway Placement Confirmation: ETT inserted through vocal cords under direct vision, positive ETCO2 and breath sounds checked- equal and bilateral Secured at: 21 cm Tube secured with: Tape Dental Injury: Teeth and Oropharynx as per pre-operative assessment

## 2024-02-12 NOTE — Anesthesia Preprocedure Evaluation (Signed)
 Anesthesia Evaluation  Patient identified by MRN, date of birth, ID band Patient awake    Reviewed: Allergy & Precautions, NPO status , Patient's Chart, lab work & pertinent test results  History of Anesthesia Complications Negative for: history of anesthetic complications  Airway Mallampati: III   Neck ROM: Full    Dental  (+) Missing   Pulmonary COPD, Current SmokerPatient did not abstain from smoking.   Pulmonary exam normal breath sounds clear to auscultation       Cardiovascular hypertension, + Peripheral Vascular Disease (s/p fem pop bypass) and +CHF (EF 45%)  Normal cardiovascular exam Rhythm:Regular Rate:Normal  ECG 11/26/23:  Normal sinus rhythm Left anterior fascicular block Cannot rule out Anteroseptal infarct , age undetermined  Echo 11/26/23:  MILD LEFT VENTRICULAR SYSTOLIC DYSFUNCTION WITH MILD LVH  ESTIMATED EF: 45%, CALC EF(2D): 45%  NORMAL LA PRESSURES WITH DIASTOLIC DYSFUNCTION (GRADE 1)  NORMAL RIGHT VENTRICULAR SYSTOLIC FUNCTION  VALVULAR REGURGITATION: No AR, TRIVIAL MR, No PR, TRIVIAL TR  NO VALVULAR STENOSIS  EF 45%    Neuro/Psych  Headaches PSYCHIATRIC DISORDERS  Depression Bipolar Disorder   CVA, No Residual Symptoms    GI/Hepatic ,GERD  ,,(+) Hepatitis -, C  Endo/Other  diabetes, Type 2    Renal/GU Renal disease (nephrolithiasis)   BPH    Musculoskeletal  (+) Arthritis , Osteoarthritis and Rheumatoid disorders,    Abdominal   Peds  Hematology   Anesthesia Other Findings Reviewed and agree with Donnetta Gains pre-anesthesia clinical review note.    Cardiology note 12/10/23:  Assessment & Plan  Pruritus Reports significant itching, particularly on the arms, severe enough to cause skin damage from scratching. Itching is most intense at night and in the morning. Suspects it may be related to shingles, although the current location of the itching differs from previous shingles outbreaks.  Has been using cortisone cream, which provides temporary relief. - Consider evaluation for shingles if symptoms persist or worsen - Continue using cortisone cream for symptomatic relief  Heart failure with reduced ejection fraction (HFrEF) Heart function has improved, with ejection fraction increasing from 35% to 45%. This is a positive development, as 50-55% is considered optimal. All heart valves appear to be functioning well. - Continue current heart failure management regimen - Schedule follow-up appointment in three months to reassess heart function  Thoracic aortic aneurysm Scheduled for surgery next week to address the thoracic aortic aneurysm. Surgery was delayed due to the unavailability of a specialist. Recovery time is anticipated to be at least one month. - Proceed with scheduled surgery for thoracic aortic aneurysm  Travel and medication management Plans to travel after recovery from surgery and is concerned about medication availability and costs abroad. Intends to return in three months. Discussed the high cost of shipping medications and other items overseas. - Schedule follow-up appointment before or after the trip, depending on recovery and travel plans    Reproductive/Obstetrics                              Anesthesia Physical Anesthesia Plan  ASA: 3  Anesthesia Plan: General   Post-op Pain Management:    Induction:   PONV Risk Score and Plan:   Airway Management Planned: Oral ETT  Additional Equipment:   Intra-op Plan:   Post-operative Plan:   Informed Consent:   Plan Discussed with:   Anesthesia Plan Comments:          Anesthesia Quick Evaluation

## 2024-02-12 NOTE — Interval H&P Note (Signed)
 History and Physical Interval Note:  02/12/2024 10:25 AM  Ricardo Cervantes.  has presented today for surgery, with the diagnosis of AAA   ANESTHESIA   GORE    AAA repair.  The various methods of treatment have been discussed with the patient and family. After consideration of risks, benefits and other options for treatment, the patient has consented to  Procedure(s): ENDOVASCULAR REPAIR/STENT GRAFT (N/A) as a surgical intervention.  The patient's history has been reviewed, patient examined, no change in status, stable for surgery.  I have reviewed the patient's chart and labs.  Questions were answered to the patient's satisfaction.     [name]

## 2024-02-12 NOTE — Op Note (Signed)
 OPERATIVE NOTE   PROCEDURE: US  guidance for vascular access, bilateral femoral arteries Catheter placement into aorta from bilateral femoral approaches Placement of a 32 mm proximal, 14 mm distal, 14 cm Gore Excluder Endoprosthesis main body left with a 20 mm x 12 cm right iliac contralateral limb Placement of a 20 mm x 10 cm left iliac extension limb ProGlide closure devices bilateral femoral arteries  PRE-OPERATIVE DIAGNOSIS: AAA  POST-OPERATIVE DIAGNOSIS: same  SURGEON: Mikki Alexander, MD and [name], MD - Co-surgeons  ANESTHESIA: general  ESTIMATED BLOOD LOSS: 25 cc  FINDING(S): 1.  AAA  SPECIMEN(S):  none  INDICATIONS:   Ricardo Cervantes. is a 68 y.o. male who presents with a >5 cm AAA. He has already had his thoracic aneurysm repaired. The anatomy was suitable for endovascular repair.  Risks and benefits of repair in an endovascular fashion were discussed and informed consent was obtained. Co-surgeons are used to expedite the procedure and reduce operative time as bilateral work needs to be done.  DESCRIPTION: After obtaining full informed written consent, the patient was brought back to the operating room and placed supine upon the operating table.  The patient received IV antibiotics prior to induction.  After obtaining adequate anesthesia, the patient was prepped and draped in the standard fashion for endovascular AAA repair.  We then began by gaining access to both femoral arteries with US  guidance with me working on the right and Dr. Prescilla Brod working on the left.  The femoral arteries were found to be patent and accessed without difficulty with a needle under ultrasound guidance without difficulty on each side and permanent images were recorded.  We then placed 2 proglide devices on each side in a pre-close fashion and placed 8 French sheaths. The patient was then given 5000 units of intravenous heparin . The Pigtail catheter was placed into the aorta from the right  side. Using this image, we selected a 32 mm proximal, 14 cm long Gore Excluder C3 Main body device.  Over a stiff wire, an 64 French sheath was placed. The main body was then placed through the 18 French sheath. A Kumpe catheter was placed up the right side and a magnified image at the renal arteries was performed. The main body was then deployed just below the lowest renal artery. The Kumpe catheter was used to cannulate the contralateral gate without difficulty and successful cannulation was confirmed by twirling the pigtail catheter in the main body. We then placed a stiff wire and a retrograde arteriogram was performed through the right femoral sheath. We upsized to the 12 [place] sheath for the contralateral limb and a 20 mm x 12 cm right iliac limb was selected and deployed. The main body deployment was then completed. Based off the angiographic findings, extension limbs were necessary.  A 20 mm diameter x 10 cm length left iliac extension limb was then deployed to just above the left internal iliac artery . All junction points and seals zones were treated with the compliant balloon. The pigtail catheter was then replaced and a completion angiogram was performed.  A type II Endoleak was detected on completion angiography. The renal arteries were found to be widely patent. Both hypogastric arteries had flow as well. At this point we elected to terminate the procedure. We secured the pro glide devices for hemostasis on the femoral arteries. The skin incision was closed with a 4-0 Monocryl. Dermabond and pressure dressing were placed. The patient was taken to the recovery room  in stable condition having tolerated the procedure well.  COMPLICATIONS: none  CONDITION: stable  Mikki Alexander  02/12/2024, 1:35 PM   This note was created with Dragon Medical transcription system. Any errors in dictation are purely unintentional.

## 2024-02-13 ENCOUNTER — Encounter: Payer: Self-pay | Admitting: Vascular Surgery

## 2024-02-13 DIAGNOSIS — Z9889 Other specified postprocedural states: Secondary | ICD-10-CM

## 2024-02-13 LAB — CBC
HCT: 38 % — ABNORMAL LOW (ref 39.0–52.0)
Hemoglobin: 11.9 g/dL — ABNORMAL LOW (ref 13.0–17.0)
MCH: 29.6 pg (ref 26.0–34.0)
MCHC: 31.3 g/dL (ref 30.0–36.0)
MCV: 94.5 fL (ref 80.0–100.0)
Platelets: 185 10*3/uL (ref 150–400)
RBC: 4.02 MIL/uL — ABNORMAL LOW (ref 4.22–5.81)
RDW: 15.8 % — ABNORMAL HIGH (ref 11.5–15.5)
WBC: 8.8 10*3/uL (ref 4.0–10.5)
nRBC: 0 % (ref 0.0–0.2)

## 2024-02-13 LAB — BASIC METABOLIC PANEL WITH GFR
Anion gap: 8 (ref 5–15)
BUN: 17 mg/dL (ref 8–23)
CO2: 26 mmol/L (ref 22–32)
Calcium: 8.8 mg/dL — ABNORMAL LOW (ref 8.9–10.3)
Chloride: 101 mmol/L (ref 98–111)
Creatinine, Ser: 0.68 mg/dL (ref 0.61–1.24)
GFR, Estimated: >60 mL/min (ref >60)
Glucose, Bld: 184 mg/dL — ABNORMAL HIGH (ref 70–99)
Potassium: 4.2 mmol/L (ref 3.5–5.1)
Sodium: 135 mmol/L (ref 135–145)

## 2024-02-13 LAB — GLUCOSE, CAPILLARY
Glucose-Capillary: 150 mg/dL — ABNORMAL HIGH (ref 70–99)
Glucose-Capillary: 152 mg/dL — ABNORMAL HIGH (ref 70–99)

## 2024-02-13 LAB — DIGOXIN LEVEL: Digoxin Level: 0.9 ng/mL (ref 0.8–2.0)

## 2024-02-13 MED ORDER — OXYCODONE-ACETAMINOPHEN 5-325 MG PO TABS: 1.0000 | ORAL_TABLET | ORAL | 0 refills | Status: DC | PRN

## 2024-02-13 MED ORDER — ASPIRIN 81 MG PO TBEC: 81.0000 mg | DELAYED_RELEASE_TABLET | Freq: Every day | ORAL | 2 refills | Status: AC

## 2024-02-13 MED ORDER — CHLORHEXIDINE GLUCONATE CLOTH 2 % EX PADS
6.0000 | MEDICATED_PAD | Freq: Every day | CUTANEOUS | Status: DC
  Administered 2024-02-13: 6 via TOPICAL

## 2024-02-13 NOTE — Discharge Summary (Addendum)
 [facility]    Discharge Summary    Patient ID:  Ricardo Cervantes. MRN: [id number] DOB/AGE: [birth year] 66 y.o.  Admit date: 02/12/2024 Discharge date: 02/13/2024 Date of Surgery: 02/12/2024 Surgeon: Surgeon(s): Schnier, Ninette Basque, MD [employer], MD  Admission Diagnosis: Abdominal aortic aneurysm (AAA) without rupture, unspecified part (HCC) [I71.40] AAA (abdominal aortic aneurysm) without rupture [facility]) [I71.40]  Discharge Diagnoses:  Abdominal aortic aneurysm (AAA) without rupture, unspecified part (HCC) [I71.40] AAA (abdominal aortic aneurysm) without rupture (HCC) [I71.40]  Secondary Diagnoses: Past Medical History:  Diagnosis Date   Aneurysm of descending thoracic aorta without rupture (HCC)    a.) s/p TEVAR 12/18/2023 --> 34 x 34 x 15 stent with a 40 x 40 x 20 stent and an additional 40 x 40 x 15 stent component   Aortic atherosclerosis (HCC)    Atrial fibrillation (HCC)    a.) CHA2DS2VASc = 7 (age, HFrEF, HTN, CVA x 2, vascular disease, T2DM) as of 11/28/2023; b.) rate/rhythm maintained on oral digoxin  + metoprolol  succinate; chronically anticoagulated with apixaban    Atypical mycobacterial infection of lung (HCC)    Bipolar disorder (HCC)    BPH (benign prostatic hyperplasia)    CAD (coronary artery disease)    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    Complication of anesthesia    a.) difficulty maintaining adequate sedation/anesthesia; premature emergence; b.) fentanyl  makes patient mean, grumpy, and difficult   COPD (chronic obstructive pulmonary disease) (HCC)    CVA (cerebral vascular accident) (HCC)    DDD (degenerative disc disease), cervical    a.) s/p ACDF C6-C7   DDD (degenerative disc disease), lumbosacral    Depression    Diverticulosis    Dyspnea    Erectile dysfunction    a.) on PDE5i (sildenafil ) PRN   GERD (gastroesophageal reflux disease)    Headache    Hepatitis    Hepatitis C virus infection cured after  antiviral drug therapy    a.) s/p completed Tx course of ledipasvir-sofosbuvir   HFrEF (heart failure with reduced ejection fraction) (HCC)    History of bilateral cataract extraction 03/2022   History of kidney stones 2016   History of TB (tuberculosis)    HTN (hypertension)    Hyperlipidemia    Infrarenal abdominal aortic aneurysm (AAA) without rupture (HCC)    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 is a 68 y.o. y.o. male who presents with presents with an abdominal aortic aneurysm that is greater than 5 cm placing him at risk for lethal rupture.   PROCEDURE: US  guidance for vascular access, bilateral femoral arteries Catheter placement into aorta from bilateral femoral approaches Placement of a C3 32 x 14 x 14 Gore Excluder Endoprosthesis main body with a 20 x 10 ipsilateral extender limb and a 20 x 12 contralateral limb ProGlide closure devices bilateral femoral arteries  Hospital Course:  Ricardo Cervantes. is a 68 y.o. male is S/P AAA Endovascular repair with stent placement with bilateral groin punctures.  Patient is resting comfortably this  morning in ICU.  She is recovering as expected.  Patient continues to have his normal level of neuropathy to his bilateral lower extremities.  Bilateral lower extremities this morning warm to touch with palpable pulses.  No complications to note.  Bilateral groins with dressings clean dry and intact.  No hematoma seroma noted.  Patient is being discharged on aspirin  81 mg daily, Eliquis  5 mg twice daily and Lipitor 80 mg daily.  Patient was counseled and  instructed not to miss or skip any of his medications as it will alter the outcome of his procedure.  Patient verbalizes understanding   I spent greater than 60 minutes preparing and discharging the patient with doing bedside teaching for wound care, medications and follow-up.   Extubated: POD # 0 Physical Exam:  Alert notes x3, no acute distress Face: Symmetrical.  Tongue is midline. Neck: Trachea is midline.  No swelling or bruising. Cardiovascular: Regular rate and rhythm Pulmonary: Clear to auscultation bilaterally Abdomen: Soft, nontender, nondistended Right groin access: Clean dry and intact.  No swelling or drainage noted Left groin access: Clean dry and intact.  No swelling or drainage noted Left lower extremity: Thigh soft.  Calf soft.  Extremities warm distally toes.  Hard to palpate pedal pulses however the foot is warm is her good capillary refill. Right lower extremity: Thigh soft.  Calf soft.  Extremities warm distally toes.  Hard to palpate pedal pulses however the foot is warm is her good capillary refill. Neurological: No deficits noted   Post-op wounds:  clean, dry, intact or healing well  Pt. Ambulating, voiding and taking PO diet without difficulty. Pt pain controlled with PO pain meds.  Labs:  As below  Complications: none  Consults:    Significant Diagnostic Studies: CBC Lab Results  Component Value Date   WBC 8.8 02/13/2024   HGB 11.9 (L) 02/13/2024   HCT 38.0 (L) 02/13/2024   MCV 94.5 02/13/2024   PLT 185 02/13/2024    BMET    Component Value Date/Time   NA 135 02/13/2024 0339   NA 137 11/14/2020 1409   K 4.2 02/13/2024 0339   CL 101 02/13/2024 0339   CO2 26 02/13/2024 0339   GLUCOSE 184 (H) 02/13/2024 0339   BUN 17 02/13/2024 0339   BUN 15 11/14/2020 1409   CREATININE 0.68 02/13/2024 0339   CREATININE 0.82 07/09/2023 1341   CALCIUM  8.8 (L) 02/13/2024 0339   GFRNONAA >60 02/13/2024 0339   GFRNONAA 94 12/18/2017 1137   GFRAA 109  12/18/2017 1137   COAG Lab Results  Component Value Date   INR 1.2 02/12/2024   INR 1.2 12/18/2023   INR 1.10 06/13/2017     Disposition:  Discharge to :Home  Allergies as of 02/13/2024   No Active Allergies      Medication List     TAKE these medications    acetaminophen  500 MG tablet Commonly known as: TYLENOL  Take 1,000 mg by mouth every 6 (six) hours as needed.   albuterol  108 (90 Base) MCG/ACT inhaler Commonly known as: VENTOLIN  HFA INHALE 2 PUFFS INTO THE LUNGS EVERY 6 HOURS AS NEEDED FOR WHEEZING OR SHORTNESS OF BREATH.   aspirin  EC 81 MG tablet Take 1 tablet (81 mg total) by mouth daily.   atorvastatin  80 MG tablet Commonly known as: LIPITOR TAKE 1 TABLET EVERY DAY What changed: when to take this   Dexlansoprazole  30 MG capsule DR Take 1 capsule (30 mg total) by mouth in the morning and at  bedtime.   digoxin  0.25 MG tablet Commonly known as: LANOXIN  Take 1 tablet (250 mcg total) by mouth daily.   DULoxetine  60 MG capsule Commonly known as: CYMBALTA  TAKE 1 CAPSULE EVERY DAY   Eliquis  5 MG Tabs tablet Generic drug: apixaban  TAKE 1 TABLET TWICE DAILY   famotidine  40 MG tablet Commonly known as: PEPCID  Take 1 tablet (40 mg total) by mouth daily.   finasteride  5 MG tablet Commonly known as: PROSCAR  Take 1 tablet (5 mg total) by mouth daily.   gabapentin  600 MG tablet Commonly known as: NEURONTIN  TAKE 1 TABLET THREE TIMES DAILY   gabapentin  300 MG capsule Commonly known as: NEURONTIN  TAKE 1 CAPSULE (300MG ) AT BEDTIME. IN ADDITION TO 600MG  FOR A TOTAL OF 900MG  AT BEDTIME   hydrocortisone 1 % ointment Apply 1 Application topically as needed for itching.   Jardiance  10 MG Tabs tablet Generic drug: empagliflozin  TAKE 1 TABLET EVERY DAY   metoprolol  succinate 25 MG 24 hr tablet Commonly known as: TOPROL -XL TAKE 1 TABLET EVERY DAY   oxyCODONE -acetaminophen  5-325 MG tablet Commonly known as: PERCOCET/ROXICET Take 1-2 tablets by mouth every  4 (four) hours as needed for moderate pain (pain score 4-6) or severe pain (pain score 7-10) ([id number]).   sildenafil  50 MG tablet Commonly known as: VIAGRA  TAKE 1 TABLET EVERY DAY AS NEEDED FOR ERECTILE DYSFUNCTION   spironolactone  25 MG tablet Commonly known as: ALDACTONE  Take 0.5 tablets (12.5 mg total) by mouth as needed.   tamsulosin  0.4 MG Caps capsule Commonly known as: FLOMAX  Take 1 capsule (0.4 mg total) by mouth daily.   Trelegy Ellipta  200-62.5-25 MCG/ACT Aepb Generic drug: Fluticasone-Umeclidin-Vilant Inhale 1 puff into the lungs daily.   Trelegy Ellipta  200-62.5-25 MCG/ACT Aepb Generic drug: Fluticasone-Umeclidin-Vilant Inhale 1 Inhalation into the lungs daily in the afternoon.   varenicline  1 MG tablet Commonly known as: CHANTIX  TAKE 1/2 TABLET DAILY FOR 3 DAYS, 1/2 TABLET TWICE DAILY FOR 4 DAYS, THEN TAKE 1 TABLET TWICE DAILY       Verbal and written Discharge instructions given to the patient. Wound care per Discharge AVS  Follow-up Information     Schnier, Ninette Basque, MD Follow up in 4 week(s).   Specialties: Vascular Surgery, Cardiology, Radiology, Vascular Surgery Why: EVAR Contact information: [address] [place] [address] [phone]                 Signed: Annamaria Barrette, NP  02/13/2024, 8:34 AM

## 2024-02-13 NOTE — Plan of Care (Signed)
 The patient has been discharged. IV has been removed. Education has been completed using the teach back method. The patient has been wheeled to his car. The pt's son is here to pick up the patient.  Problem: Education: Goal: Knowledge of General Education information will improve Description: Including pain rating scale, medication(s)/side effects and non-pharmacologic comfort measures Outcome: Completed/Met   Problem: Health Behavior/Discharge Planning: Goal: Ability to manage health-related needs will improve Outcome: Completed/Met   Problem: Clinical Measurements: Goal: Ability to maintain clinical measurements within normal limits will improve Outcome: Completed/Met Goal: Will remain free from infection Outcome: Completed/Met Goal: Diagnostic test results will improve Outcome: Completed/Met Goal: Respiratory complications will improve Outcome: Completed/Met Goal: Cardiovascular complication will be avoided Outcome: Completed/Met   Problem: Activity: Goal: Risk for activity intolerance will decrease Outcome: Completed/Met   Problem: Nutrition: Goal: Adequate nutrition will be maintained Outcome: Completed/Met   Problem: Coping: Goal: Level of anxiety will decrease Outcome: Completed/Met   Problem: Elimination: Goal: Will not experience complications related to bowel motility Outcome: Completed/Met Goal: Will not experience complications related to urinary retention Outcome: Completed/Met   Problem: Pain Managment: Goal: General experience of comfort will improve and/or be controlled Outcome: Completed/Met   Problem: Safety: Goal: Ability to remain free from injury will improve Outcome: Completed/Met   Problem: Skin Integrity: Goal: Risk for impaired skin integrity will decrease Outcome: Completed/Met   Problem: Education: Goal: Knowledge of discharge needs will improve Outcome: Completed/Met   Problem: Clinical Measurements: Goal: Postoperative  complications will be avoided or minimized Outcome: Completed/Met   Problem: Respiratory: Goal: Will achieve and/or maintain a regular respiratory rate, without signs or symptoms of dyspnea Outcome: Completed/Met   Problem: Skin Integrity: Goal: Demonstration of wound healing without infection will improve Outcome: Completed/Met   Problem: Education: Goal: Ability to describe self-care measures that may prevent or decrease complications (Diabetes Survival Skills Education) will improve Outcome: Completed/Met Goal: Individualized Educational Video(s) Outcome: Completed/Met   Problem: Coping: Goal: Ability to adjust to condition or change in health will improve Outcome: Completed/Met   Problem: Fluid Volume: Goal: Ability to maintain a balanced intake and output will improve Outcome: Completed/Met   Problem: Health Behavior/Discharge Planning: Goal: Ability to identify and utilize available resources and services will improve Outcome: Completed/Met Goal: Ability to manage health-related needs will improve Outcome: Completed/Met   Problem: Metabolic: Goal: Ability to maintain appropriate glucose levels will improve Outcome: Completed/Met   Problem: Nutritional: Goal: Maintenance of adequate nutrition will improve Outcome: Completed/Met Goal: Progress toward achieving an optimal weight will improve Outcome: Completed/Met   Problem: Skin Integrity: Goal: Risk for impaired skin integrity will decrease Outcome: Completed/Met   Problem: Tissue Perfusion: Goal: Adequacy of tissue perfusion will improve Outcome: Completed/Met

## 2024-02-13 NOTE — Plan of Care (Signed)
  Problem: Education: Goal: Knowledge of General Education information will improve Description: Including pain rating scale, medication(s)/side effects and non-pharmacologic comfort measures Outcome: Completed/Met   Problem: Health Behavior/Discharge Planning: Goal: Ability to manage health-related needs will improve Outcome: Completed/Met   Problem: Clinical Measurements: Goal: Ability to maintain clinical measurements within normal limits will improve Outcome: Completed/Met Goal: Will remain free from infection Outcome: Completed/Met Goal: Diagnostic test results will improve Outcome: Completed/Met Goal: Respiratory complications will improve Outcome: Completed/Met Goal: Cardiovascular complication will be avoided Outcome: Completed/Met   Problem: Activity: Goal: Risk for activity intolerance will decrease Outcome: Completed/Met   Problem: Nutrition: Goal: Adequate nutrition will be maintained Outcome: Completed/Met   Problem: Coping: Goal: Level of anxiety will decrease Outcome: Completed/Met   Problem: Elimination: Goal: Will not experience complications related to bowel motility Outcome: Completed/Met Goal: Will not experience complications related to urinary retention Outcome: Completed/Met   Problem: Pain Managment: Goal: General experience of comfort will improve and/or be controlled Outcome: Completed/Met   Problem: Safety: Goal: Ability to remain free from injury will improve Outcome: Completed/Met   Problem: Skin Integrity: Goal: Risk for impaired skin integrity will decrease Outcome: Completed/Met   Problem: Education: Goal: Knowledge of discharge needs will improve Outcome: Completed/Met   Problem: Clinical Measurements: Goal: Postoperative complications will be avoided or minimized Outcome: Completed/Met   Problem: Respiratory: Goal: Will achieve and/or maintain a regular respiratory rate, without signs or symptoms of dyspnea Outcome:  Completed/Met   Problem: Skin Integrity: Goal: Demonstration of wound healing without infection will improve Outcome: Completed/Met   Problem: Education: Goal: Ability to describe self-care measures that may prevent or decrease complications (Diabetes Survival Skills Education) will improve Outcome: Completed/Met Goal: Individualized Educational Video(s) Outcome: Completed/Met   Problem: Coping: Goal: Ability to adjust to condition or change in health will improve Outcome: Completed/Met   Problem: Fluid Volume: Goal: Ability to maintain a balanced intake and output will improve Outcome: Completed/Met   Problem: Health Behavior/Discharge Planning: Goal: Ability to identify and utilize available resources and services will improve Outcome: Completed/Met Goal: Ability to manage health-related needs will improve Outcome: Completed/Met   Problem: Metabolic: Goal: Ability to maintain appropriate glucose levels will improve Outcome: Completed/Met   Problem: Nutritional: Goal: Maintenance of adequate nutrition will improve Outcome: Completed/Met Goal: Progress toward achieving an optimal weight will improve Outcome: Completed/Met   Problem: Skin Integrity: Goal: Risk for impaired skin integrity will decrease Outcome: Completed/Met   Problem: Tissue Perfusion: Goal: Adequacy of tissue perfusion will improve Outcome: Completed/Met

## 2024-02-13 NOTE — Discharge Instructions (Signed)
 Vascular Surgery Discharge instructions:  Do not lift anything heavy.  Do not lift anything more than a gallon of milk.  Do not drive for 2 weeks.  You may shower tomorrow on 02/14/2024.  Please shower with the old dressings to your groins and place and remove immediately after showering.  Replace bandages with Band-Aids to both incision sites of both groins.  You are being discharged on aspirin  81 mg daily, Eliquis  5 mg twice daily, and Lipitor 80 mg daily.  Please do not miss or skip any of these medications as they will alter the outcome of your procedure.  Follow-up with vein and vascular surgery as scheduled.

## 2024-02-13 NOTE — Progress Notes (Signed)
 SATURATION QUALIFICATIONS: (This note is used to comply with regulatory documentation for home oxygen)  Patient Saturations on Room Air at Rest = 88%  Patient Saturations on Room Air while Ambulating = 95%  Patient Saturations on 0 Liters of oxygen while Ambulating = 95%  Please briefly explain why patient needs home oxygen:

## 2024-02-14 ENCOUNTER — Ambulatory Visit: Admitting: Urology

## 2024-02-28 ENCOUNTER — Ambulatory Visit (INDEPENDENT_AMBULATORY_CARE_PROVIDER_SITE_OTHER)

## 2024-02-28 DIAGNOSIS — Z0000 Encounter for general adult medical examination without abnormal findings: Secondary | ICD-10-CM

## 2024-02-28 NOTE — Progress Notes (Signed)
 Subjective:   Ricardo E Frohlich Jr. is a 68 y.o. who presents for a Medicare Wellness preventive visit.  As a reminder, Annual Wellness Visits don't include a physical exam, and some assessments may be limited, especially if this visit is performed virtually. We may recommend an in-person follow-up visit with your provider if needed.  Visit Complete: Virtual I connected with  Ricardo Cervantes. on 02/28/24 by a audio enabled telemedicine application and verified that I am speaking with the correct person using two identifiers.  Patient Location: Home  Provider Location: Home Office  I discussed the limitations of evaluation and management by telemedicine. The patient expressed understanding and agreed to proceed.  Vital Signs: Because this visit was a virtual/telehealth visit, some criteria may be missing or patient reported. Any vitals not documented were not able to be obtained and vitals that have been documented are patient reported.  VideoDeclined- This patient declined Librarian, academic. Therefore the visit was completed with audio only.  Persons Participating in Visit: Patient.  AWV Questionnaire: No: Patient Medicare AWV questionnaire was not completed prior to this visit.  Cardiac Risk Factors include: advanced age (>21men, >59 women);dyslipidemia;male gender;diabetes mellitus;sedentary lifestyle;smoking/ tobacco exposure     Objective:    Today's Vitals   02/28/24 1246  PainSc: 7    There is no height or weight on file to calculate BMI.     02/28/2024   12:53 PM 02/12/2024    3:20 PM 02/06/2024    8:51 AM 12/19/2023    3:03 PM 12/18/2023    7:35 AM 11/26/2023    9:39 AM 02/28/2023   11:39 AM  Advanced Directives  Does Patient Have a Medical Advance Directive? No No No No No No No  Would patient like information on creating a medical advance directive? No - Patient declined No - Patient declined No - Patient declined No - Patient declined  No - Patient declined      Current Medications (verified) Outpatient Encounter Medications as of 02/28/2024  Medication Sig   acetaminophen  (TYLENOL ) 500 MG tablet Take 1,000 mg by mouth every 6 (six) hours as needed.   albuterol  (VENTOLIN  HFA) 108 (90 Base) MCG/ACT inhaler INHALE 2 PUFFS INTO THE LUNGS EVERY 6 HOURS AS NEEDED FOR WHEEZING OR SHORTNESS OF BREATH.   apixaban  (ELIQUIS ) 5 MG TABS tablet TAKE 1 TABLET TWICE DAILY   aspirin  EC 81 MG tablet Take 1 tablet (81 mg total) by mouth daily.   atorvastatin  (LIPITOR ) 80 MG tablet TAKE 1 TABLET EVERY DAY   Dexlansoprazole  30 MG capsule DR Take 1 capsule (30 mg total) by mouth in the morning and at bedtime.   digoxin  (LANOXIN ) 0.25 MG tablet Take 1 tablet (250 mcg total) by mouth daily.   DULoxetine  (CYMBALTA ) 60 MG capsule TAKE 1 CAPSULE EVERY DAY   empagliflozin  (JARDIANCE ) 10 MG TABS tablet TAKE 1 TABLET EVERY DAY   famotidine  (PEPCID ) 40 MG tablet Take 1 tablet (40 mg total) by mouth daily.   finasteride  (PROSCAR ) 5 MG tablet Take 1 tablet (5 mg total) by mouth daily.   Fluticasone-Umeclidin-Vilant (TRELEGY ELLIPTA ) 200-62.5-25 MCG/ACT AEPB Inhale 1 puff into the lungs daily.   Fluticasone-Umeclidin-Vilant (TRELEGY ELLIPTA ) 200-62.5-25 MCG/ACT AEPB Inhale 1 Inhalation into the lungs daily in the afternoon.   gabapentin  (NEURONTIN ) 300 MG capsule TAKE 1 CAPSULE (300MG ) AT BEDTIME. IN ADDITION TO 600MG  FOR A TOTAL OF 900MG  AT BEDTIME   gabapentin  (NEURONTIN ) 600 MG tablet TAKE 1 TABLET THREE TIMES  DAILY   hydrocortisone 1 % ointment Apply 1 Application topically as needed for itching.   metoprolol  succinate (TOPROL -XL) 25 MG 24 hr tablet TAKE 1 TABLET EVERY DAY   sildenafil  (VIAGRA ) 50 MG tablet TAKE 1 TABLET EVERY DAY AS NEEDED FOR ERECTILE DYSFUNCTION   spironolactone  (ALDACTONE ) 25 MG tablet Take 0.5 tablets (12.5 mg total) by mouth as needed.   tamsulosin  (FLOMAX ) 0.4 MG CAPS capsule Take 1 capsule (0.4 mg total) by mouth daily.    varenicline  (CHANTIX ) 1 MG tablet TAKE 1/2 TABLET DAILY FOR 3 DAYS, 1/2 TABLET TWICE DAILY FOR 4 DAYS, THEN TAKE 1 TABLET TWICE DAILY   oxyCODONE -acetaminophen  (PERCOCET/ROXICET) 5-325 MG tablet Take 1-2 tablets by mouth every 4 (four) hours as needed for moderate pain (pain score 4-6) or severe pain (pain score 7-10) ([id number]). (Patient not taking: Reported on 02/28/2024)   No facility-administered encounter medications on file as of 02/28/2024.    Allergies (verified) Patient has no known allergies.   History: Past Medical History:  Diagnosis Date   Aneurysm of descending thoracic aorta without rupture (HCC)    a.) s/p TEVAR 12/18/2023 --> 34 x 34 x 15 stent with a 40 x 40 x 20 stent and an additional 40 x 40 x 15 stent component   Aortic atherosclerosis (HCC)    Atrial fibrillation (HCC)    a.) CHA2DS2VASc = 7 (age, HFrEF, HTN, CVA x 2, vascular disease, T2DM) as of 11/28/2023; b.) rate/rhythm maintained on oral digoxin  + metoprolol  succinate; chronically anticoagulated with apixaban    Atypical mycobacterial infection of lung (HCC)    Bipolar disorder (HCC)    BPH (benign prostatic hyperplasia)    CAD (coronary artery disease)    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    Complication of anesthesia    a.) difficulty maintaining adequate sedation/anesthesia; premature emergence; b.) fentanyl  makes patient mean, grumpy, and difficult   COPD (chronic obstructive pulmonary disease) (HCC)    CVA (cerebral vascular accident) (HCC)    DDD (degenerative disc disease), cervical    a.) s/p ACDF C6-C7   DDD (degenerative disc disease), lumbosacral    Depression    Diverticulosis    Dyspnea    Erectile dysfunction    a.) on PDE5i (sildenafil ) PRN   GERD (gastroesophageal reflux disease)    Headache    Hepatitis    Hepatitis C virus infection cured after antiviral drug therapy    a.) s/p completed Tx course of ledipasvir-sofosbuvir   HFrEF (heart failure with reduced ejection fraction)  (HCC)    History of bilateral cataract extraction 03/2022   History of kidney stones 2016   History of TB (tuberculosis)    HTN (hypertension)    Hyperlipidemia    Infrarenal abdominal aortic aneurysm (AAA) without rupture (HCC)    Myocardial bridge    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   On apixaban  therapy    Pericarditis    Peripheral vascular disease (HCC)    Pneumonia    Protein calorie malnutrition (HCC)    RA (rheumatoid arthritis) (HCC)    Smoker    T2DM (type 2 diabetes mellitus) (HCC)    Thoracic scoliosis    Past Surgical History:  Procedure Laterality Date   ABDOMINAL AORTOGRAM W/LOWER EXTREMITY N/A 01/15/2017   Procedure: Abdominal Aortogram w/Lower Extremity;  Surgeon: Jama Cordella MATSU, MD;  Location: [facility];  Service: Cardiovascular;  Laterality: N/A;   ANTERIOR CERVICAL DECOMP/DISCECTOMY FUSION N/A 2004   ENDOVASCULAR REPAIR/STENT  GRAFT N/A 02/12/2024   Procedure: ENDOVASCULAR REPAIR/STENT GRAFT;  Surgeon: Jama Cordella MATSU, MD;  Location: [facility];  Service: Cardiovascular;  Laterality: N/A;   FEMORAL-POPLITEAL BYPASS GRAFT Right 06/19/2017   Procedure: BYPASS GRAFT FEMORAL-POPLITEAL ARTERY;  Surgeon: Jama Cordella MATSU, MD;  Location: [facility];  Service: Vascular;  Laterality: Right;   FEMORAL-POPLITEAL BYPASS GRAFT     had 2 on right leg , 1 on the left   fractured nose repair     as a child   liver abcess removed      LOWER EXTREMITY ANGIOGRAPHY Right 01/15/2017   Procedure: Lower Extremity Angiography;  Surgeon: Jama Cordella MATSU, MD;  Location: [facility];  Service: Cardiovascular;  Laterality: Right;   LOWER EXTREMITY ANGIOGRAPHY Right 02/13/2017   Procedure: Lower Extremity Angiography;  Surgeon: Jama Cordella MATSU, MD;  Location: [facility];  Service: Cardiovascular;  Laterality: Right;   PLACEMENT OF LUMBAR DRAIN N/A 12/18/2023   Procedure: PLACEMENT OF LUMBAR DRAIN;  Surgeon: Claudene Penne ORN, MD;  Location: [facility];  Service: Neurosurgery;  Laterality: N/A;   THORACIC AORTIC ENDOVASCULAR STENT GRAFT N/A 12/18/2023   Procedure: THORACIC AORTIC ENDOVASCULAR STENT GRAFT;  Surgeon: Jama Cordella MATSU, MD;  Location: [facility];  Service: Cardiovascular;  Laterality: N/A;   VASECTOMY N/A    Family History  Problem Relation Age of Onset   Cancer Mother        liver   Heart disease Father    Diabetes Father    Breast cancer Sister    Brain cancer Sister    Social History   Socioeconomic History   Marital status: Married    Spouse name: Not on file   Number of children: Not on file   Years of education: Not on file   Highest education level: Some college, no degree  Occupational History   Not on file  Tobacco Use   Smoking status: Every Day    Current packs/day: 3.00    Average packs/day: 3.0 packs/day for 10.5 years (31.5 ttl pk-yrs)    Types: Cigarettes    Start date: 09/03/2013   Smokeless tobacco: Never   Tobacco comments:    Smoking 4 cigarettes daily 09/20/2023    Smoking 2 -3 daily - 11/26/23  Vaping Use   Vaping status: Former  Substance and Sexual Activity   Alcohol  use: Not Currently    Alcohol /week: 2.0 standard drinks of alcohol     Types: 2 Shots of liquor per week    Comment: occasional (every 3-4 weeks)    Drug use: Yes    Types: Cocaine , Heroin, Marijuana, LSD    Comment: polysubstance approx 2 years ago   Sexual activity: Not Currently  Other Topics Concern   Not on file  Social History Narrative   W[place] live out the country, lives with son, Luisdaniel Kenton, III.   Social Drivers of Corporate investment banker Strain: Low Risk  (02/28/2024)   Overall Financial Resource Strain (CARDIA)    Difficulty of Paying Living Expenses: Not hard at all  Food Insecurity: No Food Insecurity (02/28/2024)   Hunger Vital Sign    Worried About Running Out of Food in the Last Year: Never true    Ran Out of Food in the Last Year:  Never true  Transportation Needs: No Transportation Needs (02/28/2024)   PRAPARE - Administrator, Civil Service (Medical): No    Lack of Transportation (Non-Medical): No  Physical Activity: Inactive (02/28/2024)   Exercise Vital Sign    Days of Exercise per Week: 0 days    Minutes of Exercise per Session: 0 min  Stress: No Stress Concern Present (02/28/2024)   [employer] of Occupational Health - Occupational Stress Questionnaire    Feeling of Stress: Only a little  Social Connections: Moderately Isolated (02/28/2024)   Social Connection and Isolation Panel    Frequency of Communication with Friends and Family: More than three times a week    Frequency of Social Gatherings with Friends and Family: More than three times a week    Attends Religious Services: Never    Database administrator or Organizations: No    Attends Banker Meetings: Never    Marital Status: Living with partner    Tobacco Counseling Ready to quit: Not Answered Counseling given: Not Answered Tobacco comments: Smoking 4 cigarettes daily 09/20/2023 Smoking 2 -3 daily - 11/26/23    Clinical Intake:  Pre-visit preparation completed: Yes  Pain : 0-10 Pain Score: 7  Pain Type: Chronic pain Pain Location: Foot Pain Orientation: Left Pain Radiating Towards: toes Pain Descriptors / Indicators: Aching, Discomfort, Constant Pain Onset: More than a month ago Pain Frequency: Constant     BMI - recorded: 21.3 Nutritional Status: BMI of 19-24  Normal Nutritional Risks: None Diabetes: Yes CBG done?: No Did pt. bring in CBG monitor from home?: No  Lab Results  Component Value Date   HGBA1C 6.8 (H) 01/07/2024   HGBA1C 6.3 (H) 07/09/2023   HGBA1C 5.9 (H) 04/22/2023     How often do you need to have someone help you when you read instructions, pamphlets, or other written materials from your doctor or pharmacy?: 1 - Never  Interpreter Needed?: No  Information entered by ::  JHONNIE DAS, LPN   Activities of Daily Living     02/28/2024   12:54 PM 02/12/2024    3:20 PM  In your present state of health, do you have any difficulty performing the following activities:  Hearing? 0   Vision? 0   Difficulty concentrating or making decisions? 0   Walking or climbing stairs? 1   Dressing or bathing? 0   Doing errands, shopping? 0 0  Preparing Food and eating ? N   Using the Toilet? N   In the past six months, have you accidently leaked urine? N   Do you have problems with loss of bowel control? N   Managing your Medications? N   Managing your Finances? N   Housekeeping or managing your Housekeeping? N     Patient Care Team: Antonette Angeline ORN, NP as PCP - General (Internal Medicine)  I have updated your Care Teams any recent Medical Services you may have received from other providers in the past year.     Assessment:   This is a routine wellness examination for Ricardo.  Hearing/Vision screen Hearing Screening - Comments:: HAS AIDS, DOESN'T WEAR THEM  Vision Screening - Comments:: WEARS GLASSES ALL DAY-    Goals Addressed             This Visit's Progress    DIET - EAT MORE FRUITS AND VEGETABLES         Depression Screen     02/28/2024   12:51 PM 01/07/2024    1:48 PM 01/07/2024    1:22 PM 07/09/2023    1:38 PM 02/28/2023   11:43 AM 01/11/2023   11:28 AM 12/07/2021    2:29  PM  PHQ 2/9 Scores  PHQ - 2 Score 0 0 0 2 0 0 0  PHQ- 9 Score 0 5  3 1       Fall Risk     02/28/2024   12:54 PM 01/07/2024    1:22 PM 07/09/2023    1:38 PM 02/28/2023   11:40 AM 01/11/2023   11:28 AM  Fall Risk   Falls in the past year? 1 1 1 1 1   Comment    leg gives out and lost balance   Number falls in past yr: 0 1 1 1 1   Injury with Fall? 0 1 0 1 1  Comment    busted lip and swollen foot   Risk for fall due to :    Impaired mobility;Impaired balance/gait;Medication side effect Impaired balance/gait  Follow up Falls evaluation completed;Falls prevention discussed    Falls prevention discussed;Falls evaluation completed     MEDICARE RISK AT HOME:  Medicare Risk at Home Any stairs in or around the home?: Yes If so, are there any without handrails?: Yes Home free of loose throw rugs in walkways, pet beds, electrical cords, etc?: Yes Adequate lighting in your home to reduce risk of falls?: Yes Life alert?: No Use of a cane, walker or w/c?: Yes (CANE) Grab bars in the bathroom?: No Shower chair or bench in shower?: No Elevated toilet seat or a handicapped toilet?: No  TIMED UP AND GO:  Was the test performed?  No  Cognitive Function: DECLINES TESTING- PT A & O X 3         02/28/2023   11:45 AM 12/07/2021    2:35 PM 03/12/2017    3:01 PM  6CIT Screen  What Year? 0 points 0 points 0 points  What month? 0 points 0 points 0 points  What time? 0 points 0 points 0 points  Count back from 20 0 points 0 points 0 points  Months in reverse 0 points 0 points 0 points  Repeat phrase 6 points 2 points 0 points  Total Score 6 points 2 points 0 points    Immunizations Immunization History  Administered Date(s) Administered   Influenza-Unspecified 09/16/2020   Moderna Sars-Covid-2 Vaccination 09/14/2020   PFIZER Comirnaty(Gray Top)Covid-19 Tri-Sucrose Vaccine 10/12/2020    Screening Tests Health Maintenance  Topic Date Due   OPHTHALMOLOGY EXAM  Never done   Pneumococcal Vaccine: 50+ Years (1 of 2 - PCV) Never done   Colonoscopy  Never done   COVID-19 Vaccine (3 - Mixed Product risk series) 11/09/2020   FOOT EXAM  01/24/2024   Zoster Vaccines- Shingrix (1 of 2) 04/08/2024 (Originally 10/23/1974)   DTaP/Tdap/Td (1 - Tdap) 07/08/2024 (Originally 10/23/1974)   INFLUENZA VACCINE  04/03/2024   Diabetic kidney evaluation - Urine ACR  04/21/2024   HEMOGLOBIN A1C  07/09/2024   Lung Cancer Screening  12/09/2024   Diabetic kidney evaluation - eGFR measurement  02/12/2025   Medicare Annual Wellness (AWV)  02/27/2025   Hepatitis C Screening  Completed    Hepatitis B Vaccines  Aged Out   HPV VACCINES  Aged Out   Meningococcal B Vaccine  Aged Out    Health Maintenance  Health Maintenance Due  Topic Date Due   OPHTHALMOLOGY EXAM  Never done   Pneumococcal Vaccine: 50+ Years (1 of 2 - PCV) Never done   Colonoscopy  Never done   COVID-19 Vaccine (3 - Mixed Product risk series) 11/09/2020   FOOT EXAM  01/24/2024   Health Maintenance Items  Addressed: DECLINES VACCINES; DECLINES COLONOSCOPY  Additional Screening:  Vision Screening: Recommended annual ophthalmology exams for early detection of glaucoma and other disorders of the eye. Would you like a referral to an eye doctor? No    Dental Screening: Recommended annual dental exams for proper oral hygiene  Community Resource Referral / Chronic Care Management: CRR required this visit?  No   CCM required this visit?  No   Plan:    I have personally reviewed and noted the following in the patient's chart:   Medical and social history Use of alcohol , tobacco or illicit drugs  Current medications and supplements including opioid prescriptions. Patient is currently taking opioid prescriptions. Information provided to patient regarding non-opioid alternatives. Patient advised to discuss non-opioid treatment plan with their provider. Functional ability and status Nutritional status Physical activity Advanced directives List of other physicians Hospitalizations, surgeries, and ER visits in previous 12 months Vitals Screenings to include cognitive, depression, and falls Referrals and appointments  In addition, I have reviewed and discussed with patient certain preventive protocols, quality metrics, and best practice recommendations. A written personalized care plan for preventive services as well as general preventive health recommendations were provided to patient.   Jhonnie GORMAN Das, LPN   [id number]/72/[id number]   After Visit Summary: (MyChart) Due to this being a telephonic visit, the after  visit summary with patients personalized plan was offered to patient via MyChart   Notes: Nothing significant to report at this time.

## 2024-02-28 NOTE — Patient Instructions (Addendum)
 Ricardo Cervantes , Thank you for taking time out of your busy schedule to complete your Annual Wellness Visit with me. I enjoyed our conversation and look forward to speaking with you again next year. I, as well as your care team,  appreciate your ongoing commitment to your health goals. Please review the following plan we discussed and let me know if I can assist you in the future.   Follow up Visits: Next Medicare AWV with our clinical staff:   03/12/25 @ 12:40 PM BY PHONE Have you seen your provider in the last 6 months (3 months if uncontrolled diabetes)? Yes  Clinician Recommendations:  Aim for 30 minutes of exercise or brisk walking, 6-8 glasses of water, and 5 servings of fruits and vegetables each day. TAKE CARE!      This is a list of the screening recommended for you and due dates:  Health Maintenance  Topic Date Due   Eye exam for diabetics  Never done   Pneumococcal Vaccine for age over 33 (1 of 2 - PCV) Never done   Colon Cancer Screening  Never done   COVID-19 Vaccine (3 - Mixed Product risk series) 11/09/2020   Complete foot exam   01/24/2024   Zoster (Shingles) Vaccine (1 of 2) 04/08/2024*   DTaP/Tdap/Td vaccine (1 - Tdap) 07/08/2024*   Flu Shot  04/03/2024   Yearly kidney health urinalysis for diabetes  04/21/2024   Hemoglobin A1C  07/09/2024   Screening for Lung Cancer  12/09/2024   Yearly kidney function blood test for diabetes  02/12/2025   Medicare Annual Wellness Visit  02/27/2025   Hepatitis C Screening  Completed   Hepatitis B Vaccine  Aged Out   HPV Vaccine  Aged Out   Meningitis B Vaccine  Aged Out  *Topic was postponed. The date shown is not the original due date.    Advanced directives: (Copy Requested) Please bring a copy of your health care power of attorney and living will to the office to be added to your chart at your convenience. You can mail to [facility] [address] [place], [place] [address] or email to  [email or web address] Advance Care Planning is important because it:  [x]  Makes sure you receive the medical care that is consistent with your values, goals, and preferences  [x]  It provides guidance to your family and loved ones and reduces their decisional burden about whether or not they are making the right decisions based on your wishes.  Follow the link provided in your after visit summary or read over the paperwork we have mailed to you to help you started getting your Advance Directives in place. If you need assistance in completing these, please reach out to us  so that we can help you!

## 2024-03-04 ENCOUNTER — Ambulatory Visit: Admitting: Urology

## 2024-03-04 VITALS — BP 100/56 | HR 98 | Ht 74.0 in | Wt 160.0 lb

## 2024-03-04 DIAGNOSIS — R3915 Urgency of urination: Secondary | ICD-10-CM

## 2024-03-04 DIAGNOSIS — R350 Frequency of micturition: Secondary | ICD-10-CM | POA: Diagnosis not present

## 2024-03-04 DIAGNOSIS — N401 Enlarged prostate with lower urinary tract symptoms: Secondary | ICD-10-CM | POA: Diagnosis not present

## 2024-03-04 DIAGNOSIS — R972 Elevated prostate specific antigen [PSA]: Secondary | ICD-10-CM | POA: Diagnosis not present

## 2024-03-04 LAB — URINALYSIS, COMPLETE
Bilirubin, UA: NEGATIVE
Ketones, UA: NEGATIVE
Leukocytes,UA: NEGATIVE
Nitrite, UA: NEGATIVE
Protein,UA: NEGATIVE
RBC, UA: NEGATIVE
Specific Gravity, UA: 1.015 (ref 1.005–1.030)
Urobilinogen, Ur: 1 mg/dL (ref 0.2–1.0)
pH, UA: 6 (ref 5.0–7.5)

## 2024-03-04 LAB — MICROSCOPIC EXAMINATION

## 2024-03-04 LAB — BLADDER SCAN AMB NON-IMAGING: PVR: 17 WU

## 2024-03-04 NOTE — Progress Notes (Unsigned)
 03/04/2024 9:42 AM   Ricardo Cervantes Teddie [birth year] [id number]  Referring provider: Antonette Angeline ORN, NP [address] [place],  [place] [address]  Urological history: 1. BPH with LU TS - PSA pending - tamsulosin  0.4 mg daily and finasteride  5 mg daily   2. Elevated PSA - PSA (08/2023) 9.67  3. ED - sildenafil  50 mg, on-demand-dosing - penile ring  Chief Complaint  Patient presents with   Elevated PSA   HPI: Ricardo Cervantes. is a 68 y.o. man who presents today for 6 months follow up.    Previous records reviewed.   IPSS score: 21/4  PVR: 17 mL     Two weeks ago, he started to experience urge incontinence.    He states when he has to go he has to go.    The urine will come out freely when he urinates while sitting.  When he stands up to void it just drips, drips drips denies any dysuria, hematuria or suprapubic pain.   Currently taking: tamsulosin  0.4 mg daily and finasteride  5 mg daily.  Denies any recent fevers, chills, nausea or vomiting.  Serum creatinine (02/2024) 0.68, e GFR  >60   Hbg A1c (01/2024) 6.8  UA 3+ glucose and few bacteria  He is smoking and uses illicit drugs.    He states he also cannot orgasm.  He is having satisfactory intercourse taking 50 mg of Viagra , he cannot tolerate the 100 mg Viagra  as it makes him feel bad.   IPSS     Row Name 03/04/24 1519         International Prostate Symptom Score   How often have you had the sensation of not emptying your bladder? More than half the time     How often have you had to urinate less than every two hours? More than half the time     How often have you found you stopped and started again several times when you urinated? More than half the time     How often have you found it difficult to postpone urination? Almost always     How often have you had a weak urinary stream? About half the time     How often have you had to strain to start urination? Less than 1 in 5 times     Total IPSS Score 21        Quality of Life due to urinary symptoms   If you were to spend the rest of your life with your urinary condition just the way it is now how would you feel about that? Unhappy        Score:  1-7 Mild 8-19 Moderate 20-35 Severe   PMH: Past Medical History:  Diagnosis Date   Aneurysm of descending thoracic aorta without rupture (HCC)    a.) s/p TEVAR 12/18/2023 --> 34 x 34 x 15 stent with a 40 x 40 x 20 stent and an additional 40 x 40 x 15 stent component   Aortic atherosclerosis (HCC)    Atrial fibrillation (HCC)    a.) CHA2DS2VASc = 7 (age, HFrEF, HTN, CVA x 2, vascular disease, T2DM) as of 11/28/2023; b.) rate/rhythm maintained on oral digoxin  + metoprolol  succinate; chronically anticoagulated with apixaban    Atypical mycobacterial infection of lung (HCC)    Bipolar disorder (HCC)    BPH (benign prostatic hyperplasia)    CAD (coronary artery disease)    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  Complication of anesthesia    a.) difficulty maintaining adequate sedation/anesthesia; premature emergence; b.) fentanyl  makes patient mean, grumpy, and difficult   COPD (chronic obstructive pulmonary disease) (HCC)    CVA (cerebral vascular accident) (HCC)    DDD (degenerative disc disease), cervical    a.) s/p ACDF C6-C7   DDD (degenerative disc disease), lumbosacral    Depression    Diverticulosis    Dyspnea    Erectile dysfunction    a.) on PDE5i (sildenafil ) PRN   GERD (gastroesophageal reflux disease)    Headache    Hepatitis    Hepatitis C virus infection cured after antiviral drug therapy    a.) s/p completed Tx course of ledipasvir-sofosbuvir   HFrEF (heart failure with reduced ejection fraction) (HCC)    History of bilateral cataract extraction 03/2022   History of kidney stones 2016   History of TB (tuberculosis)    HTN (hypertension)    Hyperlipidemia    Infrarenal abdominal aortic aneurysm (AAA) without rupture (HCC)    Myocardial bridge    a.) OM3  myocardial muscle bridge noted on LHC in 2022   On apixaban  therapy    Pericarditis    Peripheral vascular disease (HCC)    Pneumonia    Protein calorie malnutrition (HCC)    RA (rheumatoid arthritis) (HCC)    Smoker    T2DM (type 2 diabetes mellitus) (HCC)    Thoracic scoliosis     Surgical History: Past Surgical History:  Procedure Laterality Date   ABDOMINAL AORTOGRAM W/LOWER EXTREMITY N/A 01/15/2017   Procedure: Abdominal Aortogram w/Lower Extremity;  Surgeon: Jama Cordella MATSU, MD;  Location: [facility];  Service: Cardiovascular;  Laterality: N/A;   ANTERIOR CERVICAL DECOMP/DISCECTOMY FUSION N/A 2004   ENDOVASCULAR REPAIR/STENT GRAFT N/A 02/12/2024   Procedure: ENDOVASCULAR REPAIR/STENT GRAFT;  Surgeon: Jama Cordella MATSU, MD;  Location: [facility];  Service: Cardiovascular;  Laterality: N/A;   FEMORAL-POPLITEAL BYPASS GRAFT Right 06/19/2017   Procedure: BYPASS GRAFT FEMORAL-POPLITEAL ARTERY;  Surgeon: Jama Cordella MATSU, MD;  Location: [facility];  Service: Vascular;  Laterality: Right;   FEMORAL-POPLITEAL BYPASS GRAFT     had 2 on right leg , 1 on the left   fractured nose repair     as a child   liver abcess removed      LOWER EXTREMITY ANGIOGRAPHY Right 01/15/2017   Procedure: Lower Extremity Angiography;  Surgeon: Jama Cordella MATSU, MD;  Location: [facility];  Service: Cardiovascular;  Laterality: Right;   LOWER EXTREMITY ANGIOGRAPHY Right 02/13/2017   Procedure: Lower Extremity Angiography;  Surgeon: Jama Cordella MATSU, MD;  Location: [facility];  Service: Cardiovascular;  Laterality: Right;   PLACEMENT OF LUMBAR DRAIN N/A 12/18/2023   Procedure: PLACEMENT OF LUMBAR DRAIN;  Surgeon: Claudene Penne ORN, MD;  Location: [facility];  Service: Neurosurgery;  Laterality: N/A;   THORACIC AORTIC ENDOVASCULAR STENT GRAFT N/A 12/18/2023   Procedure: THORACIC AORTIC ENDOVASCULAR STENT GRAFT;  Surgeon: Jama Cordella MATSU, MD;  Location:  [facility];  Service: Cardiovascular;  Laterality: N/A;   VASECTOMY N/A     Home Medications:  Allergies as of 03/04/2024   No Known Allergies      Medication List        Accurate as of March 04, 2024 11:59 PM. If you have any questions, ask your nurse or doctor.          acetaminophen  500 MG tablet Commonly known as: TYLENOL  Take 1,000 mg  by mouth every 6 (six) hours as needed.   albuterol  108 (90 Base) MCG/ACT inhaler Commonly known as: VENTOLIN  HFA INHALE 2 PUFFS INTO THE LUNGS EVERY 6 HOURS AS NEEDED FOR WHEEZING OR SHORTNESS OF BREATH.   aspirin  EC 81 MG tablet Take 1 tablet (81 mg total) by mouth daily.   atorvastatin  80 MG tablet Commonly known as: LIPITOR  TAKE 1 TABLET EVERY DAY   Dexlansoprazole  30 MG capsule DR Take 1 capsule (30 mg total) by mouth in the morning and at bedtime.   digoxin  0.25 MG tablet Commonly known as: LANOXIN  Take 1 tablet (250 mcg total) by mouth daily.   DULoxetine  60 MG capsule Commonly known as: CYMBALTA  TAKE 1 CAPSULE EVERY DAY   Eliquis  5 MG Tabs tablet Generic drug: apixaban  TAKE 1 TABLET TWICE DAILY   famotidine  40 MG tablet Commonly known as: PEPCID  Take 1 tablet (40 mg total) by mouth daily.   finasteride  5 MG tablet Commonly known as: PROSCAR  Take 1 tablet (5 mg total) by mouth daily.   gabapentin  600 MG tablet Commonly known as: NEURONTIN  TAKE 1 TABLET THREE TIMES DAILY   gabapentin  300 MG capsule Commonly known as: NEURONTIN  TAKE 1 CAPSULE (300MG ) AT BEDTIME. IN ADDITION TO 600MG  FOR A TOTAL OF 900MG  AT BEDTIME   hydrocortisone 1 % ointment Apply 1 Application topically as needed for itching.   Jardiance  10 MG Tabs tablet Generic drug: empagliflozin  TAKE 1 TABLET EVERY DAY   metoprolol  succinate 25 MG 24 hr tablet Commonly known as: TOPROL -XL TAKE 1 TABLET EVERY DAY   oxyCODONE -acetaminophen  5-325 MG tablet Commonly known as: PERCOCET/ROXICET Take 1-2 tablets by mouth every 4 (four) hours  as needed for moderate pain (pain score 4-6) or severe pain (pain score 7-10) ([id number]).   sildenafil  50 MG tablet Commonly known as: VIAGRA  TAKE 1 TABLET EVERY DAY AS NEEDED FOR ERECTILE DYSFUNCTION   spironolactone  25 MG tablet Commonly known as: ALDACTONE  Take 0.5 tablets (12.5 mg total) by mouth as needed.   tamsulosin  0.4 MG Caps capsule Commonly known as: FLOMAX  Take 1 capsule (0.4 mg total) by mouth daily.   Trelegy Ellipta  200-62.5-25 MCG/ACT Aepb Generic drug: Fluticasone-Umeclidin-Vilant Inhale 1 puff into the lungs daily.   Trelegy Ellipta  200-62.5-25 MCG/ACT Aepb Generic drug: Fluticasone-Umeclidin-Vilant Inhale 1 Inhalation into the lungs daily in the afternoon.   varenicline  1 MG tablet Commonly known as: CHANTIX  TAKE 1/2 TABLET DAILY FOR 3 DAYS, 1/2 TABLET TWICE DAILY FOR 4 DAYS, THEN TAKE 1 TABLET TWICE DAILY        Allergies: No Known Allergies  Family History: Family History  Problem Relation Age of Onset   Cancer Mother        liver   Heart disease Father    Diabetes Father    Breast cancer Sister    Brain cancer Sister     Social History: See HPI for pertinent social history  ROS: Pertinent ROS in HPI  Physical Exam: BP (!) 100/56   Pulse 98   Ht 6' 2 (1.88 m)   Wt 160 lb (72.6 kg)   BMI 20.54 kg/m   Constitutional:  Well nourished. Alert and oriented, No acute distress. HEENT: [place] AT, moist mucus membranes.  Trachea midline Cardiovascular: No clubbing, cyanosis, or edema. Respiratory: Normal respiratory effort, no increased work of breathing. GU: No CVA tenderness.  No bladder fullness or masses.  Patient with circumcised phallus.   Urethral meatus is patent.  No penile discharge. No penile lesions or rashes. Scrotum without  lesions, cysts, rashes and/or edema.  Testicles are located scrotally bilaterally. No masses are appreciated in the testicles. Left and right epididymis are normal. Rectal: Patient with  normal sphincter tone.  Anus and perineum without scarring or rashes. No rectal masses are appreciated. Prostate is approximately 50 grams, could not palpate the entire prostate, no nodules are appreciated. Seminal vesicles could not be palpated.  Neurologic: Grossly intact, no focal deficits, moving all 4 extremities. Psychiatric: Normal mood and affect.  Laboratory Data: See EPIC and HPI  I have reviewed the labs.   Pertinent Imaging: Component     Latest Ref Rng 03/04/2024  PVR     WU 17.0     Assessment & Plan:    1. Elevated PSA (Primary) - PSA pending  2. Benign prostatic hyperplasia with urinary frequency - Bladder Scan (Post Void Residual) in office, demonstrates adequate bladder emptying  3. Urge incontinence - UA was not suspicious for infection, but I will send for culture to rule out indolent infection - He will continue tamsulosin  0.4 mg daily and finasteride  5 mg daily - If PSA is normal and urine culture is negative, we may need to consider an OAB agent   Return for Pending PSA and urine culture results .  These notes generated with voice recognition software. I apologize for typographical errors.  CLOTILDA HELON RIGGERS  [facility] [address]  [address] [place], [place] [address] [phone]

## 2024-03-05 ENCOUNTER — Ambulatory Visit: Payer: Self-pay | Admitting: Urology

## 2024-03-05 LAB — PSA: Prostate Specific Ag, Serum: 2.7 ng/mL (ref 0.0–4.0)

## 2024-03-07 ENCOUNTER — Other Ambulatory Visit: Payer: Self-pay | Admitting: Internal Medicine

## 2024-03-08 ENCOUNTER — Other Ambulatory Visit: Payer: Self-pay | Admitting: Internal Medicine

## 2024-03-08 LAB — CULTURE, URINE COMPREHENSIVE

## 2024-03-09 ENCOUNTER — Encounter: Payer: Self-pay | Admitting: Urology

## 2024-03-10 ENCOUNTER — Ambulatory Visit (INDEPENDENT_AMBULATORY_CARE_PROVIDER_SITE_OTHER): Admitting: Pulmonary Disease

## 2024-03-10 ENCOUNTER — Encounter: Payer: Self-pay | Admitting: Pulmonary Disease

## 2024-03-10 VITALS — BP 108/78 | HR 79 | Temp 97.8°F | Ht 74.0 in | Wt 166.4 lb

## 2024-03-10 DIAGNOSIS — A310 Pulmonary mycobacterial infection: Secondary | ICD-10-CM | POA: Diagnosis not present

## 2024-03-10 DIAGNOSIS — J449 Chronic obstructive pulmonary disease, unspecified: Secondary | ICD-10-CM | POA: Diagnosis not present

## 2024-03-10 DIAGNOSIS — J984 Other disorders of lung: Secondary | ICD-10-CM

## 2024-03-10 DIAGNOSIS — F17210 Nicotine dependence, cigarettes, uncomplicated: Secondary | ICD-10-CM | POA: Diagnosis not present

## 2024-03-10 NOTE — Patient Instructions (Signed)
 Currently the lesion in your lung is too large for SBRT or targeted radiation.  You are currently on a blood thinner for your stents.  This is necessary so your stents do not clot due to your very large aneurysms.  We will see you in follow-up after you return to the country to reevaluate your fitness to withstand bronchoscopy under general anesthesia.

## 2024-03-10 NOTE — Telephone Encounter (Signed)
 Requested Prescriptions  Pending Prescriptions Disp Refills   famotidine  (PEPCID ) 40 MG tablet [Pharmacy Med Name: Famotidine  Oral Tablet 40 MG] 90 tablet 3    Sig: TAKE 1 TABLET EVERY DAY     Gastroenterology:  H2 Antagonists Passed - 03/10/2024  3:27 PM      Passed - Valid encounter within last 12 months    Recent Outpatient Visits           2 months ago Type 2 diabetes mellitus treated without insulin  [facility])   [place] [facility] [place], Angeline ORN, [place]

## 2024-03-10 NOTE — Progress Notes (Signed)
 Subjective:    Patient ID: Ricardo Cervantes., male    DOB: 1955/11/08, 68 y.o.   MRN: [id number]  Patient Care Team: Antonette Angeline ORN, NP as PCP - General (Internal Medicine)  Chief Complaint  Patient presents with   Follow-up    BACKGROUND/INTERVAL::68 year old current smoker presents for evaluation of abnormalities noted on lung cancer screening CT.  Patient has a history of ischemic cardiomyopathy, stage III COPD, prior TB treated, atypical Mycobacterium infection and severe peripheral vascular disease.  Was initially evaluated here on 28 Jan 2024.   HPI Patient is a 68 year old current smoker with COPD and a cavitary lesion in the left upper lobe. We have been unable to work up his cavitary lesion due patient not being well compensated initially from the COPD standpoint and the presence of a 6-centimeter descending aortic aneurysm that required vascular intervention. The patient is currently on Eliquis , and multiple stents have been placed. He is adamant about wanting to go out of the country at the end of the month and will be gone for 3 months. He has been doing well with Trelegy for his COPD.  Most recent imaging was a CT angio chest performed after stent placement for his 6.5 cm aneurysm in the descending aorta which was at high risk for rupture.  He also underwent stents for the infrarenal abdominal aortic aneurysms.  He has been reluctant to undergo repeat bronchoscopy.  Previously had had bronchoscopy at [facility] in 2022 after a 1 month admission where the right cavitary lung lesion was evaluated and managed.  During that stay he had bronchoscopy, thoracentesis and fine-needle aspirate of the right upper lobe cavitary lesion.  Transbronchial biopsy showed a small nonnecrotizing granuloma.  BAL was positive for AFB smear and it has been postulated that this is due to atypical mycobacteria.  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  He has had difficulties with treatment in the past for this organism due to intolerance of the medications (see [facility] narrative 15 December 2020 on Care Everywhere).  He has severe emphysematous changes on CT. alpha-1 antitrypsin levels have been normal in the past.  He has been classified as severe COPD however we have not been able to perform PFTs during this stay he has not here in the [place] .  Patient is adamant that he would like to go to [place] to visit with his wife.  He does not wish any further interventions with regards to his cavitary lesion at present.  He will consider further workup on return from his trip abroad.  We will see him for follow-up after his return from [place] which he states will be approximately 3 months.  Review of Systems A 10 point review of systems was performed and it is as noted above otherwise negative.   Patient Active Problem List   Diagnosis Date Noted   Thoracic aortic aneurysm without rupture (HCC) 12/18/2023   AAA (abdominal aortic aneurysm) without rupture (HCC) 09/29/2023   Depression 04/12/2023   BPH (benign prostatic hyperplasia) 12/14/2021   GERD (gastroesophageal reflux disease) 07/31/2021   DM (diabetes mellitus), type 2 with complications (HCC) 07/31/2021   Stage 3 severe COPD by GOLD classification (HCC) 10/18/2020   HFrEF (heart failure with reduced ejection fraction) (HCC) 10/18/2020   History of stroke with residual effects 10/18/2020   Atrial fibrillation (HCC) 09/04/2020   Hyperlipidemia associated with type  2 diabetes mellitus (HCC) 07/04/2017   Atherosclerosis of native arteries of extremity with intermittent claudication (HCC) 06/19/2017   Erectile dysfunction 12/23/2015    Social History   Tobacco Use   Smoking status: Every Day    Current packs/day: 3.00    Average packs/day: 3.0 packs/day for 10.5 years (31.6 ttl pk-yrs)    Types:  Cigarettes    Start date: 09/03/2013   Smokeless tobacco: Never   Tobacco comments:    Smoking 4 cigarettes daily 09/20/2023    Smoking 2 -3 daily - 11/26/23  Substance Use Topics   Alcohol  use: Not Currently    Alcohol /week: 2.0 standard drinks of alcohol     Types: 2 Shots of liquor per week    Comment: occasional (every 3-4 weeks)     No Known Allergies  Current Meds  Medication Sig   acetaminophen  (TYLENOL ) 500 MG tablet Take 1,000 mg by mouth every 6 (six) hours as needed.   albuterol  (VENTOLIN  HFA) 108 (90 Base) MCG/ACT inhaler INHALE 2 PUFFS INTO THE LUNGS EVERY 6 HOURS AS NEEDED FOR WHEEZING OR SHORTNESS OF BREATH.   apixaban  (ELIQUIS ) 5 MG TABS tablet TAKE 1 TABLET TWICE DAILY   aspirin  EC 81 MG tablet Take 1 tablet (81 mg total) by mouth daily.   atorvastatin  (LIPITOR ) 80 MG tablet TAKE 1 TABLET EVERY DAY   Dexlansoprazole  30 MG capsule DR Take 1 capsule (30 mg total) by mouth in the morning and at bedtime.   digoxin  (LANOXIN ) 0.25 MG tablet Take 1 tablet (250 mcg total) by mouth daily.   DULoxetine  (CYMBALTA ) 60 MG capsule TAKE 1 CAPSULE EVERY DAY   empagliflozin  (JARDIANCE ) 10 MG TABS tablet TAKE 1 TABLET EVERY DAY   finasteride  (PROSCAR ) 5 MG tablet Take 1 tablet (5 mg total) by mouth daily.   Fluticasone-Umeclidin-Vilant (TRELEGY ELLIPTA ) 200-62.5-25 MCG/ACT AEPB Inhale 1 puff into the lungs daily.   gabapentin  (NEURONTIN ) 300 MG capsule TAKE 1 CAPSULE (300MG ) AT BEDTIME. IN ADDITION TO 600MG  FOR A TOTAL OF 900MG  AT BEDTIME   gabapentin  (NEURONTIN ) 600 MG tablet TAKE 1 TABLET THREE TIMES DAILY   hydrocortisone 1 % ointment Apply 1 Application topically as needed for itching.   metoprolol  succinate (TOPROL -XL) 25 MG 24 hr tablet TAKE 1 TABLET EVERY DAY   sildenafil  (VIAGRA ) 50 MG tablet TAKE 1 TABLET EVERY DAY AS NEEDED FOR ERECTILE DYSFUNCTION   spironolactone  (ALDACTONE ) 25 MG tablet Take 0.5 tablets (12.5 mg total) by mouth as needed.   tamsulosin  (FLOMAX ) 0.4 MG CAPS  capsule Take 1 capsule (0.4 mg total) by mouth daily.   varenicline  (CHANTIX ) 1 MG tablet TAKE 1/2 TABLET DAILY FOR 3 DAYS, 1/2 TABLET TWICE DAILY FOR 4 DAYS, THEN TAKE 1 TABLET TWICE DAILY   [DISCONTINUED] famotidine  (PEPCID ) 40 MG tablet Take 1 tablet (40 mg total) by mouth daily.    Immunization History  Administered Date(s) Administered   Influenza-Unspecified 09/16/2020   Moderna Sars-Covid-2 Vaccination 09/14/2020   PFIZER Comirnaty(Gray Top)Covid-19 Tri-Sucrose Vaccine 10/12/2020        Objective:     BP 108/78 (BP Location: Left Arm, Patient Position: Sitting, Cuff Size: Normal)   Pulse 79   Temp 97.8 F (36.6 C) (Oral)   Ht 6' 2 (1.88 m)   Wt 166 lb 6.4 oz (75.5 kg)   SpO2 95%   BMI 21.36 kg/m   SpO2: 95 %  GENERAL: Thin, well-developed gentleman in no acute distress.  He ambulates with the assistance of a cane. HEAD: Normocephalic, atraumatic.  EYES: Pupils equal, round, reactive to light.  No scleral icterus.  MOUTH: Poor dentition, upper dentures, partial lower.  Oral mucosa moist.  No thrush NECK: Supple. No thyromegaly. Trachea midline. No JVD.  No adenopathy. PULMONARY: Good air entry bilaterally.  Coarse, few scattered rhonchi. CARDIOVASCULAR: S1 and S2. Regular rate and rhythm.  No rubs, murmurs or gallops heard. ABDOMEN: Benign. MUSCULOSKELETAL: No joint deformity, no clubbing, trace edema lower extremities.  NEUROLOGIC: No overt focal deficit, gait supported by cane.  Speech is fluent. SKIN: Intact,warm,dry.  Cool, dusky feet (chronic). PSYCH: Flat affect and behavior normal.   Assessment & Plan:     ICD-10-CM   1. Stage 3 severe COPD by GOLD classification (HCC)  J44.9    Continue Trelegy Ellipta  Continuous and albuterol  Counseled regards to smoking cessation    2. 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 Tobacco dependence due to cigarettes  F17.210    Patient counseled regards to discontinuation of smoking      Discussion: Patient is adamant that he is to return to [place].  States that he will be gone for approximately 3 months.  Does not wish any further workup until after return.  Will see the patient in follow-up after his [place] the [place] .   Advised if symptoms do not improve or worsen, to please contact office for sooner follow up or seek emergency care.    I spent 30 minutes of dedicated to the care of this patient on the date of this encounter to include pre-visit review of records, face-to-face time with the patient discussing conditions above, post visit ordering of testing, clinical documentation with the electronic health record, making appropriate referrals as documented, and communicating necessary findings to members of the patients care team.     C. Leita Sanders, MD Advanced Bronchoscopy P[facility]    *This note was generated using voice recognition software/Dragon and/or AI transcription program.  Despite best efforts to proofread, errors can occur which can change the meaning. Any transcriptional errors that result from this process are unintentional and may not be fully corrected at the time of dictation.

## 2024-03-10 NOTE — Telephone Encounter (Signed)
 Requested Prescriptions  Refused Prescriptions Disp Refills   gabapentin  (NEURONTIN ) 300 MG capsule [Pharmacy Med Name: Gabapentin  Oral Capsule 300 MG] 90 capsule 3    Sig: TAKE 1 CAPSULE AT BEDTIME (IN ADDITION TO 600MG  FOR A TOTAL OF 900MG  AT BEDTIME)     Neurology: Anticonvulsants - gabapentin  Passed - 03/10/2024  2:16 PM      Passed - Cr in normal range and within 360 days    Creat  Date Value Ref Range Status  07/09/2023 0.82 0.70 - 1.35 mg/dL Final   Creatinine, Ser  Date Value Ref Range Status  02/13/2024 0.68 0.61 - 1.24 mg/dL Final   Creatinine, Urine  Date Value Ref Range Status  04/22/2023 86 20 - 320 mg/dL Final         Passed - Completed PHQ-2 or PHQ-9 in the last 360 days      Passed - Valid encounter within last 12 months    Recent Outpatient Visits           2 months ago Type 2 diabetes mellitus treated without insulin  [facility])   [place] [facility] [place], Angeline ORN, NP

## 2024-03-13 ENCOUNTER — Encounter: Payer: Self-pay | Admitting: Pulmonary Disease

## 2024-03-15 ENCOUNTER — Other Ambulatory Visit: Payer: Self-pay | Admitting: Internal Medicine

## 2024-03-15 DIAGNOSIS — H60541 Acute eczematoid otitis externa, right ear: Secondary | ICD-10-CM

## 2024-03-16 ENCOUNTER — Other Ambulatory Visit (INDEPENDENT_AMBULATORY_CARE_PROVIDER_SITE_OTHER): Payer: Self-pay | Admitting: Vascular Surgery

## 2024-03-16 DIAGNOSIS — I714 Abdominal aortic aneurysm, without rupture, unspecified: Secondary | ICD-10-CM

## 2024-03-17 NOTE — Telephone Encounter (Signed)
 Labs are in date.  Requested Prescriptions  Pending Prescriptions Disp Refills   JARDIANCE  10 MG TABS tablet [Pharmacy Med Name: Jardiance  Oral Tablet 10 MG] 90 tablet 3    Sig: TAKE 1 TABLET EVERY DAY     Endocrinology:  Diabetes - SGLT2 Inhibitors Passed - 03/17/2024  1:47 PM      Passed - Cr in normal range and within 360 days    Creat  Date Value Ref Range Status  07/09/2023 0.82 0.70 - 1.35 mg/dL Final   Creatinine, Ser  Date Value Ref Range Status  02/13/2024 0.68 0.61 - 1.24 mg/dL Final   Creatinine, Urine  Date Value Ref Range Status  04/22/2023 86 20 - 320 mg/dL Final         Passed - HBA1C is between 0 and 7.9 and within 180 days    Hgb A1c MFr Bld  Date Value Ref Range Status  01/07/2024 6.8 (H) <5.7 % Final    Comment:    For someone without known diabetes, a hemoglobin A1c value of 6.5% or greater indicates that they may have  diabetes and this should be confirmed with a follow-up  test. . For someone with known diabetes, a value <7% indicates  that their diabetes is well controlled and a value  greater than or equal to 7% indicates suboptimal  control. A1c targets should be individualized based on  duration of diabetes, age, comorbid conditions, and  other considerations. . Currently, no consensus exists regarding use of hemoglobin A1c for diagnosis of diabetes for children. .          Passed - eGFR in normal range and within 360 days    GFR, Est African American  Date Value Ref Range Status  12/18/2017 109 > OR = 60 mL/min/1.23m2 Final   GFR, Est Non African American  Date Value Ref Range Status  12/18/2017 94 > OR = 60 mL/min/1.27m2 Final   GFR, Estimated  Date Value Ref Range Status  02/13/2024 >60 >60 mL/min Final    Comment:    (NOTE) Calculated using the CKD-EPI Creatinine Equation (2021)    eGFR  Date Value Ref Range Status  07/09/2023 97 > OR = 60 mL/min/1.84m2 Final  11/14/2020 105 >59 mL/min/1.73 Final         Passed - Valid  encounter within last 6 months    Recent Outpatient Visits           2 months ago Type 2 diabetes mellitus treated without insulin  [facility])   [place] [facility] [place], [place] W, NP               metoprolol  succinate (TOPROL -XL) 25 MG 24 hr tablet [Pharmacy Med Name: Metoprolol  Succinate ER Oral Tablet Extended Release 24 Hour 25 MG] 90 tablet 3    Sig: TAKE 1 TABLET EVERY DAY     Cardiovascular:  Beta Blockers Passed - 03/17/2024  1:47 PM      Passed - Last BP in normal range    BP Readings from Last 1 Encounters:  03/10/24 108/78         Passed - Last Heart Rate in normal range    Pulse Readings from Last 1 Encounters:  03/10/24 79         Passed - Valid encounter within last 6 months    Recent Outpatient Visits           2 months ago Type 2 diabetes mellitus treated without insulin  (HCC)  [place] [facility] [place], Angeline ORN, NP               ELIQUIS  5 MG TABS tablet [Pharmacy Med Name: Eliquis  Oral Tablet 5 MG] 180 tablet 3    Sig: TAKE 1 TABLET TWICE DAILY     Hematology:  Anticoagulants - apixaban  Failed - 03/17/2024  1:47 PM      Failed - HGB in normal range and within 360 days    Hemoglobin  Date Value Ref Range Status  02/13/2024 11.9 (L) 13.0 - 17.0 g/dL Final  96/85/7977 87.0 (L) 13.0 - 17.7 g/dL Final         Failed - HCT in normal range and within 360 days    HCT  Date Value Ref Range Status  02/13/2024 38.0 (L) 39.0 - 52.0 % Final   Hematocrit  Date Value Ref Range Status  11/14/2020 40.0 37.5 - 51.0 % Final         Passed - PLT in normal range and within 360 days    Platelets  Date Value Ref Range Status  02/13/2024 185 150 - 400 K/uL Final  11/14/2020 287 150 - 450 x10E3/uL Final         Passed - Cr in normal range and within 360 days    Creat  Date Value Ref Range Status  07/09/2023 0.82 0.70 - 1.35 mg/dL Final   Creatinine, Ser  Date Value Ref Range Status  02/13/2024 0.68 0.61 - 1.24  mg/dL Final   Creatinine, Urine  Date Value Ref Range Status  04/22/2023 86 20 - 320 mg/dL Final         Passed - AST in normal range and within 360 days    AST  Date Value Ref Range Status  07/09/2023 20 10 - 35 U/L Final         Passed - ALT in normal range and within 360 days    ALT  Date Value Ref Range Status  07/09/2023 27 9 - 46 U/L Final         Passed - Valid encounter within last 12 months    Recent Outpatient Visits           2 months ago Type 2 diabetes mellitus treated without insulin  [facility])   [facility] [place], Angeline ORN, NP

## 2024-03-18 ENCOUNTER — Other Ambulatory Visit: Payer: Self-pay | Admitting: Internal Medicine

## 2024-03-19 NOTE — Telephone Encounter (Signed)
 Requested medication (s) are due for refill today:   Looks like it's being requested too soon however on 02/12/2024 he was in the hospital and it was either discontinued or reordered.   Unable to determine.  Takes it with another dose of gabapentin  to equal total dose needed.  Requested medication (s) are on the active medication list:   Yes  Future visit scheduled:   No.   Last appt on 02/07/2024 cancelled by provider.       LOV 01/07/2024   Last ordered: 11/25/2023 #90,   Unable to refill because not sure of actual refill date from hospitalization.   Provider to review   Requested Prescriptions  Pending Prescriptions Disp Refills   gabapentin  (NEURONTIN ) 300 MG capsule [Pharmacy Med Name: GABAPENTIN  300 MG Oral Capsule] 90 capsule 3    Sig: TAKE 1 CAPSULE AT BEDTIME (IN ADDITION TO 600MG  FOR A TOTAL OF 900MG  AT BEDTIME)     Neurology: Anticonvulsants - gabapentin  Passed - 03/19/2024  1:46 PM      Passed - Cr in normal range and within 360 days    Creat  Date Value Ref Range Status  07/09/2023 0.82 0.70 - 1.35 mg/dL Final   Creatinine, Ser  Date Value Ref Range Status  02/13/2024 0.68 0.61 - 1.24 mg/dL Final   Creatinine, Urine  Date Value Ref Range Status  04/22/2023 86 20 - 320 mg/dL Final         Passed - Completed PHQ-2 or PHQ-9 in the last 360 days      Passed - Valid encounter within last 12 months    Recent Outpatient Visits           2 months ago Type 2 diabetes mellitus treated without insulin  [facility])   [place] [facility] [place], Angeline ORN, NP

## 2024-03-21 NOTE — Progress Notes (Unsigned)
 Patient ID: [name]., male   DOB: 1955-09-16, 68 y.o.   MRN: [id number]  Follow-up EVAR  HPI Demoni Gergen. is a 68 y.o. male.    February 12, 2024: Placement of a C3 32 x 14 x 14 Gore Excluder Endoprosthesis main body with a 20 x 10 ipsilateral extender limb and a 20 x 12 contralateral limb  December 18, 2023 2.  Placement of a Gore TAG conformable thoracic stent graft in the descending thoracic aorta, using a 34 x 34 x 15 stent with a 40 x 40 x 20 stent and an additional 40 x 40 x 15 stent components  No new complaints   Past Medical History:  Diagnosis Date   Aneurysm of descending thoracic aorta without rupture (HCC)    a.) s/p TEVAR 12/18/2023 --> 34 x 34 x 15 stent with a 40 x 40 x 20 stent and an additional 40 x 40 x 15 stent component   Aortic atherosclerosis (HCC)    Atrial fibrillation (HCC)    a.) CHA2DS2VASc = 7 (age, HFrEF, HTN, CVA x 2, vascular disease, T2DM) as of 11/28/2023; b.) rate/rhythm maintained on oral digoxin  + metoprolol  succinate; chronically anticoagulated with apixaban    Atypical mycobacterial infection of lung (HCC)    Bipolar disorder (HCC)    BPH (benign prostatic hyperplasia)    CAD (coronary artery disease)    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    Complication of anesthesia    a.) difficulty maintaining adequate sedation/anesthesia; premature emergence; b.) fentanyl  makes patient mean, grumpy, and difficult   COPD (chronic obstructive pulmonary disease) (HCC)    CVA (cerebral vascular accident) (HCC)    DDD (degenerative disc disease), cervical    a.) s/p ACDF C6-C7   DDD (degenerative disc disease), lumbosacral    Depression    Diverticulosis    Dyspnea    Erectile dysfunction    a.) on PDE5i (sildenafil ) PRN   GERD (gastroesophageal reflux disease)    Headache    Hepatitis    Hepatitis C virus infection cured after antiviral drug therapy    a.) s/p completed Tx course of ledipasvir-sofosbuvir   HFrEF (heart failure  with reduced ejection fraction) (HCC)    History of bilateral cataract extraction 03/2022   History of kidney stones 2016   History of TB (tuberculosis)    HTN (hypertension)    Hyperlipidemia    Infrarenal abdominal aortic aneurysm (AAA) without rupture (HCC)    Myocardial bridge    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   On apixaban  therapy    Pericarditis    Peripheral vascular disease (HCC)    Pneumonia    Protein calorie malnutrition (HCC)    RA (rheumatoid arthritis) (HCC)    Smoker    T2DM (type 2 diabetes mellitus) (HCC)    Thoracic scoliosis     Past Surgical History:  Procedure Laterality Date   ABDOMINAL AORTOGRAM W/LOWER EXTREMITY N/A 01/15/2017   Procedure: Abdominal Aortogram w/Lower Extremity;  Surgeon: Jama Cordella MATSU, MD;  Location: [facility] INVASIVE CV LAB;  Service: Cardiovascular;  Laterality: N/A;   ANTERIOR CERVICAL DECOMP/DISCECTOMY FUSION N/A 2004   ENDOVASCULAR REPAIR/STENT GRAFT N/A 02/12/2024   Procedure: ENDOVASCULAR REPAIR/STENT GRAFT;  Surgeon: Jama Cordella MATSU, MD;  Location: [facility]AS[facility];  Service: Cardiovascular;  Laterality: N/A;   FEMORAL-POPLITEAL BYPASS GRAFT Right 06/19/2017   Procedure: BYPASS GRAFT FEMORAL-POPLITEAL ARTERY;  Surgeon: Jama Cordella MATSU, MD;  Location:  [facility] ORS;  Service: Vascular;  Laterality: Right;   FEMORAL-POPLITEAL BYPASS GRAFT     had 2 on right leg , 1 on the left   fractured nose repair     as a child   liver abcess removed      LOWER EXTREMITY ANGIOGRAPHY Right 01/15/2017   Procedure: Lower Extremity Angiography;  Surgeon: Jama Cordella MATSU, MD;  Location: [facility];  Service: Cardiovascular;  Laterality: Right;   LOWER EXTREMITY ANGIOGRAPHY Right 02/13/2017   Procedure: Lower Extremity Angiography;  Surgeon: Jama Cordella MATSU, MD;  Location: [facility];  Service: Cardiovascular;  Laterality: Right;   PLACEMENT OF LUMBAR DRAIN N/A 12/18/2023   Procedure: PLACEMENT OF  LUMBAR DRAIN;  Surgeon: Claudene Penne ORN, MD;  Location: [facility]AS[facility] CV LAB;  Service: Neurosurgery;  Laterality: N/A;   THORACIC AORTIC ENDOVASCULAR STENT GRAFT N/A 12/18/2023   Procedure: THORACIC AORTIC ENDOVASCULAR STENT GRAFT;  Surgeon: Jama Cordella MATSU, MD;  Location: [facility] INVAS[facility] CV [facility];  Service: Cardiovascular;  Laterality: N/A;   VASECTOMY N/A       No Known Allergies  Current Outpatient Medications  Medication Sig Dispense Refill   acetaminophen  (TYLENOL ) 500 MG tablet Take 1,000 mg by mouth every 6 (six) hours as needed.     albuterol  (VENTOLIN  HFA) 108 (90 Base) MCG/ACT inhaler INHALE 2 PUFFS INTO THE LUNGS EVERY 6 HOURS AS NEEDED FOR WHEEZING OR SHORTNESS OF BREATH. 8 g 2   apixaban  (ELIQUIS ) 5 MG TABS tablet TAKE 1 TABLET TWICE DAILY 180 tablet 1   aspirin  EC 81 MG tablet Take 1 tablet (81 mg total) by mouth daily. 150 tablet 2   atorvastatin  (LIPITOR ) 80 MG tablet TAKE 1 TABLET EVERY DAY 90 tablet 1   Dexlansoprazole  30 MG capsule DR Take 1 capsule (30 mg total) by mouth in the morning and at bedtime. 180 capsule 0   digoxin  (LANOXIN ) 0.25 MG tablet Take 1 tablet (250 mcg total) by mouth daily. 90 tablet 1   DULoxetine  (CYMBALTA ) 60 MG capsule TAKE 1 CAPSULE EVERY DAY 90 capsule 1   empagliflozin  (JARDIANCE ) 10 MG TABS tablet TAKE 1 TABLET EVERY DAY 90 tablet 1   famotidine  (PEPCID ) 40 MG tablet TAKE 1 TABLET EVERY DAY 90 tablet 3   finasteride  (PROSCAR ) 5 MG tablet Take 1 tablet (5 mg total) by mouth daily. 30 tablet 11   Fluticasone-Umeclidin-Vilant (TRELEGY ELLIPTA ) 200-62.5-25 MCG/ACT AEPB Inhale 1 puff into the lungs daily. 60 each 11   Fluticasone-Umeclidin-Vilant (TRELEGY ELLIPTA ) 200-62.5-25 MCG/ACT AEPB Inhale 1 Inhalation into the lungs daily in the afternoon. 2 each 0   gabapentin  (NEURONTIN ) 300 MG capsule TAKE 1 CAPSULE AT BEDTIME (IN ADDITION TO 600MG  FOR A TOTAL OF 900MG  AT BEDTIME) 90 capsule 1   gabapentin  (NEURONTIN ) 600 MG tablet TAKE 1 TABLET THREE  TIMES DAILY 270 tablet 3   hydrocortisone 1 % ointment Apply 1 Application topically as needed for itching.     metoprolol  succinate (TOPROL -XL) 25 MG 24 hr tablet TAKE 1 TABLET EVERY DAY 90 tablet 1   sildenafil  (VIAGRA ) 50 MG tablet TAKE 1 TABLET EVERY DAY AS NEEDED FOR ERECTILE DYSFUNCTION 90 tablet 0   spironolactone  (ALDACTONE ) 25 MG tablet Take 0.5 tablets (12.5 mg total) by mouth as needed. 45 tablet 1   tamsulosin  (FLOMAX ) 0.4 MG CAPS capsule Take 1 capsule (0.4 mg total) by mouth daily. 30 capsule 11   varenicline  (CHANTIX ) 1 MG tablet TAKE 1/2 TABLET DAILY FOR 3 DAYS, 1/2 TABLET  TWICE DAILY FOR 4 DAYS, THEN TAKE 1 TABLET TWICE DAILY 53 tablet 11   No current facility-administered medications for this visit.        Physical Exam There were no vitals taken for this visit. Gen:  WD/WN, NAD Skin: incision C/D/I     Assessment/Plan: 1. Infrarenal abdominal aortic aneurysm (AAA) without rupture (HCC) (Primary) Recommend:  Patient is status post successful endovascular repair of the AAA.   No further intervention is required at this time.   No endoleak is detected and the aneurysm sac is stable.  The patient will continue antiplatelet therapy as prescribed as well as aggressive management of hyperlipidemia. Exercise is encouraged.   However, endografts require continued surveillance with ultrasound or CT scan. This is mandatory to detect any changes that allow repressurization of the aneurysm sac.  The patient is informed that this would be asymptomatic.  The patient is reminded that lifelong routine surveillance is a necessity with an endograft. Patient will continue to follow-up at the specified interval with ultrasound of the aorta.  He is returning to [place] for several months and requested that he would call for follow-up when he returns to the [place] .  He will be due for ABIs with an aortic duplex.      Cordella Shawl 03/21/2024, 3:04 PM   This note was  created with Dragon medical transcription system.  Any errors from dictation are unintentional.

## 2024-03-23 ENCOUNTER — Encounter (INDEPENDENT_AMBULATORY_CARE_PROVIDER_SITE_OTHER): Payer: Self-pay | Admitting: Vascular Surgery

## 2024-03-23 ENCOUNTER — Ambulatory Visit (INDEPENDENT_AMBULATORY_CARE_PROVIDER_SITE_OTHER): Admitting: Vascular Surgery

## 2024-03-23 ENCOUNTER — Ambulatory Visit (INDEPENDENT_AMBULATORY_CARE_PROVIDER_SITE_OTHER)

## 2024-03-23 VITALS — BP 100/64 | HR 81 | Resp 16 | Ht 74.0 in | Wt 165.0 lb

## 2024-03-23 DIAGNOSIS — I714 Abdominal aortic aneurysm, without rupture, unspecified: Secondary | ICD-10-CM | POA: Diagnosis not present

## 2024-03-23 DIAGNOSIS — I7143 Infrarenal abdominal aortic aneurysm, without rupture: Secondary | ICD-10-CM

## 2024-03-28 ENCOUNTER — Other Ambulatory Visit: Payer: Self-pay | Admitting: Internal Medicine

## 2024-03-30 NOTE — Telephone Encounter (Signed)
 Requested Prescriptions  Pending Prescriptions Disp Refills   Dexlansoprazole  30 MG capsule DR Willy Med Name: DEXLANSOPRAZOLE  30 MG Oral Capsule Delayed Release] 180 capsule 3    Sig: TAKE 1 CAPSULE (30 MG TOTAL) BY MOUTH IN THE MORNING AND AT BEDTIME.     Gastroenterology: Proton Pump Inhibitors Passed - 03/30/2024  1:48 PM      Passed - Valid encounter within last 12 months    Recent Outpatient Visits           2 months ago Type 2 diabetes mellitus treated without insulin  [facility])   [place] [facility] [place], Angeline ORN, [place]

## 2024-04-15 ENCOUNTER — Other Ambulatory Visit: Payer: Self-pay | Admitting: Internal Medicine

## 2024-04-17 NOTE — Telephone Encounter (Signed)
 Requested Prescriptions  Pending Prescriptions Disp Refills   sildenafil  (VIAGRA ) 50 MG tablet [Pharmacy Med Name: SILDENAFIL  CITRATE 50 MG Oral Tablet] 18 tablet 3    Sig: TAKE 1 TABLET EVERY DAY AS NEEDED FOR ERECTILE DYSFUNCTION     Urology: Erectile Dysfunction Agents Passed - 04/17/2024  1:39 PM      Passed - AST in normal range and within 360 days    AST  Date Value Ref Range Status  07/09/2023 20 10 - 35 U/L Final         Passed - ALT in normal range and within 360 days    ALT  Date Value Ref Range Status  07/09/2023 27 9 - 46 U/L Final         Passed - Last BP in normal range    BP Readings from Last 1 Encounters:  03/23/24 100/64         Passed - Valid encounter within last 12 months    Recent Outpatient Visits           3 months ago Type 2 diabetes mellitus treated without insulin  (HCC)   [place] [facility] [place], Angeline ORN, NP              Refused Prescriptions Disp Refills   spironolactone  (ALDACTONE ) 25 MG tablet [Pharmacy Med Name: SPIRONOLACTONE  25 MG Oral Tablet] 45 tablet 3    Sig: TAKE 1/2 TABLET AS DIRECTED AS NEEDED     Cardiovascular: Diuretics - Aldosterone Antagonist Passed - 04/17/2024  1:39 PM      Passed - Cr in normal range and within 180 days    Creat  Date Value Ref Range Status  07/09/2023 0.82 0.70 - 1.35 mg/dL Final   Creatinine, Ser  Date Value Ref Range Status  02/13/2024 0.68 0.61 - 1.24 mg/dL Final   Creatinine, Urine  Date Value Ref Range Status  04/22/2023 86 20 - 320 mg/dL Final         Passed - K in normal range and within 180 days    Potassium  Date Value Ref Range Status  02/13/2024 4.2 3.5 - 5.1 mmol/L Final         Passed - Na in normal range and within 180 days    Sodium  Date Value Ref Range Status  02/13/2024 135 135 - 145 mmol/L Final  11/14/2020 137 134 - 144 mmol/L Final         Passed - eGFR is 30 or above and within 180 days    GFR, Est African American  Date Value Ref Range  Status  12/18/2017 109 > OR = 60 mL/min/1.17m2 Final   GFR, Est Non African American  Date Value Ref Range Status  12/18/2017 94 > OR = 60 mL/min/1.57m2 Final   GFR, Estimated  Date Value Ref Range Status  02/13/2024 >60 >60 mL/min Final    Comment:    (NOTE) Calculated using the CKD-EPI Creatinine Equation (2021)    eGFR  Date Value Ref Range Status  07/09/2023 97 > OR = 60 mL/min/1.5m2 Final  11/14/2020 105 >59 mL/min/1.73 Final         Passed - Last BP in normal range    BP Readings from Last 1 Encounters:  03/23/24 100/64         Passed - Valid encounter within last 6 months    Recent Outpatient Visits           3 months ago Type 2 diabetes  mellitus treated without insulin  [facility])   [place] [facility] [place], Angeline ORN, [place]

## 2024-05-29 ENCOUNTER — Other Ambulatory Visit: Payer: Self-pay | Admitting: Internal Medicine

## 2024-05-29 NOTE — Telephone Encounter (Signed)
 Too soon for refill.  Requested Prescriptions  Pending Prescriptions Disp Refills   digoxin  (LANOXIN ) 0.25 MG tablet [Pharmacy Med Name: DIGOXIN  250 MCG Oral Tablet] 90 tablet 3    Sig: TAKE 1 TABLET EVERY DAY     Cardiovascular:  Antiarrhythmic Agents - digoxin  Failed - 05/29/2024  4:15 PM      Failed - Ca in normal range and within 360 days    Calcium   Date Value Ref Range Status  02/13/2024 8.8 (L) 8.9 - 10.3 mg/dL Final         Passed - Cr in normal range and within 180 days    Creat  Date Value Ref Range Status  07/09/2023 0.82 0.70 - 1.35 mg/dL Final   Creatinine, Ser  Date Value Ref Range Status  02/13/2024 0.68 0.61 - 1.24 mg/dL Final   Creatinine, Urine  Date Value Ref Range Status  04/22/2023 86 20 - 320 mg/dL Final         Passed - Digoxin  (serum) in normal range and within 360 days    Digoxin  Level  Date Value Ref Range Status  02/13/2024 0.9 0.8 - 2.0 ng/mL Final    Comment:    Performed at [facility], [address] [place], [place] [address]         Passed - K in normal range and within 180 days    Potassium  Date Value Ref Range Status  02/13/2024 4.2 3.5 - 5.1 mmol/L Final         Passed - Mg Level in normal range and within 360 days    Magnesium   Date Value Ref Range Status  02/12/2024 1.9 1.7 - 2.4 mg/dL Final    Comment:    Performed at [facility], [address] [place], [place] [address]         Passed - Patient had ECG in the last 360 days      Passed - Last Heart Rate in normal range    Pulse Readings from Last 1 Encounters:  03/23/24 81         Passed - Valid encounter within last 6 months    Recent Outpatient Visits           4 months ago Type 2 diabetes mellitus treated without insulin  [facility])   [place] [facility] [place], Angeline ORN, NP               varenicline  (CHANTIX ) 1 MG tablet [Pharmacy Med Name: VARENICLINE  TARTRATE 1 MG Oral Tablet] 180 tablet 3    Sig: TAKE 1 TABLET  TWICE DAILY     Psychiatry:  Drug Dependence Therapy - varenicline  Failed - 05/29/2024  4:15 PM      Failed - Manual Review: Do not refill starter pack. 1 mg tabs may be extended up to one year if the patient has quit smoking but still feels at risk for relapse.      Passed - Cr in normal range and within 180 days    Creat  Date Value Ref Range Status  07/09/2023 0.82 0.70 - 1.35 mg/dL Final   Creatinine, Ser  Date Value Ref Range Status  02/13/2024 0.68 0.61 - 1.24 mg/dL Final   Creatinine, Urine  Date Value Ref Range Status  04/22/2023 86 20 - 320 mg/dL Final         Passed - Completed PHQ-2 or PHQ-9 in the last 360 days      Passed - Valid encounter  within last 6 months    Recent Outpatient Visits           4 months ago Type 2 diabetes mellitus treated without insulin  [facility])   [facility] [place], Angeline ORN, [place]

## 2024-06-09 NOTE — Progress Notes (Signed)
 Ricardo Cervantes.                                          MRN: [id number]   06/09/2024   The [facility]BCI Quality Team Specialist reviewed this patient medical record for the purposes of chart review for care gap closure. The following were reviewed: chart review for care gap closure-kidney health evaluation for diabetes:eGFR  and uACR.    VBCI Quality Team

## 2024-06-22 ENCOUNTER — Ambulatory Visit: Payer: Self-pay

## 2024-06-22 NOTE — Telephone Encounter (Signed)
 FYI Only or Action Required?: FYI only for provider.  Patient was last seen in primary care on 01/07/2024 by Antonette Angeline ORN, NP.  Called Nurse Triage reporting Otalgia.  Symptoms began 1 yr ago, worse x 2 wks.  Interventions attempted: Nothing.  Symptoms are: gradually worsening.  Triage Disposition: See Today or Tomorrow in Office  Patient/caregiver understands and will follow disposition?: Yes  Answer Assessment - Initial Assessment Questions Pt reports chronic right ear infection with draininage  1. LOCATION: Which ear is involved?     Right ear  2. ONSET: When did the ear pain start?      >1 year  3. SEVERITY: How bad is the pain?  (Scale 1-10; mild, moderate or severe)     5/10  Protocols used: Earache-A-AH   Message from [facility] sent at 06/22/2024  9:49 AM EDT  Reason for Triage: Patient complaining of a worsening ear infection. Attempted to make an appt however, he stated symptoms have worsened in the past 2 wks. Has been treated in the past for ear infections by Dr. Antonette but symptoms still present.   [place]- [phone]

## 2024-06-23 ENCOUNTER — Ambulatory Visit (INDEPENDENT_AMBULATORY_CARE_PROVIDER_SITE_OTHER): Admitting: Internal Medicine

## 2024-06-23 ENCOUNTER — Encounter: Payer: Self-pay | Admitting: Internal Medicine

## 2024-06-23 VITALS — BP 108/64 | Ht 74.0 in | Wt 161.6 lb

## 2024-06-23 DIAGNOSIS — H9201 Otalgia, right ear: Secondary | ICD-10-CM | POA: Diagnosis not present

## 2024-06-23 DIAGNOSIS — H608X1 Other otitis externa, right ear: Secondary | ICD-10-CM

## 2024-06-23 DIAGNOSIS — H9193 Unspecified hearing loss, bilateral: Secondary | ICD-10-CM

## 2024-06-23 MED ORDER — NEOMYCIN-POLYMYXIN-HC 3.5-10000-1 OT SOLN: 4.0000 [drp] | Freq: Four times a day (QID) | OTIC | 0 refills | Status: AC

## 2024-06-23 NOTE — Patient Instructions (Signed)
Otitis Externa  Otitis externa is an infection of the outer ear canal. The outer ear canal is the area between the outside of the ear and the eardrum. Otitis externa is sometimes called swimmer's ear. What are the causes? Common causes of this condition include: Swimming in dirty water. Moisture in the ear. An injury to the inside of the ear. An object stuck in the ear. A cut or scrape on the outside of the ear or in the ear canal. What increases the risk? You are more likely to get this condition if you go swimming often. What are the signs or symptoms? Itching in the ear. This is often the first symptom. Swelling of the ear. Redness in the ear. Ear pain. The pain may get worse when you pull on your ear. Pus coming from the ear. How is this treated? This condition may be treated with: Antibiotic ear drops. These are often given for 10-14 days. Medicines to reduce itching and swelling. Follow these instructions at home: If you were prescribed antibiotic ear drops, use them as told by your doctor. Do not stop using them even if you start to feel better. Take over-the-counter and prescription medicines only as told by your doctor. Avoid getting water in your ears as told by your doctor. You may be told to avoid swimming or water sports for a few days. Keep all follow-up visits. How is this prevented? Keep your ears dry. Use the corner of a towel to dry your ears after you swim or bathe. Try not to scratch or put things in your ear. Doing these things makes it easier for germs to grow in your ear. Avoid swimming in lakes, dirty water, or swimming pools that may not have the right amount of a chemical called chlorine. Contact a doctor if: You have a fever. Your ear is still red, swollen, or painful after 3 days. You still have pus coming from your ear after 3 days. Your redness, swelling, or pain gets worse. You have a very bad headache. Get help right away if: You have redness,  swelling, and pain or tenderness behind your ear. Summary Otitis externa is an infection of the outer ear canal. Symptoms include pain, redness, and swelling of the ear. If you were prescribed antibiotic ear drops, use them as told by your doctor. Do not stop using them even if you start to feel better. Try not to scratch or put things in your ear. This information is not intended to replace advice given to you by your health care provider. Make sure you discuss any questions you have with your health care provider. Document Revised: 11/02/2020 Document Reviewed: 11/02/2020 Elsevier Patient Education  2024 Elsevier Inc.  

## 2024-06-23 NOTE — Progress Notes (Signed)
 Subjective:    Patient ID: Ricardo Cervantes., male    DOB: Apr 25, 1956, 68 y.o.   MRN: [id number]  HPI  Discussed the use of AI scribe software for clinical note transcription with the patient, who gave verbal consent to proceed.  Ricardo Cervantes. is a 68 year old male who presents with chronic ear drainage and hearing issues.  He has been experiencing ear discomfort for about a year, characterized by drainage and hearing loss. The drainage varies between clear and white, and he describes the pain as achy with significant itching. He has been using ear drops, which provided temporary relief, but he has not used them in about a week.  He reports that the ear feels full of wax and that he has difficulty hearing, stating 'that ear doesn't work anyway'. No fever is present.  Review of Systems     Past Medical History:  Diagnosis Date   Aneurysm of descending thoracic aorta without rupture    a.) s/p TEVAR 12/18/2023 --> 34 x 34 x 15 stent with a 40 x 40 x 20 stent and an additional 40 x 40 x 15 stent component   Aortic atherosclerosis    Atrial fibrillation (HCC)    a.) CHA2DS2VASc = 7 (age, HFrEF, HTN, CVA x 2, vascular disease, T2DM) as of 11/28/2023; b.) rate/rhythm maintained on oral digoxin  + metoprolol  succinate; chronically anticoagulated with apixaban    Atypical mycobacterial infection of lung (HCC)    Bipolar disorder (HCC)    BPH (benign prostatic hyperplasia)    CAD (coronary artery disease)    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    Complication of anesthesia    a.) difficulty maintaining adequate sedation/anesthesia; premature emergence; b.) fentanyl  makes patient mean, grumpy, and difficult   COPD (chronic obstructive pulmonary disease) (HCC)    CVA (cerebral vascular accident) (HCC)    DDD (degenerative disc disease), cervical    a.) s/p ACDF C6-C7   DDD (degenerative disc disease), lumbosacral    Depression    Diverticulosis    Dyspnea    Erectile  dysfunction    a.) on PDE5i (sildenafil ) PRN   GERD (gastroesophageal reflux disease)    Headache    Hepatitis    Hepatitis C virus infection cured after antiviral drug therapy    a.) s/p completed Tx course of ledipasvir-sofosbuvir   HFrEF (heart failure with reduced ejection fraction) (HCC)    History of bilateral cataract extraction 03/2022   History of kidney stones 2016   History of TB (tuberculosis)    HTN (hypertension)    Hyperlipidemia    Infrarenal abdominal aortic aneurysm (AAA) without rupture    Myocardial bridge    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     albuterol  (VENTOLIN  HFA) 108 (90 Base) MCG/ACT inhaler INHALE 2 PUFFS INTO THE LUNGS EVERY 6 HOURS AS NEEDED FOR WHEEZING OR SHORTNESS OF BREATH. 8 g 2   apixaban  (ELIQUIS ) 5 MG TABS tablet TAKE 1 TABLET TWICE DAILY 180  tablet 1   aspirin  EC 81 MG tablet Take 1 tablet (81 mg total) by mouth daily. 150 tablet 2   atorvastatin  (LIPITOR ) 80 MG tablet TAKE 1 TABLET EVERY DAY 90 tablet 1   Dexlansoprazole  30 MG capsule DR TAKE 1 CAPSULE (30 MG TOTAL) BY MOUTH IN THE MORNING AND AT BEDTIME. 180 capsule 1   digoxin  (LANOXIN ) 0.25 MG tablet Take 1 tablet (250 mcg total) by mouth daily. 90 tablet 1   DULoxetine  (CYMBALTA ) 60 MG capsule TAKE 1 CAPSULE EVERY DAY 90 capsule 1   empagliflozin  (JARDIANCE ) 10 MG TABS tablet TAKE 1 TABLET EVERY DAY 90 tablet 1   famotidine  (PEPCID ) 40 MG tablet TAKE 1 TABLET EVERY DAY 90 tablet 3   finasteride  (PROSCAR ) 5 MG tablet Take 1 tablet (5 mg total) by mouth daily. 30 tablet 11    Fluticasone-Umeclidin-Vilant (TRELEGY ELLIPTA ) 200-62.5-25 MCG/ACT AEPB Inhale 1 puff into the lungs daily. 60 each 11   Fluticasone-Umeclidin-Vilant (TRELEGY ELLIPTA ) 200-62.5-25 MCG/ACT AEPB Inhale 1 Inhalation into the lungs daily in the afternoon. 2 each 0   gabapentin  (NEURONTIN ) 300 MG capsule TAKE 1 CAPSULE AT BEDTIME (IN ADDITION TO 600MG  FOR A TOTAL OF 900MG  AT BEDTIME) 90 capsule 1   gabapentin  (NEURONTIN ) 600 MG tablet TAKE 1 TABLET THREE TIMES DAILY 270 tablet 3   hydrocortisone 1 % ointment Apply 1 Application topically as needed for itching.     metoprolol  succinate (TOPROL -XL) 25 MG 24 hr tablet TAKE 1 TABLET EVERY DAY 90 tablet 1   sildenafil  (VIAGRA ) 50 MG tablet TAKE 1 TABLET EVERY DAY AS NEEDED FOR ERECTILE DYSFUNCTION 18 tablet 3   spironolactone  (ALDACTONE ) 25 MG tablet Take 0.5 tablets (12.5 mg total) by mouth as needed. 45 tablet 1   tamsulosin  (FLOMAX ) 0.4 MG CAPS capsule Take 1 capsule (0.4 mg total) by mouth daily. 30 capsule 11   varenicline  (CHANTIX ) 1 MG tablet TAKE 1/2 TABLET DAILY FOR 3 DAYS, 1/2 TABLET TWICE DAILY FOR 4 DAYS, THEN TAKE 1 TABLET TWICE DAILY 53 tablet 11   No current facility-administered medications for this visit.    No Known Allergies   Family History  Problem Relation Age of Onset   Cancer Mother        liver   Heart disease Father    Diabetes Father    Breast cancer Sister    Brain cancer Sister     Social History   Socioeconomic History   Marital status: Married    Spouse name: Not on file   Number of children: Not on file   Years of education: Not on file   Highest education level: Some college, no degree  Occupational History   Not on file  Tobacco Use   Smoking status: Every Day    Current packs/day: 3.00    Average packs/day: 3.0 packs/day for 10.8 years (32.4 ttl pk-yrs)    Types: Cigarettes    Start date: 09/03/2013   Smokeless tobacco: Never   Tobacco comments:    Smoking 4 cigarettes daily 09/20/2023    Smoking 2  -3 daily - 11/26/23  Vaping Use   Vaping status: Former  Substance and Sexual Activity   Alcohol  use: Not Currently    Alcohol /week: 2.0 standard drinks of alcohol     Types: 2 Shots of liquor per week    Comment: occasional (every 3-4 weeks)    Drug use: Yes    Types: Cocaine , Heroin, Marijuana, LSD    Comment: polysubstance approx 2 years ago  Sexual activity: Not Currently  Other Topics Concern   Not on file  Social History Narrative   W[place] live out the country, lives with son, Jaxxon Naeem, III.   Social Drivers of Corporate investment banker Strain: Low Risk  (02/28/2024)   Overall Financial Resource Strain (CARDIA)    Difficulty of Paying Living Expenses: Not hard at all  Food Insecurity: No Food Insecurity (02/28/2024)   Hunger Vital Sign    Worried About Running Out of Food in the Last Year: Never true    Ran Out of Food in the Last Year: Never true  Transportation Needs: No Transportation Needs (02/28/2024)   PRAPARE - Administrator, Civil Service (Medical): No    Lack of Transportation (Non-Medical): No  Physical Activity: Inactive (02/28/2024)   Exercise Vital Sign    Days of Exercise per Week: 0 days    Minutes of Exercise per Session: 0 min  Stress: No Stress Concern Present (02/28/2024)   [employer] of Occupational Health - Occupational Stress Questionnaire    Feeling of Stress: Only a little  Social Connections: Moderately Isolated (02/28/2024)   Social Connection and Isolation Panel    Frequency of Communication with Friends and Family: More than three times a week    Frequency of Social Gatherings with Friends and Family: More than three times a week    Attends Religious Services: Never    Database administrator or Organizations: No    Attends Banker Meetings: Never    Marital Status: Living with partner  Intimate Partner Violence: Not At Risk (02/28/2024)   Humiliation, Afraid, Rape, and Kick questionnaire    Fear of Current  or Ex-Partner: No    Emotionally Abused: No    Physically Abused: No    Sexually Abused: No     Constitutional: Patient reports chronic fatigue.  Denies fever, malaise, headache or abrupt weight changes.  HEENT: Patient reports right ear pain.  Denies eye pain, eye redness, ringing in the ears, wax buildup, runny nose, nasal congestion, bloody nose, or sore throat. Respiratory: Patient reports chronic cough and shortness of breath.  Denies difficulty breathing, or sputum production.   Cardiovascular: Denies chest pain, chest tightness, palpitations or swelling in the hands or feet.  ports difficulty with gait.  Denies decrease in range of motion, muscle pain, joint pain or swelling.  Skin: Denies redness, rashes, lesions or ulcercations.  Neurological: Patient reports neuropathic pain.  Denies dizziness, difficulty with memory, difficulty with speech or problems with balance and coordination.    No other specific complaints in a complete review of systems (except as listed in HPI above).  Objective:   Physical Exam BP 108/64 (BP Location: Left Arm, Patient Position: Sitting, Cuff Size: Normal)   Ht 6' 2 (1.88 m)   Wt 161 lb 9.6 oz (73.3 kg)   BMI 20.75 kg/m     Wt Readings from Last 3 Encounters:  03/23/24 165 lb (74.8 kg)  03/10/24 166 lb 6.4 oz (75.5 kg)  03/04/24 160 lb (72.6 kg)    General: Appears his stated age, chronically ill-appearing, in NAD. Skin: Dry and intact.  Discoloration noted of BLE.  No ulcerations noted. HEENT: Head: normal shape and size; Eyes: sclera white, no icterus, conjunctiva pink, PERRLA and EOMs intact; Right Ear: Canal filled with white matter, unable to completely visualize TM Cardiovascular: Normal rate and rhythm. S1,S2 noted.  No murmur, rubs or gallops noted. No JVD or BLE edema. No  carotid bruits noted.   Pulmonary/Chest: Normal effort and diminished breath sounds. No respiratory distress. No wheezes, rales or ronchi noted.   Musculoskeletal: Gait slow and steady with use of cane. Neurological: Alert and oriented.   BMET    Component Value Date/Time   NA 135 02/13/2024 0339   NA 137 11/14/2020 1409   K 4.2 02/13/2024 0339   CL 101 02/13/2024 0339   CO2 26 02/13/2024 0339   GLUCOSE 184 (H) 02/13/2024 0339   BUN 17 02/13/2024 0339   BUN 15 11/14/2020 1409   CREATININE 0.68 02/13/2024 0339   CREATININE 0.82 07/09/2023 1341   CALCIUM  8.8 (L) 02/13/2024 0339   GFRNONAA >60 02/13/2024 0339   GFRNONAA 94 12/18/2017 1137   GFRAA 109 12/18/2017 1137    Lipid Panel     Component Value Date/Time   CHOL 157 01/07/2024 1340   CHOL 217 (H) 12/23/2015 1606   TRIG 126 01/07/2024 1340   HDL 36 (L) 01/07/2024 1340   HDL 29 (L) 12/23/2015 1606   CHOLHDL 4.4 01/07/2024 1340   LDLCALC 98 01/07/2024 1340    CBC    Component Value Date/Time   WBC 8.8 02/13/2024 0339   RBC 4.02 (L) 02/13/2024 0339   HGB 11.9 (L) 02/13/2024 0339   HGB 12.9 (L) 11/14/2020 1409   HCT 38.0 (L) 02/13/2024 0339   HCT 40.0 11/14/2020 1409   PLT 185 02/13/2024 0339   PLT 287 11/14/2020 1409   MCV 94.5 02/13/2024 0339   MCV 95 11/14/2020 1409   MCH 29.6 02/13/2024 0339   MCHC 31.3 02/13/2024 0339   RDW 15.8 (H) 02/13/2024 0339   RDW 14.4 11/14/2020 1409   LYMPHSABS 1.0 02/06/2024 0927   LYMPHSABS 0.9 11/14/2020 1409   MONOABS 0.6 02/06/2024 0927   EOSABS 0.1 02/06/2024 0927   EOSABS 0.2 11/14/2020 1409   BASOSABS 0.1 02/06/2024 0927   BASOSABS 0.0 11/14/2020 1409    Hgb A1C Lab Results  Component Value Date   HGBA1C 6.8 (H) 01/07/2024          Assessment & Plan:   Assessment and Plan  Chronic right ear drainage and pruritus Persistent ear drainage and pruritus, likely due to eczema. No infection signs. - Prescribed Cortisporin ear drops for itching. - Referred to ENT for further evaluation.  Hearing loss, right ear Significant hearing loss with forty percent wax buildup. - Flush right ear to remove wax  buildup.  RTC in 1 months for your annual exam Angeline Laura, NP

## 2024-06-24 ENCOUNTER — Other Ambulatory Visit: Payer: Self-pay | Admitting: Internal Medicine

## 2024-06-25 NOTE — Progress Notes (Signed)
 Jamond E Lavallee Jr.                                          MRN: [id number]   06/25/2024   The [facility] Quality Team Specialist reviewed this patient medical record for the purposes of chart review for care gap closure. The following were reviewed: chart review for care gap closure-colorectal cancer screening.    [facility] Quality Team

## 2024-06-25 NOTE — Progress Notes (Signed)
 Greely E Stambaugh Jr.                                          MRN: [id number]   06/25/2024   The [facility] Quality Team Specialist reviewed this patient medical record for the purposes of chart review for care gap closure. The following were reviewed: chart review for care gap closure-diabetic eye exam.    [facility] Quality Team

## 2024-06-26 NOTE — Telephone Encounter (Signed)
 Requested Prescriptions  Pending Prescriptions Disp Refills   DULoxetine  (CYMBALTA ) 60 MG capsule [Pharmacy Med Name: DULOXETINE  HYDROCHLORIDEDR 60 MG Oral Capsule Delayed Release Particles] 90 capsule 0    Sig: TAKE 1 CAPSULE EVERY DAY     Psychiatry: Antidepressants - SNRI - duloxetine  Passed - 06/26/2024  8:03 AM      Passed - Cr in normal range and within 360 days    Creat  Date Value Ref Range Status  07/09/2023 0.82 0.70 - 1.35 mg/dL Final   Creatinine, Ser  Date Value Ref Range Status  02/13/2024 0.68 0.61 - 1.24 mg/dL Final   Creatinine, Urine  Date Value Ref Range Status  04/22/2023 86 20 - 320 mg/dL Final         Passed - eGFR is 30 or above and within 360 days    GFR, Est African American  Date Value Ref Range Status  12/18/2017 109 > OR = 60 mL/min/1.72m2 Final   GFR, Est Non African American  Date Value Ref Range Status  12/18/2017 94 > OR = 60 mL/min/1.41m2 Final   GFR, Estimated  Date Value Ref Range Status  02/13/2024 >60 >60 mL/min Final    Comment:    (NOTE) Calculated using the CKD-EPI Creatinine Equation (2021)    eGFR  Date Value Ref Range Status  07/09/2023 97 > OR = 60 mL/min/1.74m2 Final  11/14/2020 105 >59 mL/min/1.73 Final         Passed - Completed PHQ-2 or PHQ-9 in the last 360 days      Passed - Last BP in normal range    BP Readings from Last 1 Encounters:  06/23/24 108/64         Passed - Valid encounter within last 6 months    Recent Outpatient Visits           3 days ago Right ear pain   [place] [facility] [place], Angeline ORN, NP   5 months ago Type 2 diabetes mellitus treated without insulin  [facility])   [place] [facility] [place], [place], NP               atorvastatin  (LIPITOR ) 80 MG tablet [Pharmacy Med Name: ATORVASTATIN  CALCIUM  80 MG Oral Tablet] 90 tablet 0    Sig: TAKE 1 TABLET EVERY DAY     Cardiovascular:  Antilipid - Statins Failed - 06/26/2024  8:03 AM      Failed -  Lipid Panel in normal range within the last 12 months    Cholesterol, Total  Date Value Ref Range Status  12/23/2015 217 (H) 100 - 199 mg/dL Final   Cholesterol  Date Value Ref Range Status  01/07/2024 157 <200 mg/dL Final   LDL Cholesterol (Calc)  Date Value Ref Range Status  01/07/2024 98 mg/dL (calc) Final    Comment:    Reference range: <100 . Desirable range <100 mg/dL for primary prevention;   <70 mg/dL for patients with CHD or diabetic patients  with > or = 2 CHD risk factors. SABRA LDL-C is now calculated using the Martin-Hopkins  calculation, which is a validated novel method providing  better accuracy than the Friedewald equation in the  estimation of LDL-C.  Gladis APPLETHWAITE et al. SANDREA. 7986;689(80): 2061-2068  (http://education.[facility].com/faq/FAQ164)    HDL  Date Value Ref Range Status  01/07/2024 36 (L) > OR = 40 mg/dL Final  95/78/7982 29 (L) >39 mg/dL Final   Triglycerides  Date Value Ref Range Status  01/07/2024 126 <150 mg/dL Final         Passed - Patient is not pregnant      Passed - Valid encounter within last 12 months    Recent Outpatient Visits           3 days ago Right ear pain   [place] [facility] [place], [place], NP   5 months ago Type 2 diabetes mellitus treated without insulin  [facility])   [place] [facility] [place], Angeline ORN, [place]

## 2024-06-30 ENCOUNTER — Ambulatory Visit (INDEPENDENT_AMBULATORY_CARE_PROVIDER_SITE_OTHER): Admitting: Pulmonary Disease

## 2024-06-30 ENCOUNTER — Encounter: Payer: Self-pay | Admitting: Pulmonary Disease

## 2024-06-30 VITALS — BP 126/76 | HR 94 | Temp 97.9°F | Ht 74.0 in | Wt 164.4 lb

## 2024-06-30 DIAGNOSIS — A310 Pulmonary mycobacterial infection: Secondary | ICD-10-CM

## 2024-06-30 DIAGNOSIS — J449 Chronic obstructive pulmonary disease, unspecified: Secondary | ICD-10-CM

## 2024-06-30 DIAGNOSIS — R0602 Shortness of breath: Secondary | ICD-10-CM | POA: Diagnosis not present

## 2024-06-30 DIAGNOSIS — J984 Other disorders of lung: Secondary | ICD-10-CM

## 2024-06-30 DIAGNOSIS — F17210 Nicotine dependence, cigarettes, uncomplicated: Secondary | ICD-10-CM | POA: Diagnosis not present

## 2024-06-30 NOTE — Progress Notes (Unsigned)
 Subjective:    Patient ID: Ricardo Cervantes., male    DOB: November 07, 1955, 68 y.o.   MRN: [id number]  Patient Care Team: Antonette Angeline ORN, NP as PCP - General (Internal Medicine)  Chief Complaint  Patient presents with   COPD    Cough, shortness of breath and wheezing.     BACKGROUND/INTERVAL:  HPI   Review of Systems A 10 point review of systems was performed and it is as noted above otherwise negative.   Patient Active Problem List   Diagnosis Date Noted   Thoracic aortic aneurysm without rupture 12/18/2023   AAA (abdominal aortic aneurysm) without rupture 09/29/2023   Depression 04/12/2023   BPH (benign prostatic hyperplasia) 12/14/2021   GERD (gastroesophageal reflux disease) 07/31/2021   DM (diabetes mellitus), type 2 with complications (HCC) 07/31/2021   Stage 3 severe COPD by GOLD classification (HCC) 10/18/2020   HFrEF (heart failure with reduced ejection fraction) (HCC) 10/18/2020   History of stroke with residual effects 10/18/2020   Atrial fibrillation (HCC) 09/04/2020   Hyperlipidemia associated with type 2 diabetes mellitus (HCC) 07/04/2017   Atherosclerosis of native arteries of extremity with intermittent claudication 06/19/2017   Erectile dysfunction 12/23/2015    Social History   Tobacco Use   Smoking status: Every Day    Current packs/day: 3.00    Average packs/day: 3.0 packs/day for 10.8 years (32.5 ttl pk-yrs)    Types: Cigarettes    Start date: 09/03/2013   Smokeless tobacco: Never   Tobacco comments:    Smoking 4 cigarettes daily 09/20/2023    Smoking 2 -3 daily - 11/26/23  Substance Use Topics   Alcohol  use: Not Currently    Alcohol /week: 2.0 standard drinks of alcohol     Types: 2 Shots of liquor per week    Comment: occasional (every 3-4 weeks)     No Known Allergies  Current Meds  Medication Sig   acetaminophen  (TYLENOL ) 500 MG tablet Take 1,000 mg by mouth every 6 (six) hours as needed.   albuterol  (VENTOLIN  HFA) 108 (90 Base)  MCG/ACT inhaler INHALE 2 PUFFS INTO THE LUNGS EVERY 6 HOURS AS NEEDED FOR WHEEZING OR SHORTNESS OF BREATH.   apixaban  (ELIQUIS ) 5 MG TABS tablet TAKE 1 TABLET TWICE DAILY   aspirin  EC 81 MG tablet Take 1 tablet (81 mg total) by mouth daily.   atorvastatin  (LIPITOR ) 80 MG tablet TAKE 1 TABLET EVERY DAY   Dexlansoprazole  30 MG capsule DR TAKE 1 CAPSULE (30 MG TOTAL) BY MOUTH IN THE MORNING AND AT BEDTIME.   digoxin  (LANOXIN ) 0.25 MG tablet Take 1 tablet (250 mcg total) by mouth daily.   DULoxetine  (CYMBALTA ) 60 MG capsule TAKE 1 CAPSULE EVERY DAY   empagliflozin  (JARDIANCE ) 10 MG TABS tablet TAKE 1 TABLET EVERY DAY   famotidine  (PEPCID ) 40 MG tablet TAKE 1 TABLET EVERY DAY   finasteride  (PROSCAR ) 5 MG tablet Take 1 tablet (5 mg total) by mouth daily.   Fluticasone-Umeclidin-Vilant (TRELEGY ELLIPTA ) 200-62.5-25 MCG/ACT AEPB Inhale 1 puff into the lungs daily.   gabapentin  (NEURONTIN ) 300 MG capsule TAKE 1 CAPSULE AT BEDTIME (IN ADDITION TO 600MG  FOR A TOTAL OF 900MG  AT BEDTIME)   gabapentin  (NEURONTIN ) 600 MG tablet TAKE 1 TABLET THREE TIMES DAILY   hydrocortisone 1 % ointment Apply 1 Application topically as needed for itching.   metoprolol  succinate (TOPROL -XL) 25 MG 24 hr tablet TAKE 1 TABLET EVERY DAY   neomycin -polymyxin-hydrocortisone (CORTISPORIN) OTIC solution Place 4 drops into the right ear 4 (four) times  daily.   sildenafil  (VIAGRA ) 50 MG tablet TAKE 1 TABLET EVERY DAY AS NEEDED FOR ERECTILE DYSFUNCTION   spironolactone  (ALDACTONE ) 25 MG tablet Take 0.5 tablets (12.5 mg total) by mouth as needed.   tamsulosin  (FLOMAX ) 0.4 MG CAPS capsule Take 1 capsule (0.4 mg total) by mouth daily.   varenicline  (CHANTIX ) 1 MG tablet TAKE 1/2 TABLET DAILY FOR 3 DAYS, 1/2 TABLET TWICE DAILY FOR 4 DAYS, THEN TAKE 1 TABLET TWICE DAILY    Immunization History  Administered Date(s) Administered   Influenza-Unspecified 09/16/2020   Moderna Sars-Covid-2 Vaccination 09/14/2020   PFIZER Comirnaty(Gray  Top)Covid-19 Tri-Sucrose Vaccine 10/12/2020        Objective:     BP 126/76   Pulse 94   Temp 97.9 F (36.6 C) (Temporal)   Ht 6' 2 (1.88 m)   Wt 164 lb 6.4 oz (74.6 kg)   SpO2 94%   BMI 21.11 kg/m   SpO2: 94 %  GENERAL: HEAD: Normocephalic, atraumatic.  EYES: Pupils equal, round, reactive to light.  No scleral icterus.  MOUTH:  NECK: Supple. No thyromegaly. Trachea midline. No JVD.  No adenopathy. PULMONARY: Good air entry bilaterally.  No adventitious sounds. CARDIOVASCULAR: S1 and S2. Regular rate and rhythm.  ABDOMEN: MUSCULOSKELETAL: No joint deformity, no clubbing, no edema.  NEUROLOGIC:  SKIN: Intact,warm,dry. PSYCH:        Assessment & Plan:   No diagnosis found.  No orders of the defined types were placed in this encounter.   No orders of the defined types were placed in this encounter.     Advised if symptoms do not improve or worsen, to please contact office for sooner follow up or seek emergency care.    I spent xxx minutes of dedicated to the care of this patient on the date of this encounter to include pre-visit review of records, face-to-face time with the patient discussing conditions above, post visit ordering of testing, clinical documentation with the electronic health record, making appropriate referrals as documented, and communicating necessary findings to members of the patients care team.     C. Leita Sanders, MD Advanced Bronchoscopy PCCM [facility]    *This note was generated using voice recognition software/Dragon and/or AI transcription program.  Despite best efforts to proofread, errors can occur which can change the meaning. Any transcriptional errors that result from this process are unintentional and may not be fully corrected at the time of dictation.

## 2024-06-30 NOTE — Patient Instructions (Signed)
 VISIT SUMMARY:  You came in today for a follow-up on your cough and shortness of breath. We discussed your ongoing issues with COPD and the cavitary lung lesion in your lung. You mentioned that your shortness of breath has been severe, and you had trouble getting your medication while traveling. We also talked about your concerns regarding the lung lesion and the need for further evaluation.  YOUR PLAN:  -CHRONIC OBSTRUCTIVE PULMONARY DISEASE (COPD) WITH PERSISTENT SHORTNESS OF BREATH: COPD is a chronic lung condition that makes it hard to breathe. You mentioned that your shortness of breath has been severe, but Trelegy helps when you have it. We need to make sure you always have access to Trelegy, and we will perform pulmonary function tests to check how well your lungs are working.  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 We are still evaluating it to see if it is caused by Mycobacterium. Since previous cultures were unsuccessful, we will do a chest CT scan and schedule a robotic-assisted bronchoscopy under general anesthesia to get a better sample for accurate cultures.  INSTRUCTIONS:  Please ensure you have a continuous supply of Trelegy to manage your COPD symptoms. We will schedule you for pulmonary function tests to assess your lung function. Additionally, you will need to undergo a chest CT scan and a robotic-assisted bronchoscopy to further evaluate the cavitary lung lesion. We will contact you to arrange these appointments.

## 2024-07-01 ENCOUNTER — Encounter: Payer: Self-pay | Admitting: Pulmonary Disease

## 2024-07-05 ENCOUNTER — Other Ambulatory Visit: Payer: Self-pay | Admitting: Internal Medicine

## 2024-07-07 NOTE — Telephone Encounter (Signed)
 Requested Prescriptions  Pending Prescriptions Disp Refills   spironolactone  (ALDACTONE ) 25 MG tablet [Pharmacy Med Name: SPIRONOLACTONE  25 MG Oral Tablet] 45 tablet 3    Sig: TAKE 1/2 TABLET AS DIRECTED AS NEEDED     Cardiovascular: Diuretics - Aldosterone Antagonist Passed - 07/07/2024 12:48 PM      Passed - Cr in normal range and within 180 days    Creat  Date Value Ref Range Status  07/09/2023 0.82 0.70 - 1.35 mg/dL Final   Creatinine, Ser  Date Value Ref Range Status  02/13/2024 0.68 0.61 - 1.24 mg/dL Final   Creatinine, Urine  Date Value Ref Range Status  04/22/2023 86 20 - 320 mg/dL Final         Passed - K in normal range and within 180 days    Potassium  Date Value Ref Range Status  02/13/2024 4.2 3.5 - 5.1 mmol/L Final         Passed - Na in normal range and within 180 days    Sodium  Date Value Ref Range Status  02/13/2024 135 135 - 145 mmol/L Final  11/14/2020 137 134 - 144 mmol/L Final         Passed - eGFR is 30 or above and within 180 days    GFR, Est African American  Date Value Ref Range Status  12/18/2017 109 > OR = 60 mL/min/1.47m2 Final   GFR, Est Non African American  Date Value Ref Range Status  12/18/2017 94 > OR = 60 mL/min/1.7m2 Final   GFR, Estimated  Date Value Ref Range Status  02/13/2024 >60 >60 mL/min Final    Comment:    (NOTE) Calculated using the CKD-EPI Creatinine Equation (2021)    eGFR  Date Value Ref Range Status  07/09/2023 97 > OR = 60 mL/min/1.53m2 Final  11/14/2020 105 >59 mL/min/1.73 Final         Passed - Last BP in normal range    BP Readings from Last 1 Encounters:  06/30/24 126/76         Passed - Valid encounter within last 6 months    Recent Outpatient Visits           2 weeks ago Right ear pain   [place] [facility] [place], Angeline ORN, NP   6 months ago Type 2 diabetes mellitus treated without insulin  [facility])   [place] [facility] [place], Angeline ORN, NP                albuterol  (VENTOLIN  HFA) 108 [phone] Base) MCG/ACT inhaler [Pharmacy Med Name: ALBUTEROL  SULFATE HFA 108 (90 Base) MCG/ACT Inhalation Aerosol Solution] 1 each     Sig: INHALE 2 PUFFS INTO THE LUNGS EVERY 6 HOURS AS NEEDED FOR WHEEZING OR SHORTNESS OF BREATH.     Pulmonology:  Beta Agonists 2 Passed - 07/07/2024 12:48 PM      Passed - Last BP in normal range    BP Readings from Last 1 Encounters:  06/30/24 126/76         Passed - Last Heart Rate in normal range    Pulse Readings from Last 1 Encounters:  06/30/24 94         Passed - Valid encounter within last 12 months    Recent Outpatient Visits           2 weeks ago Right ear pain   [place] [place], Angeline ORN, NP   6 months  ago Type 2 diabetes mellitus treated without insulin  [facility])   [place] [facility] [place], [place] W, NP               gabapentin  (NEURONTIN ) 600 MG tablet [Pharmacy Med Name: GABAPENTIN  600 MG Oral Tablet] 270 tablet 3    Sig: TAKE 1 TABLET THREE TIMES DAILY     Neurology: Anticonvulsants - gabapentin  Passed - 07/07/2024 12:48 PM      Passed - Cr in normal range and within 360 days    Creat  Date Value Ref Range Status  07/09/2023 0.82 0.70 - 1.35 mg/dL Final   Creatinine, Ser  Date Value Ref Range Status  02/13/2024 0.68 0.61 - 1.24 mg/dL Final   Creatinine, Urine  Date Value Ref Range Status  04/22/2023 86 20 - 320 mg/dL Final         Passed - Completed PHQ-2 or PHQ-9 in the last 360 days      Passed - Valid encounter within last 12 months    Recent Outpatient Visits           2 weeks ago Right ear pain   [place] [facility] [place], [place] W, NP   6 months ago Type 2 diabetes mellitus treated without insulin  [facility])   [place] [place], Angeline ORN, [place]

## 2024-07-09 ENCOUNTER — Encounter: Admitting: Internal Medicine

## 2024-07-09 NOTE — Progress Notes (Deleted)
 Subjective:    Patient ID: Ricardo FORBES Keller Mickey., male    DOB: September 26, 1955, 68 y.o.   MRN: [id number]  HPI  Patient presents to clinic today for his annual exam.  Flu: 09/2020 Tetanus: Unsure COVID: X 2 Pneumovax: Never Prevnar: Never Shingrix: Never PSA screening: 03/2024 Colon screening: never Vision screening: annually Dentist: as needed, dentures  Diet: He does eat meat. He consumes rare veggies, no fruits. He does eat fried foods. He drinks mostly sweet tea. Exercise: None   Review of Systems     Past Medical History:  Diagnosis Date   Aneurysm of descending thoracic aorta without rupture    a.) s/p TEVAR 12/18/2023 --> 34 x 34 x 15 stent with a 40 x 40 x 20 stent and an additional 40 x 40 x 15 stent component   Aortic atherosclerosis    Atrial fibrillation (HCC)    a.) CHA2DS2VASc = 7 (age, HFrEF, HTN, CVA x 2, vascular disease, T2DM) as of 11/28/2023; b.) rate/rhythm maintained on oral digoxin  + metoprolol  succinate; chronically anticoagulated with apixaban    Atypical mycobacterial infection of lung (HCC)    Bipolar disorder (HCC)    BPH (benign prostatic hyperplasia)    CAD (coronary artery disease)    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    Complication of anesthesia    a.) difficulty maintaining adequate sedation/anesthesia; premature emergence; b.) fentanyl  makes patient mean, grumpy, and difficult   COPD (chronic obstructive pulmonary disease) (HCC)    CVA (cerebral vascular accident) (HCC)    DDD (degenerative disc disease), cervical    a.) s/p ACDF C6-C7   DDD (degenerative disc disease), lumbosacral    Depression    Diverticulosis    Dyspnea    Erectile dysfunction    a.) on PDE5i (sildenafil ) PRN   GERD (gastroesophageal reflux disease)    Headache    Hepatitis    Hepatitis C virus infection cured after antiviral drug therapy    a.) s/p completed Tx course of ledipasvir-sofosbuvir   HFrEF (heart failure with reduced ejection fraction) (HCC)     History of bilateral cataract extraction 03/2022   History of kidney stones 2016   History of TB (tuberculosis)    HTN (hypertension)    Hyperlipidemia    Infrarenal abdominal aortic aneurysm (AAA) without rupture    Myocardial bridge    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     albuterol  (VENTOLIN  HFA) 108 (90 Base) MCG/ACT inhaler INHALE 2 PUFFS INTO THE LUNGS EVERY 6 HOURS AS NEEDED FOR WHEEZING OR SHORTNESS OF BREATH. 8 g 2   apixaban  (ELIQUIS ) 5 MG TABS tablet TAKE 1 TABLET TWICE DAILY 180 tablet 1   aspirin  EC 81 MG tablet Take 1 tablet (81 mg total) by mouth daily. 150 tablet 2   atorvastatin  (LIPITOR ) 80 MG tablet TAKE 1 TABLET EVERY DAY 90 tablet 0   Dexlansoprazole  30 MG capsule DR TAKE 1 CAPSULE (30 MG TOTAL) BY MOUTH IN THE MORNING AND AT BEDTIME. 180 capsule  1   digoxin  (LANOXIN ) 0.25 MG tablet Take 1 tablet (250 mcg total) by mouth daily. 90 tablet 1   DULoxetine  (CYMBALTA ) 60 MG capsule TAKE 1 CAPSULE EVERY DAY 90 capsule 0   empagliflozin  (JARDIANCE ) 10 MG TABS tablet TAKE 1 TABLET EVERY DAY 90 tablet 1   famotidine  (PEPCID ) 40 MG tablet TAKE 1 TABLET EVERY DAY 90 tablet 3   finasteride  (PROSCAR ) 5 MG tablet Take 1 tablet (5 mg total) by mouth daily. 30 tablet 11   Fluticasone-Umeclidin-Vilant (TRELEGY ELLIPTA ) 200-62.5-25 MCG/ACT AEPB Inhale 1 puff into the lungs daily. 60 each 11   gabapentin  (NEURONTIN ) 300 MG capsule TAKE 1 CAPSULE AT BEDTIME (IN ADDITION TO 600MG  FOR A TOTAL OF 900MG  AT BEDTIME) 90 capsule 1   gabapentin  (NEURONTIN ) 600 MG tablet TAKE 1 TABLET THREE  TIMES DAILY 270 tablet 1   hydrocortisone 1 % ointment Apply 1 Application topically as needed for itching.     metoprolol  succinate (TOPROL -XL) 25 MG 24 hr tablet TAKE 1 TABLET EVERY DAY 90 tablet 1   neomycin -polymyxin-hydrocortisone (CORTISPORIN) OTIC solution Place 4 drops into the right ear 4 (four) times daily. 10 mL 0   sildenafil  (VIAGRA ) 50 MG tablet TAKE 1 TABLET EVERY DAY AS NEEDED FOR ERECTILE DYSFUNCTION 18 tablet 3   spironolactone  (ALDACTONE ) 25 MG tablet TAKE 1/2 TABLET AS DIRECTED AS NEEDED 45 tablet 0   tamsulosin  (FLOMAX ) 0.4 MG CAPS capsule Take 1 capsule (0.4 mg total) by mouth daily. 30 capsule 11   varenicline  (CHANTIX ) 1 MG tablet TAKE 1/2 TABLET DAILY FOR 3 DAYS, 1/2 TABLET TWICE DAILY FOR 4 DAYS, THEN TAKE 1 TABLET TWICE DAILY 53 tablet 11   No current facility-administered medications for this visit.    No Known Allergies   Family History  Problem Relation Age of Onset   Cancer Mother        liver   Heart disease Father    Diabetes Father    Breast cancer Sister    Brain cancer Sister     Social History   Socioeconomic History   Marital status: Married    Spouse name: Not on file   Number of children: Not on file   Years of education: Not on file   Highest education level: Some college, no degree  Occupational History   Not on file  Tobacco Use   Smoking status: Every Day    Current packs/day: 3.00    Average packs/day: 3.0 packs/day for 10.8 years (32.5 ttl pk-yrs)    Types: Cigarettes    Start date: 09/03/2013   Smokeless tobacco: Never   Tobacco comments:    Smoking 4 cigarettes daily 09/20/2023    Smoking 2 -3 daily - 11/26/23  Vaping Use   Vaping status: Former  Substance and Sexual Activity   Alcohol  use: Not Currently    Alcohol /week: 2.0 standard drinks of alcohol     Types: 2 Shots of liquor per week    Comment: occasional (every 3-4 weeks)    Drug use: Yes    Types: Cocaine , Heroin, Marijuana, LSD    Comment: polysubstance approx  2 years ago   Sexual activity: Not Currently  Other Topics Concern   Not on file  Social History Narrative   W[place] live out the country, lives with son, Amarien Carne, III.   Social Drivers of Health   Financial Resource Strain: Low Risk  (02/28/2024)   Overall Financial Resource Strain (CARDIA)    Difficulty of Paying Living Expenses: Not  hard at all  Food Insecurity: No Food Insecurity (02/28/2024)   Hunger Vital Sign    Worried About Running Out of Food in the Last Year: Never true    Ran Out of Food in the Last Year: Never true  Transportation Needs: No Transportation Needs (02/28/2024)   PRAPARE - Administrator, Civil Service (Medical): No    Lack of Transportation (Non-Medical): No  Physical Activity: Inactive (02/28/2024)   Exercise Vital Sign    Days of Exercise per Week: 0 days    Minutes of Exercise per Session: 0 min  Stress: No Stress Concern Present (02/28/2024)   [employer] of Occupational Health - Occupational Stress Questionnaire    Feeling of Stress: Only a little  Social Connections: Moderately Isolated (02/28/2024)   Social Connection and Isolation Panel    Frequency of Communication with Friends and Family: More than three times a week    Frequency of Social Gatherings with Friends and Family: More than three times a week    Attends Religious Services: Never    Database Administrator or Organizations: No    Attends Banker Meetings: Never    Marital Status: Living with partner  Intimate Partner Violence: Not At Risk (02/28/2024)   Humiliation, Afraid, Rape, and Kick questionnaire    Fear of Current or Ex-Partner: No    Emotionally Abused: No    Physically Abused: No    Sexually Abused: No     Constitutional: Denies fever, malaise, fatigue, headache or abrupt weight changes.  HEENT: Denies eye pain, eye redness, ear pain, ringing in the ears, wax buildup, runny nose, nasal congestion, bloody nose, or sore throat. Respiratory:  Denies difficulty breathing, shortness of breath, cough or sputum production.   Cardiovascular: Denies chest pain, chest tightness, palpitations or swelling in the hands or feet.  Gastrointestinal: Patient reports nausea, reflux.  Denies abdominal pain, bloating, constipation, diarrhea or blood in the stool.  GU: Patient reports erectile dysfunction.  Denies urgency, frequency, pain with urination, burning sensation, blood in urine, odor or discharge. Musculoskeletal: Denies decrease in range of motion, difficulty with gait, muscle pain or joint pain and swelling.  Skin: Denies redness, rashes, lesions or ulcercations.  Neurological: Patient reports neuropathic pain.  Denies dizziness, difficulty with memory, difficulty with speech or problems with balance and coordination.  Psych: Patient has a history of depression.  Denies anxiety, SI/HI.  No other specific complaints in a complete review of systems (except as listed in HPI above).  Objective:   Physical Exam  There were no vitals taken for this visit.  Wt Readings from Last 3 Encounters:  06/30/24 164 lb 6.4 oz (74.6 kg)  06/23/24 161 lb 9.6 oz (73.3 kg)  03/23/24 165 lb (74.8 kg)    General: Appears his stated age, well developed, well nourished in NAD. Skin: Warm, dry and intact. No ulcerations noted. HEENT: Head: normal shape and size; Eyes: sclera white, no icterus, conjunctiva pink, PERRLA and EOMs intact;  Neck:  Neck supple, trachea midline. No masses, lumps or thyromegaly present.  Cardiovascular: Normal rate and rhythm. S1,S2 noted.  No murmur, rubs or gallops noted. No JVD or BLE edema. Vascular changes noted of BLE. No carotid bruits noted. Pulmonary/Chest: Normal effort and positive vesicular breath sounds. No respiratory distress. No wheezes, rales or ronchi noted.  Abdomen: Soft and nontender. Normal bowel sounds.  Musculoskeletal: Strength 5/5 BUE/BLE. Gait slow and steady with use of cane.  Neurological: Alert and  oriented.  Cranial nerves II-XII grossly intact. Coordination normal.  Psychiatric: Mood and affect normal. Behavior is normal. Judgment and thought content normal.     BMET    Component Value Date/Time   NA 135 02/13/2024 0339   NA 137 11/14/2020 1409   K 4.2 02/13/2024 0339   CL 101 02/13/2024 0339   CO2 26 02/13/2024 0339   GLUCOSE 184 (H) 02/13/2024 0339   BUN 17 02/13/2024 0339   BUN 15 11/14/2020 1409   CREATININE 0.68 02/13/2024 0339   CREATININE 0.82 07/09/2023 1341   CALCIUM  8.8 (L) 02/13/2024 0339   GFRNONAA >60 02/13/2024 0339   GFRNONAA 94 12/18/2017 1137   GFRAA 109 12/18/2017 1137    Lipid Panel     Component Value Date/Time   CHOL 157 01/07/2024 1340   CHOL 217 (H) 12/23/2015 1606   TRIG 126 01/07/2024 1340   HDL 36 (L) 01/07/2024 1340   HDL 29 (L) 12/23/2015 1606   CHOLHDL 4.4 01/07/2024 1340   LDLCALC 98 01/07/2024 1340    CBC    Component Value Date/Time   WBC 8.8 02/13/2024 0339   RBC 4.02 (L) 02/13/2024 0339   HGB 11.9 (L) 02/13/2024 0339   HGB 12.9 (L) 11/14/2020 1409   HCT 38.0 (L) 02/13/2024 0339   HCT 40.0 11/14/2020 1409   PLT 185 02/13/2024 0339   PLT 287 11/14/2020 1409   MCV 94.5 02/13/2024 0339   MCV 95 11/14/2020 1409   MCH 29.6 02/13/2024 0339   MCHC 31.3 02/13/2024 0339   RDW 15.8 (H) 02/13/2024 0339   RDW 14.4 11/14/2020 1409   LYMPHSABS 1.0 02/06/2024 0927   LYMPHSABS 0.9 11/14/2020 1409   MONOABS 0.6 02/06/2024 0927   EOSABS 0.1 02/06/2024 0927   EOSABS 0.2 11/14/2020 1409   BASOSABS 0.1 02/06/2024 0927   BASOSABS 0.0 11/14/2020 1409    Hgb A1C Lab Results  Component Value Date   HGBA1C 6.8 (H) 01/07/2024           Assessment & Plan:   Preventative health maintenance:  Flu shot declined He declines tetanus for financial reasons, advised him if he gets better cut to go get this done Encouraged him to get his COVID booster He declines pneumovax and prevnar Discussed Shingrix vaccine, he will check  coverage with his insurance company and schedule visit if he would like to have this done He declines colonoscopy or cologuard at this time Encouraged him to consume a balanced diet and exercise regimen Advised him to see an eye doctor and dentist annually We will check CBC, c-Met, lipid, A1c and urine microalbumin today  RTC in 6 months, follow-up chronic conditions Angeline Laura, NP

## 2024-07-14 ENCOUNTER — Ambulatory Visit
Admission: RE | Admit: 2024-07-14 | Discharge: 2024-07-14 | Disposition: A | Discharge status: Home / Self Care | Source: Ambulatory Visit | Attending: Pulmonary Disease | Admitting: Pulmonary Disease

## 2024-07-14 DIAGNOSIS — A310 Pulmonary mycobacterial infection: Secondary | ICD-10-CM | POA: Diagnosis not present

## 2024-07-14 DIAGNOSIS — J439 Emphysema, unspecified: Secondary | ICD-10-CM | POA: Diagnosis not present

## 2024-07-14 DIAGNOSIS — J984 Other disorders of lung: Secondary | ICD-10-CM | POA: Insufficient documentation

## 2024-07-14 DIAGNOSIS — R918 Other nonspecific abnormal finding of lung field: Secondary | ICD-10-CM | POA: Diagnosis not present

## 2024-07-17 ENCOUNTER — Ambulatory Visit: Payer: Self-pay | Admitting: Pulmonary Disease

## 2024-07-17 DIAGNOSIS — J449 Chronic obstructive pulmonary disease, unspecified: Secondary | ICD-10-CM

## 2024-07-17 NOTE — Telephone Encounter (Signed)
-----   Message from Dedra Sanders sent at 07/17/2024 10:28 AM EST ----- His chest x-ray does not show any change on his cavitary lesion.  As noted this is likely related to past infection.  His emphysema is quite advanced.  We will need to get updated PFTs to further  evaluate the severity of his emphysema.  At this point this does not appear to be an immediate need for bronchoscopy given that his chest findings are stable. ----- Message ----- From: Interface, Rad Results In Sent: 07/16/2024   9:13 PM EST To: Dedra LITTIE Sanders, MD

## 2024-07-27 ENCOUNTER — Other Ambulatory Visit: Payer: Self-pay | Admitting: Internal Medicine

## 2024-07-28 ENCOUNTER — Other Ambulatory Visit (INDEPENDENT_AMBULATORY_CARE_PROVIDER_SITE_OTHER): Payer: Self-pay | Admitting: Vascular Surgery

## 2024-07-28 DIAGNOSIS — I7143 Infrarenal abdominal aortic aneurysm, without rupture: Secondary | ICD-10-CM

## 2024-07-28 DIAGNOSIS — Z9889 Other specified postprocedural states: Secondary | ICD-10-CM

## 2024-07-28 NOTE — Telephone Encounter (Signed)
 Requested medication (s) are due for refill today: na   Requested medication (s) are on the active medication list: yes   Last refill:  gabapentin - 03/19/24 #90 1 refills, spironolactone - 07/07/24 #45 0 refills  Future visit scheduled: no   Notes to clinic:  pharmacy requesting 3 refills on medications. Do you want to refill each Rx for 3 more refills?     Requested Prescriptions  Pending Prescriptions Disp Refills   gabapentin  (NEURONTIN ) 300 MG capsule [Pharmacy Med Name: GABAPENTIN  300 MG Oral Capsule] 90 capsule 3    Sig: TAKE 1 CAPSULE AT BEDTIME (IN ADDITION TO 600MG  FOR A TOTAL OF 900MG  AT BEDTIME)     Neurology: Anticonvulsants - gabapentin  Passed - 07/28/2024  1:56 PM      Passed - Cr in normal range and within 360 days    Creat  Date Value Ref Range Status  07/09/2023 0.82 0.70 - 1.35 mg/dL Final   Creatinine, Ser  Date Value Ref Range Status  02/13/2024 0.68 0.61 - 1.24 mg/dL Final   Creatinine, Urine  Date Value Ref Range Status  04/22/2023 86 20 - 320 mg/dL Final         Passed - Completed PHQ-2 or PHQ-9 in the last 360 days      Passed - Valid encounter within last 12 months    Recent Outpatient Visits           1 month ago Right ear pain   [place] [facility] [place], Angeline ORN, NP   6 months ago Type 2 diabetes mellitus treated without insulin  [facility])   [place] [facility] [place], [place], NP               spironolactone  (ALDACTONE ) 25 MG tablet [Pharmacy Med Name: SPIRONOLACTONE  25 MG Oral Tablet] 45 tablet 3    Sig: TAKE 1/2 TABLET AS DIRECTED AS NEEDED     Cardiovascular: Diuretics - Aldosterone Antagonist Passed - 07/28/2024  1:56 PM      Passed - Cr in normal range and within 180 days    Creat  Date Value Ref Range Status  07/09/2023 0.82 0.70 - 1.35 mg/dL Final   Creatinine, Ser  Date Value Ref Range Status  02/13/2024 0.68 0.61 - 1.24 mg/dL Final   Creatinine, Urine  Date Value Ref Range Status   04/22/2023 86 20 - 320 mg/dL Final         Passed - K in normal range and within 180 days    Potassium  Date Value Ref Range Status  02/13/2024 4.2 3.5 - 5.1 mmol/L Final         Passed - Na in normal range and within 180 days    Sodium  Date Value Ref Range Status  02/13/2024 135 135 - 145 mmol/L Final  11/14/2020 137 134 - 144 mmol/L Final         Passed - eGFR is 30 or above and within 180 days    GFR, Est African American  Date Value Ref Range Status  12/18/2017 109 > OR = 60 mL/min/1.3m2 Final   GFR, Est Non African American  Date Value Ref Range Status  12/18/2017 94 > OR = 60 mL/min/1.83m2 Final   GFR, Estimated  Date Value Ref Range Status  02/13/2024 >60 >60 mL/min Final    Comment:    (NOTE) Calculated using the CKD-EPI Creatinine Equation (2021)    eGFR  Date Value Ref Range Status  07/09/2023 97 >  OR = 60 mL/min/1.41m2 Final  11/14/2020 105 >59 mL/min/1.73 Final         Passed - Last BP in normal range    BP Readings from Last 1 Encounters:  06/30/24 126/76         Passed - Valid encounter within last 6 months    Recent Outpatient Visits           1 month ago Right ear pain   [facility] [place], [place] W, NP   6 months ago Type 2 diabetes mellitus treated without insulin  [facility])   [place] [facility] [place], Angeline ORN, [place]

## 2024-08-03 ENCOUNTER — Encounter (INDEPENDENT_AMBULATORY_CARE_PROVIDER_SITE_OTHER): Payer: Self-pay | Admitting: Vascular Surgery

## 2024-08-03 ENCOUNTER — Ambulatory Visit (INDEPENDENT_AMBULATORY_CARE_PROVIDER_SITE_OTHER): Admitting: Vascular Surgery

## 2024-08-03 ENCOUNTER — Ambulatory Visit (INDEPENDENT_AMBULATORY_CARE_PROVIDER_SITE_OTHER)

## 2024-08-03 ENCOUNTER — Other Ambulatory Visit: Payer: Self-pay

## 2024-08-03 VITALS — BP 106/66 | HR 77 | Resp 18 | Wt 169.4 lb

## 2024-08-03 DIAGNOSIS — I7143 Infrarenal abdominal aortic aneurysm, without rupture: Secondary | ICD-10-CM

## 2024-08-03 DIAGNOSIS — I739 Peripheral vascular disease, unspecified: Secondary | ICD-10-CM | POA: Diagnosis not present

## 2024-08-03 DIAGNOSIS — Z9889 Other specified postprocedural states: Secondary | ICD-10-CM | POA: Diagnosis not present

## 2024-08-03 DIAGNOSIS — I482 Chronic atrial fibrillation, unspecified: Secondary | ICD-10-CM

## 2024-08-03 DIAGNOSIS — J449 Chronic obstructive pulmonary disease, unspecified: Secondary | ICD-10-CM | POA: Diagnosis not present

## 2024-08-03 DIAGNOSIS — I712 Thoracic aortic aneurysm, without rupture, unspecified: Secondary | ICD-10-CM | POA: Diagnosis not present

## 2024-08-03 DIAGNOSIS — I70213 Atherosclerosis of native arteries of extremities with intermittent claudication, bilateral legs: Secondary | ICD-10-CM | POA: Diagnosis not present

## 2024-08-03 LAB — VAS US ABI WITH/WO TBI
Left ABI: 0.68
Right ABI: 0.7

## 2024-08-03 NOTE — Progress Notes (Signed)
 MRN : [id number]  Ricardo Cervantes. is a 68 y.o. ([birth year]) male who presents with chief complaint of check circulation.  History of Present Illness:   The patient returns to the office for surveillance of an abdominal aortic aneurysm status post stent graft placement.   Procedure: February 12, 2024: Placement of a C3 32 x 14 x 14 Gore Excluder Endoprosthesis main body with a 20 x 10 ipsilateral extender limb and a 20 x 12 contralateral limb   December 18, 2023 2.  Placement of a Gore TAG conformable thoracic stent graft in the descending thoracic aorta, using a 34 x 34 x 15 stent with a 40 x 40 x 20 stent and an additional 40 x 40 x 15 stent components  Patient denies abdominal pain or back pain, no other abdominal complaints.  No symptoms consistent with distal embolization No changes in claudication distance or new rest pain symptoms. No interval development of new ulcers or wounds  There have been no significant interval changes in his overall healthcare since his last visit.   Patient denies amaurosis fugax or TIA symptoms.  The patient denies recent episodes of angina or shortness of breath.   Duplex US  of the aorta and iliac arteries shows a 4.8 AAA sac with no endoleak, decrease in the sac compared to the previous study.  ABI's done today Rt=0.70 (TBI=0.58) and Lt=0.68 (TBI=0.55).  Previous ABI Rt=1.02 (TBI=0.62) and Lt=1.08 (TBI=0.84).  CT scan of the chest dated July 14, 2024 is reviewed by me personally and demonstrates a well placed thoracic stent graft with no endoleak.  The sac now measures 5.3 cm, previous sac measured 6.1 cm.  No outpatient medications have been marked as taking for the 08/03/24 encounter (Office Visit) with Jama, Cordella MATSU, MD.    Past Medical History:  Diagnosis Date   Aneurysm of descending thoracic aorta without rupture    a.) s/p TEVAR 12/18/2023 --> 34 x 34 x 15 stent  with a 40 x 40 x 20 stent and an additional 40 x 40 x 15 stent component   Aortic atherosclerosis    Atrial fibrillation (HCC)    a.) CHA2DS2VASc = 7 (age, HFrEF, HTN, CVA x 2, vascular disease, T2DM) as of 11/28/2023; b.) rate/rhythm maintained on oral digoxin  + metoprolol  succinate; chronically anticoagulated with apixaban    Atypical mycobacterial infection of lung (HCC)    Bipolar disorder (HCC)    BPH (benign prostatic hyperplasia)    CAD (coronary artery disease)    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    Complication of anesthesia    a.) difficulty maintaining adequate sedation/anesthesia; premature emergence; b.) fentanyl  makes patient mean, grumpy, and difficult   COPD (chronic obstructive pulmonary disease) (HCC)    CVA (cerebral vascular accident) (HCC)    DDD (degenerative disc disease), cervical    a.) s/p ACDF C6-C7   DDD (degenerative disc disease), lumbosacral    Depression    Diverticulosis    Dyspnea    Erectile dysfunction    a.) on PDE5i (sildenafil ) PRN   GERD (gastroesophageal reflux disease)  Headache    Hepatitis    Hepatitis C virus infection cured after antiviral drug therapy    a.) s/p completed Tx course of ledipasvir-sofosbuvir   HFrEF (heart failure with reduced ejection fraction) (HCC)    History of bilateral cataract extraction 03/2022   History of kidney stones 2016   History of TB (tuberculosis)    HTN (hypertension)    Hyperlipidemia    Infrarenal abdominal aortic aneurysm (AAA) without rupture    Myocardial bridge    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   On apixaban  therapy    Pericarditis    Peripheral vascular disease    Pneumonia    Protein calorie malnutrition    RA (rheumatoid arthritis) (HCC)    Smoker    T2DM (type 2 diabetes mellitus) (HCC)    Thoracic scoliosis     Past Surgical History:  Procedure Laterality Date   ABDOMINAL AORTOGRAM W/LOWER EXTREMITY N/A 01/15/2017   Procedure: Abdominal Aortogram  w/Lower Extremity;  Surgeon: Jama Cordella MATSU, MD;  Location: [facility];  Service: Cardiovascular;  Laterality: N/A;   ANTERIOR CERVICAL DECOMP/DISCECTOMY FUSION N/A 2004   ENDOVASCULAR STENT GRAFT (AAA) N/A 02/12/2024   Procedure: ENDOVASCULAR REPAIR/STENT GRAFT;  Surgeon: Jama Cordella MATSU, MD;  Location: [facility];  Service: Cardiovascular;  Laterality: N/A;   FEMORAL-POPLITEAL BYPASS GRAFT Right 06/19/2017   Procedure: BYPASS GRAFT FEMORAL-POPLITEAL ARTERY;  Surgeon: Jama Cordella MATSU, MD;  Location: [facility];  Service: Vascular;  Laterality: Right;   FEMORAL-POPLITEAL BYPASS GRAFT     had 2 on right leg , 1 on the left   fractured nose repair     as a child   liver abcess removed      LOWER EXTREMITY ANGIOGRAPHY Right 01/15/2017   Procedure: Lower Extremity Angiography;  Surgeon: Jama Cordella MATSU, MD;  Location: [facility];  Service: Cardiovascular;  Laterality: Right;   LOWER EXTREMITY ANGIOGRAPHY Right 02/13/2017   Procedure: Lower Extremity Angiography;  Surgeon: Jama Cordella MATSU, MD;  Location: [facility];  Service: Cardiovascular;  Laterality: Right;   PLACEMENT OF LUMBAR DRAIN N/A 12/18/2023   Procedure: PLACEMENT OF LUMBAR DRAIN;  Surgeon: Claudene Penne ORN, MD;  Location: [facility];  Service: Neurosurgery;  Laterality: N/A;   THORACIC AORTIC ENDOVASCULAR STENT GRAFT N/A 12/18/2023   Procedure: THORACIC AORTIC ENDOVASCULAR STENT GRAFT;  Surgeon: Jama Cordella MATSU, MD;  Location: [facility];  Service: Cardiovascular;  Laterality: N/A;   VASECTOMY N/A     Social History Social History   Tobacco Use   Smoking status: Every Day    Current packs/day: 3.00    Average packs/day: 3.0 packs/day for 10.9 years (32.7 ttl pk-yrs)    Types: Cigarettes    Start date: 09/03/2013   Smokeless tobacco: Never   Tobacco comments:    Smoking 4 cigarettes daily 09/20/2023    Smoking 2 -3 daily - 11/26/23  Vaping Use   Vaping  status: Former  Substance Use Topics   Alcohol  use: Not Currently    Alcohol /week: 2.0 standard drinks of alcohol     Types: 2 Shots of liquor per week    Comment: occasional (every 3-4 weeks)    Drug use: Yes    Types: Cocaine , Heroin, Marijuana, LSD    Comment: polysubstance approx 2 years ago    Family History Family History  Problem Relation Age of Onset   Cancer Mother  liver   Heart disease Father    Diabetes Father    Breast cancer Sister    Brain cancer Sister     No Known Allergies   REVIEW OF SYSTEMS (Negative unless checked)  Constitutional: [] Weight loss  [] Fever  [] Chills Cardiac: [] Chest pain   [] Chest pressure   [] Palpitations   [] Shortness of breath when laying flat   [] Shortness of breath with exertion. Vascular:  [x] Pain in legs with walking   [] Pain in legs at rest  [] History of DVT   [] Phlebitis   [] Swelling in legs   [] Varicose veins   [] Non-healing ulcers Pulmonary:   [] Uses home oxygen   [] Productive cough   [] Hemoptysis   [] Wheeze  [] COPD   [] Asthma Neurologic:  [] Dizziness   [] Seizures   [] History of stroke   [] History of TIA  [] Aphasia   [] Vissual changes   [] Weakness or numbness in arm   [] Weakness or numbness in leg Musculoskeletal:   [] Joint swelling   [x] Joint pain   [] Low back pain Hematologic:  [] Easy bruising  [] Easy bleeding   [] Hypercoagulable state   [] Anemic Gastrointestinal:  [] Diarrhea   [] Vomiting  [x] Gastroesophageal reflux/heartburn   [] Difficulty swallowing. Genitourinary:  [] Chronic kidney disease   [] Difficult urination  [] Frequent urination   [] Blood in urine Skin:  [] Rashes   [] Ulcers  Psychological:  [] History of anxiety   [x]  History of major depression.  Physical Examination  There were no vitals filed for this visit. There is no height or weight on file to calculate BMI. Gen: WD/WN, NAD Head: /AT, No temporalis wasting.  Ear/Nose/Throat: Hearing grossly intact, nares w/o erythema or drainage Eyes: PER, EOMI,  sclera nonicteric.  Neck: Supple, no masses.  No bruit or JVD.  Pulmonary:  Good air movement, no audible wheezing, no use of accessory muscles.  Cardiac: RRR, normal S1, S2, no Murmurs. Vascular:  mild trophic changes, no open wounds Vessel Right Left  Radial Palpable Palpable  PT Not Palpable Not Palpable  DP Not Palpable Not Palpable  Gastrointestinal: soft, non-distended. No guarding/no peritoneal signs.  Musculoskeletal: M/S 5/5 throughout.  No visible deformity.  Neurologic: CN 2-12 intact. Pain and light touch intact in extremities.  Symmetrical.  Speech is fluent. Motor exam as listed above. Psychiatric: Judgment intact, Mood & affect appropriate for pt's clinical situation. Dermatologic: No rashes or ulcers noted.  No changes consistent with cellulitis.   CBC Lab Results  Component Value Date   WBC 8.8 02/13/2024   HGB 11.9 (L) 02/13/2024   HCT 38.0 (L) 02/13/2024   MCV 94.5 02/13/2024   PLT 185 02/13/2024    BMET    Component Value Date/Time   NA 135 02/13/2024 0339   NA 137 11/14/2020 1409   K 4.2 02/13/2024 0339   CL 101 02/13/2024 0339   CO2 26 02/13/2024 0339   GLUCOSE 184 (H) 02/13/2024 0339   BUN 17 02/13/2024 0339   BUN 15 11/14/2020 1409   CREATININE 0.68 02/13/2024 0339   CREATININE 0.82 07/09/2023 1341   CALCIUM  8.8 (L) 02/13/2024 0339   GFRNONAA >60 02/13/2024 0339   GFRNONAA 94 12/18/2017 1137   GFRAA 109 12/18/2017 1137   CrCl cannot be calculated (Patient's most recent lab result is older than the maximum 21 days allowed.).  COAG Lab Results  Component Value Date   INR 1.2 02/12/2024   INR 1.2 12/18/2023   INR 1.10 06/13/2017    Radiology CT SUPER D CHEST WO MONARCH PILOT Result Date: 07/16/2024 EXAM: CT CHEST WITHOUT  CONTRAST 07/14/2024 03:01:23 PM TECHNIQUE: CT of the chest was performed without the administration of intravenous contrast. Multiplanar reformatted images are provided for review. Automated exposure control, iterative  reconstruction, and/or weight based adjustment of the mA/kV was utilized to reduce the radiation dose to as low as reasonably achievable. COMPARISON: CT Chest 12/10/2023 and CT 01/06/2024. CLINICAL HISTORY: Cavitary lesion of lung, atypical mycobacterial infection of lung (HCC). FINDINGS: MEDIASTINUM: Heart and pericardium are unremarkable. Coronary artery and aortic atherosclerotic calcification. Postoperative change of descending thoracic aortic aneurysm repair. Similar size of the excluded aneurysm in the descending thoracic aorta now measuring 5.3 cm. The central airways are clear. LYMPH NODES: No mediastinal, hilar or axillary lymphadenopathy. LUNGS AND PLEURA: Advanced emphysema with bullous change in the upper lobes and right middle lobe. Cavitary left upper lobe pulmonary lesion measures 4.0 x 3.6 cm and is not substantially changed from prior. Adjacent left upper lobe nodule measuring 11 mm on series 3 image 44 is also unchanged. Unchanged 6 mm nodule in the left lower lobe on series 3 image 122. Nodular scarring in the right upper lobe on series 3 image 62 is unchanged measuring 1.8 x 0.9 cm. No pulmonary edema. No pleural effusion or pneumothorax. SOFT TISSUES/BONES: No acute abnormality of the bones or soft tissues. UPPER ABDOMEN: Limited images of the upper abdomen demonstrates no acute abnormality. IMPRESSION: 1. Cavitary left upper lobe pulmonary lesion measuring 4.0 x 3.6 cm, unchanged from prior; findings compatible with atypical mycobacterial infection. Continued surveillance is recommended. 2. Multiple additional pulmonary nodules are unchanged. Advanced emphysema with upper-lobepredominant bullous change; recommend evaluation for low-dose CT lung cancer screening if not already enrolled. 3. Postoperative change of descending thoracic aortic aneurysm repair with similar size of excluded aneurysm measuring 5.3 cm in the descending thoracic aorta. Electronically signed by: Norman Gatlin MD  07/16/2024 09:12 PM EST RP Workstation: [id number]     Assessment/Plan 1. Infrarenal abdominal aortic aneurysm (AAA) without rupture (Primary) Recommend:  Patient is status post successful endovascular repair of the AAA.   No further intervention is required at this time.   No endoleak is detected and the aneurysm sac is stable.  The patient will continue antiplatelet therapy as prescribed as well as aggressive management of hyperlipidemia. Exercise is encouraged.   However, endografts require continued surveillance with ultrasound or CT scan. This is mandatory to detect any changes that allow repressurization of the aneurysm sac.  The patient is informed that this would be asymptomatic.  The patient is reminded that lifelong routine surveillance is a necessity with an endograft. Patient will continue to follow-up at the specified interval with ultrasound of the aorta. - VAS US  AORTA/IVC/ILIACS; Future  2. Thoracic aortic aneurysm without rupture, unspecified part Recommend:  No surgery or intervention is indicated at this time.  The patient has an asymptomatic thoracic aortic aneurysm that is less than 6.0 cm in maximal diameter.  I have discussed the natural history of thoracic aortic aneurysm and the small risk of rupture for aneurysm less than 6.5 cm in size.  However, as these small aneurysms tend to enlarge over time, continued surveillance with CT scan is mandatory.   I have also discussed optimizing medical management with hypertension and lipid control and the negative effect that any tobacco products have on aneurysmal disease.  The patient is also encouraged to exercise a minimum of 30 minutes 4 times a week.   Should the patient develop new onset chest or back pain or signs of peripheral embolization they are  instructed to seek medical attention immediately and to alert the physician providing care that they have an aneurysm in the chest.   The patient voices their  understanding.  The patient will return as ordered with a CT scan of the chest  3. Atherosclerosis of native artery of both lower extremities with intermittent claudication  Recommend:  The patient has evidence of atherosclerosis of the lower extremities with claudication.  The patient does not voice lifestyle limiting changes at this point in time.  Noninvasive studies do suggest a mild decrease.  I did discuss with the patient the possibility of angiography with the hope for intervention.  I also informed him that this would begin with a CT angiogram with bilateral lower extremity runoff to better plan the intervention given the stent graft placement.  At this time he wishes to continue walking and is anxious to return to [place] for 6 months and will follow-up with me in approximately 8 months when he returns.  No invasive studies, angiography or surgery at this time The patient should continue walking and begin a more formal exercise program.  The patient should continue antiplatelet therapy and aggressive treatment of the lipid abnormalities  No changes in the patient's medications at this time  Continued surveillance is indicated as atherosclerosis is likely to progress with time.    The patient will continue follow up with noninvasive studies as ordered.  - VAS US  ABI WITH/WO TBI; Future  4. Chronic atrial fibrillation (HCC) Continue antiarrhythmia medications as already ordered, these medications have been reviewed and there are no changes at this time.  Continue anticoagulation as ordered by Cardiology Service  5. Stage 3 severe COPD by GOLD classification (HCC) Continue pulmonary medications and aerosols as already ordered, these medications have been reviewed and there are no changes at this time.  d   Cordella Shawl, MD  08/03/2024 8:33 AM

## 2024-08-07 ENCOUNTER — Other Ambulatory Visit: Payer: Self-pay | Admitting: Internal Medicine

## 2024-08-10 ENCOUNTER — Other Ambulatory Visit: Payer: Self-pay | Admitting: Internal Medicine

## 2024-08-10 DIAGNOSIS — H60541 Acute eczematoid otitis externa, right ear: Secondary | ICD-10-CM

## 2024-08-11 DIAGNOSIS — I480 Paroxysmal atrial fibrillation: Secondary | ICD-10-CM | POA: Diagnosis not present

## 2024-08-11 DIAGNOSIS — I716 Thoracoabdominal aortic aneurysm, without rupture, unspecified: Secondary | ICD-10-CM | POA: Diagnosis not present

## 2024-08-11 DIAGNOSIS — I714 Abdominal aortic aneurysm, without rupture, unspecified: Secondary | ICD-10-CM | POA: Diagnosis not present

## 2024-08-11 DIAGNOSIS — I50810 Right heart failure, unspecified: Secondary | ICD-10-CM | POA: Diagnosis not present

## 2024-08-11 DIAGNOSIS — I5020 Unspecified systolic (congestive) heart failure: Secondary | ICD-10-CM | POA: Diagnosis not present

## 2024-08-11 NOTE — Telephone Encounter (Signed)
 Too soon for refill.  Requested Prescriptions  Pending Prescriptions Disp Refills   spironolactone  (ALDACTONE ) 25 MG tablet [Pharmacy Med Name: SPIRONOLACTONE  25 MG Oral Tablet] 45 tablet 3    Sig: TAKE 1/2 TABLET AS DIRECTED AS NEEDED     Cardiovascular: Diuretics - Aldosterone Antagonist Passed - 08/11/2024  9:24 AM      Passed - Cr in normal range and within 180 days    Creat  Date Value Ref Range Status  07/09/2023 0.82 0.70 - 1.35 mg/dL Final   Creatinine, Ser  Date Value Ref Range Status  02/13/2024 0.68 0.61 - 1.24 mg/dL Final   Creatinine, Urine  Date Value Ref Range Status  04/22/2023 86 20 - 320 mg/dL Final         Passed - K in normal range and within 180 days    Potassium  Date Value Ref Range Status  02/13/2024 4.2 3.5 - 5.1 mmol/L Final         Passed - Na in normal range and within 180 days    Sodium  Date Value Ref Range Status  02/13/2024 135 135 - 145 mmol/L Final  11/14/2020 137 134 - 144 mmol/L Final         Passed - eGFR is 30 or above and within 180 days    GFR, Est African American  Date Value Ref Range Status  12/18/2017 109 > OR = 60 mL/min/1.84m2 Final   GFR, Est Non African American  Date Value Ref Range Status  12/18/2017 94 > OR = 60 mL/min/1.90m2 Final   GFR, Estimated  Date Value Ref Range Status  02/13/2024 >60 >60 mL/min Final    Comment:    (NOTE) Calculated using the CKD-EPI Creatinine Equation (2021)    eGFR  Date Value Ref Range Status  07/09/2023 97 > OR = 60 mL/min/1.51m2 Final  11/14/2020 105 >59 mL/min/1.73 Final         Passed - Last BP in normal range    BP Readings from Last 1 Encounters:  08/03/24 106/66         Passed - Valid encounter within last 6 months    Recent Outpatient Visits           1 month ago Right ear pain   [facility] [place], Angeline ORN, NP   7 months ago Type 2 diabetes mellitus treated without insulin  [facility])   [place] [facility] [place],  [place], [place]               gabapentin  (NEURONTIN ) 300 MG capsule [Pharmacy Med Name: GABAPENTIN  300 MG Oral Capsule] 90 capsule 3    Sig: TAKE 1 CAPSULE AT BEDTIME (IN ADDITION TO 600MG  FOR A TOTAL OF 900MG  AT BEDTIME)     Neurology: Anticonvulsants - gabapentin  Passed - 08/11/2024  9:24 AM      Passed - Cr in normal range and within 360 days    Creat  Date Value Ref Range Status  07/09/2023 0.82 0.70 - 1.35 mg/dL Final   Creatinine, Ser  Date Value Ref Range Status  02/13/2024 0.68 0.61 - 1.24 mg/dL Final   Creatinine, Urine  Date Value Ref Range Status  04/22/2023 86 20 - 320 mg/dL Final         Passed - Completed PHQ-2 or PHQ-9 in the last 360 days      Passed - Valid encounter within last 12 months    Recent Outpatient Visits  1 month ago Right ear pain   [facility] [place], [place] W, NP   7 months ago Type 2 diabetes mellitus treated without insulin  [facility])   [place] [facility] [place], Angeline ORN, [place]

## 2024-08-11 NOTE — Telephone Encounter (Signed)
 Requested Prescriptions  Pending Prescriptions Disp Refills   empagliflozin  (JARDIANCE ) 10 MG TABS tablet [Pharmacy Med Name: JARDIANCE  10 MG Oral Tablet] 90 tablet 0    Sig: TAKE 1 TABLET EVERY DAY     Endocrinology:  Diabetes - SGLT2 Inhibitors Failed - 08/11/2024  4:06 PM      Failed - HBA1C is between 0 and 7.9 and within 180 days    Hgb A1c MFr Bld  Date Value Ref Range Status  01/07/2024 6.8 (H) <5.7 % Final    Comment:    For someone without known diabetes, a hemoglobin A1c value of 6.5% or greater indicates that they may have  diabetes and this should be confirmed with a follow-up  test. . For someone with known diabetes, a value <7% indicates  that their diabetes is well controlled and a value  greater than or equal to 7% indicates suboptimal  control. A1c targets should be individualized based on  duration of diabetes, age, comorbid conditions, and  other considerations. . Currently, no consensus exists regarding use of hemoglobin A1c for diagnosis of diabetes for children. .          Passed - Cr in normal range and within 360 days    Creat  Date Value Ref Range Status  07/09/2023 0.82 0.70 - 1.35 mg/dL Final   Creatinine, Ser  Date Value Ref Range Status  02/13/2024 0.68 0.61 - 1.24 mg/dL Final   Creatinine, Urine  Date Value Ref Range Status  04/22/2023 86 20 - 320 mg/dL Final         Passed - eGFR in normal range and within 360 days    GFR, Est African American  Date Value Ref Range Status  12/18/2017 109 > OR = 60 mL/min/1.49m2 Final   GFR, Est Non African American  Date Value Ref Range Status  12/18/2017 94 > OR = 60 mL/min/1.75m2 Final   GFR, Estimated  Date Value Ref Range Status  02/13/2024 >60 >60 mL/min Final    Comment:    (NOTE) Calculated using the CKD-EPI Creatinine Equation (2021)    eGFR  Date Value Ref Range Status  07/09/2023 97 > OR = 60 mL/min/1.6m2 Final  11/14/2020 105 >59 mL/min/1.73 Final         Passed - Valid  encounter within last 6 months    Recent Outpatient Visits           1 month ago Right ear pain   [facility] [place], Angeline ORN, NP   7 months ago Type 2 diabetes mellitus treated without insulin  [facility])   [place] [facility] [place], [place], NP               apixaban  (ELIQUIS ) 5 MG TABS tablet [Pharmacy Med Name: ELIQUIS  5 MG Oral Tablet] 180 tablet 0    Sig: TAKE 1 TABLET TWICE DAILY     Hematology:  Anticoagulants - apixaban  Failed - 08/11/2024  4:06 PM      Failed - HGB in normal range and within 360 days    Hemoglobin  Date Value Ref Range Status  02/13/2024 11.9 (L) 13.0 - 17.0 g/dL Final  96/85/7977 87.0 (L) 13.0 - 17.7 g/dL Final         Failed - HCT in normal range and within 360 days    HCT  Date Value Ref Range Status  02/13/2024 38.0 (L) 39.0 - 52.0 % Final   Hematocrit  Date Value Ref  Range Status  11/14/2020 40.0 37.5 - 51.0 % Final         Failed - AST in normal range and within 360 days    AST  Date Value Ref Range Status  07/09/2023 20 10 - 35 U/L Final         Failed - ALT in normal range and within 360 days    ALT  Date Value Ref Range Status  07/09/2023 27 9 - 46 U/L Final         Passed - PLT in normal range and within 360 days    Platelets  Date Value Ref Range Status  02/13/2024 185 150 - 400 K/uL Final  11/14/2020 287 150 - 450 x10E3/uL Final         Passed - Cr in normal range and within 360 days    Creat  Date Value Ref Range Status  07/09/2023 0.82 0.70 - 1.35 mg/dL Final   Creatinine, Ser  Date Value Ref Range Status  02/13/2024 0.68 0.61 - 1.24 mg/dL Final   Creatinine, Urine  Date Value Ref Range Status  04/22/2023 86 20 - 320 mg/dL Final         Passed - Valid encounter within last 12 months    Recent Outpatient Visits           1 month ago Right ear pain   [facility] [place], [place] W, NP   7 months ago Type 2 diabetes mellitus treated  without insulin  [facility])   [place] [facility] [place], [place], NP               metoprolol  succinate (TOPROL -XL) 25 MG 24 hr tablet [Pharmacy Med Name: METOPROLOL  SUCCINATE ER 25 MG Oral Tablet Extended Release 24 Hour] 90 tablet 0    Sig: TAKE 1 TABLET EVERY DAY     Cardiovascular:  Beta Blockers Passed - 08/11/2024  4:06 PM      Passed - Last BP in normal range    BP Readings from Last 1 Encounters:  08/03/24 106/66         Passed - Last Heart Rate in normal range    Pulse Readings from Last 1 Encounters:  08/03/24 77         Passed - Valid encounter within last 6 months    Recent Outpatient Visits           1 month ago Right ear pain   [place] [facility] [place], [place] W, NP   7 months ago Type 2 diabetes mellitus treated without insulin  [facility])   [place] [facility] [place], Angeline ORN, [place]

## 2024-08-12 ENCOUNTER — Other Ambulatory Visit: Payer: Self-pay | Admitting: Internal Medicine

## 2024-08-13 ENCOUNTER — Ambulatory Visit: Admitting: Pulmonary Disease

## 2024-08-13 ENCOUNTER — Encounter: Payer: Self-pay | Admitting: Pulmonary Disease

## 2024-08-13 ENCOUNTER — Telehealth: Payer: Self-pay

## 2024-08-13 VITALS — BP 126/80 | HR 74 | Temp 97.7°F | Ht 74.0 in | Wt 172.0 lb

## 2024-08-13 DIAGNOSIS — J449 Chronic obstructive pulmonary disease, unspecified: Secondary | ICD-10-CM | POA: Diagnosis not present

## 2024-08-13 DIAGNOSIS — F17210 Nicotine dependence, cigarettes, uncomplicated: Secondary | ICD-10-CM | POA: Diagnosis not present

## 2024-08-13 DIAGNOSIS — J984 Other disorders of lung: Secondary | ICD-10-CM

## 2024-08-13 DIAGNOSIS — R918 Other nonspecific abnormal finding of lung field: Secondary | ICD-10-CM

## 2024-08-13 DIAGNOSIS — A310 Pulmonary mycobacterial infection: Secondary | ICD-10-CM

## 2024-08-13 DIAGNOSIS — I5020 Unspecified systolic (congestive) heart failure: Secondary | ICD-10-CM

## 2024-08-13 NOTE — Progress Notes (Unsigned)
 Subjective:    Patient ID: Ricardo FORBES Keller Mickey., male    DOB: Feb 27, 1956, 68 y.o.   MRN: [id number]  Patient Care Team: Antonette Angeline ORN, NP as PCP - General (Internal Medicine)  Chief Complaint  Patient presents with   COPD    Shortness of breath on exertion.     BACKGROUND/INTERVAL:68 year old current smoker presents for evaluation of abnormalities noted on lung cancer screening CT.  Patient has a history of ischemic cardiomyopathy, stage III COPD, prior TB treated, atypical Mycobacterium infection and severe peripheral vascular disease.  Was initially evaluated here on 28 Jan 2024.  Last visit was 10 March 2024.  He has just returned last week from a trip to [place].   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 11/26/2023 echocardiogram [facility]): LVEF 45%, mild LVH, grade 1 DD, normal right ventricular systolic function.  Trivial mitral regurg and tricuspid regurg.  No valvular stenosis.  No wall motion abnormalities.  Review of Systems A 10 point review of systems was performed and it is as noted above otherwise negative.   Patient Active Problem List   Diagnosis Date Noted   Thoracic aortic aneurysm without rupture 12/18/2023   AAA (abdominal aortic aneurysm) without rupture 09/29/2023   Depression 04/12/2023   BPH (benign prostatic hyperplasia) 12/14/2021   GERD (gastroesophageal reflux disease) 07/31/2021   DM (diabetes mellitus), type 2 with complications (HCC) 07/31/2021   Stage 3 severe COPD by GOLD classification (HCC) 10/18/2020   HFrEF (heart failure with reduced ejection fraction) (HCC)  10/18/2020   History of stroke with residual effects 10/18/2020   Atrial fibrillation (HCC) 09/04/2020   Hyperlipidemia associated with type 2 diabetes mellitus (HCC) 07/04/2017   Atherosclerosis of native arteries of extremity with intermittent claudication 06/19/2017   Erectile dysfunction 12/23/2015    Social History   Tobacco Use   Smoking status: Every Day    Current packs/day: 3.00    Average packs/day: 3.0 packs/day for 10.9 years (32.8 ttl pk-yrs)    Types: Cigarettes    Start date: 09/03/2013   Smokeless tobacco: Never   Tobacco comments:    Smoking 1 daily - 08/13/24  Substance Use Topics   Alcohol  use: Not Currently    Alcohol /week: 2.0 standard drinks of alcohol     Types: 2 Shots of liquor per week    Comment: occasional (every 3-4 weeks)     Allergies[1]  Active Medications[2]  Immunization History  Administered Date(s) Administered   Influenza-Unspecified 09/16/2020   Moderna Sars-Covid-2 Vaccination 09/14/2020   PFIZER Comirnaty(Gray Top)Covid-19 Tri-Sucrose Vaccine 10/12/2020        Objective:     Vitals:   08/13/24 1322  BP: 126/80  Pulse: 74  Temp: 97.7 F (36.5 C)  Height: 6' 2 (1.88 m)  Weight: 172 lb (78 kg)  SpO2: 95%  TempSrc: Temporal  BMI (Calculated): 22.07     SpO2: 95 %  GENERAL: Thin, chronically ill-appearing gentleman in no acute distress.  He ambulates with the assistance of a cane. HEAD: Normocephalic, atraumatic.  EYES: Pupils equal, round, reactive to light.  No scleral icterus.  MOUTH: Poor dentition, upper dentures, partial lower.  Oral mucosa moist.  No thrush NECK:  Supple. No thyromegaly. Trachea midline. No JVD.  No adenopathy. PULMONARY: Good air entry bilaterally.  Coarse, few scattered rhonchi. CARDIOVASCULAR: S1 and S2. Regular rate and rhythm.  No rubs, murmurs or gallops heard. ABDOMEN: Benign. MUSCULOSKELETAL: No joint deformity, no clubbing, trace edema lower extremities.  NEUROLOGIC: No overt focal  deficit, gait supported by cane.  Speech is fluent. SKIN: Intact,warm,dry.  Cool, dusky feet (chronic). PSYCH: Flat affect and behavior normal.        Assessment & Plan:   No diagnosis found.  No orders of the defined types were placed in this encounter.   No orders of the defined types were placed in this encounter.     Advised if symptoms do not improve or worsen, to please contact office for sooner follow up or seek emergency care.    I spent xxx minutes of dedicated to the care of this patient on the date of this encounter to include pre-visit review of records, face-to-face time with the patient discussing conditions above, post visit ordering of testing, clinical documentation with the electronic health record, making appropriate referrals as documented, and communicating necessary findings to members of the patients care team.     C. Leita Sanders, MD Advanced Bronchoscopy PCCM [facility]    *This note was generated using voice recognition software/Dragon and/or AI transcription program.  Despite best efforts to proofread, errors can occur which can change the meaning. Any transcriptional errors that result from this process are unintentional and may not be fully corrected at the time of dictation.    [1] No Known Allergies [2]  Current Meds  Medication Sig   acetaminophen  (TYLENOL ) 500 MG tablet Take 1,000 mg by mouth every 6 (six) hours as needed.   albuterol  (VENTOLIN  HFA) 108 (90 Base) MCG/ACT inhaler INHALE 2 PUFFS INTO THE LUNGS EVERY 6 HOURS AS NEEDED FOR WHEEZING OR SHORTNESS OF BREATH.   apixaban  (ELIQUIS ) 5 MG TABS tablet TAKE 1 TABLET TWICE DAILY   aspirin  EC 81 MG tablet Take 1 tablet (81 mg total) by mouth daily.   atorvastatin  (LIPITOR ) 80 MG tablet TAKE 1 TABLET EVERY DAY   Dexlansoprazole  30 MG capsule DR TAKE 1 CAPSULE (30 MG TOTAL) BY MOUTH IN THE MORNING AND AT BEDTIME.   digoxin  (LANOXIN ) 0.25 MG tablet Take 1 tablet (250 mcg  total) by mouth daily.   DULoxetine  (CYMBALTA ) 60 MG capsule TAKE 1 CAPSULE EVERY DAY   empagliflozin  (JARDIANCE ) 10 MG TABS tablet TAKE 1 TABLET EVERY DAY   famotidine  (PEPCID ) 40 MG tablet TAKE 1 TABLET EVERY DAY   finasteride  (PROSCAR ) 5 MG tablet Take 1 tablet (5 mg total) by mouth daily.   Fluticasone-Umeclidin-Vilant (TRELEGY ELLIPTA ) 200-62.5-25 MCG/ACT AEPB Inhale 1 puff into the lungs daily.   gabapentin  (NEURONTIN ) 300 MG capsule TAKE 1 CAPSULE AT BEDTIME (IN ADDITION TO 600MG  FOR A TOTAL OF 900MG  AT BEDTIME)   gabapentin  (NEURONTIN ) 600 MG tablet TAKE 1 TABLET THREE TIMES DAILY   hydrocortisone 1 % ointment Apply 1 Application topically as needed for itching.   metoprolol  succinate (TOPROL -XL) 25 MG 24 hr tablet TAKE 1 TABLET EVERY DAY   neomycin -polymyxin-hydrocortisone (CORTISPORIN) OTIC solution Place 4 drops into the right ear 4 (four) times daily.   sildenafil  (VIAGRA ) 50 MG tablet TAKE 1 TABLET EVERY DAY AS NEEDED FOR ERECTILE DYSFUNCTION   spironolactone  (ALDACTONE ) 25 MG tablet TAKE 1/2 TABLET AS DIRECTED AS NEEDED   tamsulosin  (FLOMAX ) 0.4 MG CAPS capsule Take 1 capsule (0.4 mg total) by mouth  daily.   varenicline  (CHANTIX ) 1 MG tablet TAKE 1/2 TABLET DAILY FOR 3 DAYS, 1/2 TABLET TWICE DAILY FOR 4 DAYS, THEN TAKE 1 TABLET TWICE DAILY

## 2024-08-13 NOTE — Patient Instructions (Signed)
 VISIT SUMMARY:  You had a follow-up appointment to discuss your severe COPD, cavitary lung lesion, and other health concerns. You reported improvement in your breathing and less difficulty while at rest. You also mentioned your plans to travel out of the country and your concerns about maintaining your medication supply during this period.  YOUR PLAN:  -CHRONIC OBSTRUCTIVE PULMONARY DISEASE (COPD): COPD is a chronic lung disease that makes it hard to breathe. You reported feeling better with less difficulty in breathing and no respiratory distress while at rest. You should continue using Trelegy 200 mcg as it is effective for you. Samples of Trelegy were provided for your upcoming travel, and your prescription will be kept up to date. You are encouraged to continue your smoking cessation efforts, which have significantly contributed to your symptom improvement.  -CAVITARY LUNG LESION: A cavitary lung lesion is an area of the lung where tissue has been destroyed, forming a cavity. There were no specific discussions or changes in the management of your cavitary lung lesion during this visit.  INSTRUCTIONS:  Please ensure you have an adequate supply of your medications for your upcoming travel. If you have any concerns or need assistance with your prescriptions while abroad, contact your healthcare provider. Continue to follow your current treatment plan and maintain your smoking cessation efforts.

## 2024-08-13 NOTE — Telephone Encounter (Signed)
 Tried calling couldn't get through

## 2024-08-13 NOTE — Telephone Encounter (Signed)
 Requested Prescriptions  Pending Prescriptions Disp Refills   gabapentin  (NEURONTIN ) 300 MG capsule [Pharmacy Med Name: GABAPENTIN  300 MG Oral Capsule] 90 capsule 1    Sig: TAKE 1 CAPSULE AT BEDTIME (IN ADDITION TO 600MG  FOR A TOTAL OF 900MG  AT BEDTIME)     Neurology: Anticonvulsants - gabapentin  Passed - 08/13/2024  5:25 PM      Passed - Cr in normal range and within 360 days    Creat  Date Value Ref Range Status  07/09/2023 0.82 0.70 - 1.35 mg/dL Final   Creatinine, Ser  Date Value Ref Range Status  02/13/2024 0.68 0.61 - 1.24 mg/dL Final   Creatinine, Urine  Date Value Ref Range Status  04/22/2023 86 20 - 320 mg/dL Final         Passed - Completed PHQ-2 or PHQ-9 in the last 360 days      Passed - Valid encounter within last 12 months    Recent Outpatient Visits           1 month ago Right ear pain   [place] [facility] [place], Angeline ORN, NP   7 months ago Type 2 diabetes mellitus treated without insulin  [facility])   [place] [facility] [place], Angeline ORN, NP              Refused Prescriptions Disp Refills   spironolactone  (ALDACTONE ) 25 MG tablet [Pharmacy Med Name: SPIRONOLACTONE  25 MG Oral Tablet] 45 tablet 3    Sig: TAKE 1/2 TABLET AS DIRECTED AS NEEDED     Cardiovascular: Diuretics - Aldosterone Antagonist Failed - 08/13/2024  5:25 PM      Failed - Cr in normal range and within 180 days    Creat  Date Value Ref Range Status  07/09/2023 0.82 0.70 - 1.35 mg/dL Final   Creatinine, Ser  Date Value Ref Range Status  02/13/2024 0.68 0.61 - 1.24 mg/dL Final   Creatinine, Urine  Date Value Ref Range Status  04/22/2023 86 20 - 320 mg/dL Final         Failed - K in normal range and within 180 days    Potassium  Date Value Ref Range Status  02/13/2024 4.2 3.5 - 5.1 mmol/L Final         Failed - Na in normal range and within 180 days    Sodium  Date Value Ref Range Status  02/13/2024 135 135 - 145 mmol/L Final  11/14/2020  137 134 - 144 mmol/L Final         Failed - eGFR is 30 or above and within 180 days    GFR, Est African American  Date Value Ref Range Status  12/18/2017 109 > OR = 60 mL/min/1.97m2 Final   GFR, Est Non African American  Date Value Ref Range Status  12/18/2017 94 > OR = 60 mL/min/1.58m2 Final   GFR, Estimated  Date Value Ref Range Status  02/13/2024 >60 >60 mL/min Final    Comment:    (NOTE) Calculated using the CKD-EPI Creatinine Equation (2021)    eGFR  Date Value Ref Range Status  07/09/2023 97 > OR = 60 mL/min/1.51m2 Final  11/14/2020 105 >59 mL/min/1.73 Final         Passed - Last BP in normal range    BP Readings from Last 1 Encounters:  08/13/24 126/80         Passed - Valid encounter within last 6 months    Recent Outpatient Visits  1 month ago Right ear pain   [facility] [place], [place] W, NP   7 months ago Type 2 diabetes mellitus treated without insulin  [facility])   [place] [facility] [place], Angeline ORN, [place]

## 2024-08-13 NOTE — Telephone Encounter (Signed)
 Copied from CRM #[id number]. Topic: General - Other >> Aug 13, 2024 12:15 PM Geneva B wrote: Reason for CRM: mylene calling requesting that pt has kidney health evaluation both urine and blood please call  [phone]

## 2024-08-22 ENCOUNTER — Other Ambulatory Visit: Payer: Self-pay | Admitting: Internal Medicine

## 2024-08-26 NOTE — Telephone Encounter (Signed)
 Too soon for refill, duplicate request.  Requested Prescriptions  Pending Prescriptions Disp Refills   spironolactone  (ALDACTONE ) 25 MG tablet [Pharmacy Med Name: SPIRONOLACTONE  25 MG Oral Tablet] 45 tablet 3    Sig: TAKE 1/2 TABLET AS DIRECTED AS NEEDED     Cardiovascular: Diuretics - Aldosterone Antagonist Failed - 08/26/2024 10:29 AM      Failed - Cr in normal range and within 180 days    Creat  Date Value Ref Range Status  07/09/2023 0.82 0.70 - 1.35 mg/dL Final   Creatinine, Ser  Date Value Ref Range Status  02/13/2024 0.68 0.61 - 1.24 mg/dL Final   Creatinine, Urine  Date Value Ref Range Status  04/22/2023 86 20 - 320 mg/dL Final         Failed - K in normal range and within 180 days    Potassium  Date Value Ref Range Status  02/13/2024 4.2 3.5 - 5.1 mmol/L Final         Failed - Na in normal range and within 180 days    Sodium  Date Value Ref Range Status  02/13/2024 135 135 - 145 mmol/L Final  11/14/2020 137 134 - 144 mmol/L Final         Failed - eGFR is 30 or above and within 180 days    GFR, Est African American  Date Value Ref Range Status  12/18/2017 109 > OR = 60 mL/min/1.88m2 Final   GFR, Est Non African American  Date Value Ref Range Status  12/18/2017 94 > OR = 60 mL/min/1.23m2 Final   GFR, Estimated  Date Value Ref Range Status  02/13/2024 >60 >60 mL/min Final    Comment:    (NOTE) Calculated using the CKD-EPI Creatinine Equation (2021)    eGFR  Date Value Ref Range Status  07/09/2023 97 > OR = 60 mL/min/1.74m2 Final  11/14/2020 105 >59 mL/min/1.73 Final         Passed - Last BP in normal range    BP Readings from Last 1 Encounters:  08/13/24 126/80         Passed - Valid encounter within last 6 months    Recent Outpatient Visits           2 months ago Right ear pain   [facility] [place], Angeline ORN, NP   7 months ago Type 2 diabetes mellitus treated without insulin  [facility])   [place] [facility] [place], Angeline ORN, NP

## 2024-09-07 ENCOUNTER — Other Ambulatory Visit: Payer: Self-pay | Admitting: Internal Medicine

## 2024-09-08 NOTE — Telephone Encounter (Signed)
 Requested Prescriptions  Pending Prescriptions Disp Refills   DULoxetine  (CYMBALTA ) 60 MG capsule [Pharmacy Med Name: DULOXETINE  HYDROCHLORIDEDR 60 MG Oral Capsule Delayed Release Particles] 90 capsule 0    Sig: TAKE 1 CAPSULE EVERY DAY     Psychiatry: Antidepressants - SNRI - duloxetine  Failed - 09/08/2024 12:29 PM      Failed - Valid encounter within last 6 months    Recent Outpatient Visits           2 months ago Right ear pain   [facility] [place], Angeline ORN, NP   8 months ago Type 2 diabetes mellitus treated without insulin  [facility])   [place] [facility] [place], [place] W, NP              Passed - Cr in normal range and within 360 days    Creat  Date Value Ref Range Status  07/09/2023 0.82 0.70 - 1.35 mg/dL Final   Creatinine, Ser  Date Value Ref Range Status  02/13/2024 0.68 0.61 - 1.24 mg/dL Final   Creatinine, Urine  Date Value Ref Range Status  04/22/2023 86 20 - 320 mg/dL Final         Passed - eGFR is 30 or above and within 360 days    GFR, Est African American  Date Value Ref Range Status  12/18/2017 109 > OR = 60 mL/min/1.41m2 Final   GFR, Est Non African American  Date Value Ref Range Status  12/18/2017 94 > OR = 60 mL/min/1.40m2 Final   GFR, Estimated  Date Value Ref Range Status  02/13/2024 >60 >60 mL/min Final    Comment:    (NOTE) Calculated using the CKD-EPI Creatinine Equation (2021)    eGFR  Date Value Ref Range Status  07/09/2023 97 > OR = 60 mL/min/1.77m2 Final  11/14/2020 105 >59 mL/min/1.73 Final         Passed - Completed PHQ-2 or PHQ-9 in the last 360 days      Passed - Last BP in normal range    BP Readings from Last 1 Encounters:  08/13/24 126/80          atorvastatin  (LIPITOR ) 80 MG tablet [Pharmacy Med Name: ATORVASTATIN  CALCIUM  80 MG Oral Tablet] 90 tablet 0    Sig: TAKE 1 TABLET EVERY DAY     Cardiovascular:  Antilipid - Statins Failed - 09/08/2024 12:29 PM      Failed - Lipid  Panel in normal range within the last 12 months    Cholesterol, Total  Date Value Ref Range Status  12/23/2015 217 (H) 100 - 199 mg/dL Final   Cholesterol  Date Value Ref Range Status  01/07/2024 157 <200 mg/dL Final   LDL Cholesterol (Calc)  Date Value Ref Range Status  01/07/2024 98 mg/dL (calc) Final    Comment:    Reference range: <100 . Desirable range <100 mg/dL for primary prevention;   <70 mg/dL for patients with CHD or diabetic patients  with > or = 2 CHD risk factors. SABRA LDL-C is now calculated using the Martin-Hopkins  calculation, which is a validated novel method providing  better accuracy than the Friedewald equation in the  estimation of LDL-C.  Gladis APPLETHWAITE et al. SANDREA. 7986;689(80): 2061-2068  (http://education.[employer].com/faq/FAQ164)    HDL  Date Value Ref Range Status  01/07/2024 36 (L) > OR = 40 mg/dL Final  95/78/7982 29 (L) >39 mg/dL Final   Triglycerides  Date Value Ref Range Status  01/07/2024 126 <  150 mg/dL Final         Passed - Patient is not pregnant      Passed - Valid encounter within last 12 months    Recent Outpatient Visits           2 months ago Right ear pain   [place] [place], [place] W, NP   8 months ago Type 2 diabetes mellitus treated without insulin  [facility])   [facility] [place], Angeline ORN, [place]

## 2024-09-29 ENCOUNTER — Other Ambulatory Visit: Payer: Self-pay | Admitting: Internal Medicine

## 2024-09-29 NOTE — Telephone Encounter (Signed)
 Requested medications are due for refill today.  yes  Requested medications are on the active medications list.  yes  Last refill. 09/08/2024 #30 0 rf  Future visit scheduled.   no  Notes to clinic.  Pt already given a courtesy refill. Pt last seen for chronic conditions 01/07/2024    Requested Prescriptions  Pending Prescriptions Disp Refills   DULoxetine  (CYMBALTA ) 60 MG capsule [Pharmacy Med Name: DULOXETINE  HYDROCHLORIDEDR 60 MG Oral Capsule Delayed Release Particles] 30 capsule 11    Sig: TAKE 1 CAPSULE EVERY DAY     Psychiatry: Antidepressants - SNRI - duloxetine  Failed - 09/29/2024  5:30 PM      Failed - Valid encounter within last 6 months    Recent Outpatient Visits           3 months ago Right ear pain   [place] [facility] [place], [place] W, NP   8 months ago Type 2 diabetes mellitus treated without insulin  [facility])   [facility] [place], [place] W, NP              Passed - Cr in normal range and within 360 days    Creat  Date Value Ref Range Status  07/09/2023 0.82 0.70 - 1.35 mg/dL Final   Creatinine, Ser  Date Value Ref Range Status  02/13/2024 0.68 0.61 - 1.24 mg/dL Final   Creatinine, Urine  Date Value Ref Range Status  04/22/2023 86 20 - 320 mg/dL Final         Passed - eGFR is 30 or above and within 360 days    GFR, Est African American  Date Value Ref Range Status  12/18/2017 109 > OR = 60 mL/min/1.26m2 Final   GFR, Est Non African American  Date Value Ref Range Status  12/18/2017 94 > OR = 60 mL/min/1.21m2 Final   GFR, Estimated  Date Value Ref Range Status  02/13/2024 >60 >60 mL/min Final    Comment:    (NOTE) Calculated using the CKD-EPI Creatinine Equation (2021)    eGFR  Date Value Ref Range Status  07/09/2023 97 > OR = 60 mL/min/1.75m2 Final  11/14/2020 105 >59 mL/min/1.73 Final         Passed - Completed PHQ-2 or PHQ-9 in the last 360 days      Passed - Last BP in normal range    BP  Readings from Last 1 Encounters:  08/13/24 126/80

## 2025-02-11 ENCOUNTER — Ambulatory Visit: Admitting: Pulmonary Disease

## 2025-02-11 ENCOUNTER — Encounter

## 2025-03-10 ENCOUNTER — Ambulatory Visit

## 2025-03-12 ENCOUNTER — Ambulatory Visit

## 2025-04-08 ENCOUNTER — Encounter (INDEPENDENT_AMBULATORY_CARE_PROVIDER_SITE_OTHER)

## 2025-04-08 ENCOUNTER — Ambulatory Visit (INDEPENDENT_AMBULATORY_CARE_PROVIDER_SITE_OTHER): Admitting: Vascular Surgery
